# Patient Record
Sex: Male | Born: 1945 | Race: White | Hispanic: No | Smoking: Current every day smoker
Health system: Southern US, Community
[De-identification: ages and names within clinical notes are randomized; demographics above are authoritative.]

## PROBLEM LIST (undated history)

## (undated) DIAGNOSIS — I10 Essential (primary) hypertension: Secondary | ICD-10-CM

## (undated) DIAGNOSIS — D649 Anemia, unspecified: Secondary | ICD-10-CM

## (undated) DIAGNOSIS — Z720 Tobacco use: Secondary | ICD-10-CM

## (undated) DIAGNOSIS — I2510 Atherosclerotic heart disease of native coronary artery without angina pectoris: Secondary | ICD-10-CM

## (undated) DIAGNOSIS — K219 Gastro-esophageal reflux disease without esophagitis: Secondary | ICD-10-CM

## (undated) DIAGNOSIS — K5790 Diverticulosis of intestine, part unspecified, without perforation or abscess without bleeding: Secondary | ICD-10-CM

## (undated) DIAGNOSIS — J449 Chronic obstructive pulmonary disease, unspecified: Secondary | ICD-10-CM

## (undated) DIAGNOSIS — F32A Depression, unspecified: Secondary | ICD-10-CM

## (undated) DIAGNOSIS — Z860100 Personal history of colon polyps, unspecified: Secondary | ICD-10-CM

## (undated) DIAGNOSIS — E875 Hyperkalemia: Secondary | ICD-10-CM

## (undated) DIAGNOSIS — K922 Gastrointestinal hemorrhage, unspecified: Secondary | ICD-10-CM

## (undated) DIAGNOSIS — M549 Dorsalgia, unspecified: Secondary | ICD-10-CM

## (undated) DIAGNOSIS — M419 Scoliosis, unspecified: Secondary | ICD-10-CM

## (undated) DIAGNOSIS — M1990 Unspecified osteoarthritis, unspecified site: Secondary | ICD-10-CM

## (undated) DIAGNOSIS — Z8719 Personal history of other diseases of the digestive system: Secondary | ICD-10-CM

## (undated) DIAGNOSIS — E785 Hyperlipidemia, unspecified: Secondary | ICD-10-CM

## (undated) DIAGNOSIS — F329 Major depressive disorder, single episode, unspecified: Secondary | ICD-10-CM

## (undated) HISTORY — DX: Chronic obstructive pulmonary disease, unspecified: J44.9

## (undated) HISTORY — PX: BACK SURGERY: SHX140

## (undated) HISTORY — DX: Atherosclerotic heart disease of native coronary artery without angina pectoris: I25.10

## (undated) HISTORY — DX: Dorsalgia, unspecified: M54.9

## (undated) HISTORY — PX: CORONARY ANGIOPLASTY: SHX604

## (undated) HISTORY — DX: Anemia, unspecified: D64.9

## (undated) HISTORY — PX: CORONARY ARTERY BYPASS GRAFT: SHX141

## (undated) HISTORY — DX: Essential (primary) hypertension: I10

## (undated) HISTORY — DX: Tobacco use: Z72.0

---
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
## 1971-10-15 HISTORY — PX: FOOT SURGERY: SHX648

## 1997-10-14 HISTORY — PX: CARDIAC CATHETERIZATION: SHX172

## 1998-08-21 ENCOUNTER — Inpatient Hospital Stay (HOSPITAL_COMMUNITY): Admission: EM | Admit: 1998-08-21 | Discharge: 1998-08-23 | Payer: Self-pay | Admitting: Emergency Medicine

## 1998-08-21 ENCOUNTER — Encounter: Payer: Self-pay | Admitting: Internal Medicine

## 2000-12-12 ENCOUNTER — Ambulatory Visit (HOSPITAL_COMMUNITY): Admission: RE | Admit: 2000-12-12 | Discharge: 2000-12-12 | Payer: Self-pay | Admitting: Cardiology

## 2006-01-21 ENCOUNTER — Ambulatory Visit: Payer: Self-pay | Admitting: Pain Medicine

## 2006-02-04 ENCOUNTER — Ambulatory Visit: Payer: Self-pay | Admitting: Pain Medicine

## 2006-02-18 ENCOUNTER — Ambulatory Visit: Payer: Self-pay | Admitting: Pain Medicine

## 2006-03-05 ENCOUNTER — Ambulatory Visit: Payer: Self-pay | Admitting: Physician Assistant

## 2006-05-27 ENCOUNTER — Other Ambulatory Visit: Payer: Self-pay

## 2006-05-27 ENCOUNTER — Emergency Department: Payer: Self-pay | Admitting: Emergency Medicine

## 2006-12-09 ENCOUNTER — Inpatient Hospital Stay (HOSPITAL_COMMUNITY): Admission: EM | Admit: 2006-12-09 | Discharge: 2006-12-10 | Payer: Self-pay | Admitting: Emergency Medicine

## 2006-12-22 ENCOUNTER — Inpatient Hospital Stay (HOSPITAL_BASED_OUTPATIENT_CLINIC_OR_DEPARTMENT_OTHER): Admission: RE | Admit: 2006-12-22 | Discharge: 2006-12-22 | Payer: Self-pay | Admitting: Cardiology

## 2007-03-18 ENCOUNTER — Encounter: Admission: RE | Admit: 2007-03-18 | Discharge: 2007-03-18 | Payer: Self-pay | Admitting: Cardiology

## 2008-10-14 HISTORY — PX: CARDIAC CATHETERIZATION: SHX172

## 2009-01-20 ENCOUNTER — Inpatient Hospital Stay (HOSPITAL_BASED_OUTPATIENT_CLINIC_OR_DEPARTMENT_OTHER): Admission: RE | Admit: 2009-01-20 | Discharge: 2009-01-20 | Payer: Self-pay | Admitting: Cardiology

## 2010-02-15 ENCOUNTER — Inpatient Hospital Stay: Payer: Self-pay | Admitting: Internal Medicine

## 2010-04-01 ENCOUNTER — Inpatient Hospital Stay: Payer: Self-pay | Admitting: Internal Medicine

## 2010-04-19 ENCOUNTER — Ambulatory Visit: Payer: Self-pay | Admitting: Gastroenterology

## 2010-05-09 ENCOUNTER — Ambulatory Visit: Payer: Self-pay | Admitting: Internal Medicine

## 2010-06-25 ENCOUNTER — Ambulatory Visit: Payer: Self-pay | Admitting: Cardiology

## 2010-09-16 IMAGING — CT CT HEAD WITHOUT CONTRAST
2 of 4 series · 16 of 30 positions shown, 19 images · non-contrast
Comparison: none

REASON FOR EXAM: lethargy; HA
COMMENTS:

PROCEDURE:     CT  - CT HEAD WITHOUT CONTRAST  - April 01, 2010 [DATE]
RESULT:     Technique: Helical 5mm sections were obtained from the skull
base to the vertex without administration of intravenous contrast.

[Series 2: without · axial · non-contrast · 0.43mm/px · z∈[-91,+29]mm · 9 of 32 slices shown, 12 images]
[im 4/32  brain]
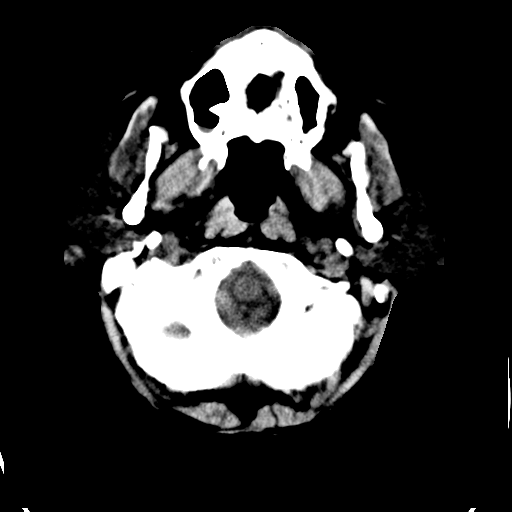
[im 4/32  bone]
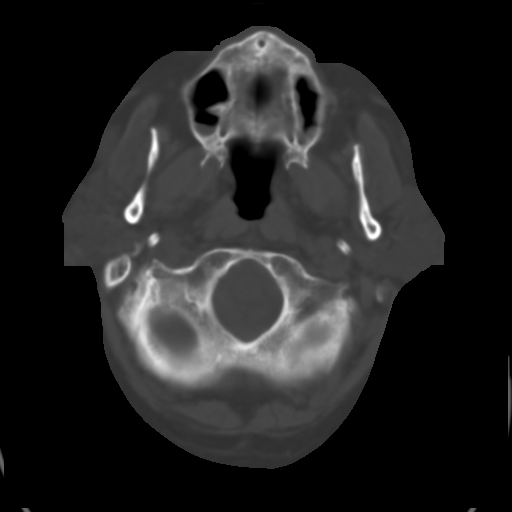
[im 7/32  brain]
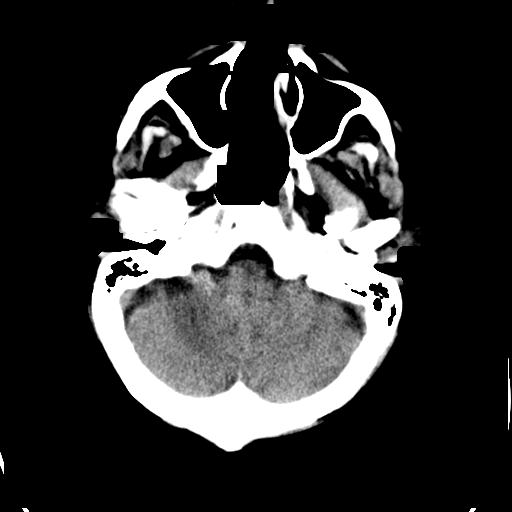
[im 10/32  brain]
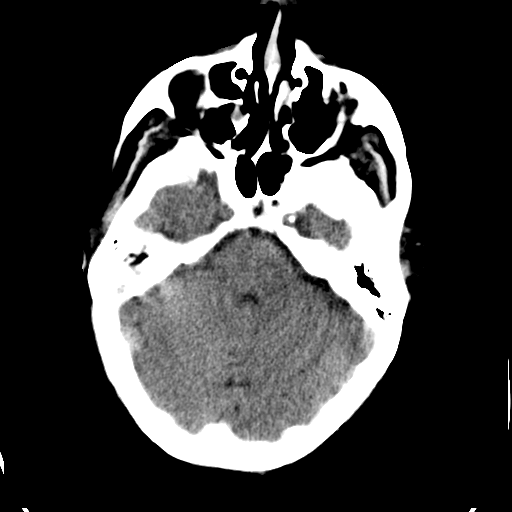
[im 13/32  brain]
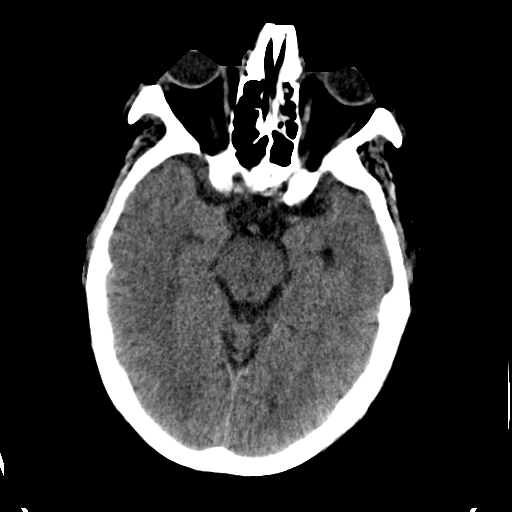
[im 16/32  brain]
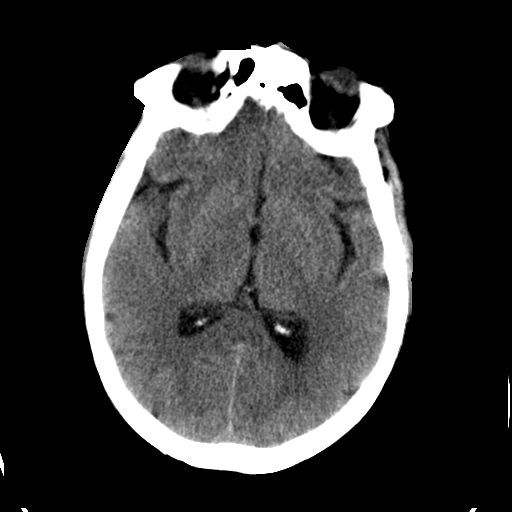
[im 16/32  bone]
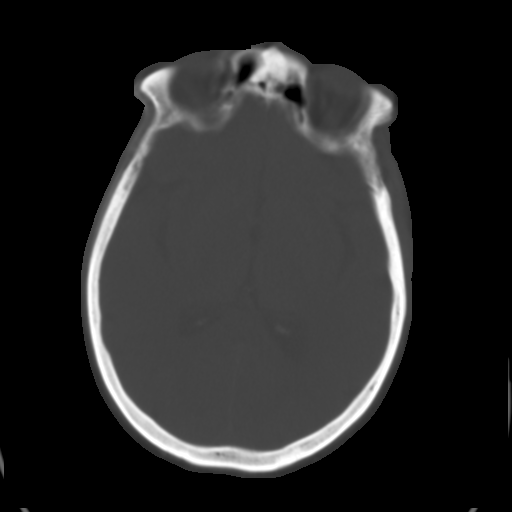
[im 19/32  brain]
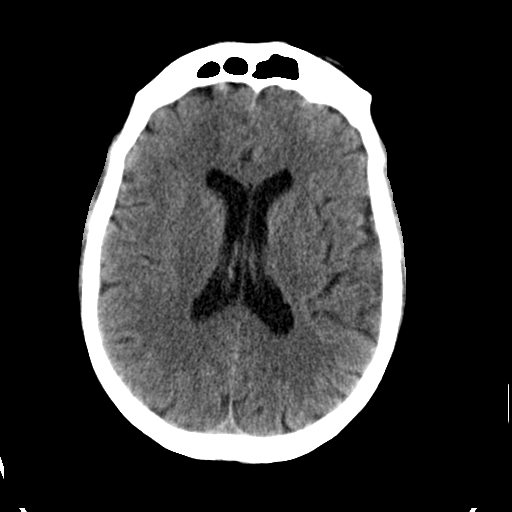
[im 22/32  brain]
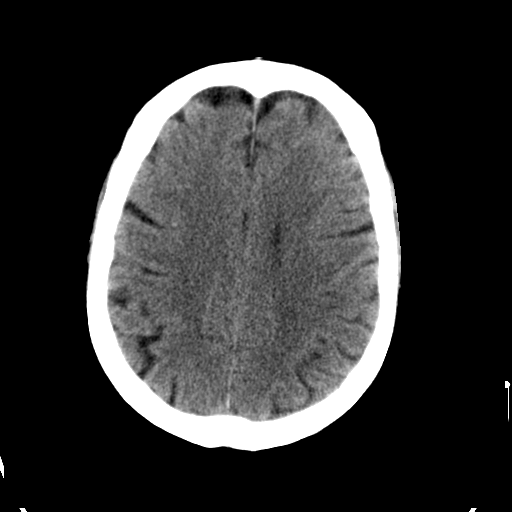
[im 25/32  brain]
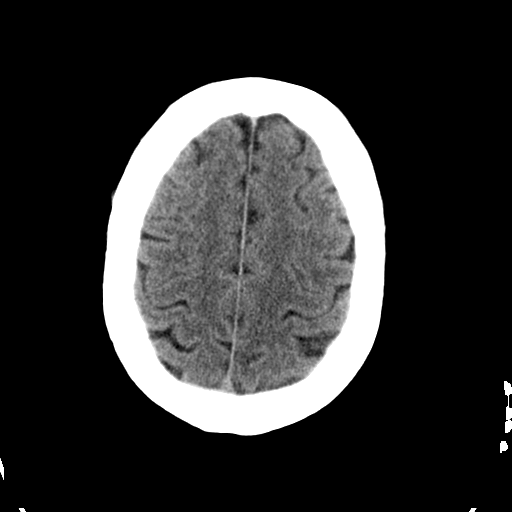
[im 28/32  brain]
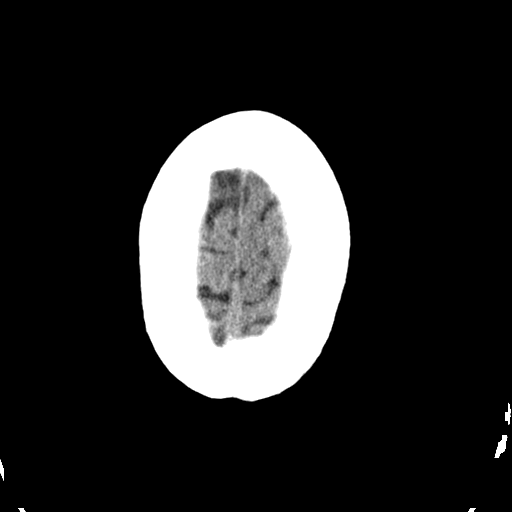
[im 28/32  bone]
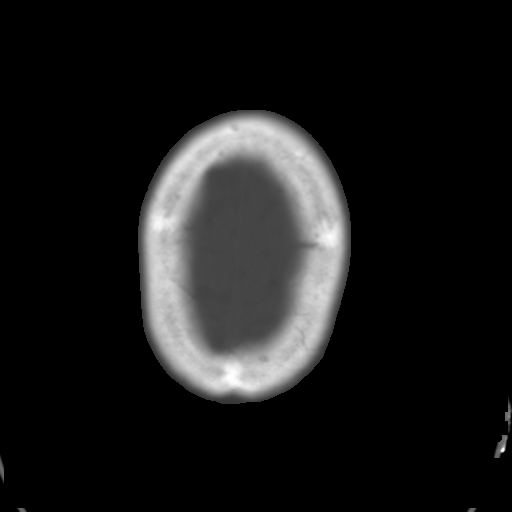

[Series 3: bone · axial · 0.43mm/px · z∈[-91,+14]mm · 7 of 32 slices shown]
[im 4/32  bone]
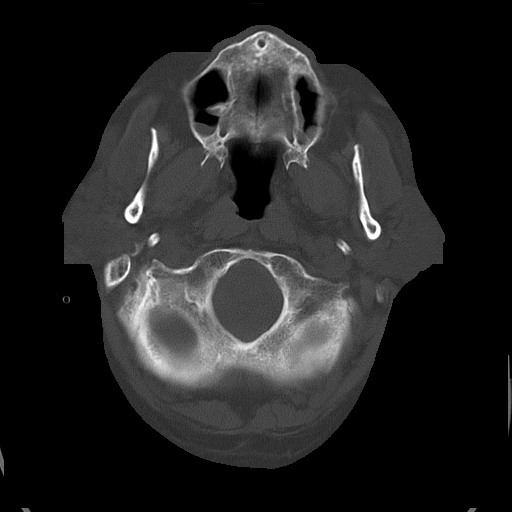
[im 7/32  bone]
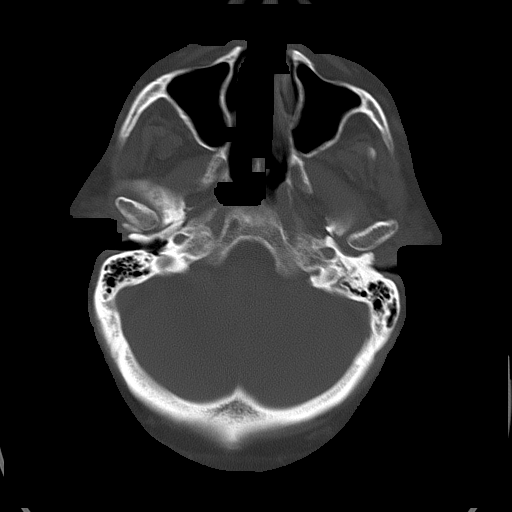
[im 10/32  bone]
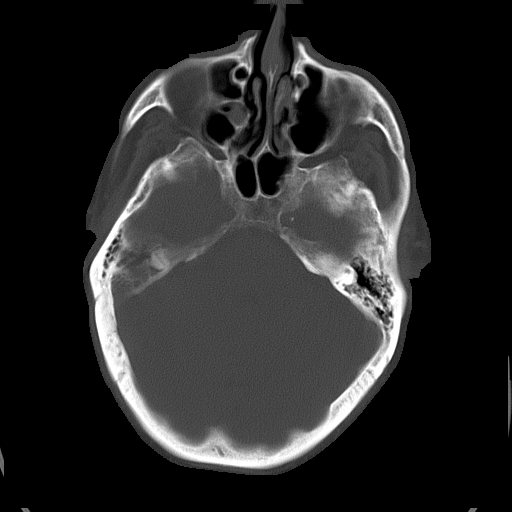
[im 13/32  bone]
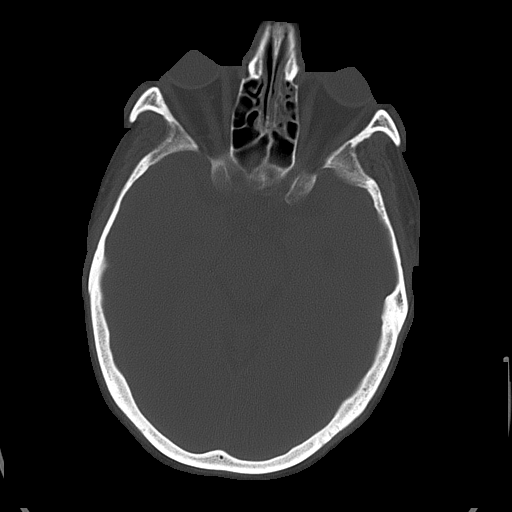
[im 19/32  bone]
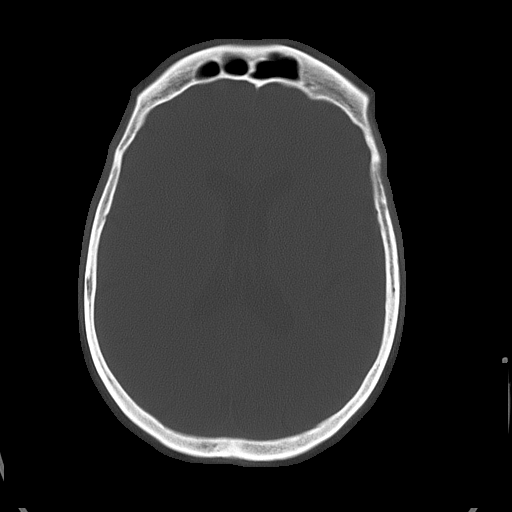
[im 22/32  bone]
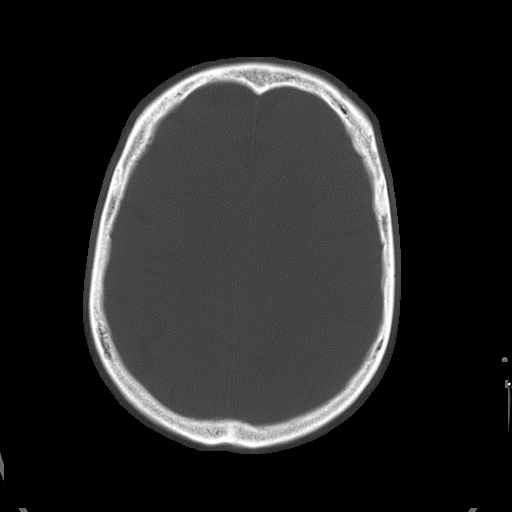
[im 25/32  bone]
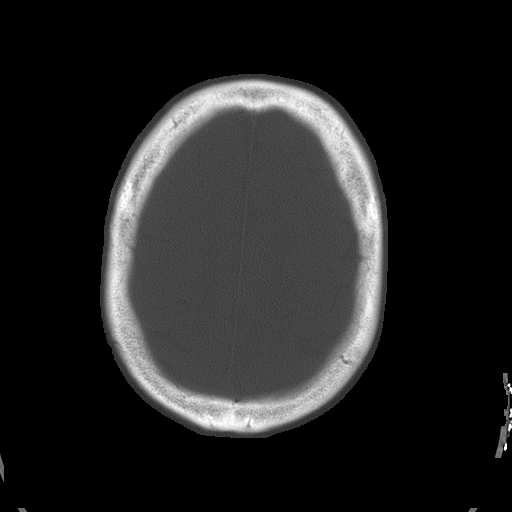

[16 of 30 positions shown; findings below may reference images not displayed]

FINDINGS: There is not evidence of intra-axial fluid collections. There is
no evidence of acute hemorrhage or secondary signs reflecting mass effect or
subacute or chronic focal territorial infarction. The osseous structures
demonstrate no evidence of a depressed skull fracture. If there is
persistent concern clinical follow-up with MRI is recommended.
IMPRESSION: 1. No evidence of acute intracranial abnormalitites.

## 2010-10-04 ENCOUNTER — Ambulatory Visit: Payer: Self-pay | Admitting: Cardiology

## 2010-10-24 IMAGING — CT CT CHEST W/ CM
1 of 2 series · 14 of 29 positions shown, 18 images · IV contrast (agent unspecified)
Comparison: 02/21/2010

REASON FOR EXAM: SOB   Pneumonia   follow up
COMMENTS:

PROCEDURE:     KCT - KCT CHEST WITH CONTRAST  - May 09, 2010 [DATE]
RESULT:     Indication: Shortness of breath, pneumonia
TECHNIQUE: Multiple axial images of the chest are obtained with 75 mL of
Ssovue-ZMV intravenous contrast.

[Series 2: chest w/ 5.0 i41f · axial · 0.81mm/px · z∈[-246,+58]mm · 14 of 73 slices shown, 18 images]
[im 6/73  mediastinal]
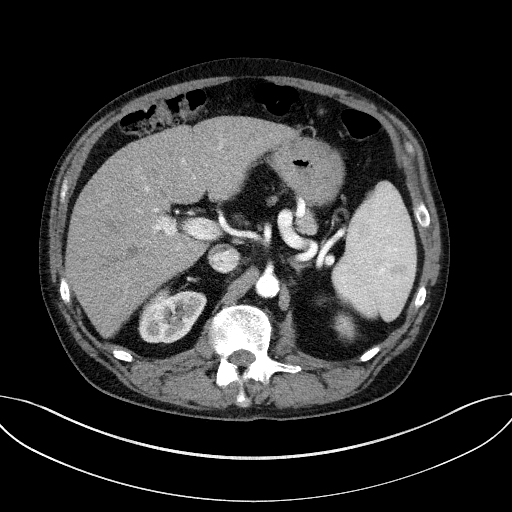
[im 6/73  lung]
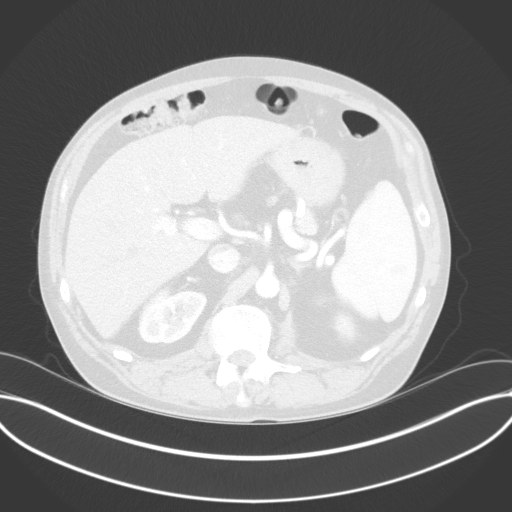
[im 11/73  lung]
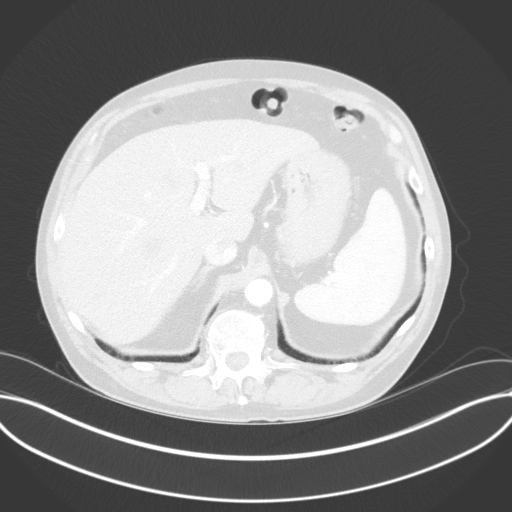
[im 16/73  lung]
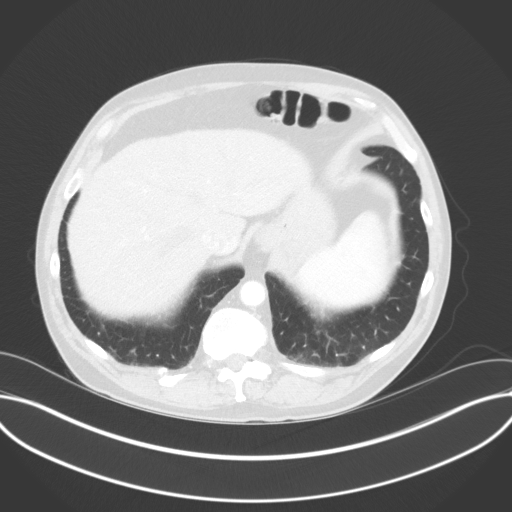
[im 21/73  lung]
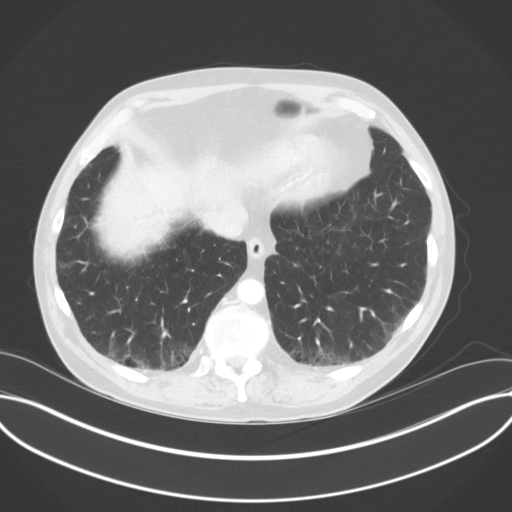
[im 26/73  mediastinal]
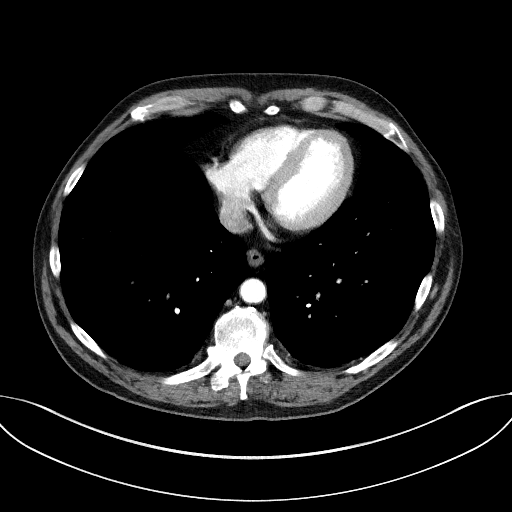
[im 26/73  lung]
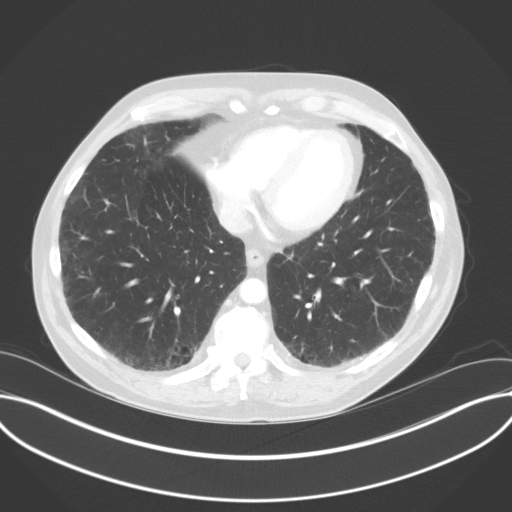
[im 31/73  lung]
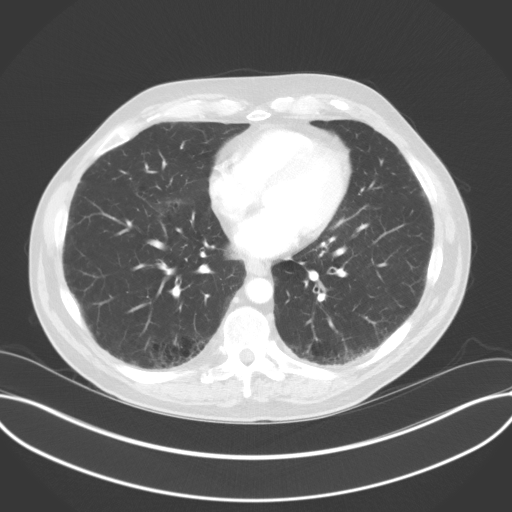
[im 35/73  lung]
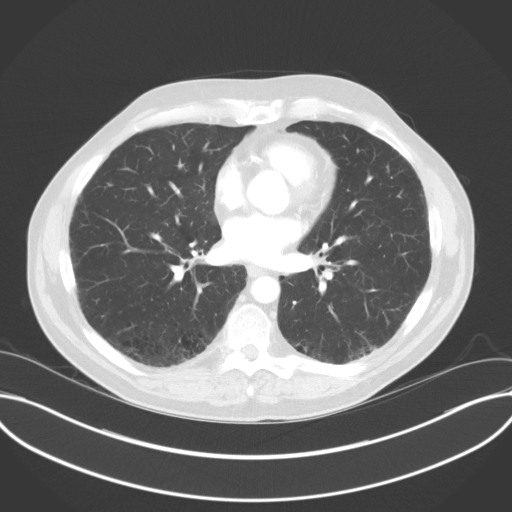
[im 37/73  lung]
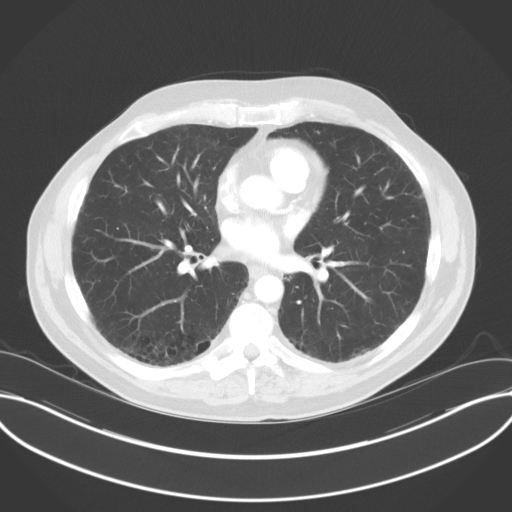
[im 42/73  mediastinal]
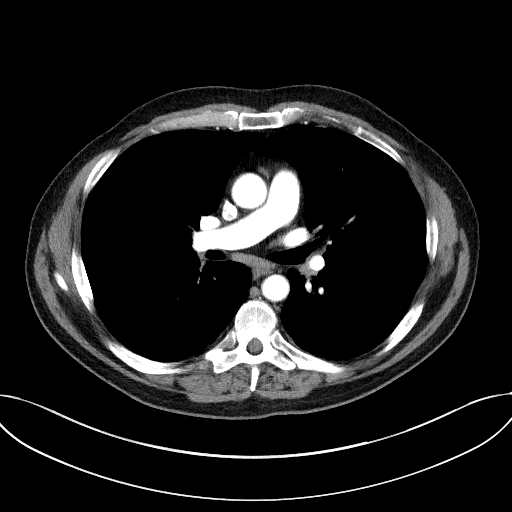
[im 42/73  lung]
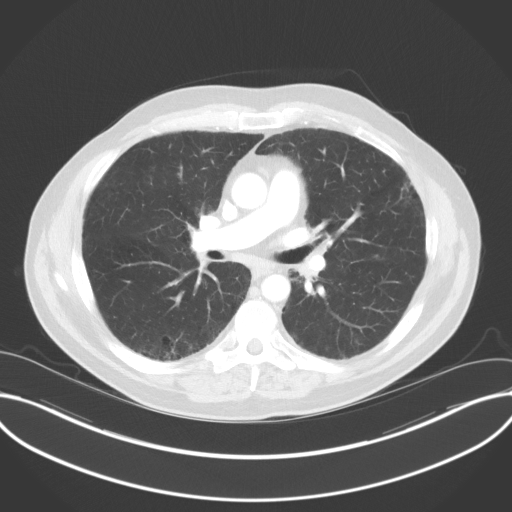
[im 47/73  lung]
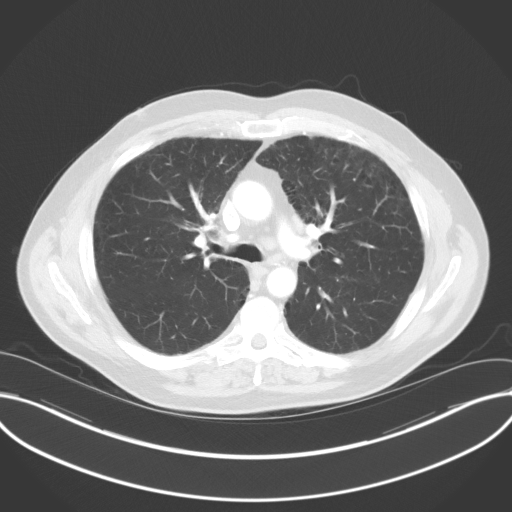
[im 52/73  lung]
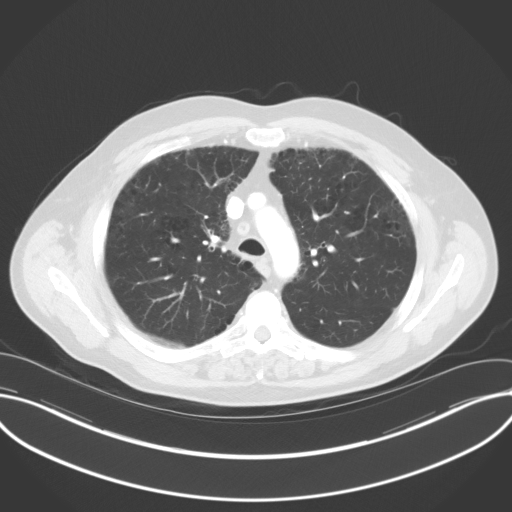
[im 57/73  lung]
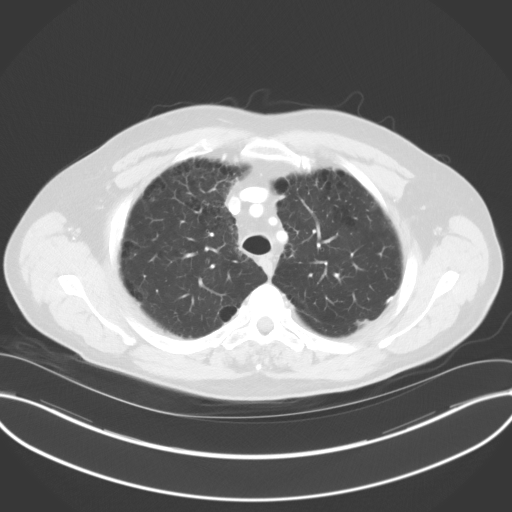
[im 62/73  mediastinal]
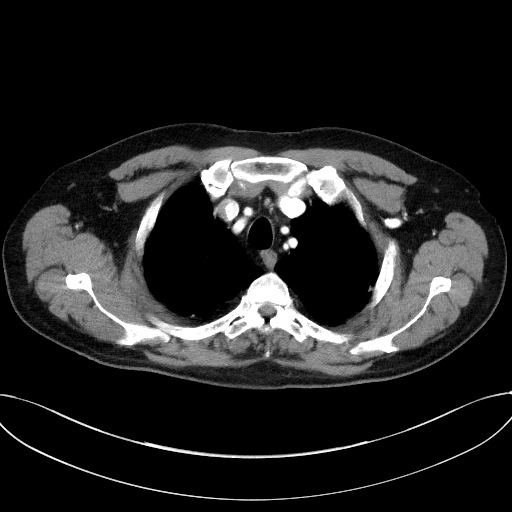
[im 62/73  lung]
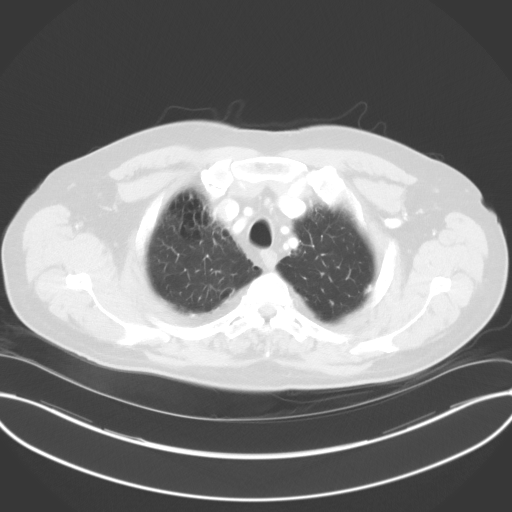
[im 67/73  lung]
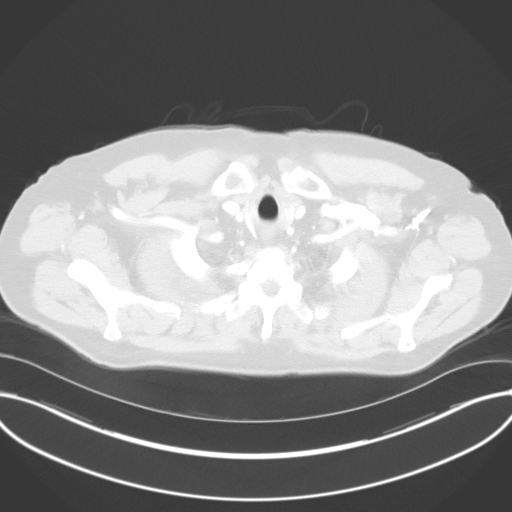

[14 of 29 positions shown; findings below may reference images not displayed]

FINDINGS: The central airways are patent. There are bibasilar posterior subpleural
cystic changes. There are biapical blebs noted. There is bilateral mild
interlobular and intralobular septal thickening. There is no pleural
effusion or pneumothorax.

There multiple nonpathologic enlarged mediastinal lymph nodes which are
improved compared with the prior exam.

The heart size is normal. There is no pericardial effusion. The thoracic
aorta is normal in caliber.  There is coronary artery atherosclerosis
involving the left main, LAD and right coronary artery.

Review of bone windows demonstrates no focal lytic or sclerotic lesions.
There is an S-shaped scoliosis of the thoracolumbar spine.

Limited noncontrast images of the upper abdomen were obtained. The adrenal
glands appear normal. The remainder of the visualized abdominal organs are
unremarkable.
IMPRESSION: 1. Mild chronic interstitial lung disease.
2. No focal consolidation to suggest pneumonia.

## 2010-11-22 ENCOUNTER — Other Ambulatory Visit (HOSPITAL_COMMUNITY): Payer: Self-pay | Admitting: Neurological Surgery

## 2010-11-22 DIAGNOSIS — M545 Low back pain: Secondary | ICD-10-CM

## 2010-11-30 ENCOUNTER — Ambulatory Visit (HOSPITAL_COMMUNITY)
Admission: RE | Admit: 2010-11-30 | Discharge: 2010-11-30 | Disposition: A | Discharge status: Home / Self Care | Payer: Medicare Other | Source: Ambulatory Visit | Attending: Neurological Surgery | Admitting: Neurological Surgery

## 2010-11-30 ENCOUNTER — Other Ambulatory Visit (HOSPITAL_COMMUNITY): Payer: Self-pay | Admitting: Neurological Surgery

## 2010-11-30 DIAGNOSIS — M545 Low back pain: Secondary | ICD-10-CM

## 2010-11-30 DIAGNOSIS — M4120 Other idiopathic scoliosis, site unspecified: Secondary | ICD-10-CM | POA: Insufficient documentation

## 2010-11-30 DIAGNOSIS — M48061 Spinal stenosis, lumbar region without neurogenic claudication: Secondary | ICD-10-CM | POA: Insufficient documentation

## 2010-11-30 MED ORDER — IOHEXOL 180 MG/ML  SOLN
20.0000 mL | Freq: Once | INTRAMUSCULAR | Status: AC | PRN
  Administered 2010-11-30: 20 mL via INTRAVENOUS

## 2011-02-05 ENCOUNTER — Other Ambulatory Visit: Payer: Self-pay | Admitting: Cardiology

## 2011-02-05 DIAGNOSIS — F329 Major depressive disorder, single episode, unspecified: Secondary | ICD-10-CM

## 2011-02-05 DIAGNOSIS — K219 Gastro-esophageal reflux disease without esophagitis: Secondary | ICD-10-CM

## 2011-02-05 NOTE — Telephone Encounter (Signed)
Out of Nexipro and Lexum and needs refills. [facility] [place]. Also wants know who the new doctor His back has been hurting (has a s-curvature) and has had intense headaches, has been maxing out 4000mg  of tylenol and is concerned about over working his liver. Please call back. I have pulled the chart.

## 2011-02-06 ENCOUNTER — Telehealth: Payer: Self-pay | Admitting: Cardiology

## 2011-02-06 MED ORDER — ESCITALOPRAM OXALATE 20 MG PO TABS: 20.0000 mg | ORAL_TABLET | Freq: Every day | ORAL | Status: DC

## 2011-02-06 MED ORDER — ESOMEPRAZOLE MAGNESIUM 40 MG PO CPDR: 40.0000 mg | DELAYED_RELEASE_CAPSULE | Freq: Every day | ORAL | Status: DC

## 2011-02-06 NOTE — Telephone Encounter (Signed)
Spoke with Dr. Deborah Chalk regarding pt trying something else other than Tylenol for back pain.  Dr. Deborah Chalk suggested pt try Tramadol.  RN left message for pt to try Tramadol and to get it from Dr. Danielle Dess.  Pt to call with any concerns.

## 2011-02-06 NOTE — Telephone Encounter (Signed)
RN clarified instructions with pharmacy.

## 2011-02-06 NOTE — Telephone Encounter (Signed)
NEEDS TO  QUAINTY 30 AND DAYS SUPPLY IS 90 NEEDS TO KNOW WHICH IT IS FAX [phone]

## 2011-02-06 NOTE — Telephone Encounter (Signed)
Spoke with pt, refills sent in.

## 2011-02-06 NOTE — Telephone Encounter (Signed)
Returning call regarding scripts

## 2011-02-06 NOTE — Telephone Encounter (Signed)
RN refilled Nexium and Lexapro for pt.  Pt aware.

## 2011-02-26 NOTE — H&P (Signed)
NAMESHARMARKE, CICIO                ACCOUNT NO.:  [id number]   MEDICAL RECORD NO.:  [id number]           PATIENT TYPE:   LOCATION:                                 FACILITY:   PHYSICIAN:  Colleen Can. Deborah Chalk, M.D.    DATE OF BIRTH:   DATE OF ADMISSION:  01/20/2009  DATE OF DISCHARGE:                              HISTORY & PHYSICAL   CHIEF COMPLAINT:  Chest pain.   HISTORY OF PRESENT ILLNESS:  [name] is a 65 year old white male who has a  known history of ischemic heart disease.  He presents for repeat cardiac  catheterization due to a severe episode of chest pain this past Sunday.  He does have known ischemic heart disease with mild left main stenosis.   He was seen as a work-in appointment on January 17, 2009 and at that time  reported on the previous Easter Sunday at about 12:30 in the afternoon,  he had significant chest pain.  It radiated up into the tongue as well  as into his teeth.  His left arm went numb.  He took aspirin.  He did  not have any nitroglycerin.  This was associated with feeling queasy.  He had no actual vomiting.  He was sweaty and clammy.  His daughter  notes that he was a little disoriented.  He basically went to bed and  slept for about 20 hours.  He has had no recurrence since that time.  He  now presents for repeat cardiac catheterization.   PAST MEDICAL HISTORY:  1. Known ischemic heart disease.  He has had previous stent placement      to the right coronary in November 1999 and has a known residual      left main disease.  His last catheterization was in March 2008      which showed normal LV function, patent stent in the right      coronary, and mild distal left main stenosis with mild disease of      small left anterior descending.  2. Ongoing tobacco abuse.  3. Hyperlipidemia.  4. Hypertension.  5. Scoliosis with chronic back pain requiring chronic narcotics.  6. History of an abnormal CT scan with subsequent resolution in 2008.  7. History of  appendectomy.  8. Left foot surgery.   ALLERGIES:  None known.   CURRENT MEDICATIONS:  1. Zocor 40 mg a day.  2. Aspirin daily.  3. Diovan 320/25 daily.  4. Nexium 40 mg a day.  5. Lexapro 20 mg a day.  6. Percocet up to 6-8 tablets a day.  7. Inhalers daily.   FAMILY HISTORY:  His father died at 74 due to Alzheimer's and there was  a questionable history of unstable angina.   SOCIAL HISTORY:  He is divorced.  He has 3 daughters.  He is smoking  about 2 packs cigarettes a day.  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   REVIEW OF SYSTEMS:  He is not really sure if he has had any previous  episodes of chest pain prior to this last episode.  He has significant  back pain and does require chronic narcotics.  He tries to stay active.  He has had no recent fever, flu, or cough.  He does continue to smoke.  He notes his bowel habits are unremarkable.  GU system is unremarkable.  All other review of systems are negative.   PHYSICAL EXAMINATION:  GENERAL:  He is pleasant.  He is in no acute  distress.  He is currently pain free.  VITAL SIGNS:  His blood pressure is 138/76 sitting and standing.  Heart  rate is 62 and regular.  His weight is down 10 pounds to 166.  SKIN:  Warm and dry.  Color is unremarkable.  HEENT:  He is normocephalic and atraumatic.  Pupils are equal, round,  and reactive to light.  Conjunctivae are normal.  NECK:  Supple.  No masses.  No JVD.  LUNGS:  Coarse.  He is not overtly short of breath at rest.  CARDIAC:  Regular rhythm.  He has no chest wall tenderness.  ABDOMEN:  Soft, positive bowel sounds, and nontender.  EXTREMITIES:  Without edema.  MUSCULOSKELETAL:  Gait, strength, and range of motion to be intact.  NEUROLOGIC:  No gross focal deficits.   LABORATORY DATA:  His EKG shows sinus rhythm with no acute changes.   Other labs are pending.   OVERALL IMPRESSION:  1. Chest pain.  2. Known  ischemic heart disease.  3. Ongoing tobacco abuse.  4. Hyperlipidemia.  5. Hypertension.   PLAN:  We will proceed on with diagnostic cardiac catheterization.  The  procedure has been reviewed in full detail and he is willing to proceed  on Friday January 20, 2009.      Sharlee Blew, N.P.      Colleen Can. Deborah Chalk, M.D.  Electronically Signed    LC/MEDQ  D:  01/18/2009  T:  01/18/2009  Job:  [id number]

## 2011-02-26 NOTE — Cardiovascular Report (Signed)
Matthew, Booth                ACCOUNT NO.:  [id number]   MEDICAL RECORD NO.:  [id number]          PATIENT TYPE:  OIB   LOCATION:  1961                         FACILITY:  [facility]   PHYSICIAN:  Colleen Can. Deborah Chalk, M.D.DATE OF BIRTH:  12/05/1945   DATE OF PROCEDURE:  DATE OF DISCHARGE:  01/20/2009                            CARDIAC CATHETERIZATION   Mr. Fails presents with a severe episode of substernal chest pain.  Cardiac enzymes were negative and it was relatively short lived, but it  was severe, radiating to his left arm.  He has had a previous stent in  his right coronary artery and had known mild left main coronary artery  stenosis, has residual.  He is referred for evaluation.   PROCEDURE:  Left heart catheterization with selective coronary  angiography and left ventricular angiography.   TYPE AND SITE OF ENTRY:  Percutaneous right femoral artery.   CATHETERS:  A 4-French 4 curved Judkins left coronary catheter, 4-French  3-D RC right coronary catheter, 4-French pigtail ventriculographic  catheter.   CONTRAST MATERIAL:  Omnipaque.   MEDICATIONS:  Given prior to procedure, Valium 10 mg p.o.   MEDICATIONS:  Given during the procedure, Versed 3 mg IV.   COMMENTS:  The patient tolerated the procedure well.   HEMODYNAMIC DATA:  The aortic pressure was 102/53.  The LV pressure was  99/9.  There is no aortic valve gradient noted on pullback.   ANGIOGRAPHIC DATA:  Left ventricular angiogram was performed in RAO  position.  Overall cardiac size and silhouette are normal.  The global  ejection fraction was 60%.  Regional wall motion is normal.   CORONARY ARTERIES:  1. Right coronary artery.  The right coronary artery is a large      dominant vessel.  There is a stent that is patent proximally.      There are irregularities of 30% or less scattered throughout the      right coronary artery, but there is no narrowing within the body of      the stent.  2. Left circumflex is  normal.  3. There is a small intermediate.  It is normal.  4. Left anterior descending.  The left anterior descending had mild      irregularities and somewhat diffuse 20-30% narrowings.  5. Left main coronary artery.  The left main coronary artery had a      definite 30-40% distal narrowing.  Numerous views of this were      obtained, and it is clear that it has not progressed beyond that      point and does not appear to have significant obstructive quality      to it.   OVERALL IMPRESSION:  1. Mild left main coronary artery distal stenosis.  2. There is mild irregularities and right coronary artery left      anterior descending.  3. Patent stent in the right coronary artery.  4. Normal left ventricular function.   DISCUSSION:  We will encourage [name] to modify his cardiovascular risk  factors.  I do not think he needs an operative procedure at  this point  in time.  I do want to arrange for him to have a followup stress test  and make sure that there is not any functional ischemia related to this  left main coronary artery stenosis that appears mild angiographically.      Colleen Can. Deborah Chalk, M.D.  Electronically Signed     SNT/MEDQ  D:  01/20/2009  T:  01/21/2009  Job:  [id number]

## 2011-03-01 NOTE — H&P (Signed)
[place]  Patient:    [name], [name]                      MRN: [id number] Adm. Date:  12/12/00 Dictator:   Jennet Maduro. Earl Gala, R.N., A.N.P. CCH[facility]ah Pais. Jeannetta Nap, M.D.                         History and Physical  CHIEF COMPLAINT:  Fatigue.  HISTORY OF PRESENT ILLNESS:  Mr. Senne is a very pleasant 65 year old white male who has known atherosclerotic cardiovascular disease.  He has had previous cardiac catheterization with revascularization dating back to November 1999.  At that time he demonstrated a totally occluded right coronary artery with left-to-right collaterals and subsequently had a stent placed.  He has a small left circumflex with a distal left main coronary stenosis of approximately 50% as well as a 60-70% mid narrowing in the LAD.  Following that hospitalization, he has basically done well.  He has done considerable cardiovascular modification and has stopped smoking.  He remains active.  He has no reoccurrence of throat discomfort or chest pain.  He was seen earlier this month for routine treadmill testing.  At that time, he exercised a total of 10 minutes to a maximum of 4.2 miles per hour at 16% grade.  His heart rate increased to 154.  Blood pressure rose to a maximum of 170/60, and the test was stopped due to fatigue, and there was no clinical angina.  His resting electrocardiogram was basically within normal limits.  He did have ST segment depression in the lateral leads with somewhat of upsloping with exercise and it became somewhat flattened at the very peak of exercise and then recovered quite promptly following exercise.  Within one minute post recovery the ST changes were completely back to baseline.  Initially, it was felt that we would follow him along medically.  He has had ongoing bouts of fatigue and, however, due to the fact that he has not had recurrence of chest pain but has a known left main stenosis,  will now elect to proceed on with elective cardiac catheterization.  PAST MEDICAL HISTORY: 1. ASCVD with previous stent placement to the right coronary artery in    November 1999. 2. Past tobacco abuse. 3. Hypercholesterolemia, currently on Zocor. 4. Status post appendectomy. 5. History of left foot surgery.  ALLERGIES:  None known.  CURRENT MEDICATIONS: 1. Zocor 40 mg a day. 2. Aspirin daily.  FAMILY HISTORY:  Father died at 15 due to Alzheimers and there was a questionable history of unstable angina.  SOCIAL HISTORY:  He is currently undergoing divorce proceedings.  He has three daughters.  He has no current smoking history but previously smoked one to one-and-a-half packs a day for approximately 25 years.  There is occasional alcohol use.  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  REVIEW OF SYSTEMS:  Is otherwise as stated above and is unremarkable in its entirety.  PHYSICAL EXAMINATION:  GENERAL:  He is a very pleasant white male in no acute distress.  VITAL SIGNS:  Blood pressure 150/80 sitting, 140/90 standing.  Heart rate is 80 and regular.  Respirations 18.  Weight 162 pounds.  SKIN:  Warm and dry.  Color is unremarkable.  HEENT:  Unremarkable.  LUNGS:  Clear.  HEART:  Shows  a regular rhythm.  ABDOMEN:  Soft, positive bowel sounds, nontender.  EXTREMITIES:  Show no evidence of edema.  PULSES:  Distal pulses are intact bilaterally.  LABORATORY DATA:  Currently pending.  OVERALL IMPRESSION: 1. Known left main stenosis. 2. Previous abnormal exercise tolerance test yet with good exercise tolerance    and clinically negative for symptoms. 3. Hypercholesterolemia. 4. Past tobacco use.  PLAN:  Will proceed on with elective cardiac catheterization.  The procedure is discussed in full detail with the patient and he is willing to proceed on. DD:  12/10/00 TD:  12/10/00 Job: [id number]

## 2011-03-01 NOTE — Cardiovascular Report (Signed)
NAMEDIANGELO, RADEL                ACCOUNT NO.:  [id number]   MEDICAL RECORD NO.:  [id number]          PATIENT TYPE:  OIB   LOCATION:  1963                         FACILITY:  [facility]   PHYSICIAN:  Colleen Can. Deborah Chalk, M.D.DATE OF BIRTH:  11-Jun-1946   DATE OF PROCEDURE:  12/22/2006  DATE OF DISCHARGE:                            CARDIAC CATHETERIZATION   PROCEDURE:  Left heart catheterization with selective coronary  angiography, left ventricular angiography.  Type and site of entry:  Percutaneous right femoral artery. Catheters: 4-French, 4 curved Judkins  right left coronary catheters, 4-French pigtail ventriculographic  catheter.   CONTRAST MATERIAL:  Omnipaque.   MEDICATIONS PRIOR TO PROCEDURE:  Valium 10 mg.   MEDICATIONS DURING PROCEDURE:  Versed 2 mg IV.   COMMENT:  The patient tolerated the procedure well.   HEMODYNAMIC DATA:  The aortic pressure is 121/60, LV was 129/0-7.   Angiographic data:  The left ventricular angiogram was performed in RAO  position.  Overall cardiac size and silhouette were normal.  The global  left ventricular function is normal.  There was only trace mitral  regurgitation, if that.  The ejection fraction is estimated be 60%.   Coronary arteries arise and distribute normally.  The left coronary  system in general is small.  1. His left main coronary artery has 20-30% distal narrowing.  2. The left anterior descending.  The left anterior descending ends on      anterior wall.  There is a second and third diagonal present.      There is 30% narrowing after the second diagonal vessel.  3. Left circumflex:  The left circumflex is relatively small.  It is      essentially normal.  4. Right coronary artery.  The right coronary artery is a large      vessel.  The stent in the proximal right coronary artery is widely      patent.  There are only minor irregularities in the right coronary      artery.   OVERALL IMPRESSION:  1. Normal left ventricular  function.  2. Persistent patency of the stent in the right coronary artery.  3. There is mild distal left main coronary stenosis with mild disease      in a small left anterior descending vessel.   DISCUSSION:  Overall, I think that Orhan is stable from a cardiac  standpoint.  I will continue to ask him to modify cardiovascular risk  factors, including smoking cessation.  I think that his left system is  small enough, and the left main coronary artery is mild enough, that no  revascularization procedure needs to be done at this point in time.      Colleen Can. Deborah Chalk, M.D.  Electronically Signed     SNT/MEDQ  D:  12/22/2006  T:  12/22/2006  Job:  [id number]

## 2011-03-01 NOTE — H&P (Signed)
[name], [name]                ACCOUNT NO.:  [id number]   MEDICAL RECORD NO.:  [id number]          PATIENT TYPE:  EMS   LOCATION:  [facility]                         FACILITY:  [facility]   PHYSICIAN:  Ulyses Amor, MD DATE OF BIRTH:  December 04, 1945   DATE OF ADMISSION:  12/08/2006  DATE OF DISCHARGE:                              HISTORY & PHYSICAL   [name] is a 65 year old white man who is admitted to [facility] for further evaluation of chest pain.   The patient has a history of coronary artery disease which dates back to  1999.  He underwent stent implantation at that time.  He has no history  of congestive heart failure or arrhythmia.   The patient presented to the emergency department this evening after  experiencing chest pain which began this morning at approximately 10  a.m.  He was working in the [employer] at the time.  The chest pain  was described as a pressure located in a focal area along the left  parasternal border in the fourth intercostal space.  It was associated  with a fullness in his throat.  He noted diaphoresis, but no dyspnea or  nausea.  There were no exacerbating or ameliorating factors.  It  appeared to be unrelated to position, activity, meals, or respirations.  This discomfort continued over the ensuing hours in an intermittent  fashion, lasting several minutes, resolving for several minutes, and  then recurring again for several minutes.  He did not take any  nitroglycerin.  However, he was given nitroglycerin by EMS which seemed  to improve his chest pain.  His chest pain was resolved by the time that  he had arrived in the emergency department.  As noted, the total  duration of chest pain was approximately 8 hours.  The patient believes  that this chest pain was similar in quality to that which preceded his  stent.   The patient has a number of risk factors for coronary artery disease  including hypertension, dyslipidemia, smoking (he  continues to smoke 1  to 1-1/2 packs of cigarettes per day) and family history (father).  There is no history of diabetes mellitus.   PAST MEDICAL HISTORY:  Otherwise unremarkable.   MEDICATIONS:  Aspirin, Diovan, Lexapro, and Nexium.   ALLERGIES:  No known drug allergies.   PAST SURGICAL HISTORY:  Appendectomy.   SIGNIFICANT INJURIES:  None.   SOCIAL HISTORY:  The patient is divorced.  He lives with his mother.  He  smokes as described above.  He drinks approximately one six-pack of beer  each week.  He does not use illicit drugs.   REVIEW OF SYSTEMS:  No new problems related to his head, eyes, ears,  nose, mouth, throat, lungs, gastrointestinal system, genitourinary  system, or extremities.  There was no history of neurologic or  psychiatric disorder.  There is no history of fever or weight loss.  Specifically, he reports no cough, sputum production, chest congestion,  dyspnea, dysuria, frequency, or urgency.  He reports no abdominal pain,  nausea, or vomiting.   PHYSICAL EXAMINATION:  VITAL SIGNS:  Blood pressure 136/84, pulse 97 and  regular, respirations 23.  His temperature was initially 102.5 upon  presentation to the emergency department, and with acetaminophen it fell  to 97.7.  GENERAL:  The patient is a middle-aged white man in no discomfort.  He  is alert, oriented, and appropriate.  HEENT:  Normal.  NECK:  Without thyromegaly or adenopathy.  Carotid pulses were palpable  bilaterally and without bruits.  HEART:  Normal S1 and S2.  There was no S3, S4, murmur, rub, or click.  Cardiac rhythm was regular.  No chest wall tenderness was noted.  LUNGS:  Clear.  ABDOMEN:  Slight mid epigastric tenderness.  There was no mass,  hepatosplenomegaly, distention, rebound, guarding, or rigidity.  Bowel  sounds were normal.  RECTAL:  GENITOURINARY:  Not performed as they were not pertinent to the  reason for acute care hospitalization.  EXTREMITIES:  Without edema, deviation,  or deformity.  Radial and  dorsalis pedal pulses were palpable bilaterally.  NEUROLOGY:  Brief screening neurologic survey was unremarkable.   The chest radiograph, according to the radiologist, demonstrated a  possible right mid lung density.  The initial set of cardiac markers  revealed a myoglobin of 104, CK-MB less than 1.0, and troponin less than  0.05.  The second set of cardiac markers revealed a myoglobin of 70.4,  CK-MB less than 1.0, and troponin less than 0.05.  BUN was 11,  creatinine 1.4, and potassium 4.0.  White count was 8.8 with a  hemoglobin of 13.8 and hematocrit of 38.5.  AST was 18, and ALT 22.  Total bilirubin was 0.6.  The electrocardiogram was normal.   The remaining studies were pending at the time of this dictation.   IMPRESSION:  1. Chest pain; rule out unstable angina.  2. Coronary artery disease.  Status post stent in 1999.  3. Hypertension.  4. Dyslipidemia.  5. Fever (102.5) upon presentation; resolved with acetaminophen.  6. Slight epigastric tenderness.  7. Right mid lung density.   PLAN:  1. Telemetry.  2. Serial cardiac enzymes.  3. Aspirin.  4. Intravenous heparin.  5. Intravenous nitroglycerin.  6. Blood cultures.  7. Urinalysis.  8. Urine culture and sensitivity.  9. Discontinuation of smoking discussed with the patient.  10.Chest CT (to follow up lung density) and abdominal CT (to exclude      cholelithiasis).  11.Further measures per Colleen Can. Deborah Chalk, M.D.      Ulyses Amor, MD  Electronically Signed     MSC/MEDQ  D:  12/08/2006  T:  12/09/2006  Job:  [id number]   cc:   Colleen Can. Deborah Chalk, M.D.

## 2011-03-01 NOTE — H&P (Signed)
[name], [name]                ACCOUNT NO.:  [id number]   MEDICAL RECORD NO.:  [id number]           PATIENT TYPE:   LOCATION:                                 FACILITY:   PHYSICIAN:  Colleen Can. Deborah Chalk, M.D.    DATE OF BIRTH:   DATE OF ADMISSION:  12/22/2006  DATE OF DISCHARGE:                              HISTORY & PHYSICAL   CHIEF COMPLAINT:  Fatigue.   HISTORY OF PRESENT ILLNESS:  [name] is a very pleasant 65 year old white  male who has a known history of ischemic heart disease.  He was admitted  toward the latter part of February with fever and chest pain.  He had an  abnormal chest x-ray as well as CT scan and was subsequently treated for  pneumonia.  He does have known ischemic heart disease with left main  stenosis and has had his last nuclear stress test in December 2006.  He  was discharged on a course of antibiotics and was seen back in the  office on December 17, 2006, for follow-up.  He has had no further chest  pain, but he has had significant fatigue and weakness.  He is now  referred on for cardiac catheterization.   PAST MEDICAL HISTORY:  1. Recent pneumonia in February 2008.  2. Known ischemic heart disease with last catheterization in March      2002, showing normal LV function.  He has had a previous stent in      the right coronary that was patent, the left main had a stenosis of      approximately 30% in nature with a 50-60% stenosis in the mid LAD      after the second diagonal and a 30% stenosis in the proximal right      coronary.  3. Tobacco abuse.  4. Hypertension.  5. Hyperlipidemia.   ALLERGIES:  NONE.   CURRENT MEDICATIONS:  1. Lexapro 20 mg a day.  2. Nexium 40 mg a day.  3. Diovan 160 a day.  4. Aspirin daily.  5. Zocor 80 mg a day.   FAMILY HISTORY:  His mother is alive at the age of 37.  His father died  at 50 and had a history of angina or Alzheimer's.   He lives at home and has two children.  He has ongoing tobacco abuse as  well as  occasional alcohol use.   REVIEW OF SYSTEMS:  As noted above and is otherwise unremarkable.   PHYSICAL EXAMINATION:  GENERAL APPEARANCE:  He is a very pleasant white  male in no acute distress.  VITAL SIGNS:  His weight is 175 pounds, blood pressure 150/100 sitting  and 154/90 standing, heart rate 64, respirations 18, he is afebrile.  SKIN:  Warm and dry.  Color is unremarkable.  LUNGS:  Coarse with expiratory rhonchi throughout.  CARDIAC:  A regular rhythm.  Somewhat distant heart tones.  ABDOMEN:  Soft.  EXTREMITIES:  Without edema.  NEUROLOGIC:  No gross focal deficits.   PERTINENT LABS:  Pending.   OVERALL IMPRESSION:  1. Fatigue.  2. Recent hospitalization for  fever and chest pain.  3. Recent pneumonia.  4. Tobacco abuse.  5. Hyperlipidemia.  6. Hypertension.   PLAN:  Will proceed on with outpatient catheterization.  Procedure has  been reviewed in full detail and he is willing to proceed on Monday,  December 22, 2006.      Sharlee Blew, N.P.      Colleen Can. Deborah Chalk, M.D.  Electronically Signed    LC/MEDQ  D:  12/18/2006  T:  12/18/2006  Job:  [id number]

## 2011-03-01 NOTE — Cardiovascular Report (Signed)
[place]  Patient:    [name], [name]                       MRN: [id number] Proc. Date: 12/12/00 Adm. Date:  [id number] Disc. Date: [id number] Attending:  Eleanora Neighbor                        Cardiac Catheterization  REFERRING PHYSICIAN:  Hadassah Pais. Jeannetta Nap, M.D.  INDICATIONS:  [name] has known coronary atherosclerosis and had a stent placed in his right coronary artery in November of 1999.  He had a slight change in his exercise tolerance test with positive ECG findings.  He had known residual left main coronary stenosis at the time of his angioplasty to his right coronary artery and is now referred for followup catheterization.  PROCEDURES:  Left heart catheterization with selective coronary angiography, left ventricular angiography.  TYPE AND SITE OF ENTRY:  Percutaneous right femoral artery.  CATHETERS:  A 6 French 4 curved Judkins right and left coronary catheters, 6 French pigtail ventriculographic catheter.  MEDICATIONS GIVEN PRIOR TO THE PROCEDURE:  Valium 10 mg p.o.  MEDICATIONS GIVEN DURING THE PROCEDURE:  Versed 2 mg IV, Ancef 1 g IV.  COMMENTS:  The patient tolerated the procedure well.  Perclose was used.  HEMODYNAMIC DATA:  The aortic pressure was 109/56,  LV was 115/15.  There was no aortic valve gradient noted on pullback.  ANGIOGRAPHIC DATA: 1. Left main coronary artery:  Left main coronary artery had a distal 20-30%    narrowing.  This clearly has not progressed since 1999. 2. Left anterior descending:  Left anterior descending is relatively a small    vessel that crosses the apex.  There is a 50-60% narrowing after the second    diagonal vessel.  It is a short segment of stenosis but it is somewhat hazy    and of concern but probably does not warrant angioplasty at this time. 3. Left circumflex:  Left circumflex is basically normal. 4. Intermediate:  Basically normal. 5. Right coronary artery:  The right  coronary artery is a large dominant    vessel.  It is patent at the site of the stent without re-stenosis.  There    is a 30% proximal stenosis prior to this stent.  Otherwise there are only    irregularities.  LEFT VENTRICULAR ANGIOGRAM:  The left ventricular angiogram was performed in the RAO position.  Overall cardiac size and silhouette are normal.  Left ventricular function is normal, global ejection fraction 68%.  There is no mitral regurgitation, intracardiac calcification or intracavitary filling defect.  OVERALL IMPRESSION: 1. Normal left ventricular function. 2. Persistent patency of the stent in the right coronary artery. 3. Mild left main coronary artery stenosis of approximately 30% in    nature with a 50-60% stenosis in the mid left anterior descending after    the second diagonal vessel and a 30% stenosis in the proximal right    coronary artery.  DISCUSSION:  It is felt that [name] is at relatively low risk and we will continue to follow him along medically. DD:  12/12/00 TD:  12/15/00 Job: [id number]

## 2011-03-01 NOTE — Discharge Summary (Signed)
[name], [name]                ACCOUNT NO.:  [id number]   MEDICAL RECORD NO.:  [id number]          PATIENT TYPE:  INP   LOCATION:  3715                         FACILITY:  [facility]   PHYSICIAN:  Colleen Can. Deborah Chalk, M.D.DATE OF BIRTH:  19-Jun-1946   DATE OF ADMISSION:  12/08/2006  DATE OF DISCHARGE:  12/10/2006                               DISCHARGE SUMMARY   PRIMARY DISCHARGE DIAGNOSES:  1. Fever with abnormal chest x-ray and CT scan.  2. Ongoing tobacco abuse.  3. Known coronary disease.  4. Hypertension.  5. Hyperlipidemia   HISTORY OF PRESENT ILLNESS:  The patient is a very pleasant 65 year old  white male who has a known history of ischemic heart disease.  He had  catheterization with stent placement to the right coronary in November  of 1999.  He has a known residual left main stenosis of approximately  50%.  He has had a 60-70% lesion in the mid LAD.  He had his last  nuclear stress test in December of 2006.  He presented to the emergency  room department, was experiencing chest pain that began earlier in the  morning at approximately 10:00 a.m. however, he had fever up to 102 as  his initial presenting symptom.  He has had somewhat of a feeling of  fatigue, as well as a cough.  His chest discomfort was described as  pressure-like pain on the left parasternal border in the fourth  intercostal space and was associated with a fullness in his throat.  He  did not use nitroglycerin.  EMS was called and he was brought to the  emergency room, where he was pain-free.  He was subsequently seen and  evaluated and admitted for further management.   Please see the history and physical per Dr. Lemmie Evens for further  patient presentation profile.   LABORATORY DATA:  His cardiac enzymes were negative.  His CBC showed a  white count of 7.1, hemoglobin 13, hematocrit 36, platelets were160.  His chemistries were normal except for a glucose slightly elevated at  109.  Urinalysis  was unremarkable.  Magnesium was normal at 2.  BNP was  less than 30. Chest x-ray on admission showed an abnormal density in the  right mid lung.  Subsequent CT scanning was performed, which showed  scattered nodular opacities, predominately in the right upper lobe, with  chest lymphadenopathy.  These findings were most compatible with an  infectious or an inflammatory process.  He was also noted to have  emphysema.  Abdominal CT showed no acute findings.   Follow-up chest x-ray on December 10, 2006 showed emphysema and  nodularity in the lungs, especially in the right upper lobe, that are  likely infectious in etiology.   His EKG showed no acute changes.   HOSPITAL COURSE:  The patient was admitted.  He was noted to be febrile.  He was subsequently started on Rocephin.  He ruled out negative for  myocardial infarction.  Smoking cessation was discussed.  He had no  further chest pain and by December 10, 2006, was afebrile.  Our plans  are  now for him to be discharged on one-week course of antibiotics.  He  will need to have a repeat chest x-ray with repeat CT scanning in the  near future, but will also proceed on with outpatient catheterization in  the next seven to ten days as well.   DISCHARGE CONDITION:  Stable.   DISCHARGE MEDICINES:  1. Ceclor 500 t.i.d. for the next 7 days.  2. Zocor 20 mg a day.  3. Aspirin daily.  4. Diovan 160 a day,  5. Nexium 40 mg a day.  6. Lexapro 20 mg a day.   He is to call our office for a follow-up appointment in one week,  certainly sooner if any problems arise in the interim.      Sharlee Blew, N.P.      Colleen Can. Deborah Chalk, M.D.  Electronically Signed    LC/MEDQ  D:  12/10/2006  T:  12/10/2006  Job:  [id number]

## 2011-03-05 ENCOUNTER — Emergency Department: Payer: Self-pay | Admitting: Emergency Medicine

## 2011-03-21 ENCOUNTER — Encounter: Payer: Self-pay | Admitting: Cardiology

## 2011-03-22 ENCOUNTER — Encounter: Payer: Self-pay | Admitting: Cardiology

## 2011-03-22 ENCOUNTER — Ambulatory Visit (INDEPENDENT_AMBULATORY_CARE_PROVIDER_SITE_OTHER): Payer: Medicare Other | Admitting: Cardiology

## 2011-03-22 DIAGNOSIS — Z720 Tobacco use: Secondary | ICD-10-CM

## 2011-03-22 DIAGNOSIS — I2510 Atherosclerotic heart disease of native coronary artery without angina pectoris: Secondary | ICD-10-CM

## 2011-03-22 DIAGNOSIS — F17200 Nicotine dependence, unspecified, uncomplicated: Secondary | ICD-10-CM

## 2011-03-22 DIAGNOSIS — E7800 Pure hypercholesterolemia, unspecified: Secondary | ICD-10-CM

## 2011-03-24 ENCOUNTER — Encounter: Payer: Self-pay | Admitting: Cardiology

## 2011-03-24 DIAGNOSIS — E7800 Pure hypercholesterolemia, unspecified: Secondary | ICD-10-CM | POA: Insufficient documentation

## 2011-03-24 DIAGNOSIS — Z720 Tobacco use: Secondary | ICD-10-CM | POA: Insufficient documentation

## 2011-03-24 DIAGNOSIS — I2510 Atherosclerotic heart disease of native coronary artery without angina pectoris: Secondary | ICD-10-CM | POA: Insufficient documentation

## 2011-03-24 HISTORY — DX: Atherosclerotic heart disease of native coronary artery without angina pectoris: I25.10

## 2011-03-24 NOTE — Progress Notes (Signed)
Subjective:   [name] for follow visit and preoperative evaluation. He's had progressive low back pain and is planning surgery for stabilization of his low back. He apparently is going to be a complex procedure involving rods in his back and will be lengthy anesthesia.  In general, [name] has been doing well with no recurrent angina, arrhythmia, or significant shortness of breath to suggest congestive heart failure. He does have ongoing cigarette abuse and I encouraged him to stop.  His last stress Cardiolite study was in April 2010. At that time, it was done after a cardiac catheterization as a way of evaluating the significance of a mild to moderate left main coronary artery narrowing. He does have a stent in his proximal right coronary artery from 1999.    Current Outpatient Prescriptions  Medication Sig Dispense Refill  . acetaminophen (TYLENOL) 500 MG tablet Take 1,000 mg by mouth every 12 (twelve) hours as needed.        Marland Kitchen aspirin 325 MG tablet Take 325 mg by mouth daily.        . Cyanocobalamin (VITAMIN B-12 IJ) Inject 1 mL as directed every 30 (thirty) days.        Marland Kitchen escitalopram (LEXAPRO) 20 MG tablet Take 1 tablet (20 mg total) by mouth daily.  30 tablet  6  . esomeprazole (NEXIUM) 40 MG capsule Take 1 capsule (40 mg total) by mouth daily before breakfast.  30 capsule  6  . montelukast (SINGULAIR) 10 MG tablet Take 10 mg by mouth at bedtime.        Marland Kitchen morphine (MS CONTIN) 30 MG 12 hr tablet Take 30 mg by mouth 2 (two) times daily.        Marland Kitchen oxyCODONE-acetaminophen (PERCOCET) 5-325 MG per tablet Take 1 tablet by mouth every 4 (four) hours as needed.        . simvastatin (ZOCOR) 40 MG tablet Take 40 mg by mouth at bedtime.        . valsartan (DIOVAN) 320 MG tablet Take 320 mg by mouth daily. 1/2 tab daily          No Known Allergies  There is no problem list on file for this patient.   History  Smoking status  . Current Everyday Smoker -- 1.0 packs/day for 35 years  .  Types: Cigarettes  Smokeless tobacco  . Never Used    History  Alcohol Use No    Family History  Problem Relation Age of Onset  . Alzheimer's disease Father   . Angina Father   . Diabetes Cousin     mothers side  . Heart attack Cousin     fathers side  . Hyperlipidemia Cousin     fathers side  . Hypertension Cousin     fathers side    Review of Systems:   The patient denies any heat or cold intolerance.  No weight gain or weight loss.  The patient denies headaches or blurry vision.  There is no cough or sputum production.  The patient denies dizziness.  There is no hematuria or hematochezia.  The patient denies any muscle aches or arthritis.  The patient denies any rash.  The patient denies frequent falling or instability.  There is no history of depression or anxiety.  All other systems were reviewed and are negative.   Physical Exam:   Pressure is 98/50. Heart rate 60. Weight is 150 pounds.  The head is normocephalic and atraumatic.  Pupils are equally round and  reactive to light.  Sclerae nonicteric.  Conjunctiva is clear.  Oropharynx is unremarkable.  There's adequate oral airway.  Neck is supple there are no masses.  Thyroid is not enlarged.  There is no lymphadenopathy.  Lungs are coarse.  Chest is symmetric.  Heart shows a regular rate and rhythm.  S1 and S2 are normal.  There is no murmur click or gallop.  Abdomen is soft normal bowel sounds.  There is no organomegaly.  Genital and rectal deferred.  Extremities are without edema.  Peripheral pulses are adequate.  Neurologically intact.  Full range of motion.  The patient is not depressed.  Skin is warm and dry.  Assessment / Plan:

## 2011-03-24 NOTE — Assessment & Plan Note (Signed)
He agreed to stop one week before his anesthesia and in agreed to stay off tobacco altogether

## 2011-03-24 NOTE — Assessment & Plan Note (Signed)
Over all things are relatively stable. We will arrange for a stress Cardiolite study with pharmacologic infusion as a preoperative measure. No change in his current medications. He may be somewhat dehydrated today to account for his relatively low blood pressure. [place] prior to the visit.

## 2011-03-25 ENCOUNTER — Other Ambulatory Visit: Payer: Self-pay | Admitting: Cardiology

## 2011-03-25 DIAGNOSIS — I2510 Atherosclerotic heart disease of native coronary artery without angina pectoris: Secondary | ICD-10-CM

## 2011-03-25 DIAGNOSIS — Z01810 Encounter for preprocedural cardiovascular examination: Secondary | ICD-10-CM

## 2011-03-27 ENCOUNTER — Encounter: Payer: Self-pay | Admitting: Cardiology

## 2011-03-27 ENCOUNTER — Ambulatory Visit (HOSPITAL_COMMUNITY): Payer: Medicare Other | Attending: Cardiology | Admitting: Radiology

## 2011-03-27 DIAGNOSIS — Z01810 Encounter for preprocedural cardiovascular examination: Secondary | ICD-10-CM | POA: Insufficient documentation

## 2011-03-27 DIAGNOSIS — I2510 Atherosclerotic heart disease of native coronary artery without angina pectoris: Secondary | ICD-10-CM | POA: Insufficient documentation

## 2011-03-27 MED ORDER — TECHNETIUM TC 99M TETROFOSMIN IV KIT
33.0000 | PACK | Freq: Once | INTRAVENOUS | Status: AC | PRN
  Administered 2011-03-27: 33 via INTRAVENOUS

## 2011-03-27 MED ORDER — TECHNETIUM TC 99M TETROFOSMIN IV KIT
11.0000 | PACK | Freq: Once | INTRAVENOUS | Status: AC | PRN
  Administered 2011-03-27: 11 via INTRAVENOUS

## 2011-03-27 MED ORDER — REGADENOSON 0.4 MG/5ML IV SOLN
0.4000 mg | Freq: Once | INTRAVENOUS | Status: AC
  Administered 2011-03-27: 0.4 mg via INTRAVENOUS

## 2011-03-27 NOTE — Progress Notes (Signed)
[facility] SITE 3 NUCLEAR MED [address] [place] [address] [phone]  Cardiology Nuclear Med Study  Kesean Elvera Lennox Wager is a 65 y.o. male [id number] 1945-11-03   Nuclear Med Background Indication for Stress Test:  Evaluation for Ischemia, Stent Patency and Surgical Clearance for back surgery on 04/11/11 by Dr. Danielle Dess History:  COPD and '99 Stent-RCA, 01/20/09 Cath:patent stent with mild CAD, EF=60%, 01/31/09 [id number], EF=66% Cardiac Risk Factors: Hypertension, Lipids and Smoker  Symptoms:  Rapid HR   Nuclear Pre-Procedure Caffeine/Decaff Intake:  None NPO After: 6:30pm   Lungs:  Slight rhonchi, no wheezing.  O2 sat 95% on RA. IV 0.9% NS with Angio Cath:  20g  IV Site: R Antecubital  IV Started by:  Irean Hong, RN  Chest Size (in):  40 Cup Size: n/a  Height: 5\' 7"  (1.702 m)  Weight:  148 lb (67.132 kg)  BMI:  Body mass index is 23.18 kg/(m^2). Tech Comments: MS Contin taken this a.m.    Nuclear Med Study 1 or 2 day study: 1 day  Stress Test Type:  Treadmill/Lexiscan  Reading MD: Marca Ancona, MD  Order Authorizing Provider:  Roger Shelter, MD  Resting Radionuclide: Technetium 54m Tetrofosmin  Resting Radionuclide Dose: 11.0 mCi   Stress Radionuclide:  Technetium 33m Tetrofosmin  Stress Radionuclide Dose: 33.0 mCi           Stress Protocol Rest HR: 52 Stress HR: 96  Rest BP: 86/48 Stress BP: 130/46  Exercise Time (min): 2:00 METS: n/a   Predicted Max HR: 153 bpm % Max HR: 62.75 bpm Rate Pressure Product: 46962    Dose of Adenosine (mg):  n/a Dose of Lexiscan: 0.4 mg  Dose of Atropine (mg): n/a Dose of Dobutamine: n/a mcg/kg/min (at max HR)  Stress Test Technologist: Smiley Houseman, CMA-N  Nuclear Technologist:  Domenic Polite, CNMT     Rest Procedure:  Myocardial perfusion imaging was performed at rest 45 minutes following the intravenous administration of Technetium 89m Tetrofosmin.  Rest ECG: Sinus bradycardia.    Stress Procedure:   The patient's baseline BP was 86/48, without symptoms.  He walked on the treadmill piror to infusion for one minute and his BP increased to 101/49.  He was then given IV Lexiscan 0.4 mg over 15-seconds with concurrent low level exercise and then Technetium 39m Tetrofosmin was injected at 30-seconds while the patient continued walking one more minute.  There were no significant changes with Lexiscan.  His blood pressure held up well. He did c/o right leg pain with walking.  Quantitative spect images were obtained after a 45-minute delay.  Stress ECG: No significant change from baseline ECG  QPS Raw Data Images:  Normal; no motion artifact; normal heart/lung ratio. Stress Images:  Normal homogeneous uptake in all areas of the myocardium. Rest Images:  Normal homogeneous uptake in all areas of the myocardium. Subtraction (SDS):  There is no evidence of scar or ischemia. Transient Ischemic Dilatation (Normal <1.22):  1.24 Lung/Heart Ratio (Normal <0.45):  0.36  Quantitative Gated Spect Images QGS EDV:  83 ml QGS ESV:  34 ml QGS cine images:  Normal Wall Motion QGS EF: 59%  Impression Exercise Capacity:  Lexiscan with low level exercise. BP Response:  Normal blood pressure response. Clinical Symptoms:  Short of breath, nausea.  ECG Impression:  No significant ST segment change suggestive of ischemia. Comparison with Prior Nuclear Study: No significant change from previous study  Overall Impression:  Normal stress nuclear study.  Sohum Delillo [employer]

## 2011-03-28 NOTE — Progress Notes (Signed)
Copy to Dr. Deborah Chalk.Scarlette Ar

## 2011-03-29 ENCOUNTER — Telehealth: Payer: Self-pay | Admitting: *Deleted

## 2011-03-29 NOTE — Progress Notes (Signed)
No ischemia. Normal EF. Ok for Back Surgery.

## 2011-03-29 NOTE — Telephone Encounter (Addendum)
Left message with nuclear results and RN will fax ok for back surgery to [facility].

## 2011-04-03 ENCOUNTER — Other Ambulatory Visit (HOSPITAL_COMMUNITY): Payer: Self-pay | Admitting: Neurological Surgery

## 2011-04-03 ENCOUNTER — Ambulatory Visit (HOSPITAL_COMMUNITY)
Admission: RE | Admit: 2011-04-03 | Discharge: 2011-04-03 | Disposition: A | Discharge status: Home / Self Care | Payer: Medicare Other | Source: Ambulatory Visit | Attending: Neurological Surgery | Admitting: Neurological Surgery

## 2011-04-03 ENCOUNTER — Encounter (HOSPITAL_COMMUNITY)
Admission: RE | Admit: 2011-04-03 | Discharge: 2011-04-03 | Disposition: A | Discharge status: Home / Self Care | Payer: Medicare Other | Source: Ambulatory Visit | Attending: Neurological Surgery | Admitting: Neurological Surgery

## 2011-04-03 DIAGNOSIS — L923 Foreign body granuloma of the skin and subcutaneous tissue: Secondary | ICD-10-CM | POA: Insufficient documentation

## 2011-04-03 DIAGNOSIS — Z01811 Encounter for preprocedural respiratory examination: Secondary | ICD-10-CM

## 2011-04-03 DIAGNOSIS — Z01818 Encounter for other preprocedural examination: Secondary | ICD-10-CM | POA: Insufficient documentation

## 2011-04-03 DIAGNOSIS — Z01812 Encounter for preprocedural laboratory examination: Secondary | ICD-10-CM | POA: Insufficient documentation

## 2011-04-03 LAB — CBC
HCT: 35.1 % — ABNORMAL LOW (ref 39.0–52.0)
MCH: 32.3 pg (ref 26.0–34.0)
MCHC: 34.5 g/dL (ref 30.0–36.0)
Platelets: 175 10*3/uL (ref 150–400)
RDW: 14 % (ref 11.5–15.5)
WBC: 5.2 10*3/uL (ref 4.0–10.5)

## 2011-04-03 LAB — SURGICAL PCR SCREEN
MRSA, PCR: NEGATIVE
Staphylococcus aureus: NEGATIVE

## 2011-04-03 LAB — BASIC METABOLIC PANEL
BUN: 11 mg/dL (ref 6–23)
Creatinine, Ser: 0.79 mg/dL (ref 0.50–1.35)
GFR calc non Af Amer: >60 mL/min (ref >60)
Potassium: 4.1 mEq/L (ref 3.5–5.1)
Sodium: 140 mEq/L (ref 135–145)

## 2011-04-09 ENCOUNTER — Encounter: Payer: Self-pay | Admitting: Cardiology

## 2011-04-11 ENCOUNTER — Inpatient Hospital Stay (HOSPITAL_COMMUNITY): Payer: Medicare Other

## 2011-04-11 ENCOUNTER — Inpatient Hospital Stay (HOSPITAL_COMMUNITY)
Admission: RE | Admit: 2011-04-11 | Discharge: 2011-04-15 | DRG: 457 | Disposition: A | Discharge status: Home / Self Care | Payer: Medicare Other | Source: Ambulatory Visit | Attending: Neurological Surgery | Admitting: Neurological Surgery

## 2011-04-11 DIAGNOSIS — M47817 Spondylosis without myelopathy or radiculopathy, lumbosacral region: Secondary | ICD-10-CM | POA: Diagnosis present

## 2011-04-11 DIAGNOSIS — J4489 Other specified chronic obstructive pulmonary disease: Secondary | ICD-10-CM | POA: Diagnosis present

## 2011-04-11 DIAGNOSIS — I10 Essential (primary) hypertension: Secondary | ICD-10-CM | POA: Diagnosis present

## 2011-04-11 DIAGNOSIS — J449 Chronic obstructive pulmonary disease, unspecified: Secondary | ICD-10-CM | POA: Diagnosis present

## 2011-04-11 DIAGNOSIS — D62 Acute posthemorrhagic anemia: Secondary | ICD-10-CM | POA: Diagnosis not present

## 2011-04-11 DIAGNOSIS — M4120 Other idiopathic scoliosis, site unspecified: Principal | ICD-10-CM | POA: Diagnosis present

## 2011-04-11 DIAGNOSIS — I2510 Atherosclerotic heart disease of native coronary artery without angina pectoris: Secondary | ICD-10-CM | POA: Diagnosis present

## 2011-04-11 DIAGNOSIS — K219 Gastro-esophageal reflux disease without esophagitis: Secondary | ICD-10-CM | POA: Diagnosis present

## 2011-04-11 LAB — ABO/RH: ABO/RH(D): O POS

## 2011-04-12 ENCOUNTER — Inpatient Hospital Stay (HOSPITAL_COMMUNITY): Payer: Medicare Other

## 2011-04-12 LAB — BASIC METABOLIC PANEL
Chloride: 104 mEq/L (ref 96–112)
Creatinine, Ser: 0.67 mg/dL (ref 0.50–1.35)
GFR calc Af Amer: >60 mL/min (ref >60)
Potassium: 3.4 mEq/L — ABNORMAL LOW (ref 3.5–5.1)

## 2011-04-12 LAB — CBC
Platelets: 131 10*3/uL — ABNORMAL LOW (ref 150–400)
RDW: 14 % (ref 11.5–15.5)
WBC: 7 10*3/uL (ref 4.0–10.5)

## 2011-04-14 LAB — BASIC METABOLIC PANEL
BUN: 9 mg/dL (ref 6–23)
Chloride: 106 mEq/L (ref 96–112)
GFR calc Af Amer: >60 mL/min (ref >60)
Potassium: 4 mEq/L (ref 3.5–5.1)
Sodium: 138 mEq/L (ref 135–145)

## 2011-04-14 LAB — CBC
HCT: 19.2 % — ABNORMAL LOW (ref 39.0–52.0)
RDW: 14 % (ref 11.5–15.5)
WBC: 5.1 10*3/uL (ref 4.0–10.5)

## 2011-04-14 LAB — PREPARE RBC (CROSSMATCH)

## 2011-04-15 LAB — POCT I-STAT 7, (LYTES, BLD GAS, ICA,H+H)
Bicarbonate: 28.1 mEq/L — ABNORMAL HIGH (ref 20.0–24.0)
HCT: 27 % — ABNORMAL LOW (ref 39.0–52.0)
Hemoglobin: 9.2 g/dL — ABNORMAL LOW (ref 13.0–17.0)
Patient temperature: 36.6
TCO2: 30 mmol/L (ref 0–100)
pCO2 arterial: 48.1 mmHg — ABNORMAL HIGH (ref 35.0–45.0)
pH, Arterial: 7.373 (ref 7.350–7.450)
pO2, Arterial: 265 mmHg — ABNORMAL HIGH (ref 80.0–100.0)

## 2011-04-15 LAB — CBC
Hemoglobin: 9 g/dL — ABNORMAL LOW (ref 13.0–17.0)
MCH: 30.6 pg (ref 26.0–34.0)
RBC: 2.94 MIL/uL — ABNORMAL LOW (ref 4.22–5.81)
WBC: 5.4 10*3/uL (ref 4.0–10.5)

## 2011-04-15 LAB — TYPE AND SCREEN: Unit division: 0

## 2011-04-18 NOTE — Op Note (Signed)
NAMEEATHON, VALADE                ACCOUNT NO.:  [id number]  MEDICAL RECORD NO.:  [id number]  LOCATION:  3106                         FACILITY:  [facility]  PHYSICIAN:  Stefani Dama, M.D.  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  PROCEDURES:  Anterolateral decompression using XLIF technique L1-2, L2- 3, L3-4, L4-5, interbody arthrodesis with XLIF L1-2, L2-3, L3-4, and L4- 5, percutaneous segmental fixation L1-L5 with pedicle screws and rods.  SURGEON:  Stefani Dama, M.D.  FIRST ASSISTANT:  Clydene Fake, M.D.  ANESTHESIA:  General endotracheal.  INDICATIONS:  Tallin Harvill is a 65 year old individual who has had significant problems with back pain, bilateral leg pain and weakness. He has advanced spondylitic stenosis and scoliosis, worse at the levels of L2-3 and L3-4, but also present at L4-L5 and to a lesser extent at L1- L2.  Having failed efforts at conservative management and noting that the patient does have a significant medical history with some COPD and requirements for oxygen, discussed surgical intervention to include minimally invasive technique, using anterolateral decompression with PEEK spacer and placing percutaneous pedicle screws.  He was now taken to the operating room for this procedure.  PROCEDURE IN DETAIL:  The patient was brought to the operating room in supine on the stretcher after smooth induction of general endotracheal anesthesia.  He was turned into the left lateral decubitus position and had EMG monitoring placed in the L1, L2, L3, and L4-5 nerve roots including the gastrocnemii.  Once the electrodes were secured and  the patient was secured in the operating table, careful inspection with fluoroscopy was performed.  The patient had an advanced scoliosis with the apex to the left side and the most severe levels of involvement were L2-3 and L3-4, L4-5 was moderately involved and right-sided approach was chosen as the patient's iliac crest allowed approach of the L4-5 space from the side.  By taping the patient in position and breaking the table under fluoroscopic guidance to align the L4-5 site first, we were able to approach L4-5, mark the skin on the outside and then cleansed it with alcohol and DuraPrep and draped in a sterile fashion.  Then by making the first lateral incision.  A second posterior incision was made to allow finger into the retroperitoneal space and guide tube through the lateral incision down to the iliopsoas.  The psoas muscles penetrated and neural recordings were obtained.  There was initially some evidence for nearness to the L5 nerve root.  The guide tube was repositioned and on second trial the nerve roots appeared to be fairly well protected. The series of dilators were then placed over the initial guide tube after placing a K-wire into the disk space at L4-L5 then retractor was placed with 120-mm plate and this was then dilated to allow direct visualization of the lateral aspect of the annulus at the L4-5 disk space.  Once this was identified, the soft tissues could be cleared from it.  15 blade was used to create an annulotomy and then created diskectomy at L4-L5.  This was performed under gross visualization and once the initial disk material was cleared, a paddle could be inserted measuring 16 mm in AP diameter, 8 mm in height.  A second spacer measuring 8 mm x 18 mm was placed.  Ultimately, we were able to size this interspace out to allow placement of a 12-mm rectangular graft measuring 22 mm in AP diameter and 55 mm width.  This was packed with Osteocel after the  endplates were completely decorticated as best as possible could be achieved through this aperture.  Once the space was secured, attention was turned to L3-4 where through a separate lateral incision we were able to achieve the L3-4 disk space and again an initial probe was placed over the lateral aspect through the psoas muscle into the disk space with a K-wire being used to secure it.  A series of dilators were then passed over the K-wire each time checking for any neural interference.  When none was found, we were able to place the lateral retractor this time with 110 mm plates and dilated it to allow direct visualization of the annulus.  The annulus was then opened with #15 blade and a series of curettes and rongeurs were used to evacuate the disk space at the L3-4 level and ultimately we placed an interbody spacer measuring 10 mm in height, 22 mm in anterior-posterior dimension, and 55 mm in width.  At L2-3 similar procedure was carried out here, however, there was large lateral osteophytes which were taken down first to enter the disk space and after some manipulation of the disk space, we were able to obtain some distraction to 10 mm size and 10- mm x 22-mm wide cage with 16 mm in width was placed into the L2-3 disk space.  Then, again attention was turned to L1-L2 where after clearing out the disk space, a 10 x 22 x 55 mm size cage was placed into the interspace at the L1-L2 level.  Once the spacers were all placed and radiographic appearance was confirmed, then the retractors were removed from the wounds.  The wounds were irrigated with antibiotic irrigating solution and then 2-0 Vicryl was used to close the fascia, 3-0 Vicryl was used to close the subcuticular tissues.  The patient was at this point repositioned from the left lateral decubitus position to the prone position, care being taken to appropriately pad the bony prominences. The electromyographic monitoring was  discontinued and then the back was prepped with alcohol and DuraPrep and draped in sterile fashion.  Then with fluoroscopic guidance we placed initially a Jamshidi needle into lateral aspect of the pedicles bilaterally at L1, L2, L3, L4 and L5. This was first done by placing a Jamshidi needle into the pedicle and placing a K-wire through the Jamshidi needle and then using this percutaneous technique to place pedicle screws.  This included 10 screws measuring 6.5 x 50 mm in size from L1 through L5.  Once the screws were placed and percutaneous placement of a rod to connect the screws was performed first by aligning all the screws, had extensions and checking their height to make sure that rod could be contoured to the appropriate size.  Once this was done, first a rod was placed on the left side, then rod was placed on the right side and was ultimately torqued  into final locking position.  This procedure took some time as the scoliosis was difficult to reduce and ultimately placing and tightening the rods in their positions took some effort.  Once this was completed, blood loss was estimated about 250 mL.  The wounds were irrigated copiously.  The fascia was closed with 2-0 Vicryl interrupted fashion, 3-0 Vicryl used subcuticularly.  Dermabond was used on the skin.  The patient tolerated procedure well and was returned to recovery room in stable condition.     Stefani Dama, M.D.     Merla Riches  D:  04/11/2011  T:  04/12/2011  Job:  [id number]  Electronically Signed by Barnett Abu M.D. on 04/18/2011 05:24:23 PM

## 2011-04-20 ENCOUNTER — Emergency Department: Payer: Medicare Other | Admitting: Emergency Medicine

## 2011-05-20 NOTE — Discharge Summary (Signed)
[name], [name]                ACCOUNT NO.:  [id number]  MEDICAL RECORD NO.:  [id number]  LOCATION:  3037                         FACILITY:  [facility]  PHYSICIAN:  Stefani Dama, M.D.  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  SECONDARY DIAGNOSES: 1. Chronic obstructive pulmonary disease. 2. Hypertension. 3. Hypercholesterolemia. 4. Gastrointestinal reflux disease.  OPERATIONS AND PROCEDURES:  Anterolateral decompression using XLIF technique at L1-2, L2-3, L3-4, and L4-5 with interbody arthrodesis and percutaneous segmental fixation L1-5 with pedicle screws and rods.  BRIEF HISTORY AND HOSPITAL COURSE:  The patient is a 65 year old gentleman who has had significant problems with low back pain, bilateral lower extremity pain, and weakness.  He has advanced spondylitic stenosis multifactorial and degenerative scoliosis, the worst levels are at L1-2, L2-3, L3-4 and L4-5.  He has failed conservative management. He elects to proceed with decompression and fusion at L1-5.  He tolerated his procedure well on April 12, 2011.  First day postoperatively, he had some substantial pain, he was in the neurosurgical ICU.  He was given Dilaudid IV.  He was placed on Toradol, we will restart his p.o. home meds.  He does normally take morphine sulfate 30 mg b.i.d.  Foley catheter was discontinued.  He was placed on bowel regimen.  We weaned him off of the Dilaudid to p.o. pain medications.  Second day postoperatively, continued to mobilize, hemoglobin was 6.4 with  hematocrit of 19.  He was transfused 2 units of packed RBCs secondary to low hemoglobin and hematocrit to maximize oxygenation, decrease stresses on hard, maximize his ability to mobilize.  Posttransfusion, his hemoglobin was 9.0.  He is feeling much better.  His strength had improved.  Ambulatory status had improved.  He is ready for discharge home.  Eating well, voiding well, afebrile, vital signs were stable.  Wound benign, no erythema, drainage, signs of infection, ambulating safely with an Aspen and QuikDraw brace when he is up and out of bed.  DISCHARGE CONDITION:  Stable and improved.  DISCHARGE INSTRUCTIONS:  Discharge home.  Continue on his home medications of Asmanex inhaler two puffs b.i.d., morphine sulfate 30 mg p.o. b.i.d., Diovan 3-20 mg one half tablet p.o. daily, Lexapro one tablet 20 mg p.o. daily, Nexium one tablet 40 mg p.o. daily, aspirin 325 mg p.o. daily, Zocor 40 mg p.o. daily, Singulair 10 mg p.o. daily, extra strength Tylenol 5 mg p.r.n. pain and Percocet 5/325 one to two p.o. q.4 h. p.r.n. pain, gabapentin 300 mg p.o. nightly, no more than 3000 mg of Tylenol total daily.  Regular diet.  He may shower.  Continued increase his mobilization.  Contact our office for followup appointment in 3-4 weeks for clinical check and radiographs.  Contact our office prior to follow up if there is any question or concerns.  Home equipment is  ordered as needed.     Aura Fey [facility].   ______________________________ Stefani Dama, M.D.    SCI/MEDQ  D:  04/15/2011  T:  04/15/2011  Job:  [id number]  Electronically Signed by Orlin Hilding P.A. on 04/19/2011 04:39:04 PM Electronically Signed by Barnett Abu M.D. on 05/20/2011 07:33:50 AM

## 2011-07-22 ENCOUNTER — Other Ambulatory Visit: Payer: Self-pay | Admitting: Cardiovascular Disease

## 2011-07-24 MED ORDER — SIMVASTATIN 40 MG PO TABS: 40.0000 mg | ORAL_TABLET | Freq: Every day | ORAL | Status: DC

## 2011-09-22 ENCOUNTER — Other Ambulatory Visit: Payer: Self-pay | Admitting: Cardiology

## 2011-09-24 ENCOUNTER — Encounter: Payer: Self-pay | Admitting: *Deleted

## 2011-09-24 ENCOUNTER — Encounter: Payer: Self-pay | Admitting: Cardiovascular Disease

## 2011-09-24 ENCOUNTER — Ambulatory Visit (INDEPENDENT_AMBULATORY_CARE_PROVIDER_SITE_OTHER): Payer: Medicare Other | Admitting: Cardiovascular Disease

## 2011-09-24 VITALS — BP 136/75 | HR 58 | Ht 66.0 in | Wt 149.0 lb

## 2011-09-24 DIAGNOSIS — F17200 Nicotine dependence, unspecified, uncomplicated: Secondary | ICD-10-CM

## 2011-09-24 DIAGNOSIS — I2510 Atherosclerotic heart disease of native coronary artery without angina pectoris: Secondary | ICD-10-CM

## 2011-09-24 DIAGNOSIS — Z720 Tobacco use: Secondary | ICD-10-CM

## 2011-09-24 DIAGNOSIS — E7800 Pure hypercholesterolemia, unspecified: Secondary | ICD-10-CM

## 2011-09-24 DIAGNOSIS — E785 Hyperlipidemia, unspecified: Secondary | ICD-10-CM

## 2011-09-24 NOTE — Assessment & Plan Note (Signed)
Continue statin. Check lipids and LFTS.

## 2011-09-24 NOTE — Progress Notes (Signed)
History of Present Illness: 65 yo WM with history of CAD, tobacco abuse, HTN, COPD here today to establish cardiology care. He is a former pt of Dr. Deborah Chalk. His heart history dates back to 1999 when he had a stent placed in the RCA. His last cath was in April 2010. The left main artery had 30% stenosis. There was mild disease in the LAD, no disease in the Circumflex. His RCA had a patent proximal stent. Stress myoview in April 2010 was without ischemia. He had back surgery April 11, 2011 and has done well.   He has had no chest pain, SOB, palpitations. He continues to smoke 1ppd. 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    Past Medical History  Diagnosis Date  . CAD (coronary artery disease)     Stent RCA 1999. Last cath April 2010 with patent RCA stent, 30% LM, mild plaque LAD and RCA  . Back pain     scoliosis  . Hypertension   . COPD (chronic obstructive pulmonary disease)   . Anemia     Past Surgical History  Procedure Date  . Appendectomy 1957  . Foot surgery 1973    left  . Cardiac catheterization 1999    stent placement RCA  . Cardiac catheterization 2010    mild left CA distal stenosis, mild irregularities in RCA and LAD. patent setn in RCA  . Back surgery     Current Outpatient Prescriptions  Medication Sig Dispense Refill  . acetaminophen (TYLENOL) 500 MG tablet Take 1,000 mg by mouth every 12 (twelve) hours as needed.        Marland Kitchen aspirin 325 MG tablet Take 325 mg by mouth daily.        . Cyanocobalamin (VITAMIN B-12 IJ) Inject 1 mL as directed every 30 (thirty) days.        Marland Kitchen escitalopram (LEXAPRO) 20 MG tablet TAKE 1 TABLET BY MOUTH EVERY DAY  30 tablet  6  . morphine (MS CONTIN) 30 MG 12 hr tablet Take 30 mg by mouth 2 (two) times daily.        Marland Kitchen NEXIUM 40 MG capsule TAKE ONE CAPSULE BY MOUTH EVERY DAY BEFORE BREAKFAST  30 capsule  6  . oxyCODONE-acetaminophen (PERCOCET) 5-325 MG per tablet Take 1 tablet by mouth every 4 (four) hours as needed.          . simvastatin (ZOCOR) 40 MG tablet Take 1 tablet (40 mg total) by mouth at bedtime.  30 tablet  6    Allergies  Allergen Reactions  . Levaquin Itching    History   Social History  . Marital Status: Divorced    Spouse Name: N/A    Number of Children: 3  . Years of Education: N/A   Occupational History  .     Social History Main Topics  . Smoking status: Current Everyday Smoker -- 1.0 packs/day for 35 years    Types: Cigarettes  . Smokeless tobacco: Never Used  . Alcohol Use: 3.5 oz/week    7 drink(s) per week  . Drug Use: No  . Sexually Active: Not on file   Other Topics Concern  . Not on file   Social History Narrative  . No narrative on file    Family History  Problem Relation Age of Onset  . Alzheimer's disease Father   . Angina Father   . Diabetes Cousin     mothers side  . Heart attack Cousin  fathers side  . Hyperlipidemia Cousin     fathers side  . Hypertension Cousin     fathers side    Review of Systems:  As stated in the HPI and otherwise negative.   BP 136/75  Pulse 58  Ht 5\' 6"  (1.676 m)  Wt 149 lb (67.586 kg)  BMI 24.05 kg/m2  Physical Examination: General: Well developed, well nourished, NAD HEENT: OP clear, mucus membranes moist SKIN: warm, dry. No rashes. Neuro: No focal deficits Musculoskeletal: Muscle strength 5/5 all ext Psychiatric: Mood and affect normal Neck: No JVD, no carotid bruits, no thyromegaly, no lymphadenopathy. Lungs:Clear bilaterally, no wheezes, rhonci, crackles Cardiovascular: Regular rate and rhythm. No murmurs, gallops or rubs. Abdomen:Soft. Bowel sounds present. Non-tender.  Extremities: No lower extremity edema. Pulses are 2 + in the bilateral DP/PT.  Cardiac Cath April 2010:    1. Right coronary artery.  The right coronary artery is a large       dominant vessel.  There is a stent that is patent proximally.       There are irregularities of 30% or less scattered throughout the       right coronary  artery, but there is no narrowing within the body of       the stent.   2. Left circumflex is normal.   3. There is a small intermediate.  It is normal.   4. Left anterior descending.  The left anterior descending had mild       irregularities and somewhat diffuse 20-30% narrowings.   5. Left main coronary artery.  The left main coronary artery had a       definite 30-40% distal narrowing.  Numerous views of this were       obtained, and it is clear that it has not progressed beyond that       point and does not appear to have significant obstructive quality       to it.

## 2011-09-24 NOTE — Patient Instructions (Signed)
Your physician wants you to follow-up in: 6 months.  You will receive a reminder letter in the mail two months in advance. If you don't receive a letter, please call our office to schedule the follow-up appointment.  Your physician recommends that you return for fasting lab work next week--Lipid and Liver profile.

## 2011-09-24 NOTE — Assessment & Plan Note (Addendum)
Stable. No angina. He has been on ASA and a statin. Will check fasting lipids and LFTs.

## 2011-09-24 NOTE — Assessment & Plan Note (Signed)
Smoking cessation encouraged!

## 2011-10-01 ENCOUNTER — Other Ambulatory Visit: Payer: Medicare Other | Admitting: *Deleted

## 2011-11-19 ENCOUNTER — Other Ambulatory Visit: Payer: Medicare Other | Admitting: *Deleted

## 2011-11-28 ENCOUNTER — Ambulatory Visit: Payer: Self-pay | Admitting: Internal Medicine

## 2011-11-28 LAB — BASIC METABOLIC PANEL
Anion Gap: 2 — ABNORMAL LOW (ref 7–16)
Calcium, Total: 9 mg/dL (ref 8.5–10.1)
Chloride: 104 mmol/L (ref 98–107)
Co2: 33 mmol/L — ABNORMAL HIGH (ref 21–32)
Creatinine: 0.87 mg/dL (ref 0.60–1.30)
EGFR (African American): >60
Osmolality: 277 (ref 275–301)

## 2011-12-12 ENCOUNTER — Ambulatory Visit: Payer: Self-pay | Admitting: Internal Medicine

## 2012-03-12 ENCOUNTER — Other Ambulatory Visit: Payer: Self-pay | Admitting: Cardiovascular Disease

## 2012-03-23 ENCOUNTER — Encounter: Payer: Self-pay | Admitting: Cardiovascular Disease

## 2012-03-23 ENCOUNTER — Ambulatory Visit (INDEPENDENT_AMBULATORY_CARE_PROVIDER_SITE_OTHER): Payer: Medicare Other | Admitting: Cardiovascular Disease

## 2012-03-23 VITALS — BP 116/68 | HR 80 | Ht 66.0 in | Wt 148.0 lb

## 2012-03-23 DIAGNOSIS — I2510 Atherosclerotic heart disease of native coronary artery without angina pectoris: Secondary | ICD-10-CM

## 2012-03-23 LAB — HEPATIC FUNCTION PANEL
AST: 18 U/L (ref 0–37)
Albumin: 4.1 g/dL (ref 3.5–5.2)
Alkaline Phosphatase: 114 U/L (ref 39–117)
Bilirubin, Direct: 0.1 mg/dL (ref 0.0–0.3)
Total Protein: 7.2 g/dL (ref 6.0–8.3)

## 2012-03-23 LAB — LIPID PANEL
Cholesterol: 128 mg/dL (ref 0–200)
Triglycerides: 89 mg/dL (ref 0.0–149.0)

## 2012-03-23 NOTE — Assessment & Plan Note (Addendum)
Stable. NO chest pains or SOB. Will continue current therapy. Check lipids today with LFTs. BP is well controlled. Smoking cessation encouraged. He will call back if insurance covers Chantix and we will start then.

## 2012-03-23 NOTE — Progress Notes (Signed)
History of Present Illness: 66 yo WM with history of CAD, tobacco abuse, HTN, COPD here today for cardiac follow up. I saw him for the first time in December 2012. He is a former pt of Dr. Deborah Chalk. His heart history dates back to 1999 when he had a stent placed in the RCA. His last cath was in April 2010. The left main artery had 30% stenosis. There was mild disease in the LAD, no disease in the Circumflex. His RCA had a patent proximal stent. Stress myoview in April 2010 after the cath to assess left main stenosis was without ischemia. He had back surgery April 11, 2011 and has done well.   He has had no chest pain, SOB, palpitations. He continues to smoke 1ppd. 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 He is overall doing well.    Primary Care Physician: Barbette Reichmann   Past Medical History  Diagnosis Date  . CAD (coronary artery disease)     Stent RCA 1999. Last cath April 2010 with patent RCA stent, 30% LM, mild plaque LAD and RCA  . Back pain     scoliosis  . Hypertension   . COPD (chronic obstructive pulmonary disease)   . Anemia     Past Surgical History  Procedure Date  . Appendectomy 1957  . Foot surgery 1973    left  . Cardiac catheterization 1999    stent placement RCA  . Cardiac catheterization 2010    mild left CA distal stenosis, mild irregularities in RCA and LAD. patent setn in RCA  . Back surgery     Current Outpatient Prescriptions  Medication Sig Dispense Refill  . acetaminophen (TYLENOL) 500 MG tablet Take 1,000 mg by mouth every 12 (twelve) hours as needed.        Marland Kitchen aspirin 325 MG tablet Take 325 mg by mouth daily.        . Cyanocobalamin (VITAMIN B-12 IJ) Inject 1 mL as directed every 30 (thirty) days.        Marland Kitchen escitalopram (LEXAPRO) 20 MG tablet TAKE 1 TABLET BY MOUTH EVERY DAY  30 tablet  6  . morphine (MS CONTIN) 30 MG 12 hr tablet Take 15 mg by mouth daily.       Marland Kitchen  NEXIUM 40 MG capsule TAKE ONE CAPSULE BY MOUTH EVERY DAY BEFORE BREAKFAST  30 capsule  6  . oxyCODONE-acetaminophen (PERCOCET) 5-325 MG per tablet Take 1 tablet by mouth every 4 (four) hours as needed.        . simvastatin (ZOCOR) 40 MG tablet TAKE 1 TABLET BY MOUTH EVERY DAY AT BEDTIME  30 tablet  6    Allergies  Allergen Reactions  . Levofloxacin Itching    History   Social History  . Marital Status: Divorced    Spouse Name: N/A    Number of Children: 3  . Years of Education: N/A   Occupational History  .     Social History Main Topics  . Smoking status: Current Everyday Smoker -- 1.0 packs/day for 35 years    Types: Cigarettes  . Smokeless tobacco: Never Used  . Alcohol Use: 3.5 oz/week    7 drink(s) per week  . Drug Use: No  . Sexually Active: Not on file   Other Topics Concern  . Not on file   Social History Narrative  . No narrative on file  Family History  Problem Relation Age of Onset  . Alzheimer's disease Father   . Angina Father   . Diabetes Cousin     mothers side  . Heart attack Cousin     fathers side  . Hyperlipidemia Cousin     fathers side  . Hypertension Cousin     fathers side    Review of Systems:  As stated in the HPI and otherwise negative.   BP 116/68  Pulse 80  Ht 5\' 6"  (1.676 m)  Wt 148 lb (67.132 kg)  BMI 23.89 kg/m2  Physical Examination: General: Well developed, well nourished, NAD HEENT: OP clear, mucus membranes moist SKIN: warm, dry. No rashes. Neuro: No focal deficits Musculoskeletal: Muscle strength 5/5 all ext Psychiatric: Mood and affect normal Neck: No JVD, no carotid bruits, no thyromegaly, no lymphadenopathy. Lungs:Clear bilaterally, no wheezes, rhonci, crackles Cardiovascular: Regular rate and rhythm. No murmurs, gallops or rubs. Abdomen:Soft. Bowel sounds present. Non-tender.  Extremities: No lower extremity edema. Pulses are 2 + in the bilateral DP/PT.

## 2012-03-23 NOTE — Patient Instructions (Signed)
Your physician wants you to follow-up in:  6 months. You will receive a reminder letter in the mail two months in advance. If you don't receive a letter, please call our office to schedule the follow-up appointment.   

## 2012-04-06 ENCOUNTER — Encounter: Payer: Self-pay | Admitting: Cardiology

## 2012-05-29 ENCOUNTER — Other Ambulatory Visit: Payer: Self-pay | Admitting: Nurse Practitioner

## 2012-10-19 ENCOUNTER — Other Ambulatory Visit (HOSPITAL_COMMUNITY): Payer: Self-pay | Admitting: Neurological Surgery

## 2012-10-19 ENCOUNTER — Other Ambulatory Visit: Payer: Self-pay | Admitting: Neurological Surgery

## 2012-10-19 DIAGNOSIS — M419 Scoliosis, unspecified: Secondary | ICD-10-CM

## 2012-11-03 ENCOUNTER — Encounter (HOSPITAL_COMMUNITY): Payer: Self-pay | Admitting: Pharmacy Technician

## 2012-11-04 ENCOUNTER — Ambulatory Visit (HOSPITAL_COMMUNITY)
Admission: RE | Admit: 2012-11-04 | Discharge: 2012-11-04 | Disposition: A | Discharge status: Home / Self Care | Payer: Medicare Other | Source: Ambulatory Visit | Attending: Neurological Surgery | Admitting: Neurological Surgery

## 2012-11-04 ENCOUNTER — Other Ambulatory Visit: Payer: Self-pay | Admitting: Cardiovascular Disease

## 2012-11-04 DIAGNOSIS — M4804 Spinal stenosis, thoracic region: Secondary | ICD-10-CM | POA: Insufficient documentation

## 2012-11-04 DIAGNOSIS — M4120 Other idiopathic scoliosis, site unspecified: Secondary | ICD-10-CM | POA: Insufficient documentation

## 2012-11-04 DIAGNOSIS — M419 Scoliosis, unspecified: Secondary | ICD-10-CM

## 2012-11-04 DIAGNOSIS — M79609 Pain in unspecified limb: Secondary | ICD-10-CM | POA: Insufficient documentation

## 2012-11-04 DIAGNOSIS — Z981 Arthrodesis status: Secondary | ICD-10-CM | POA: Insufficient documentation

## 2012-11-04 DIAGNOSIS — R5381 Other malaise: Secondary | ICD-10-CM | POA: Insufficient documentation

## 2012-11-04 MED ORDER — ONDANSETRON HCL 4 MG/2ML IJ SOLN: 4.0000 mg | Freq: Four times a day (QID) | INTRAMUSCULAR | Status: DC | PRN

## 2012-11-04 MED ORDER — DIAZEPAM 5 MG PO TABS
ORAL_TABLET | ORAL | Status: AC
  Administered 2012-11-04: 10 mg
  Filled 2012-11-04: qty 2

## 2012-11-04 MED ORDER — IOHEXOL 180 MG/ML  SOLN
20.0000 mL | Freq: Once | INTRAMUSCULAR | Status: AC | PRN
  Administered 2012-11-04: 14 mL via INTRATHECAL

## 2012-11-04 MED ORDER — DIAZEPAM 5 MG PO TABS: 10.0000 mg | ORAL_TABLET | Freq: Once | ORAL | Status: DC

## 2012-11-04 NOTE — Procedures (Signed)
Augustus returns to the office today.  He has had a decompression and fusion from L1 to S1 that I performed back in July of 2012.  This has healed nicely and Ronnie tells me that his back is feeling quite well.  The biggest problem is that he has right anterior thigh pain that persists.  He notes it is uncomfortable to greater or lesser degrees but there on a daily basis.    The last radiographs in April of last year demonstrated that he was forming a solid arthrodesis immediately postoperatively.  I did a CT scan which demonstrated that the hardware that we placed was in good position and did not seem to affect the exiting nerve roots.    I advised Crews that at this point with his persistent radicular problem and some weakness in the right lower extremity in the region of the quad and iliopsoas that he may have some ongoing nerve root irritability.  I suggested the best way to work this up would be with a myelogram and post myelogram CT scan to see if there is any evidence of compression of the exiting L2, L3 or L4 nerve roots in the spine.  We will plan this study in the near future.  In the meantime, he continues to tolerate his activities as best possible.    Pre op Dx: Lumbar scoliosis, lumbar radiculopathy Post op Dx: Lumbar scoliosis, lumbar radiculopathy status post arthrodesis L1-S1 Procedure: Lumbar myelogram Surgeon: Katherine Tout Puncture level: L3-4 Fluid color: Iohexol 180 14 cc Injection: Clear colorless Findings: Rod fusion from L1-S1 with interbody grafts present, residual spondylosis

## 2012-11-05 ENCOUNTER — Encounter: Payer: Self-pay | Admitting: Cardiovascular Disease

## 2012-11-05 ENCOUNTER — Other Ambulatory Visit (INDEPENDENT_AMBULATORY_CARE_PROVIDER_SITE_OTHER): Payer: Medicare Other

## 2012-11-05 ENCOUNTER — Ambulatory Visit (INDEPENDENT_AMBULATORY_CARE_PROVIDER_SITE_OTHER): Payer: Medicare Other | Admitting: Cardiovascular Disease

## 2012-11-05 ENCOUNTER — Other Ambulatory Visit: Payer: Self-pay | Admitting: *Deleted

## 2012-11-05 VITALS — BP 143/72 | HR 65 | Ht 67.0 in | Wt 154.1 lb

## 2012-11-05 DIAGNOSIS — Z720 Tobacco use: Secondary | ICD-10-CM

## 2012-11-05 DIAGNOSIS — I2510 Atherosclerotic heart disease of native coronary artery without angina pectoris: Secondary | ICD-10-CM

## 2012-11-05 DIAGNOSIS — F17200 Nicotine dependence, unspecified, uncomplicated: Secondary | ICD-10-CM

## 2012-11-05 DIAGNOSIS — E7800 Pure hypercholesterolemia, unspecified: Secondary | ICD-10-CM

## 2012-11-05 LAB — HEPATIC FUNCTION PANEL
ALT: 12 U/L (ref 0–53)
Alkaline Phosphatase: 111 U/L (ref 39–117)
Bilirubin, Direct: 0 mg/dL (ref 0.0–0.3)
Total Bilirubin: 0.5 mg/dL (ref 0.3–1.2)

## 2012-11-05 LAB — LIPID PANEL
HDL: 36 mg/dL — ABNORMAL LOW (ref >39.00)
LDL Cholesterol: 69 mg/dL (ref 0–99)
Total CHOL/HDL Ratio: 4
Triglycerides: 142 mg/dL (ref 0.0–149.0)

## 2012-11-05 LAB — BASIC METABOLIC PANEL
Calcium: 8.9 mg/dL (ref 8.4–10.5)
Chloride: 104 mEq/L (ref 96–112)
Creatinine, Ser: 0.8 mg/dL (ref 0.4–1.5)
Sodium: 139 mEq/L (ref 135–145)

## 2012-11-05 NOTE — Patient Instructions (Addendum)
Your physician wants you to follow-up in:  6 months.  You will receive a reminder letter in the mail two months in advance. If you don't receive a letter, please call our office to schedule the follow-up appointment.  Your physician has requested that you have an exercise stress myoview. For further information please visit [email or web address]. Please follow instruction sheet, as given.  

## 2012-11-05 NOTE — Progress Notes (Signed)
History of Present Illness: 67 yo WM with history of CAD, tobacco abuse, HTN, COPD here today for cardiac follow up. I saw him for the first time in December 2012. He is a former pt of Dr. Deborah Chalk. His heart history dates back to 1999 when he had a stent placed in the RCA. His last cath was in April 2010. The left main artery had 30% stenosis. There was mild disease in the LAD, no disease in the Circumflex. His RCA had a patent proximal stent. Stress myoview in April 2010 after the cath to assess left main stenosis was without ischemia.  He has had no chest pain, SOB, palpitations. He continues to smoke 1ppd. 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 He is overall doing well. He has had back pain and is having this evaluated. He may need surgery soon.   Primary Care Physician: Barbette Reichmann  Last Lipid Profile:Lipid Panel     Component Value Date/Time   CHOL 128 03/23/2012 1233   TRIG 89.0 03/23/2012 1233   HDL 45.60 03/23/2012 1233   CHOLHDL 3 03/23/2012 1233   VLDL 17.8 03/23/2012 1233   LDLCALC 65 03/23/2012 1233     Past Medical History  Diagnosis Date  . CAD (coronary artery disease)     Stent RCA 1999. Last cath April 2010 with patent RCA stent, 30% LM, mild plaque LAD and RCA  . Back pain     scoliosis  . Hypertension   . COPD (chronic obstructive pulmonary disease)   . Anemia     Past Surgical History  Procedure Date  . Appendectomy 1957  . Foot surgery 1973    left  . Cardiac catheterization 1999    stent placement RCA  . Cardiac catheterization 2010    mild left CA distal stenosis, mild irregularities in RCA and LAD. patent setn in RCA  . Back surgery     Current Outpatient Prescriptions  Medication Sig Dispense Refill  . acetaminophen (TYLENOL) 500 MG tablet Take 1,000 mg by mouth every 12 (twelve) hours as needed.        Marland Kitchen aspirin 325 MG tablet Take 325 mg by mouth daily.         Marland Kitchen escitalopram (LEXAPRO) 20 MG tablet TAKE 1 TABLET BY MOUTH EVERY DAY  30 tablet  6  . ibuprofen (ADVIL,MOTRIN) 800 MG tablet Take 800 mg by mouth every 8 (eight) hours as needed. For pain      . montelukast (SINGULAIR) 10 MG tablet Take 10 mg by mouth at bedtime.      Marland Kitchen morphine (MS CONTIN) 15 MG 12 hr tablet Take 15 mg by mouth 2 (two) times daily.       Marland Kitchen NEXIUM 40 MG capsule TAKE ONE CAPSULE BY MOUTH EVERY DAY BEFORE BREAKFAST  30 capsule  6  . oxyCODONE-acetaminophen (PERCOCET) 5-325 MG per tablet Take 1 tablet by mouth every 4 (four) hours as needed. For pain      . simvastatin (ZOCOR) 40 MG tablet TAKE 1 TABLET BY MOUTH EVERY DAY AT BEDTIME  30 tablet  6  . tiotropium (SPIRIVA) 18 MCG inhalation capsule Place 18 mcg into inhaler and inhale daily.        Allergies  Allergen Reactions  . Levofloxacin Itching    History   Social History  . Marital Status: Divorced    Spouse Name: N/A  Number of Children: 3  . Years of Education: N/A   Occupational History  .     Social History Main Topics  . Smoking status: Current Every Day Smoker -- 1.0 packs/day for 35 years    Types: Cigarettes  . Smokeless tobacco: Never Used  . Alcohol Use: 3.5 oz/week    7 drink(s) per week  . Drug Use: No  . Sexually Active: Not on file   Other Topics Concern  . Not on file   Social History Narrative  . No narrative on file    Family History  Problem Relation Age of Onset  . Alzheimer's disease Father   . Angina Father   . Diabetes Cousin     mothers side  . Heart attack Cousin     fathers side  . Hyperlipidemia Cousin     fathers side  . Hypertension Cousin     fathers side    Review of Systems:  As stated in the HPI and otherwise negative.   BP 143/72  Pulse 65  Ht 5\' 7"  (1.702 m)  Wt 154 lb 1.9 oz (69.908 kg)  BMI 24.14 kg/m2  Physical Examination: General: Well developed, well nourished, NAD HEENT: OP clear, mucus membranes moist SKIN: warm, dry. No  rashes. Neuro: No focal deficits Musculoskeletal: Muscle strength 5/5 all ext Psychiatric: Mood and affect normal Neck: No JVD, no carotid bruits, no thyromegaly, no lymphadenopathy. Lungs:Clear bilaterally, no wheezes, rhonci, crackles Cardiovascular: Regular rate and rhythm. No murmurs, gallops or rubs. Abdomen:Soft. Bowel sounds present. Non-tender.  Extremities: No lower extremity edema. Pulses are 2 + in the bilateral DP/PT.  EKG: Sinus, rate 56 bpm. PAC.   Assessment and Plan:   1. CAD: Stable. No angina. He is known to have a mild to moderate left main stenosis. Will arrange exercise stress myoview to exclude ischemia given his left main stenosis. Lipids controlled June 2013. Continue statin. Continue ASA.   2. Tobacco abuse: Smoking cessation recommended. He will try an e-cigarette

## 2012-11-10 ENCOUNTER — Encounter: Payer: Self-pay | Admitting: Cardiology

## 2012-11-12 ENCOUNTER — Encounter (HOSPITAL_COMMUNITY): Payer: Medicare Other

## 2012-11-26 ENCOUNTER — Encounter (HOSPITAL_COMMUNITY): Payer: Medicare Other

## 2012-12-02 ENCOUNTER — Ambulatory Visit (HOSPITAL_COMMUNITY): Payer: Medicare Other | Attending: Cardiology | Admitting: Radiology

## 2012-12-02 VITALS — BP 107/59 | Ht 68.0 in | Wt 152.0 lb

## 2012-12-02 DIAGNOSIS — J4489 Other specified chronic obstructive pulmonary disease: Secondary | ICD-10-CM | POA: Insufficient documentation

## 2012-12-02 DIAGNOSIS — R0602 Shortness of breath: Secondary | ICD-10-CM | POA: Insufficient documentation

## 2012-12-02 DIAGNOSIS — I2510 Atherosclerotic heart disease of native coronary artery without angina pectoris: Secondary | ICD-10-CM | POA: Insufficient documentation

## 2012-12-02 DIAGNOSIS — F17200 Nicotine dependence, unspecified, uncomplicated: Secondary | ICD-10-CM | POA: Insufficient documentation

## 2012-12-02 DIAGNOSIS — I498 Other specified cardiac arrhythmias: Secondary | ICD-10-CM | POA: Insufficient documentation

## 2012-12-02 DIAGNOSIS — R002 Palpitations: Secondary | ICD-10-CM | POA: Insufficient documentation

## 2012-12-02 DIAGNOSIS — E785 Hyperlipidemia, unspecified: Secondary | ICD-10-CM | POA: Insufficient documentation

## 2012-12-02 DIAGNOSIS — R0989 Other specified symptoms and signs involving the circulatory and respiratory systems: Secondary | ICD-10-CM | POA: Insufficient documentation

## 2012-12-02 DIAGNOSIS — I10 Essential (primary) hypertension: Secondary | ICD-10-CM | POA: Insufficient documentation

## 2012-12-02 DIAGNOSIS — J449 Chronic obstructive pulmonary disease, unspecified: Secondary | ICD-10-CM | POA: Insufficient documentation

## 2012-12-02 DIAGNOSIS — R0609 Other forms of dyspnea: Secondary | ICD-10-CM | POA: Insufficient documentation

## 2012-12-02 MED ORDER — TECHNETIUM TC 99M SESTAMIBI GENERIC - CARDIOLITE
11.0000 | Freq: Once | INTRAVENOUS | Status: AC | PRN
  Administered 2012-12-02: 11 via INTRAVENOUS

## 2012-12-02 MED ORDER — REGADENOSON 0.4 MG/5ML IV SOLN
0.4000 mg | Freq: Once | INTRAVENOUS | Status: AC
  Administered 2012-12-02: 0.4 mg via INTRAVENOUS

## 2012-12-02 MED ORDER — TECHNETIUM TC 99M SESTAMIBI GENERIC - CARDIOLITE
33.0000 | Freq: Once | INTRAVENOUS | Status: AC | PRN
  Administered 2012-12-02: 33 via INTRAVENOUS

## 2012-12-02 NOTE — Progress Notes (Signed)
[facility] SITE 3 NUCLEAR MED [address] [place], [place] [address] [phone]    Cardiology Nuclear Med Study  [name] is a 67 y.o. male     MRN : [id number]     DOB: [birth year]  Procedure Date: 12/02/2012  Nuclear Med Background Indication for Stress Test:  Evaluation for Ischemia and Stent Patency History:  COPD and CAD Cardiac Risk Factors: Family History - CAD, Hypertension, Lipids and Smoker  Symptoms:  DOE, Palpitations and SOB   Nuclear Pre-Procedure Caffeine/Decaff Intake:  None NPO After: 8:00pm   Lungs:  clear O2 Sat: 95% on room air. IV 0.9% NS with Angio Cath:  22g  IV Site: R Antecubital  IV Started by:  Bonnita Levan, RN  Chest Size (in):  44 Cup Size: n/a  Height: 5\' 8"  (1.727 m)  Weight:  152 lb (68.947 kg)  BMI:  Body mass index is 23.12 kg/(m^2). Tech Comments:  This patient walked for quite a while before his leg gave him burning and numbness and he had to be switched to a walking Lexiscan.     Nuclear Med Study 1 or 2 day study: 1 day  Stress Test Type:  Treadmill/Lexiscan  Reading MD: Olga Millers, MD  Order Authorizing Provider:  Verne Carrow, MD  Resting Radionuclide: Technetium 15m Sestamibi  Resting Radionuclide Dose: 10.9 mCi   Stress Radionuclide:  Technetium 2m Sestamibi  Stress Radionuclide Dose: 33.0 mCi           Stress Protocol Rest HR: 58 Stress HR: 116  Rest BP: 107/59 Stress BP: 164/66  Exercise Time (min): 9:46 METS: n/a   Predicted Max HR: 152 bpm % Max HR: 76.32 bpm Rate Pressure Product: 78469   Dose of Adenosine (mg):  n/a Dose of Lexiscan: 0.4 mg  Dose of Atropine (mg): n/a Dose of Dobutamine: n/a mcg/kg/min (at max HR)  Stress Test Technologist: Milana Na, EMT-P  Nuclear Technologist:  Domenic Polite, CNMT     Rest Procedure:  Myocardial perfusion imaging was performed at rest 45 minutes following the intravenous administration of Technetium 34m Sestamibi. Rest ECG: Sinus  bradycardia.  Stress Procedure:  The patient received IV Lexiscan 0.4 mg over 15-seconds with concurrent low level exercise and then Technetium 61m Sestamibi was injected at 30-seconds while the patient continued walking one more minute. This patient got sob with Lexiscan. Quantitative spect images were obtained after a 45-minute delay. Stress ECG: Insignificant upsloping ST segment depression.  QPS Raw Data Images:  Acquisition technically good; normal left ventricular size. Stress Images:  Normal homogeneous uptake in all areas of the myocardium. Rest Images:  Normal homogeneous uptake in all areas of the myocardium. Subtraction (SDS):  No evidence of ischemia. Transient Ischemic Dilatation (Normal <1.22):  1.00 Lung/Heart Ratio (Normal <0.45):  0.38  Quantitative Gated Spect Images QGS EDV:  80 ml QGS ESV:  32 ml  Impression Exercise Capacity:  Lexiscan with low level exercise. BP Response:  Normal blood pressure response. Clinical Symptoms:  There is dyspnea. ECG Impression:  Insignificant upsloping ST segment depression. Comparison with Prior Nuclear Study: No images to compare  Overall Impression:  Normal stress nuclear study.  LV Ejection Fraction: 60%.  LV Wall Motion:  NL LV Function; NL Wall Motion  Olga Millers

## 2012-12-07 ENCOUNTER — Telehealth: Payer: Self-pay | Admitting: Cardiovascular Disease

## 2012-12-07 NOTE — Telephone Encounter (Signed)
Left a message to pt in phone # [phone] as he has requested: pt to call back.

## 2012-12-07 NOTE — Telephone Encounter (Addendum)
Pt rtn call re stress test results , pt called back cell not working pls call or leave a message on [phone]

## 2012-12-08 NOTE — Telephone Encounter (Signed)
Follow-up:    Patient called in returning Nivida's call.  Please call back.

## 2012-12-08 NOTE — Telephone Encounter (Signed)
Pt aware of stress test results.

## 2013-01-06 ENCOUNTER — Other Ambulatory Visit: Payer: Self-pay | Admitting: Nurse Practitioner

## 2013-02-17 ENCOUNTER — Other Ambulatory Visit: Payer: Self-pay | Admitting: *Deleted

## 2013-02-17 ENCOUNTER — Telehealth: Payer: Self-pay | Admitting: *Deleted

## 2013-02-17 NOTE — Telephone Encounter (Signed)
[facility] is calling to see if Dr Clifton James will refill patient's Escitalopram 20mg  once daily by mouth for 90 days. I let the associate know I will have to get further approval from Dr Clifton James and will get with his nurse. She said there might be Prior Authorization needed and gave me a call and reference number  [phone] ref# [id number]  Will forward to nurse for Dr's approval or refusal  Micki Riley, CMA

## 2013-02-17 NOTE — Telephone Encounter (Signed)
That should be filled by primary care. Thanks, chris

## 2013-03-15 ENCOUNTER — Other Ambulatory Visit: Payer: Self-pay | Admitting: *Deleted

## 2013-03-15 MED ORDER — SIMVASTATIN 40 MG PO TABS: 40.0000 mg | ORAL_TABLET | Freq: Every day | ORAL | Status: DC

## 2013-03-15 MED ORDER — ESOMEPRAZOLE MAGNESIUM 40 MG PO CPDR: 40.0000 mg | DELAYED_RELEASE_CAPSULE | Freq: Every day | ORAL | Status: DC

## 2013-03-15 MED ORDER — ESCITALOPRAM OXALATE 20 MG PO TABS: 20.0000 mg | ORAL_TABLET | Freq: Every day | ORAL | Status: DC

## 2013-03-15 NOTE — Telephone Encounter (Signed)
Fax Received. Refill Completed. Berdie Malter Chowoe (R.M.A)   

## 2013-09-30 ENCOUNTER — Ambulatory Visit: Payer: Self-pay | Admitting: Internal Medicine

## 2013-10-26 ENCOUNTER — Ambulatory Visit: Payer: Medicare Other | Admitting: Cardiovascular Disease

## 2013-11-19 ENCOUNTER — Ambulatory Visit (INDEPENDENT_AMBULATORY_CARE_PROVIDER_SITE_OTHER): Payer: Medicare Other | Admitting: Cardiovascular Disease

## 2013-11-19 ENCOUNTER — Encounter: Payer: Self-pay | Admitting: Cardiovascular Disease

## 2013-11-19 VITALS — BP 152/86 | HR 62 | Ht 68.0 in | Wt 171.0 lb

## 2013-11-19 DIAGNOSIS — I2510 Atherosclerotic heart disease of native coronary artery without angina pectoris: Secondary | ICD-10-CM

## 2013-11-19 NOTE — Patient Instructions (Signed)
Your physician wants you to follow-up in:  12 months.  You will receive a reminder letter in the mail two months in advance. If you don't receive a letter, please call our office to schedule the follow-up appointment.  Call us later today with the dose of Diovan you are currently taking

## 2013-11-19 NOTE — Progress Notes (Signed)
History of Present Illness: 68 yo WM with history of CAD, tobacco abuse, HTN, COPD here today for cardiac follow up. I saw him for the first time in December 2012. He is a former pt of Dr. Deborah Chalk. His heart history dates back to 1999 when he had a stent placed in the RCA. His last cath was in April 2010. The left main artery had 30% stenosis. There was mild disease in the LAD, no disease in the Circumflex. His RCA had a patent proximal stent. Stress myoview in 12/02/12 with no ischemia.   He has had no chest pain, SOB, palpitations. He continues to smoke 1ppd. 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 He is overall doing well.   Primary Care Physician: Barbette Reichmann  Last Lipid Profile:Lipid Panel     Component Value Date/Time   CHOL 133 11/05/2012 1231   TRIG 142.0 11/05/2012 1231   HDL 36.00* 11/05/2012 1231   CHOLHDL 4 11/05/2012 1231   VLDL 28.4 11/05/2012 1231   LDLCALC 69 11/05/2012 1231     Past Medical History  Diagnosis Date  . CAD (coronary artery disease)     Stent RCA 1999. Last cath April 2010 with patent RCA stent, 30% LM, mild plaque LAD and RCA  . Back pain     scoliosis  . Hypertension   . COPD (chronic obstructive pulmonary disease)   . Anemia     Past Surgical History  Procedure Laterality Date  . Appendectomy  1957  . Foot surgery  1973    left  . Cardiac catheterization  1999    stent placement RCA  . Cardiac catheterization  2010    mild left CA distal stenosis, mild irregularities in RCA and LAD. patent setn in RCA  . Back surgery      Current Outpatient Prescriptions  Medication Sig Dispense Refill  . acetaminophen (TYLENOL) 500 MG tablet Take 1,000 mg by mouth every 12 (twelve) hours as needed.        Marland Kitchen aspirin 325 MG tablet Take 325 mg by mouth daily.        Marland Kitchen escitalopram (LEXAPRO) 20 MG tablet Take 1 tablet (20 mg total) by mouth daily.  90 tablet  3  .  esomeprazole (NEXIUM) 40 MG capsule Take 1 capsule (40 mg total) by mouth daily before breakfast.  90 capsule  3  . ibuprofen (ADVIL,MOTRIN) 800 MG tablet Take 800 mg by mouth every 8 (eight) hours as needed. For pain      . montelukast (SINGULAIR) 10 MG tablet Take 10 mg by mouth at bedtime.      Marland Kitchen morphine (MS CONTIN) 15 MG 12 hr tablet Take 15 mg by mouth 2 (two) times daily.       Marland Kitchen oxyCODONE-acetaminophen (PERCOCET) 5-325 MG per tablet Take 1 tablet by mouth every 4 (four) hours as needed. For pain      . simvastatin (ZOCOR) 40 MG tablet Take 1 tablet (40 mg total) by mouth at bedtime.  90 tablet  3  . tiotropium (SPIRIVA) 18 MCG inhalation capsule Place 18 mcg into inhaler and inhale daily.      . valsartan (DIOVAN) 40 MG tablet Take 40 mg by mouth daily.       No current facility-administered medications for this visit.    Allergies  Allergen Reactions  . Levofloxacin Itching    History  Social History  . Marital Status: Divorced    Spouse Name: N/A    Number of Children: 3  . Years of Education: N/A   Occupational History  .     Social History Main Topics  . Smoking status: Current Every Day Smoker -- 1.00 packs/day for 35 years    Types: Cigarettes  . Smokeless tobacco: Never Used  . Alcohol Use: 3.5 oz/week    7 drink(s) per week  . Drug Use: No  . Sexual Activity: Not on file   Other Topics Concern  . Not on file   Social History Narrative  . No narrative on file    Family History  Problem Relation Age of Onset  . Alzheimer's disease Father   . Angina Father   . Diabetes Cousin     mothers side  . Heart attack Cousin     fathers side  . Hyperlipidemia Cousin     fathers side  . Hypertension Cousin     fathers side    Review of Systems:  As stated in the HPI and otherwise negative.   BP 152/86  Pulse 62  Ht 5\' 8"  (1.727 m)  Wt 171 lb (77.565 kg)  BMI 26.01 kg/m2  Physical Examination: General: Well developed, well nourished, NAD HEENT: OP  clear, mucus membranes moist SKIN: warm, dry. No rashes. Neuro: No focal deficits Musculoskeletal: Muscle strength 5/5 all ext Psychiatric: Mood and affect normal Neck: No JVD, no carotid bruits, no thyromegaly, no lymphadenopathy. Lungs:Clear bilaterally, no wheezes, rhonci, crackles Cardiovascular: Regular rate and rhythm. No murmurs, gallops or rubs. Abdomen:Soft. Bowel sounds present. Non-tender.  Extremities: No lower extremity edema. Pulses are 2 + in the bilateral DP/PT.  EKG: Sinus, rate 62 bpm.   Lexiscan stress myoview 12/02/12: Stress Procedure: The patient received IV Lexiscan 0.4 mg over 15-seconds with concurrent low level exercise and then Technetium 576m Sestamibi was injected at 30-seconds while the patient continued walking one more minute. This patient got sob with Lexiscan. Quantitative spect images were obtained after a 45-minute delay.  Stress ECG: Insignificant upsloping ST segment depression.  QPS  Raw Data Images: Acquisition technically good; normal left ventricular size.  Stress Images: Normal homogeneous uptake in all areas of the myocardium.  Rest Images: Normal homogeneous uptake in all areas of the myocardium.  Subtraction (SDS): No evidence of ischemia.  Transient Ischemic Dilatation (Normal <1.22): 1.00  Lung/Heart Ratio (Normal <0.45): 0.38  Quantitative Gated Spect Images  QGS EDV: 80 ml  QGS ESV: 32 ml  Impression  Exercise Capacity: Lexiscan with low level exercise.  BP Response: Normal blood pressure response.  Clinical Symptoms: There is dyspnea.  ECG Impression: Insignificant upsloping ST segment depression.  Comparison with Prior Nuclear Study: No images to compare  Overall Impression: Normal stress nuclear study.  LV Ejection Fraction: 60%. LV Wall Motion: NL LV Function; NL Wall Motion   Assessment and Plan:   1. CAD: Stable. No angina. Stress myoview 12/02/12 with no ischemia. Continue ASA. No beta blocker due to bradycardia.   2.  Tobacco abuse: Smoking cessation recommended. He wishes to stop.   3. HTN: BP elevated. Will increase Diovan. He will call back later with his dosage that he has been taking.   4. Hyperlipidemia: Lipids controlled January 2014. Continue statin. Will repeat lipids and LFTs today.

## 2013-11-22 LAB — HEPATIC FUNCTION PANEL
ALBUMIN: 4 g/dL (ref 3.5–5.2)
ALT: 13 U/L (ref 0–53)
AST: 18 U/L (ref 0–37)
Alkaline Phosphatase: 110 U/L (ref 39–117)
Bilirubin, Direct: 0.1 mg/dL (ref 0.0–0.3)
Total Bilirubin: 0.5 mg/dL (ref 0.3–1.2)
Total Protein: 7.3 g/dL (ref 6.0–8.3)

## 2013-11-22 LAB — BASIC METABOLIC PANEL
BUN: 10 mg/dL (ref 6–23)
CALCIUM: 8.8 mg/dL (ref 8.4–10.5)
CHLORIDE: 103 meq/L (ref 96–112)
CO2: 29 meq/L (ref 19–32)
Creatinine, Ser: 0.9 mg/dL (ref 0.4–1.5)
GFR: 88.7 mL/min (ref >60.00)
Glucose, Bld: 80 mg/dL (ref 70–99)
Potassium: 3.6 mEq/L (ref 3.5–5.1)
SODIUM: 138 meq/L (ref 135–145)

## 2013-11-22 LAB — LIPID PANEL
CHOL/HDL RATIO: 4
CHOLESTEROL: 148 mg/dL (ref 0–200)
HDL: 36.7 mg/dL — ABNORMAL LOW (ref >39.00)
TRIGLYCERIDES: 308 mg/dL — AB (ref 0.0–149.0)
VLDL: 61.6 mg/dL — AB (ref 0.0–40.0)

## 2013-11-22 LAB — LDL CHOLESTEROL, DIRECT: Direct LDL: 74.5 mg/dL

## 2013-11-23 ENCOUNTER — Telehealth: Payer: Self-pay | Admitting: *Deleted

## 2013-11-23 MED ORDER — VALSARTAN 160 MG PO TABS: 160.0000 mg | ORAL_TABLET | Freq: Every day | ORAL | Status: DC

## 2013-11-23 NOTE — Telephone Encounter (Signed)
Spoke with pt who reports he has been taking Diovan 80 mg daily. Dr. Clifton JamesMcAlhany would like pt to increase to 160 mg daily. Pt reports he has been on this dose in the past. Will send prescription to [facility].

## 2014-02-01 ENCOUNTER — Other Ambulatory Visit: Payer: Self-pay | Admitting: Cardiovascular Disease

## 2014-02-01 NOTE — Telephone Encounter (Signed)
Is this ok to refill? Thanks, MI

## 2014-02-02 ENCOUNTER — Other Ambulatory Visit: Payer: Self-pay

## 2014-02-02 MED ORDER — ESOMEPRAZOLE MAGNESIUM 40 MG PO CPDR: 40.0000 mg | DELAYED_RELEASE_CAPSULE | Freq: Every day | ORAL | Status: DC

## 2014-02-02 NOTE — Telephone Encounter (Signed)
This should be refilled by pt's primary care

## 2014-04-12 ENCOUNTER — Telehealth: Payer: Self-pay | Admitting: *Deleted

## 2014-04-12 MED ORDER — ESCITALOPRAM OXALATE 20 MG PO TABS: 20.0000 mg | ORAL_TABLET | Freq: Every day | ORAL | Status: DC

## 2014-04-12 NOTE — Telephone Encounter (Signed)
Message copied by Carmela HurtADAMS, KIMBERLY G on Tue Apr 12, 2014  4:29 PM ------      Message from: Verne CarrowMCALHANY, CHRISTOPHER D      Created: Tue Apr 12, 2014  1:56 PM      Regarding: RE: refill lexapro       That will be ok for now. He should ask primary care to refill in the future. Thayer Ohmhris            ----- Message -----         From: Carmela HurtKimberly G Adams, RN         Sent: 04/12/2014   8:34 AM           To: Dossie ArbourPatricia L Adelman, RN, #      Subject: refill lexapro                                           Dr Clifton JamesMcAlhany, is it OK to refill his lexapro? Thanks, Kim A.                  [facility]       ------

## 2014-06-22 ENCOUNTER — Other Ambulatory Visit: Payer: Self-pay | Admitting: Cardiovascular Disease

## 2014-10-11 ENCOUNTER — Other Ambulatory Visit: Payer: Self-pay | Admitting: Cardiovascular Disease

## 2014-10-26 ENCOUNTER — Other Ambulatory Visit: Payer: Self-pay | Admitting: Cardiovascular Disease

## 2014-12-20 ENCOUNTER — Other Ambulatory Visit: Payer: Self-pay | Admitting: Cardiovascular Disease

## 2015-01-05 ENCOUNTER — Other Ambulatory Visit: Payer: Self-pay | Admitting: Cardiovascular Disease

## 2015-01-09 ENCOUNTER — Encounter: Payer: Self-pay | Admitting: *Deleted

## 2015-01-09 ENCOUNTER — Ambulatory Visit (INDEPENDENT_AMBULATORY_CARE_PROVIDER_SITE_OTHER): Payer: Medicare Other | Admitting: Cardiovascular Disease

## 2015-01-09 ENCOUNTER — Encounter: Payer: Self-pay | Admitting: Cardiovascular Disease

## 2015-01-09 VITALS — BP 136/78 | HR 63 | Ht 68.0 in | Wt 170.8 lb

## 2015-01-09 DIAGNOSIS — E785 Hyperlipidemia, unspecified: Secondary | ICD-10-CM

## 2015-01-09 DIAGNOSIS — I25700 Atherosclerosis of coronary artery bypass graft(s), unspecified, with unstable angina pectoris: Secondary | ICD-10-CM

## 2015-01-09 DIAGNOSIS — I10 Essential (primary) hypertension: Secondary | ICD-10-CM | POA: Diagnosis not present

## 2015-01-09 DIAGNOSIS — Z720 Tobacco use: Secondary | ICD-10-CM

## 2015-01-09 LAB — CBC WITH DIFFERENTIAL/PLATELET
Basophils Absolute: 0 10*3/uL (ref 0.0–0.1)
Basophils Relative: 0.3 % (ref 0.0–3.0)
EOS PCT: 1.1 % (ref 0.0–5.0)
Eosinophils Absolute: 0.1 10*3/uL (ref 0.0–0.7)
HEMATOCRIT: 31.1 % — AB (ref 39.0–52.0)
Hemoglobin: 10.8 g/dL — ABNORMAL LOW (ref 13.0–17.0)
Lymphocytes Relative: 18.9 % (ref 12.0–46.0)
Lymphs Abs: 1.3 10*3/uL (ref 0.7–4.0)
MCHC: 34.6 g/dL (ref 30.0–36.0)
MCV: 96.6 fl (ref 78.0–100.0)
MONOS PCT: 6.1 % (ref 3.0–12.0)
Monocytes Absolute: 0.4 10*3/uL (ref 0.1–1.0)
Neutro Abs: 5.1 10*3/uL (ref 1.4–7.7)
Neutrophils Relative %: 73.6 % (ref 43.0–77.0)
Platelets: 231 10*3/uL (ref 150.0–400.0)
RBC: 3.23 Mil/uL — ABNORMAL LOW (ref 4.22–5.81)
RDW: 13.2 % (ref 11.5–15.5)
WBC: 6.9 10*3/uL (ref 4.0–10.5)

## 2015-01-09 LAB — BASIC METABOLIC PANEL
BUN: 19 mg/dL (ref 6–23)
CALCIUM: 9.2 mg/dL (ref 8.4–10.5)
CO2: 28 meq/L (ref 19–32)
Chloride: 105 mEq/L (ref 96–112)
Creatinine, Ser: 0.81 mg/dL (ref 0.40–1.50)
GFR: 99.85 mL/min (ref >60.00)
GLUCOSE: 99 mg/dL (ref 70–99)
Potassium: 4.1 mEq/L (ref 3.5–5.1)
SODIUM: 137 meq/L (ref 135–145)

## 2015-01-09 LAB — HEPATIC FUNCTION PANEL
ALT: 12 U/L (ref 0–53)
AST: 12 U/L (ref 0–37)
Albumin: 3.5 g/dL (ref 3.5–5.2)
Alkaline Phosphatase: 113 U/L (ref 39–117)
BILIRUBIN DIRECT: 0.1 mg/dL (ref 0.0–0.3)
BILIRUBIN TOTAL: 0.3 mg/dL (ref 0.2–1.2)
Total Protein: 6.8 g/dL (ref 6.0–8.3)

## 2015-01-09 LAB — LIPID PANEL
Cholesterol: 114 mg/dL (ref 0–200)
HDL: 32.3 mg/dL — ABNORMAL LOW (ref >39.00)
LDL Cholesterol: 55 mg/dL (ref 0–99)
NonHDL: 81.7
Total CHOL/HDL Ratio: 4
Triglycerides: 132 mg/dL (ref 0.0–149.0)
VLDL: 26.4 mg/dL (ref 0.0–40.0)

## 2015-01-09 LAB — PROTIME-INR
INR: 1.1 ratio — AB (ref 0.8–1.0)
PROTHROMBIN TIME: 11.7 s (ref 9.6–13.1)

## 2015-01-09 NOTE — Progress Notes (Signed)
History of Present Illness: 69 yo WM with history of CAD, tobacco abuse, HTN, COPD here today for cardiac follow up. I saw him for the first time in December 2012. He is a former pt of Dr. Deborah Chalk. His heart history dates back to 1999 when he had a stent placed in the RCA. His last cath was in April 2010. The left main artery had 30% stenosis. There was mild disease in the LAD, no disease in the Circumflex. His RCA had a patent proximal stent. Stress myoview in 12/02/12 with no ischemia. Diovan increased to 160 mg daily at last visit in 2015.   He is here today for follow up. He describes SOB and chest pain for last two weeks with radiation to his jaw. He also describes night sweats. Overall feeling poorly. Feels like he did before his last stent. He continues to smoke 1ppd. 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   Primary Care Physician: Barbette Reichmann  Last Lipid Profile:Lipid Panel     Component Value Date/Time   CHOL 148 11/19/2013 1107   TRIG 308.0* 11/19/2013 1107   HDL 36.70* 11/19/2013 1107   CHOLHDL 4 11/19/2013 1107   VLDL 61.6* 11/19/2013 1107   LDLCALC 69 11/05/2012 1231    Past Medical History  Diagnosis Date  . CAD (coronary artery disease)     Stent RCA 1999. Last cath April 2010 with patent RCA stent, 30% LM, mild plaque LAD and RCA  . Back pain     scoliosis  . Hypertension   . COPD (chronic obstructive pulmonary disease)   . Anemia     Past Surgical History  Procedure Laterality Date  . Appendectomy  1957  . Foot surgery  1973    left  . Cardiac catheterization  1999    stent placement RCA  . Cardiac catheterization  2010    mild left CA distal stenosis, mild irregularities in RCA and LAD. patent setn in RCA  . Back surgery      Current Outpatient Prescriptions  Medication Sig Dispense Refill  . acetaminophen (TYLENOL) 500 MG tablet Take 1,000 mg by mouth every 12 (twelve) hours as needed.      Marland Kitchen aspirin 325 MG tablet Take 325 mg by  mouth daily.      . cyanocobalamin 500 MCG tablet Take 500 mcg by mouth 2 (two) times daily.    Marland Kitchen DIOVAN 160 MG tablet TAKE 1 TABLET DAILY 90 tablet 2  . escitalopram (LEXAPRO) 20 MG tablet Take 1 tablet (20 mg total) by mouth daily. 90 tablet 0  . ibuprofen (ADVIL,MOTRIN) 800 MG tablet Take 800 mg by mouth every 8 (eight) hours as needed. For pain    . montelukast (SINGULAIR) 10 MG tablet Take 10 mg by mouth at bedtime.    Marland Kitchen morphine (MS CONTIN) 15 MG 12 hr tablet Take 15 mg by mouth 2 (two) times daily.     Marland Kitchen NEXIUM 40 MG capsule TAKE 1 CAPSULE DAILY BEFORE BREAKFAST 90 capsule 4  . oxyCODONE-acetaminophen (PERCOCET) 5-325 MG per tablet Take 1 tablet by mouth every 4 (four) hours as needed. For pain    . simvastatin (ZOCOR) 40 MG tablet TAKE 1 TABLET AT BEDTIME 90 tablet 0  . tiotropium (SPIRIVA) 18 MCG inhalation capsule Place 18 mcg into inhaler and inhale daily.    . Vitamin D, Ergocalciferol, (DRISDOL) 50000 UNITS CAPS capsule Take 50,000 Units by mouth once a week.  No current facility-administered medications for this visit.    Allergies  Allergen Reactions  . Levofloxacin Itching    History   Social History  . Marital Status: Divorced    Spouse Name: N/A  . Number of Children: 3  . Years of Education: N/A   Occupational History  .     Social History Main Topics  . Smoking status: Current Every Day Smoker -- 1.00 packs/day for 35 years    Types: Cigarettes  . Smokeless tobacco: Never Used  . Alcohol Use: 3.5 oz/week    7 drink(s) per week  . Drug Use: No  . Sexual Activity: Not on file   Other Topics Concern  . Not on file   Social History Narrative    Family History  Problem Relation Age of Onset  . Alzheimer's disease Father   . Angina Father   . Diabetes Cousin     mothers side  . Heart attack Cousin     fathers side  . Hyperlipidemia Cousin     fathers side  . Hypertension Cousin     fathers side    Review of Systems:  As stated in the HPI  and otherwise negative.   BP 136/78 mmHg  Pulse 63  Ht  (1.727 m)  Wt 170 lb 12.8 oz (77.474 kg)  BMI 25.98 kg/m2  Physical Examination: General: Well developed, well nourished, NAD HEENT: OP clear, mucus membranes moist SKIN: warm, dry. No rashes. Neuro: No focal deficits Musculoskeletal: Muscle strength 5/5 all ext Psychiatric: Mood and affect normal Neck: No JVD, no carotid bruits, no thyromegaly, no lymphadenopathy. Lungs:Clear bilaterally, no wheezes, rhonci, crackles Cardiovascular: Regular rate and rhythm. No murmurs, gallops or rubs. Abdomen:Soft. Bowel sounds present. Non-tender.  Extremities: No lower extremity edema. Pulses are 2 + in the bilateral DP/PT.  EKG: Sinus, rate 63 bpm.   Lexiscan stress myoview 12/02/12: Stress Procedure: The patient received IV Lexiscan 0.4 mg over 15-seconds with concurrent low level exercise and then Technetium 70m Sestamibi was injected at 30-seconds while the patient continued walking one more minute. This patient got sob with Lexiscan. Quantitative spect images were obtained after a 45-minute delay.  Stress ECG: Insignificant upsloping ST segment depression.  QPS  Raw Data Images: Acquisition technically good; normal left ventricular size.  Stress Images: Normal homogeneous uptake in all areas of the myocardium.  Rest Images: Normal homogeneous uptake in all areas of the myocardium.  Subtraction (SDS): No evidence of ischemia.  Transient Ischemic Dilatation (Normal <1.22): 1.00  Lung/Heart Ratio (Normal <0.45): 0.38  Quantitative Gated Spect Images  QGS EDV: 80 ml  QGS ESV: 32 ml  Impression  Exercise Capacity: Lexiscan with low level exercise.  BP Response: Normal blood pressure response.  Clinical Symptoms: There is dyspnea.  ECG Impression: Insignificant upsloping ST segment depression.  Comparison with Prior Nuclear Study: No images to compare  Overall Impression: Normal stress nuclear study.  LV Ejection Fraction: 60%.  LV Wall Motion: NL LV Function; NL Wall Motion  Assessment and Plan:   1. CAD/Unstable angina: Recent chest pains concerning for unstable angina. No cath since 2010 and he is known to have stent RCA and left main plaque. Will plan cardiac cath on 01/11/15 with possible PCI. Continue ASA and statin. No beta blocker due to bradycardia.   2. Tobacco abuse: Smoking cessation recommended. He wishes to stop.   3. HTN: BP controlled. No changes today.   4. Hyperlipidemia: Lipids controlled. Continue statin. Will repeat lipids and  LFTs today.

## 2015-01-09 NOTE — Patient Instructions (Addendum)
Your physician recommends that you schedule a follow-up appointment in: 4 weeks with NP or PA.    Lab work to be done today--BMP, CBC, PT, Lipid and Liver profiles   Your physician has requested that you have a cardiac catheterization. Cardiac catheterization is used to diagnose and/or treat various heart conditions. Doctors may recommend this procedure for a number of different reasons. The most common reason is to evaluate chest pain. Chest pain can be a symptom of coronary artery disease (CAD), and cardiac catheterization can show whether plaque is narrowing or blocking your heart's arteries. This procedure is also used to evaluate the valves, as well as measure the blood flow and oxygen levels in different parts of your heart. For further information please visit [email or web address]. Please follow instruction sheet, as given. Scheduled for January 11, 2015

## 2015-01-11 ENCOUNTER — Encounter (HOSPITAL_COMMUNITY)
Admission: RE | Disposition: A | Discharge status: Home / Self Care | Payer: Self-pay | Source: Ambulatory Visit | Attending: Cardiovascular Disease

## 2015-01-11 ENCOUNTER — Encounter (HOSPITAL_COMMUNITY): Payer: Self-pay | Admitting: Cardiovascular Disease

## 2015-01-11 ENCOUNTER — Ambulatory Visit (HOSPITAL_COMMUNITY)
Admission: RE | Admit: 2015-01-11 | Discharge: 2015-01-11 | Disposition: A | Discharge status: Home / Self Care | Payer: Medicare Other | Source: Ambulatory Visit | Attending: Cardiovascular Disease | Admitting: Cardiovascular Disease

## 2015-01-11 DIAGNOSIS — I25110 Atherosclerotic heart disease of native coronary artery with unstable angina pectoris: Secondary | ICD-10-CM

## 2015-01-11 DIAGNOSIS — F17210 Nicotine dependence, cigarettes, uncomplicated: Secondary | ICD-10-CM | POA: Diagnosis not present

## 2015-01-11 DIAGNOSIS — I2510 Atherosclerotic heart disease of native coronary artery without angina pectoris: Secondary | ICD-10-CM | POA: Diagnosis present

## 2015-01-11 DIAGNOSIS — Z9049 Acquired absence of other specified parts of digestive tract: Secondary | ICD-10-CM | POA: Diagnosis not present

## 2015-01-11 DIAGNOSIS — I10 Essential (primary) hypertension: Secondary | ICD-10-CM | POA: Diagnosis not present

## 2015-01-11 DIAGNOSIS — Z955 Presence of coronary angioplasty implant and graft: Secondary | ICD-10-CM | POA: Diagnosis not present

## 2015-01-11 DIAGNOSIS — I25700 Atherosclerosis of coronary artery bypass graft(s), unspecified, with unstable angina pectoris: Secondary | ICD-10-CM

## 2015-01-11 DIAGNOSIS — Z7982 Long term (current) use of aspirin: Secondary | ICD-10-CM | POA: Insufficient documentation

## 2015-01-11 DIAGNOSIS — J449 Chronic obstructive pulmonary disease, unspecified: Secondary | ICD-10-CM | POA: Insufficient documentation

## 2015-01-11 DIAGNOSIS — D649 Anemia, unspecified: Secondary | ICD-10-CM | POA: Insufficient documentation

## 2015-01-11 HISTORY — PX: LEFT HEART CATHETERIZATION WITH CORONARY ANGIOGRAM: SHX5451

## 2015-01-11 LAB — CK TOTAL AND CKMB (NOT AT ARMC)
CK, MB: 1.4 ng/mL (ref 0.3–4.0)
Relative Index: INVALID (ref 0.0–2.5)
Total CK: 60 U/L (ref 7–232)

## 2015-01-11 SURGERY — LEFT HEART CATHETERIZATION WITH CORONARY ANGIOGRAM

## 2015-01-11 MED ORDER — NITROGLYCERIN 1 MG/10 ML FOR IR/CATH LAB
INTRA_ARTERIAL | Status: AC
  Filled 2015-01-11: qty 10

## 2015-01-11 MED ORDER — HEPARIN (PORCINE) IN NACL 2-0.9 UNIT/ML-% IJ SOLN
INTRAMUSCULAR | Status: AC
  Filled 2015-01-11: qty 1500

## 2015-01-11 MED ORDER — MIDAZOLAM HCL 2 MG/2ML IJ SOLN
INTRAMUSCULAR | Status: AC
  Filled 2015-01-11: qty 2

## 2015-01-11 MED ORDER — FENTANYL CITRATE 0.05 MG/ML IJ SOLN
INTRAMUSCULAR | Status: AC
Start: 2015-01-11 — End: 2015-01-11
  Filled 2015-01-11: qty 2

## 2015-01-11 MED ORDER — SODIUM CHLORIDE 0.9 % IV SOLN: 250.0000 mL | INTRAVENOUS | Status: DC | PRN

## 2015-01-11 MED ORDER — HEPARIN SODIUM (PORCINE) 1000 UNIT/ML IJ SOLN
INTRAMUSCULAR | Status: AC
  Filled 2015-01-11: qty 1

## 2015-01-11 MED ORDER — LIDOCAINE HCL (PF) 1 % IJ SOLN
INTRAMUSCULAR | Status: AC
  Filled 2015-01-11: qty 30

## 2015-01-11 MED ORDER — VERAPAMIL HCL 2.5 MG/ML IV SOLN
INTRAVENOUS | Status: AC
  Filled 2015-01-11: qty 2

## 2015-01-11 MED ORDER — SODIUM CHLORIDE 0.9 % IJ SOLN: 3.0000 mL | INTRAMUSCULAR | Status: DC | PRN

## 2015-01-11 MED ORDER — SODIUM CHLORIDE 0.9 % IJ SOLN: 3.0000 mL | Freq: Two times a day (BID) | INTRAMUSCULAR | Status: DC

## 2015-01-11 MED ORDER — ASPIRIN 81 MG PO CHEW: 81.0000 mg | CHEWABLE_TABLET | ORAL | Status: DC

## 2015-01-11 MED ORDER — SODIUM CHLORIDE 0.9 % IV SOLN: INTRAVENOUS | Status: DC

## 2015-01-11 NOTE — Interval H&P Note (Signed)
History and Physical Interval Note:  01/11/2015 10:29 AM  [name]  has presented today for cardiac cath with the diagnosis of unstable angina. The various methods of treatment have been discussed with the patient and family. After consideration of risks, benefits and other options for treatment, the patient has consented to  Procedure(s): LEFT HEART CATHETERIZATION WITH CORONARY ANGIOGRAM (N/A) as a surgical intervention .  The patient's history has been reviewed, patient examined, no change in status, stable for surgery.  I have reviewed the patient's chart and labs.  Questions were answered to the patient's satisfaction.    Cath Lab Visit (complete for each Cath Lab visit)  Clinical Evaluation Leading to the Procedure:   ACS: No.  Non-ACS:    Anginal Classification: CCS III  Anti-ischemic medical therapy: Minimal Therapy (1 class of medications)  Non-Invasive Test Results: No non-invasive testing performed  Prior CABG: No previous CABG        [name]

## 2015-01-11 NOTE — Discharge Instructions (Signed)
Radial Site Care °Refer to this sheet in the next few weeks. These instructions provide you with information on caring for yourself after your procedure. Your caregiver may also give you more specific instructions. Your treatment has been planned according to current medical practices, but problems sometimes occur. Call your caregiver if you have any problems or questions after your procedure. °HOME CARE INSTRUCTIONS °· You may shower the day after the procedure. Remove the bandage (dressing) and gently wash the site with plain soap and water. Gently pat the site dry. °· Do not apply powder or lotion to the site. °· Do not submerge the affected site in water for 3 to 5 days. °· Inspect the site at least twice daily. °· Do not flex or bend the affected arm for 24 hours. °· No lifting over 5 pounds (2.3 kg) for 5 days after your procedure. °· Do not drive home if you are discharged the same day of the procedure. Have someone else drive you. °· You may drive 24 hours after the procedure unless otherwise instructed by your caregiver. °· Do not operate machinery or power tools for 24 hours. °· A responsible adult should be with you for the first 24 hours after you arrive home. °What to expect: °· Any bruising will usually fade within 1 to 2 weeks. °· Blood that collects in the tissue (hematoma) may be painful to the touch. It should usually decrease in size and tenderness within 1 to 2 weeks. °SEEK IMMEDIATE MEDICAL CARE IF: °· You have unusual pain at the radial site. °· You have redness, warmth, swelling, or pain at the radial site. °· You have drainage (other than a small amount of blood on the dressing). °· You have chills. °· You have a fever or persistent symptoms for more than 72 hours. °· You have a fever and your symptoms suddenly get worse. °· Your arm becomes pale, cool, tingly, or numb. °· You have heavy bleeding from the site. Hold pressure on the site. °Document Released: 11/02/2010 Document Revised:  12/23/2011 Document Reviewed: 11/02/2010 °ExitCare® Patient Information ©2015 [employer]. This information is not intended to replace advice given to you by your health care provider. Make sure you discuss any questions you have with your health care provider. ° °

## 2015-01-11 NOTE — CV Procedure (Signed)
      Cardiac Catheterization Operative Report  [name] [id number] 3/30/201610:34 AM [name],[name], MD  Procedure Performed:  1. Left Heart Catheterization 2. Selective Coronary Angiography 3. Left ventricular angiogram  Operator: [name] [name], MD  Arterial access site:  Right radial artery.   Indication: 69 yo male with history of CAD with prior RCA, HTN, tobacco abuse with recent chest pain concerning for unstable angina.                                      Procedure Details: The risks, benefits, complications, treatment options, and expected outcomes were discussed with the patient. The patient and/or family concurred with the proposed plan, giving informed consent. The patient was brought to the cath lab after IV hydration was begun and oral premedication was given. The patient was further sedated with Versed and Fentanyl. The right wrist was assessed with a modified Allens test which was positive. The right wrist was prepped and draped in a sterile fashion. 1% lidocaine was used for local anesthesia. Using the modified Seldinger access technique, a 5 French sheath was placed in the right radial artery. 3 mg Verapamil was given through the sheath. 4000 units IV heparin was given. Standard diagnostic catheters were used to perform selective coronary angiography. A pigtail catheter was used to perform a left ventricular angiogram. The sheath was removed from the right radial artery and a Terumo hemostasis band was applied at the arteriotomy site on the right wrist.   There were no immediate complications. The patient was taken to the recovery area in stable condition.   Hemodynamic Findings: Central aortic pressure: 140/65 Left ventricular pressure: 135/4/27  Angiographic Findings:  Left main: 40% calcified distal left main stenosis. Reviewed closely from multiple angles and unchanged from last cath.   Left Anterior Descending Artery: Large caliber vessel that  does not reach to the apex. Mild proximal plaque. Moderate caliber diagonal branch with no obstructive disease. Moderate caliber intermediate branch with no obstructive disease.   Circumflex Artery: Moderate caliber vessel with moderate caliber obtuse marginal. No obstructive disease.   Right Coronary Artery: Very large caliber dominant vessel with patent mid stent without restenosis. The moderate caliber posterolateral branch has proximal 40% stenosis. The large caliber PDA has no obstructive disease.   Left Ventricular Angiogram: LVEF=60-65%.   Impression: 1. Stable single vessel CAD with patent stent mid RCA 2. Mild distal left main stenosis, unchanged from last cath in 2010 3. Normal LV systolic function  Recommendations: Continue medical management of CAD. Smoking cessation is advised. If he continues to have exertional chest pain, will start Imdur.        Complications:  None. The patient tolerated the procedure well.

## 2015-01-11 NOTE — H&P (View-Only) (Signed)
  History of Present Illness: 68 yo WM with history of CAD, tobacco abuse, HTN, COPD here today for cardiac follow up. I saw him for the first time in December 2012. He is a former pt of Dr. Tennant. His heart history dates back to 1999 when he had a stent placed in the RCA. His last cath was in April 2010. The left main artery had 30% stenosis. There was mild disease in the LAD, no disease in the Circumflex. His RCA had a patent proximal stent. Stress myoview in 12/02/12 with no ischemia. Diovan increased to 160 mg daily at last visit in 2015.   He is here today for follow up. He describes SOB and chest pain for last two weeks with radiation to his jaw. He also describes night sweats. Overall feeling poorly. Feels like he did before his last stent. He continues to smoke 1ppd. 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   Primary Care Physician: [name] Hande  Last Lipid Profile:Lipid Panel     Component Value Date/Time   CHOL 148 11/19/2013 1107   TRIG 308.0* 11/19/2013 1107   HDL 36.70* 11/19/2013 1107   CHOLHDL 4 11/19/2013 1107   VLDL 61.6* 11/19/2013 1107   LDLCALC 69 11/05/2012 1231    Past Medical History  Diagnosis Date  . CAD (coronary artery disease)     Stent RCA 1999. Last cath April 2010 with patent RCA stent, 30% LM, mild plaque LAD and RCA  . Back pain     scoliosis  . Hypertension   . COPD (chronic obstructive pulmonary disease)   . Anemia     Past Surgical History  Procedure Laterality Date  . Appendectomy  1957  . Foot surgery  1973    left  . Cardiac catheterization  1999    stent placement RCA  . Cardiac catheterization  2010    mild left CA distal stenosis, mild irregularities in RCA and LAD. patent setn in RCA  . Back surgery      Current Outpatient Prescriptions  Medication Sig Dispense Refill  . acetaminophen (TYLENOL) 500 MG tablet Take 1,000 mg by mouth every 12 (twelve) hours as needed.      . aspirin 325 MG tablet Take 325 mg by  mouth daily.      . cyanocobalamin 500 MCG tablet Take 500 mcg by mouth 2 (two) times daily.    . DIOVAN 160 MG tablet TAKE 1 TABLET DAILY 90 tablet 2  . escitalopram (LEXAPRO) 20 MG tablet Take 1 tablet (20 mg total) by mouth daily. 90 tablet 0  . ibuprofen (ADVIL,MOTRIN) 800 MG tablet Take 800 mg by mouth every 8 (eight) hours as needed. For pain    . montelukast (SINGULAIR) 10 MG tablet Take 10 mg by mouth at bedtime.    . morphine (MS CONTIN) 15 MG 12 hr tablet Take 15 mg by mouth 2 (two) times daily.     . NEXIUM 40 MG capsule TAKE 1 CAPSULE DAILY BEFORE BREAKFAST 90 capsule 4  . oxyCODONE-acetaminophen (PERCOCET) 5-325 MG per tablet Take 1 tablet by mouth every 4 (four) hours as needed. For pain    . simvastatin (ZOCOR) 40 MG tablet TAKE 1 TABLET AT BEDTIME 90 tablet 0  . tiotropium (SPIRIVA) 18 MCG inhalation capsule Place 18 mcg into inhaler and inhale daily.    . Vitamin D, Ergocalciferol, (DRISDOL) 50000 UNITS CAPS capsule Take 50,000 Units by mouth once a week.       No current facility-administered medications for this visit.    Allergies  Allergen Reactions  . Levofloxacin Itching    History   Social History  . Marital Status: Divorced    Spouse Name: N/A  . Number of Children: 3  . Years of Education: N/A   Occupational History  .     Social History Main Topics  . Smoking status: Current Every Day Smoker -- 1.00 packs/day for 35 years    Types: Cigarettes  . Smokeless tobacco: Never Used  . Alcohol Use: 3.5 oz/week    7 drink(s) per week  . Drug Use: No  . Sexual Activity: Not on file   Other Topics Concern  . Not on file   Social History Narrative    Family History  Problem Relation Age of Onset  . Alzheimer's disease Father   . Angina Father   . Diabetes Cousin     mothers side  . Heart attack Cousin     fathers side  . Hyperlipidemia Cousin     fathers side  . Hypertension Cousin     fathers side    Review of Systems:  As stated in the HPI  and otherwise negative.   BP 136/78 mmHg  Pulse 63  Ht 5' 8" (1.727 m)  Wt 170 lb 12.8 oz (77.474 kg)  BMI 25.98 kg/m2  Physical Examination: General: Well developed, well nourished, NAD HEENT: OP clear, mucus membranes moist SKIN: warm, dry. No rashes. Neuro: No focal deficits Musculoskeletal: Muscle strength 5/5 all ext Psychiatric: Mood and affect normal Neck: No JVD, no carotid bruits, no thyromegaly, no lymphadenopathy. Lungs:Clear bilaterally, no wheezes, rhonci, crackles Cardiovascular: Regular rate and rhythm. No murmurs, gallops or rubs. Abdomen:Soft. Bowel sounds present. Non-tender.  Extremities: No lower extremity edema. Pulses are 2 + in the bilateral DP/PT.  EKG: Sinus, rate 63 bpm.   Lexiscan stress myoview 12/02/12: Stress Procedure: The patient received IV Lexiscan 0.4 mg over 15-seconds with concurrent low level exercise and then Technetium 99m Sestamibi was injected at 30-seconds while the patient continued walking one more minute. This patient got sob with Lexiscan. Quantitative spect images were obtained after a 45-minute delay.  Stress ECG: Insignificant upsloping ST segment depression.  QPS  Raw Data Images: Acquisition technically good; normal left ventricular size.  Stress Images: Normal homogeneous uptake in all areas of the myocardium.  Rest Images: Normal homogeneous uptake in all areas of the myocardium.  Subtraction (SDS): No evidence of ischemia.  Transient Ischemic Dilatation (Normal <1.22): 1.00  Lung/Heart Ratio (Normal <0.45): 0.38  Quantitative Gated Spect Images  QGS EDV: 80 ml  QGS ESV: 32 ml  Impression  Exercise Capacity: Lexiscan with low level exercise.  BP Response: Normal blood pressure response.  Clinical Symptoms: There is dyspnea.  ECG Impression: Insignificant upsloping ST segment depression.  Comparison with Prior Nuclear Study: No images to compare  Overall Impression: Normal stress nuclear study.  LV Ejection Fraction: 60%.  LV Wall Motion: NL LV Function; NL Wall Motion  Assessment and Plan:   1. CAD/Unstable angina: Recent chest pains concerning for unstable angina. No cath since 2010 and he is known to have stent RCA and left main plaque. Will plan cardiac cath on 01/11/15 with possible PCI. Continue ASA and statin. No beta blocker due to bradycardia.   2. Tobacco abuse: Smoking cessation recommended. He wishes to stop.   3. HTN: BP controlled. No changes today.   4. Hyperlipidemia: Lipids controlled. Continue statin. Will repeat lipids and   LFTs today.  

## 2015-01-11 NOTE — Research (Signed)
Bioflow Informed Consent   Subject Name: [name]  Subject met inclusion and exclusion criteria.  The informed consent form, study requirements and expectations were reviewed with the subject and questions and concerns were addressed prior to the signing of the consent form.  The subject verbalized understanding of the trail requirements.  The subject agreed to participate in the Bioflow trial and signed the informed consent.  The informed consent was obtained prior to performance of any protocol-specific procedures for the subject.  A copy of the signed informed consent was given to the subject and a copy was placed in the subject's medical record.    01/11/2015, 8;55  

## 2015-02-16 ENCOUNTER — Ambulatory Visit: Payer: Medicare Other | Admitting: Physician Assistant

## 2015-02-21 ENCOUNTER — Encounter: Payer: Self-pay | Admitting: Cardiovascular Disease

## 2015-02-21 ENCOUNTER — Ambulatory Visit (INDEPENDENT_AMBULATORY_CARE_PROVIDER_SITE_OTHER): Payer: Medicare Other | Admitting: Cardiovascular Disease

## 2015-02-21 VITALS — BP 150/80 | HR 83 | Ht 68.0 in | Wt 164.8 lb

## 2015-02-21 DIAGNOSIS — I2510 Atherosclerotic heart disease of native coronary artery without angina pectoris: Secondary | ICD-10-CM | POA: Diagnosis not present

## 2015-02-21 DIAGNOSIS — I10 Essential (primary) hypertension: Secondary | ICD-10-CM | POA: Diagnosis not present

## 2015-02-21 DIAGNOSIS — E785 Hyperlipidemia, unspecified: Secondary | ICD-10-CM

## 2015-02-21 DIAGNOSIS — Z720 Tobacco use: Secondary | ICD-10-CM | POA: Diagnosis not present

## 2015-02-21 DIAGNOSIS — I25700 Atherosclerosis of coronary artery bypass graft(s), unspecified, with unstable angina pectoris: Secondary | ICD-10-CM

## 2015-02-21 NOTE — Progress Notes (Signed)
Chief Complaint  Patient presents with  . Coronary Artery Disease    History of Present Illness: 69 yo WM with history of CAD, tobacco abuse, HTN, COPD here today for cardiac follow up. I saw him for the first time in December 2012. He is a former pt of Dr. Deborah Chalkennant. His heart history dates back to 1999 when he had a stent placed in the RCA. His last cath was in April 2010. The left main artery had 30% stenosis. There was mild disease in the LAD, no disease in the Circumflex. His RCA had a patent proximal stent. Stress myoview in 12/02/12 with no ischemia. Diovan increased to 160 mg daily at visit in 2015. Seen here March 2016 with c/o chest pain. Cardiac cath 01/11/15 with stable CAD.   He is here today for follow up. No chest pain or SOB. He continues to smoke 1ppd. 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   Primary Care Physician: Barbette ReichmannVishwanath Hande  Last Lipid Profile:Lipid Panel     Component Value Date/Time   CHOL 114 01/09/2015 1022   TRIG 132.0 01/09/2015 1022   HDL 32.30* 01/09/2015 1022   CHOLHDL 4 01/09/2015 1022   VLDL 26.4 01/09/2015 1022   LDLCALC 55 01/09/2015 1022    Past Medical History  Diagnosis Date  . CAD (coronary artery disease)     Stent RCA 1999. Last cath April 2010 with patent RCA stent, 30% LM, mild plaque LAD and RCA  . Back pain     scoliosis  . Hypertension   . COPD (chronic obstructive pulmonary disease)   . Anemia     Past Surgical History  Procedure Laterality Date  . Appendectomy  1957  . Foot surgery  1973    left  . Cardiac catheterization  1999    stent placement RCA  . Cardiac catheterization  2010    mild left CA distal stenosis, mild irregularities in RCA and LAD. patent setn in RCA  . Back surgery    . Left heart catheterization with coronary angiogram N/A 01/11/2015    Procedure: LEFT HEART CATHETERIZATION WITH CORONARY ANGIOGRAM;  Surgeon: Kathleene Hazelhristopher D Abelardo Seidner, MD;  [facility];  Service: Cardiovascular;   Laterality: N/A;    Current Outpatient Prescriptions  Medication Sig Dispense Refill  . acetaminophen (TYLENOL) 500 MG tablet Take 1,000 mg by mouth every 12 (twelve) hours as needed for mild pain, fever or headache.     Marland Kitchen. aspirin 325 MG tablet Take 325 mg by mouth daily.      . cyanocobalamin 500 MCG tablet Take 500 mcg by mouth 2 (two) times daily.    Marland Kitchen. escitalopram (LEXAPRO) 20 MG tablet Take 1 tablet (20 mg total) by mouth daily. 90 tablet 0  . montelukast (SINGULAIR) 10 MG tablet Take 10 mg by mouth daily.     Marland Kitchen. morphine (MS CONTIN) 15 MG 12 hr tablet Take 15 mg by mouth 2 (two) times daily.     Marland Kitchen. NEXIUM 40 MG capsule TAKE 1 CAPSULE DAILY BEFORE BREAKFAST 90 capsule 4  . oxyCODONE-acetaminophen (PERCOCET) 5-325 MG per tablet Take 1 tablet by mouth every 4 (four) hours as needed. For pain    . simvastatin (ZOCOR) 40 MG tablet Take 40 mg by mouth daily.    Marland Kitchen. tiotropium (SPIRIVA) 18 MCG inhalation capsule Place 18 mcg into inhaler and inhale daily.    . valsartan (DIOVAN) 160 MG tablet Take 160 mg by mouth daily.    .Marland Kitchen  Vitamin D, Ergocalciferol, (DRISDOL) 50000 UNITS CAPS capsule Take 50,000 Units by mouth once a week.     No current facility-administered medications for this visit.    Allergies  Allergen Reactions  . Levofloxacin Itching    History   Social History  . Marital Status: Divorced    Spouse Name: N/A  . Number of Children: 3  . Years of Education: N/A   Occupational History  .     Social History Main Topics  . Smoking status: Current Every Day Smoker -- 1.00 packs/day for 35 years    Types: Cigarettes  . Smokeless tobacco: Never Used  . Alcohol Use: 3.5 oz/week    7 drink(s) per week  . Drug Use: No  . Sexual Activity: Not on file   Other Topics Concern  . Not on file   Social History Narrative    Family History  Problem Relation Age of Onset  . Alzheimer's disease Father   . Angina Father   . Diabetes Cousin     mothers side  . Heart attack  Cousin     fathers side  . Hyperlipidemia Cousin     fathers side  . Hypertension Cousin     fathers side    Review of Systems:  As stated in the HPI and otherwise negative.   BP 150/80 mmHg  Pulse 83  Ht  (1.727 m)  Wt 164 lb 12.8 oz (74.753 kg)  BMI 25.06 kg/m2  Physical Examination: General: Well developed, well nourished, NAD HEENT: OP clear, mucus membranes moist SKIN: warm, dry. No rashes. Neuro: No focal deficits Musculoskeletal: Muscle strength 5/5 all ext Psychiatric: Mood and affect normal Neck: No JVD, no carotid bruits, no thyromegaly, no lymphadenopathy. Lungs:Clear bilaterally, no wheezes, rhonci, crackles Cardiovascular: Regular rate and rhythm. No murmurs, gallops or rubs. Abdomen:Soft. Bowel sounds present. Non-tender.  Extremities: No lower extremity edema. Pulses are 2 + in the bilateral DP/PT.  Lexiscan stress myoview 12/02/12: Stress Procedure: The patient received IV Lexiscan 0.4 mg over 15-seconds with concurrent low level exercise and then Technetium 26m Sestamibi was injected at 30-seconds while the patient continued walking one more minute. This patient got sob with Lexiscan. Quantitative spect images were obtained after a 45-minute delay.  Stress ECG: Insignificant upsloping ST segment depression.  QPS  Raw Data Images: Acquisition technically good; normal left ventricular size.  Stress Images: Normal homogeneous uptake in all areas of the myocardium.  Rest Images: Normal homogeneous uptake in all areas of the myocardium.  Subtraction (SDS): No evidence of ischemia.  Transient Ischemic Dilatation (Normal <1.22): 1.00  Lung/Heart Ratio (Normal <0.45): 0.38  Quantitative Gated Spect Images  QGS EDV: 80 ml  QGS ESV: 32 ml  Impression  Exercise Capacity: Lexiscan with low level exercise.  BP Response: Normal blood pressure response.  Clinical Symptoms: There is dyspnea.  ECG Impression: Insignificant upsloping ST segment depression.    Comparison with Prior Nuclear Study: No images to compare  Overall Impression: Normal stress nuclear study.  LV Ejection Fraction: 60%. LV Wall Motion: NL LV Function; NL Wall Motion  Cardiac cath 01/11/15: Left main: 40% calcified distal left main stenosis. Reviewed closely from multiple angles and unchanged from last cath.  Left Anterior Descending Artery: Large caliber vessel that does not reach to the apex. Mild proximal plaque. Moderate caliber diagonal branch with no obstructive disease. Moderate caliber intermediate branch with no obstructive disease.  Circumflex Artery: Moderate caliber vessel with moderate caliber obtuse marginal. No obstructive disease.  Right Coronary Artery: Very large caliber dominant vessel with patent mid stent without restenosis. The moderate caliber posterolateral branch has proximal 40% stenosis. The large caliber PDA has no obstructive disease.  Left Ventricular Angiogram: LVEF=60-65%.  Impression: 1. Stable single vessel CAD with patent stent mid RCA 2. Mild distal left main stenosis, unchanged from last cath in 2010 3. Normal LV systolic function  EKG:  EKG is not ordered today. The ekg ordered today demonstrates   Recent Labs: 01/09/2015: ALT 12; BUN 19; Creatinine 0.81; Hemoglobin 10.8*; Platelets 231.0; Potassium 4.1; Sodium 137   Lipid Panel    Component Value Date/Time   CHOL 114 01/09/2015 1022   TRIG 132.0 01/09/2015 1022   HDL 32.30* 01/09/2015 1022   CHOLHDL 4 01/09/2015 1022   VLDL 26.4 01/09/2015 1022   LDLCALC 55 01/09/2015 1022   LDLDIRECT 74.5 11/19/2013 1107     Wt Readings from Last 3 Encounters:  02/21/15 164 lb 12.8 oz (74.753 kg)  01/09/15 170 lb 12.8 oz (77.474 kg)  11/19/13 171 lb (77.565 kg)     Other studies Reviewed: Additional studies/ records that were reviewed today include: . Review of the above records demonstrates:    Assessment and Plan:   1. CAD/Unstable angina: Recent chest pains concerning for  unstable angina. No cath since 2010 and he is known to have stent RCA and left main plaque. Will plan cardiac cath on 01/11/15 with possible PCI. Continue ASA and statin. No beta blocker due to bradycardia.   2. Tobacco abuse: Smoking cessation recommended. He wishes to stop.   3. HTN: BP controlled. No changes today.   4. Hyperlipidemia: Lipids controlled. Continue statin. Will repeat lipids and LFTs today.   Current medicines are reviewed at length with the patient today.  The patient does not have concerns regarding medicines.  The following changes have been made:  no change  Labs/ tests ordered today include:  No orders of the defined types were placed in this encounter.    Disposition:   FU with me in 6 months  Signed, [name] Bless Lisenby, MD 02/21/2015 2:27 PM    [facility] [address], [place], [place]  [address] Phone: [phone]; Fax: [phone]

## 2015-02-21 NOTE — Patient Instructions (Signed)
Medication Instructions:  Your physician recommends that you continue on your current medications as directed. Please refer to the Current Medication list given to you today.   Labwork: none  Testing/Procedures: none  Follow-Up: Your physician wants you to follow-up in: 6 months.  You will receive a reminder letter in the mail two months in advance. If you don't receive a letter, please call our office to schedule the follow-up appointment.       

## 2015-03-03 ENCOUNTER — Other Ambulatory Visit: Payer: Self-pay | Admitting: Internal Medicine

## 2015-03-03 DIAGNOSIS — R413 Other amnesia: Secondary | ICD-10-CM

## 2015-03-07 ENCOUNTER — Ambulatory Visit: Admission: RE | Admit: 2015-03-07 | Payer: Medicare Other | Source: Ambulatory Visit | Admitting: Internal Medicine

## 2015-03-22 ENCOUNTER — Ambulatory Visit
Admission: RE | Admit: 2015-03-22 | Discharge: 2015-03-22 | Disposition: A | Discharge status: Home / Self Care | Payer: Medicare Other | Source: Ambulatory Visit | Attending: Internal Medicine | Admitting: Internal Medicine

## 2015-03-22 DIAGNOSIS — R413 Other amnesia: Secondary | ICD-10-CM | POA: Insufficient documentation

## 2015-04-04 ENCOUNTER — Other Ambulatory Visit: Payer: Self-pay | Admitting: Cardiovascular Disease

## 2015-05-19 ENCOUNTER — Other Ambulatory Visit: Payer: Self-pay | Admitting: Gastroenterology

## 2015-05-19 ENCOUNTER — Ambulatory Visit
Admission: RE | Admit: 2015-05-19 | Discharge: 2015-05-19 | Disposition: A | Discharge status: Home / Self Care | Payer: Medicare Other | Source: Ambulatory Visit | Attending: Gastroenterology | Admitting: Gastroenterology

## 2015-05-19 DIAGNOSIS — J84112 Idiopathic pulmonary fibrosis: Secondary | ICD-10-CM | POA: Insufficient documentation

## 2015-05-19 DIAGNOSIS — R1030 Lower abdominal pain, unspecified: Secondary | ICD-10-CM

## 2015-05-19 MED ORDER — IOHEXOL 350 MG/ML SOLN
100.0000 mL | Freq: Once | INTRAVENOUS | Status: AC | PRN
  Administered 2015-05-19: 85 mL via INTRAVENOUS

## 2015-06-20 ENCOUNTER — Other Ambulatory Visit: Payer: Self-pay | Admitting: Cardiovascular Disease

## 2015-07-06 ENCOUNTER — Encounter: Admission: RE | Payer: Self-pay | Source: Ambulatory Visit

## 2015-07-06 ENCOUNTER — Ambulatory Visit: Admission: RE | Admit: 2015-07-06 | Payer: Medicare Other | Source: Ambulatory Visit | Admitting: Gastroenterology

## 2015-07-06 SURGERY — COLONOSCOPY
Anesthesia: General

## 2015-09-17 ENCOUNTER — Other Ambulatory Visit: Payer: Self-pay | Admitting: Cardiovascular Disease

## 2015-12-13 ENCOUNTER — Ambulatory Visit: Payer: Medicare Other | Admitting: Cardiovascular Disease

## 2016-03-15 ENCOUNTER — Other Ambulatory Visit: Payer: Self-pay | Admitting: Cardiovascular Disease

## 2016-04-01 ENCOUNTER — Other Ambulatory Visit: Payer: Self-pay | Admitting: Cardiovascular Disease

## 2016-06-06 ENCOUNTER — Encounter: Payer: Self-pay | Admitting: Cardiovascular Disease

## 2016-06-06 ENCOUNTER — Ambulatory Visit (INDEPENDENT_AMBULATORY_CARE_PROVIDER_SITE_OTHER): Payer: Medicare Other | Admitting: Cardiovascular Disease

## 2016-06-06 VITALS — BP 140/80 | HR 68 | Ht 68.0 in | Wt 163.8 lb

## 2016-06-06 DIAGNOSIS — I2510 Atherosclerotic heart disease of native coronary artery without angina pectoris: Secondary | ICD-10-CM

## 2016-06-06 DIAGNOSIS — Z720 Tobacco use: Secondary | ICD-10-CM

## 2016-06-06 DIAGNOSIS — I10 Essential (primary) hypertension: Secondary | ICD-10-CM | POA: Diagnosis not present

## 2016-06-06 DIAGNOSIS — E785 Hyperlipidemia, unspecified: Secondary | ICD-10-CM | POA: Diagnosis not present

## 2016-06-06 MED ORDER — ASPIRIN EC 81 MG PO TBEC: 81.0000 mg | DELAYED_RELEASE_TABLET | Freq: Every day | ORAL | 3 refills | Status: DC

## 2016-06-06 NOTE — Patient Instructions (Signed)
Medication Instructions:  Your physician has recommended you make the following change in your medication:  Decrease aspirin to 81 mg by mouth daily   Labwork: none  Testing/Procedures: none  Follow-Up: Your physician wants you to follow-up in: 12 months.  You will receive a reminder letter in the mail two months in advance. If you don't receive a letter, please call our office to schedule the follow-up appointment.   Any Other Special Instructions Will Be Listed Below (If Applicable).     If you need a refill on your cardiac medications before your next appointment, please call your pharmacy.   

## 2016-06-06 NOTE — Progress Notes (Signed)
Chief Complaint  Patient presents with  . Coronary Artery Disease    History of Present Illness: 70 yo WM with history of CAD, tobacco abuse, HTN, COPD here today for cardiac follow up. I saw him for the first time in December 2012. He is a former pt of Dr. Deborah Chalk. His heart history dates back to 1999 when he had a stent placed in the RCA. Stress myoview in 12/02/12 with no ischemia. Seen here March 2016 with c/o chest pain. Cardiac cath 01/11/15 with stable CAD.   He is here today for follow up. No chest pain or SOB. He continues to smoke 1ppd. 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   Primary Care Physician: Barbette Reichmann, MD  Past Medical History:  Diagnosis Date  . Anemia   . Back pain    scoliosis  . CAD (coronary artery disease)    Stent RCA 1999. Last cath April 2010 with patent RCA stent, 30% LM, mild plaque LAD and RCA  . COPD (chronic obstructive pulmonary disease) (HCC)   . Hypertension     Past Surgical History:  Procedure Laterality Date  . APPENDECTOMY  1957  . BACK SURGERY    . CARDIAC CATHETERIZATION  1999   stent placement RCA  . CARDIAC CATHETERIZATION  2010   mild left CA distal stenosis, mild irregularities in RCA and LAD. patent setn in RCA  . FOOT SURGERY  1973   left  . LEFT HEART CATHETERIZATION WITH CORONARY ANGIOGRAM N/A 01/11/2015   Procedure: LEFT HEART CATHETERIZATION WITH CORONARY ANGIOGRAM;  Surgeon: Kathleene Hazel, MD;  Location: [facility] CATH LAB;  Service: Cardiovascular;  Laterality: N/A;    Current Outpatient Prescriptions  Medication Sig Dispense Refill  . acetaminophen (TYLENOL) 500 MG tablet Take 1,000 mg by mouth every 12 (twelve) hours as needed for mild pain, fever or headache.     . cyanocobalamin 500 MCG tablet Take 500 mcg by mouth 2 (two) times daily.    Marland Kitchen escitalopram (LEXAPRO) 20 MG tablet Take 1 tablet (20 mg total) by mouth daily. 90 tablet 0  . esomeprazole (NEXIUM) 40 MG capsule Take 40 mg by mouth  daily before breakfast.    . montelukast (SINGULAIR) 10 MG tablet Take 10 mg by mouth daily.     Marland Kitchen morphine (MS CONTIN) 15 MG 12 hr tablet Take 15 mg by mouth 2 (two) times daily.     Marland Kitchen oxyCODONE-acetaminophen (PERCOCET) 5-325 MG per tablet Take 1 tablet by mouth every 4 (four) hours as needed. For pain    . simvastatin (ZOCOR) 40 MG tablet Take 40 mg by mouth daily.    . simvastatin (ZOCOR) 40 MG tablet Take 1 tablet (40 mg total) by mouth at bedtime. 90 tablet 0  . tiotropium (SPIRIVA) 18 MCG inhalation capsule Place 18 mcg into inhaler and inhale daily.    . valsartan (DIOVAN) 160 MG tablet Take 160 mg by mouth daily.    . Vitamin D, Ergocalciferol, (DRISDOL) 50000 UNITS CAPS capsule Take 50,000 Units by mouth once a week.    Marland Kitchen aspirin EC 81 MG tablet Take 1 tablet (81 mg total) by mouth daily. 90 tablet 3   No current facility-administered medications for this visit.     Allergies  Allergen Reactions  . Levofloxacin Itching  . Levofloxacin Rash    Social History   Social History  . Marital status: Divorced    Spouse name: N/A  . Number of children: 3  .  Years of education: N/A   Occupational History  .  Retired   Social History Main Topics  . Smoking status: Current Every Day Smoker    Packs/day: 1.00    Years: 35.00    Types: Cigarettes  . Smokeless tobacco: Never Used  . Alcohol use 3.5 oz/week    7 drink(s) per week  . Drug use: No  . Sexual activity: Not on file   Other Topics Concern  . Not on file   Social History Narrative  . No narrative on file    Family History  Problem Relation Age of Onset  . Alzheimer's disease Father   . Angina Father   . Diabetes Cousin     mothers side  . Heart attack Cousin     fathers side  . Hyperlipidemia Cousin     fathers side  . Hypertension Cousin     fathers side    Review of Systems:  As stated in the HPI and otherwise negative.   BP 140/80   Pulse 68   Ht 5\' 8"  (1.727 m)   Wt 163 lb 12.8 oz (74.3 kg)    BMI 24.91 kg/m   Physical Examination: General: Well developed, well nourished, NAD  HEENT: OP clear, mucus membranes moist  SKIN: warm, dry. No rashes. Neuro: No focal deficits  Musculoskeletal: Muscle strength 5/5 all ext  Psychiatric: Mood and affect normal  Neck: No JVD, no carotid bruits, no thyromegaly, no lymphadenopathy.  Lungs:Clear bilaterally, no wheezes, rhonci, crackles Cardiovascular: Regular rate and rhythm. No murmurs, gallops or rubs. Abdomen:Soft. Bowel sounds present. Non-tender.  Extremities: No lower extremity edema. Pulses are 2 + in the bilateral DP/PT.  Lexiscan stress myoview 12/02/12: Stress Procedure: The patient received IV Lexiscan 0.4 mg over 15-seconds with concurrent low level exercise and then Technetium 38m Sestamibi was injected at 30-seconds while the patient continued walking one more minute. This patient got sob with Lexiscan. Quantitative spect images were obtained after a 45-minute delay.  Stress ECG: Insignificant upsloping ST segment depression.  QPS  Raw Data Images: Acquisition technically good; normal left ventricular size.  Stress Images: Normal homogeneous uptake in all areas of the myocardium.  Rest Images: Normal homogeneous uptake in all areas of the myocardium.  Subtraction (SDS): No evidence of ischemia.  Transient Ischemic Dilatation (Normal <1.22): 1.00  Lung/Heart Ratio (Normal <0.45): 0.38  Quantitative Gated Spect Images  QGS EDV: 80 ml  QGS ESV: 32 ml  Impression  Exercise Capacity: Lexiscan with low level exercise.  BP Response: Normal blood pressure response.  Clinical Symptoms: There is dyspnea.  ECG Impression: Insignificant upsloping ST segment depression.  Comparison with Prior Nuclear Study: No images to compare  Overall Impression: Normal stress nuclear study.  LV Ejection Fraction: 60%. LV Wall Motion: NL LV Function; NL Wall Motion  Cardiac cath 01/11/15: Left main: 40% calcified distal left main stenosis.  Reviewed closely from multiple angles and unchanged from last cath.  Left Anterior Descending Artery: Large caliber vessel that does not reach to the apex. Mild proximal plaque. Moderate caliber diagonal branch with no obstructive disease. Moderate caliber intermediate branch with no obstructive disease.  Circumflex Artery: Moderate caliber vessel with moderate caliber obtuse marginal. No obstructive disease.  Right Coronary Artery: Very large caliber dominant vessel with patent mid stent without restenosis. The moderate caliber posterolateral branch has proximal 40% stenosis. The large caliber PDA has no obstructive disease.  Left Ventricular Angiogram: LVEF=60-65%.  Impression: 1. Stable single vessel CAD with patent  stent mid RCA 2. Mild distal left main stenosis, unchanged from last cath in 2010 3. Normal LV systolic function  EKG:  EKG is ordered today. The ekg ordered today demonstrates NSR, rate 69 bpm. QTC 480 msec  Recent Labs: No results found for requested labs within last 8760 hours.   [facility]   Wt Readings from Last 3 Encounters:  06/06/16 163 lb 12.8 oz (74.3 kg)  03/22/15 165 lb (74.8 kg)  02/21/15 164 lb 12.8 oz (74.8 kg)     Other studies Reviewed: Additional studies/ records that were reviewed today include: . Review of the above records demonstrates:    Assessment and Plan:   1. CAD/Unstable angina:  He has no chest pain suggestive of angina. Cardiac cath 2016 with stable CAD. Continue ASA and statin. No beta blocker due to bradycardia.   2. Tobacco abuse: Smoking cessation recommended. He wishes to stop. He is starting a program at the [facility].   3. HTN: BP controlled. No changes today.   4. Hyperlipidemia: Lipids controlled per pt and followed in the [facility]. Continue statin.    Current medicines are reviewed at length with the patient today.  The patient does not have concerns regarding medicines.  The following changes have been made:   no change  Labs/ tests ordered today include:   Orders Placed This Encounter  Procedures  . EKG 12-Lead    Disposition:   FU with me in 12 months  Signed, [name] Tal Kempker, MD 06/06/2016 3:40 PM    [facility] [address], [place], [place]  [address] Phone: [phone]; Fax: [phone]

## 2016-06-13 ENCOUNTER — Other Ambulatory Visit: Payer: Self-pay | Admitting: Cardiovascular Disease

## 2016-06-24 ENCOUNTER — Telehealth: Payer: Self-pay

## 2016-06-24 NOTE — Telephone Encounter (Signed)
Received a request for prior auth for Nexium 40mg . Attempted to call patient about this medicine today and last week. I will wait to hear from him, or discard this PA

## 2016-06-27 ENCOUNTER — Telehealth: Payer: Self-pay | Admitting: Cardiovascular Disease

## 2016-07-01 ENCOUNTER — Other Ambulatory Visit: Payer: Self-pay | Admitting: Cardiovascular Disease

## 2016-07-02 ENCOUNTER — Ambulatory Visit
Admission: RE | Admit: 2016-07-02 | Discharge: 2016-07-02 | Disposition: A | Discharge status: Home / Self Care | Payer: Medicare Other | Source: Ambulatory Visit | Attending: Internal Medicine | Admitting: Internal Medicine

## 2016-07-02 ENCOUNTER — Other Ambulatory Visit: Payer: Self-pay | Admitting: Internal Medicine

## 2016-07-02 DIAGNOSIS — R05 Cough: Secondary | ICD-10-CM

## 2016-07-02 DIAGNOSIS — R059 Cough, unspecified: Secondary | ICD-10-CM

## 2016-07-02 DIAGNOSIS — R918 Other nonspecific abnormal finding of lung field: Secondary | ICD-10-CM | POA: Insufficient documentation

## 2016-07-02 DIAGNOSIS — M4184 Other forms of scoliosis, thoracic region: Secondary | ICD-10-CM | POA: Diagnosis not present

## 2016-07-08 ENCOUNTER — Telehealth: Payer: Self-pay

## 2016-07-08 NOTE — Telephone Encounter (Signed)
Left message to call back  

## 2016-07-08 NOTE — Telephone Encounter (Signed)
I would have him check with primary care. He will need to check with insurance and see which one is covered. We can help fill it if he needs us to do this . Thanks, chris

## 2016-07-08 NOTE — Telephone Encounter (Signed)
Follow up ° ° ° °Pt verbalized that he is returning call for rn °

## 2016-07-08 NOTE — Telephone Encounter (Signed)
Nexium denied by [facility]. He has not tried any other PPI's.

## 2016-07-08 NOTE — Telephone Encounter (Signed)
Pt has been on Nexium for many years. I placed call to pt to discuss and left message to call back.

## 2016-07-18 NOTE — Telephone Encounter (Signed)
Spoke with pt. He reports insurance will cover omeprazole and pantoprazole. Pt is willing to try one of these.

## 2016-07-19 MED ORDER — PANTOPRAZOLE SODIUM 40 MG PO TBEC: 40.0000 mg | DELAYED_RELEASE_TABLET | Freq: Every day | ORAL | 3 refills | Status: DC

## 2016-07-19 NOTE — Telephone Encounter (Signed)
I spoke to patient and advised of rx change. He voiced understanding and thanks.

## 2016-07-19 NOTE — Telephone Encounter (Signed)
Can we start Protonix 40 mg daily? Thanks, chris

## 2016-07-19 NOTE — Telephone Encounter (Signed)
LMTCB. I dc'd Nexium and sent new rx for Protonix to pt's pharmacy.

## 2016-11-14 ENCOUNTER — Telehealth: Payer: Self-pay | Admitting: Cardiovascular Disease

## 2016-11-14 NOTE — Telephone Encounter (Signed)
New Message    Per pt he is having with a [facility], they sent him a generic for Nexium , protonix is what they sent him and it is causing the burning sensation worse , so he needs you to call them and tell them that it has to be the Nexium non generic form and nothing else.  [facility] needs a prior authorization and new script

## 2016-11-15 ENCOUNTER — Other Ambulatory Visit: Payer: Self-pay | Admitting: *Deleted

## 2016-11-15 MED ORDER — ESOMEPRAZOLE MAGNESIUM 40 MG PO CPDR: 40.0000 mg | DELAYED_RELEASE_CAPSULE | Freq: Every day | ORAL | 3 refills | Status: DC

## 2016-11-15 NOTE — Telephone Encounter (Signed)
I spoke with pt. He had been on Nexium for many years and this controlled his reflux.  Due to insurance coverage he was changed to Pantoprazole recently.  Pt reports problems with severe reflux recently. He is having trouble eating due to burning and things tasting sour. He reports even a sip of coffee will cause burning and this morning a small sip caused him to vomit. Pt is clear this is different from his heart pain.  Pt is asking us to [facility] for prior auth so he can be changed back to esomeprazole.  I told pt I thought he needed to follow up with primary care due to severity of these symptoms but he does not think he can get in to see them quickly and requests we [facility] and send them prescription. OTC Nexium is too expensive for him. I told pt I would change him back to esomeprazole 40 mg daily and contact [facility].  He reports he will contact primary care to schedule appt for evaluation. I [facility] ([facility] [phone]) and gave prior auth information to rep. I was told esomeprazole would not be covered because pt must first try pantoprazole, omeprazole and rabeprazole. I spoke with pt and gave him information from [facility].  I encouraged him to go to Urgent Care if unable to see primary provider.

## 2017-02-13 DIAGNOSIS — J432 Centrilobular emphysema: Secondary | ICD-10-CM | POA: Insufficient documentation

## 2017-02-13 DIAGNOSIS — F3342 Major depressive disorder, recurrent, in full remission: Secondary | ICD-10-CM | POA: Insufficient documentation

## 2017-02-13 DIAGNOSIS — I10 Essential (primary) hypertension: Secondary | ICD-10-CM | POA: Insufficient documentation

## 2017-02-13 DIAGNOSIS — G8929 Other chronic pain: Secondary | ICD-10-CM | POA: Insufficient documentation

## 2017-02-13 DIAGNOSIS — M5441 Lumbago with sciatica, right side: Secondary | ICD-10-CM

## 2017-02-13 DIAGNOSIS — K219 Gastro-esophageal reflux disease without esophagitis: Secondary | ICD-10-CM | POA: Insufficient documentation

## 2017-09-12 ENCOUNTER — Telehealth: Payer: Self-pay | Admitting: Cardiovascular Disease

## 2017-09-12 ENCOUNTER — Other Ambulatory Visit: Payer: Self-pay | Admitting: Cardiovascular Disease

## 2017-09-12 MED ORDER — VALSARTAN 160 MG PO TABS: 160.0000 mg | ORAL_TABLET | Freq: Every day | ORAL | 0 refills | Status: DC

## 2017-09-12 NOTE — Telephone Encounter (Signed)
Pt's medication was resent to pt's pharmacy as requested. Confirmation received.  °

## 2017-09-12 NOTE — Telephone Encounter (Signed)
New Message   *STAT* If patient is at the pharmacy, call can be transferred to refill team.   1. Which medications need to be refilled? (please list name of each medication and dose if known) Valsartan 160mg    2. Which pharmacy/location (including street and city if local pharmacy) is [facility]  3. Do they need a 30 day or 90 day supply? 90

## 2017-09-12 NOTE — Addendum Note (Signed)
Addended by: Demetrios LollBARNARD, CATHY C on: 09/12/2017 12:34 PM   Modules accepted: Orders

## 2017-10-13 NOTE — Telephone Encounter (Signed)
error 

## 2017-12-15 ENCOUNTER — Ambulatory Visit: Payer: Medicare Other | Admitting: Cardiovascular Disease

## 2017-12-15 ENCOUNTER — Telehealth: Payer: Self-pay | Admitting: Cardiovascular Disease

## 2017-12-15 MED ORDER — VALSARTAN 160 MG PO TABS: 160.0000 mg | ORAL_TABLET | Freq: Every day | ORAL | 0 refills | Status: DC

## 2017-12-15 NOTE — Telephone Encounter (Signed)
New Message    *STAT* If patient is at the pharmacy, call can be transferred to refill team.   1. Which medications need to be refilled? (please list name of each medication and dose if known) valsartan (DIOVAN) 160 MG tablet 2. Which pharmacy/location (including street and city if local pharmacy) is medication to be sent to? [facility]  3. Do they need a 30 day or 90 day supply? 90

## 2017-12-15 NOTE — Telephone Encounter (Signed)
Pt's medication was sent to pt's pharmacy as requested. Confirmation received.  °

## 2018-01-05 NOTE — H&P (View-Only) (Signed)
Cardiology Office Note:    Date:  01/06/2018   ID:  [name], DOB [birth year], MRN [id number]  PCP:  Barbette Reichmann, MD  Cardiologist:  Verne Carrow, MD   Referring MD: Barbette Reichmann, MD   Chief Complaint  Patient presents with  . Follow-up    CAD    History of Present Illness:    [name] is a 72 y.o. male with a hx of CAD s/p RCA stent placed in 1999 and heart cath 01/11/15 with stable disease, tobacco use, HTN, HLD, and COPD. He last saw Dr. Clifton James in clinic on 06/06/16. At that time, he was in his usual state of health and was still active as a Patent attorney. He is not on a beta blocker for bradycardia. No medication changes made at that visit.   He presents today for clinic follow up. He has been taking 325 mg ASA, zocor, and valsartan. He has several problems today. He still smokes and it not interested in quitting. His pressure is well-controlled today. He states he has been having neck pain with exertion, relieved with rest, that reminds him of the neck pain he had in 1999 prior to his stents. He says this has happened 2-3 times over the past two months. He is active as a Patent attorney. He also describes trouble swallowing, night sweats, and an unintentional weight loss of 9 lbs since December. He describes shoulder pain in both shoulders in his shoulder joints that hurt when he changes position in bed.    Past Medical History:  Diagnosis Date  . Anemia   . Back pain    scoliosis  . CAD (coronary artery disease)    Stent RCA 1999. Last cath April 2010 with patent RCA stent, 30% LM, mild plaque LAD and RCA  . COPD (chronic obstructive pulmonary disease) (HCC)   . Hypertension     Past Surgical History:  Procedure Laterality Date  . APPENDECTOMY  1957  . BACK SURGERY    . CARDIAC CATHETERIZATION  1999   stent placement RCA  . CARDIAC CATHETERIZATION  2010   mild left CA distal stenosis, mild irregularities in RCA and LAD. patent setn in RCA  .  FOOT SURGERY  1973   left  . LEFT HEART CATHETERIZATION WITH CORONARY ANGIOGRAM N/A 01/11/2015   Procedure: LEFT HEART CATHETERIZATION WITH CORONARY ANGIOGRAM;  Surgeon: Kathleene Hazel, MD;  Location: [facility] CATH LAB;  Service: Cardiovascular;  Laterality: N/A;    Current Medications: Current Meds  Medication Sig  . acetaminophen (TYLENOL) 500 MG tablet Take 1,000 mg by mouth every 12 (twelve) hours as needed for mild pain, fever or headache (sleep).   . cyanocobalamin 500 MCG tablet Take 500 mcg by mouth 2 (two) times daily.  Marland Kitchen escitalopram (LEXAPRO) 20 MG tablet Take 1 tablet (20 mg total) by mouth daily.  Marland Kitchen esomeprazole (NEXIUM) 40 MG capsule Take 1 capsule (40 mg total) by mouth daily.  . montelukast (SINGULAIR) 10 MG tablet Take 10 mg by mouth daily.   Marland Kitchen morphine (MS CONTIN) 15 MG 12 hr tablet Take 15 mg by mouth 2 (two) times daily.   Marland Kitchen oxyCODONE-acetaminophen (PERCOCET) 5-325 MG per tablet Take 1 tablet by mouth every 4 (four) hours as needed. For pain  . tiotropium (SPIRIVA) 18 MCG inhalation capsule Place 18 mcg into inhaler and inhale daily.  . valsartan (DIOVAN) 160 MG tablet Take 1 tablet (160 mg total) by mouth daily. Please keep upcoming appt before anymore refills.  Thank you  . Vitamin D, Ergocalciferol, (DRISDOL) 50000 UNITS CAPS capsule Take 50,000 Units by mouth once a week.  . [DISCONTINUED] aspirin EC 81 MG tablet Take 1 tablet (81 mg total) by mouth daily.  . [DISCONTINUED] simvastatin (ZOCOR) 40 MG tablet TAKE 1 TABLET AT BEDTIME     Allergies:   Levofloxacin and Levofloxacin   Social History   Socioeconomic History  . Marital status: Divorced    Spouse name: Not on file  . Number of children: 3  . Years of education: Not on file  . Highest education level: Not on file  Occupational History    Employer: RETIRED  Social Needs  . Financial resource strain: Not on file  . Food insecurity:    Worry: Not on file    Inability: Not on file  . Transportation  needs:    Medical: Not on file    Non-medical: Not on file  Tobacco Use  . Smoking status: Current Every Day Smoker    Packs/day: 1.00    Years: 35.00    Pack years: 35.00    Types: Cigarettes  . Smokeless tobacco: Never Used  Substance and Sexual Activity  . Alcohol use: Yes    Alcohol/week: 3.5 oz    Types: 7 drink(s) per week  . Drug use: No  . Sexual activity: Not on file  Lifestyle  . Physical activity:    Days per week: Not on file    Minutes per session: Not on file  . Stress: Not on file  Relationships  . Social connections:    Talks on phone: Not on file    Gets together: Not on file    Attends religious service: Not on file    Active member of club or organization: Not on file    Attends meetings of clubs or organizations: Not on file    Relationship status: Not on file  Other Topics Concern  . Not on file  Social History Narrative  . Not on file     Family History: The patient's family history includes Alzheimer's disease in his father; Angina in his father; Diabetes in his cousin; Heart attack in his cousin; Hyperlipidemia in his cousin; Hypertension in his cousin.  ROS:   Please see the history of present illness.    All other systems reviewed and are negative.  EKGs/Labs/Other Studies Reviewed:    The following studies were reviewed today:  Cardiac cath 01/11/15: Left main: 40% calcified distal left main stenosis. Reviewed closely from multiple angles and unchanged from last cath.  Left Anterior Descending Artery: Large caliber vessel that does not reach to the apex. Mild proximal plaque. Moderate caliber diagonal branch with no obstructive disease. Moderate caliber intermediate branch with no obstructive disease.  Circumflex Artery: Moderate caliber vessel with moderate caliber obtuse marginal. No obstructive disease.  Right Coronary Artery: Very large caliber dominant vessel with patent mid stent without restenosis. The moderate caliber  posterolateral branch has proximal 40% stenosis. The large caliber PDA has no obstructive disease.  Left Ventricular Angiogram: LVEF=60-65%.  Impression: 1. Stable single vessel CAD with patent stent mid RCA 2. Mild distal left main stenosis, unchanged from last cath in 2010 3. Normal LV systolic function   EKG:  EKG is ordered today.  The ekg ordered today demonstrates sinus rhythm with PVC  Recent Labs: No results found for requested labs within last 8760 hours.  Recent Lipid Panel    Component Value Date/Time   CHOL 114 01/09/2015 1022  TRIG 132.0 01/09/2015 1022   HDL 32.30 (L) 01/09/2015 1022   CHOLHDL 4 01/09/2015 1022   VLDL 26.4 01/09/2015 1022   LDLCALC 55 01/09/2015 1022   LDLDIRECT 74.5 11/19/2013 1107    Physical Exam:    VS:  BP 126/78   Pulse 69   Ht 5\' 8"  (1.727 m)   Wt 156 lb (70.8 kg)   BMI 23.72 kg/m     Wt Readings from Last 3 Encounters:  01/06/18 156 lb (70.8 kg)  06/06/16 163 lb 12.8 oz (74.3 kg)  03/22/15 165 lb (74.8 kg)     GEN: Well nourished, well developed in no acute distress HEENT: Normal NECK: No JVD; No carotid bruits LYMPHATICS: No lymphadenopathy CARDIAC: RRR, no murmurs, rubs, gallops RESPIRATORY:  Respirations unlabored, wheezes throughout ABDOMEN: Soft, non-tender, non-distended MUSCULOSKELETAL:  No edema; No deformity  SKIN: Warm and dry NEUROLOGIC:  Alert and oriented x 3 PSYCHIATRIC:  Normal affect   ASSESSMENT:    1. CAD in native artery   2. Essential hypertension   3. Hypercholesterolemia   4. Tobacco user   5. Night sweats   6. Weight loss   7. Lump in throat     PLAN:    In order of problems listed above:  CAD in native artery He describes neck and throat pain that is similar to his previous angina prior to his stents. The neck pain is exertional and relieved with rest.  In consultation with Dr. Eldridge DaceVaranasi, we recommend repeat left heart catheterization. Will schedule with Dr. Clifton JamesMcAlhany. Will D/C ASA 325 mg  and start 81 mg ASA. Patient is reluctant to to change ASA dose since he just bought a bottle of 500. Dr. Eldridge DaceVaranasi suggested he at least cut them in half. He is agreeable.   The patient understands that risks included but are not limited to stroke (1 in 1000), death (1 in 1000), kidney failure [usually temporary] (1 in 500), bleeding (1 in 200), allergic reaction [possibly serious] (1 in 200).  The patient understands and agrees to proceed.    Essential hypertension  Pressure is well-controlled on valsartan. No changes.  Hypercholesterolemia Pt had lipid profile in 07/2017 in Care Everywhere. LDL was 76. Will change zocor to 40 mg crestor to achieve LDL goal of less than 70. I asked him to repeat fasting lipids and LFTs 2 months after starting crestor 40 mg.   Tobacco user He is uninterested in smoking cessation.   Night sweats, unintentional weight loss, trouble swallowing Given his night sweats, unintentional weight loss, recent throat pain/sensation of lump/trouble swallowing, and smoking history, I am concerned for malignancy. I have asked him to follow up with his pulmonary doctor and will refer him to GI. TSH was normal 07/2017. Will send for chest xray.    Medication Adjustments/Labs and Tests Ordered: Current medicines are reviewed at length with the patient today.  Concerns regarding medicines are outlined above.  Orders Placed This Encounter  Procedures  . DG Chest 2 View  . Hepatic function panel  . Lipid panel  . CBC with Differential/Platelet  . Basic metabolic panel  . Protime-INR  . Ambulatory referral to Gastroenterology  . EKG 12-Lead   Meds ordered this encounter  Medications  . aspirin EC 81 MG tablet    Sig: Take 1 tablet (81 mg total) by mouth daily.    Dispense:  90 tablet    Refill:  3  . rosuvastatin (CRESTOR) 40 MG tablet    Sig: Take 1  tablet (40 mg total) by mouth daily.    Dispense:  90 tablet    Refill:  3    Signed, Marcelino Duster, [place]    01/06/2018 4:13 PM    [facility]

## 2018-01-05 NOTE — Progress Notes (Addendum)
Cardiology Office Note:    Date:  01/06/2018   ID:  [name], DOB [birth year], MRN [id number]  PCP:  Barbette Reichmann, MD  Cardiologist:  Verne Carrow, MD   Referring MD: Barbette Reichmann, MD   Chief Complaint  Patient presents with  . Follow-up    CAD    History of Present Illness:    [name] is a 72 y.o. male with a hx of CAD s/p RCA stent placed in 1999 and heart cath 01/11/15 with stable disease, tobacco use, HTN, HLD, and COPD. He last saw Dr. Clifton James in clinic on 06/06/16. At that time, he was in his usual state of health and was still active as a Patent attorney. He is not on a beta blocker for bradycardia. No medication changes made at that visit.   He presents today for clinic follow up. He has been taking 325 mg ASA, zocor, and valsartan. He has several problems today. He still smokes and it not interested in quitting. His pressure is well-controlled today. He states he has been having neck pain with exertion, relieved with rest, that reminds him of the neck pain he had in 1999 prior to his stents. He says this has happened 2-3 times over the past two months. He is active as a Patent attorney. He also describes trouble swallowing, night sweats, and an unintentional weight loss of 9 lbs since December. He describes shoulder pain in both shoulders in his shoulder joints that hurt when he changes position in bed.    Past Medical History:  Diagnosis Date  . Anemia   . Back pain    scoliosis  . CAD (coronary artery disease)    Stent RCA 1999. Last cath April 2010 with patent RCA stent, 30% LM, mild plaque LAD and RCA  . COPD (chronic obstructive pulmonary disease) (HCC)   . Hypertension     Past Surgical History:  Procedure Laterality Date  . APPENDECTOMY  1957  . BACK SURGERY    . CARDIAC CATHETERIZATION  1999   stent placement RCA  . CARDIAC CATHETERIZATION  2010   mild left CA distal stenosis, mild irregularities in RCA and LAD. patent setn in RCA  .  FOOT SURGERY  1973   left  . LEFT HEART CATHETERIZATION WITH CORONARY ANGIOGRAM N/A 01/11/2015   Procedure: LEFT HEART CATHETERIZATION WITH CORONARY ANGIOGRAM;  Surgeon: Kathleene Hazel, MD;  Location: [facility] CATH LAB;  Service: Cardiovascular;  Laterality: N/A;    Current Medications: Current Meds  Medication Sig  . acetaminophen (TYLENOL) 500 MG tablet Take 1,000 mg by mouth every 12 (twelve) hours as needed for mild pain, fever or headache (sleep).   . cyanocobalamin 500 MCG tablet Take 500 mcg by mouth 2 (two) times daily.  Marland Kitchen escitalopram (LEXAPRO) 20 MG tablet Take 1 tablet (20 mg total) by mouth daily.  Marland Kitchen esomeprazole (NEXIUM) 40 MG capsule Take 1 capsule (40 mg total) by mouth daily.  . montelukast (SINGULAIR) 10 MG tablet Take 10 mg by mouth daily.   Marland Kitchen morphine (MS CONTIN) 15 MG 12 hr tablet Take 15 mg by mouth 2 (two) times daily.   Marland Kitchen oxyCODONE-acetaminophen (PERCOCET) 5-325 MG per tablet Take 1 tablet by mouth every 4 (four) hours as needed. For pain  . tiotropium (SPIRIVA) 18 MCG inhalation capsule Place 18 mcg into inhaler and inhale daily.  . valsartan (DIOVAN) 160 MG tablet Take 1 tablet (160 mg total) by mouth daily. Please keep upcoming appt before anymore refills.  Thank you  . Vitamin D, Ergocalciferol, (DRISDOL) 50000 UNITS CAPS capsule Take 50,000 Units by mouth once a week.  . [DISCONTINUED] aspirin EC 81 MG tablet Take 1 tablet (81 mg total) by mouth daily.  . [DISCONTINUED] simvastatin (ZOCOR) 40 MG tablet TAKE 1 TABLET AT BEDTIME     Allergies:   Levofloxacin and Levofloxacin   Social History   Socioeconomic History  . Marital status: Divorced    Spouse name: Not on file  . Number of children: 3  . Years of education: Not on file  . Highest education level: Not on file  Occupational History    Employer: RETIRED  Social Needs  . Financial resource strain: Not on file  . Food insecurity:    Worry: Not on file    Inability: Not on file  . Transportation  needs:    Medical: Not on file    Non-medical: Not on file  Tobacco Use  . Smoking status: Current Every Day Smoker    Packs/day: 1.00    Years: 35.00    Pack years: 35.00    Types: Cigarettes  . Smokeless tobacco: Never Used  Substance and Sexual Activity  . Alcohol use: Yes    Alcohol/week: 3.5 oz    Types: 7 drink(s) per week  . Drug use: No  . Sexual activity: Not on file  Lifestyle  . Physical activity:    Days per week: Not on file    Minutes per session: Not on file  . Stress: Not on file  Relationships  . Social connections:    Talks on phone: Not on file    Gets together: Not on file    Attends religious service: Not on file    Active member of club or organization: Not on file    Attends meetings of clubs or organizations: Not on file    Relationship status: Not on file  Other Topics Concern  . Not on file  Social History Narrative  . Not on file     Family History: The patient's family history includes Alzheimer's disease in his father; Angina in his father; Diabetes in his cousin; Heart attack in his cousin; Hyperlipidemia in his cousin; Hypertension in his cousin.  ROS:   Please see the history of present illness.    All other systems reviewed and are negative.  EKGs/Labs/Other Studies Reviewed:    The following studies were reviewed today:  Cardiac cath 01/11/15: Left main: 40% calcified distal left main stenosis. Reviewed closely from multiple angles and unchanged from last cath.  Left Anterior Descending Artery: Large caliber vessel that does not reach to the apex. Mild proximal plaque. Moderate caliber diagonal branch with no obstructive disease. Moderate caliber intermediate branch with no obstructive disease.  Circumflex Artery: Moderate caliber vessel with moderate caliber obtuse marginal. No obstructive disease.  Right Coronary Artery: Very large caliber dominant vessel with patent mid stent without restenosis. The moderate caliber  posterolateral branch has proximal 40% stenosis. The large caliber PDA has no obstructive disease.  Left Ventricular Angiogram: LVEF=60-65%.  Impression: 1. Stable single vessel CAD with patent stent mid RCA 2. Mild distal left main stenosis, unchanged from last cath in 2010 3. Normal LV systolic function   EKG:  EKG is ordered today.  The ekg ordered today demonstrates sinus rhythm with PVC  Recent Labs: No results found for requested labs within last 8760 hours.  Recent Lipid Panel    Component Value Date/Time   CHOL 114 01/09/2015 1022  TRIG 132.0 01/09/2015 1022   HDL 32.30 (L) 01/09/2015 1022   CHOLHDL 4 01/09/2015 1022   VLDL 26.4 01/09/2015 1022   LDLCALC 55 01/09/2015 1022   LDLDIRECT 74.5 11/19/2013 1107    Physical Exam:    VS:  BP 126/78   Pulse 69   Ht 5\' 8"  (1.727 m)   Wt 156 lb (70.8 kg)   BMI 23.72 kg/m     Wt Readings from Last 3 Encounters:  01/06/18 156 lb (70.8 kg)  06/06/16 163 lb 12.8 oz (74.3 kg)  03/22/15 165 lb (74.8 kg)     GEN: Well nourished, well developed in no acute distress HEENT: Normal NECK: No JVD; No carotid bruits LYMPHATICS: No lymphadenopathy CARDIAC: RRR, no murmurs, rubs, gallops RESPIRATORY:  Respirations unlabored, wheezes throughout ABDOMEN: Soft, non-tender, non-distended MUSCULOSKELETAL:  No edema; No deformity  SKIN: Warm and dry NEUROLOGIC:  Alert and oriented x 3 PSYCHIATRIC:  Normal affect   ASSESSMENT:    1. CAD in native artery   2. Essential hypertension   3. Hypercholesterolemia   4. Tobacco user   5. Night sweats   6. Weight loss   7. Lump in throat     PLAN:    In order of problems listed above:  CAD in native artery He describes neck and throat pain that is similar to his previous angina prior to his stents. The neck pain is exertional and relieved with rest.  In consultation with Dr. Eldridge DaceVaranasi, we recommend repeat left heart catheterization. Will schedule with Dr. Clifton JamesMcAlhany. Will D/C ASA 325 mg  and start 81 mg ASA. Patient is reluctant to to change ASA dose since he just bought a bottle of 500. Dr. Eldridge DaceVaranasi suggested he at least cut them in half. He is agreeable.   The patient understands that risks included but are not limited to stroke (1 in 1000), death (1 in 1000), kidney failure [usually temporary] (1 in 500), bleeding (1 in 200), allergic reaction [possibly serious] (1 in 200).  The patient understands and agrees to proceed.    Essential hypertension  Pressure is well-controlled on valsartan. No changes.  Hypercholesterolemia Pt had lipid profile in 07/2017 in Care Everywhere. LDL was 76. Will change zocor to 40 mg crestor to achieve LDL goal of less than 70. I asked him to repeat fasting lipids and LFTs 2 months after starting crestor 40 mg.   Tobacco user He is uninterested in smoking cessation.   Night sweats, unintentional weight loss, trouble swallowing Given his night sweats, unintentional weight loss, recent throat pain/sensation of lump/trouble swallowing, and smoking history, I am concerned for malignancy. I have asked him to follow up with his pulmonary doctor and will refer him to GI. TSH was normal 07/2017. Will send for chest xray.    Medication Adjustments/Labs and Tests Ordered: Current medicines are reviewed at length with the patient today.  Concerns regarding medicines are outlined above.  Orders Placed This Encounter  Procedures  . DG Chest 2 View  . Hepatic function panel  . Lipid panel  . CBC with Differential/Platelet  . Basic metabolic panel  . Protime-INR  . Ambulatory referral to Gastroenterology  . EKG 12-Lead   Meds ordered this encounter  Medications  . aspirin EC 81 MG tablet    Sig: Take 1 tablet (81 mg total) by mouth daily.    Dispense:  90 tablet    Refill:  3  . rosuvastatin (CRESTOR) 40 MG tablet    Sig: Take 1  tablet (40 mg total) by mouth daily.    Dispense:  90 tablet    Refill:  3    Signed, Marcelino Duster, [place]    01/06/2018 4:13 PM    [facility]

## 2018-01-06 ENCOUNTER — Encounter: Payer: Self-pay | Admitting: Cardiology

## 2018-01-06 ENCOUNTER — Ambulatory Visit (INDEPENDENT_AMBULATORY_CARE_PROVIDER_SITE_OTHER): Payer: Medicare Other | Admitting: Physician Assistant

## 2018-01-06 VITALS — BP 126/78 | HR 69 | Ht 68.0 in | Wt 156.0 lb

## 2018-01-06 DIAGNOSIS — I2510 Atherosclerotic heart disease of native coronary artery without angina pectoris: Secondary | ICD-10-CM | POA: Diagnosis not present

## 2018-01-06 DIAGNOSIS — R634 Abnormal weight loss: Secondary | ICD-10-CM

## 2018-01-06 DIAGNOSIS — E7800 Pure hypercholesterolemia, unspecified: Secondary | ICD-10-CM

## 2018-01-06 DIAGNOSIS — R61 Generalized hyperhidrosis: Secondary | ICD-10-CM

## 2018-01-06 DIAGNOSIS — I10 Essential (primary) hypertension: Secondary | ICD-10-CM | POA: Diagnosis not present

## 2018-01-06 DIAGNOSIS — R221 Localized swelling, mass and lump, neck: Secondary | ICD-10-CM

## 2018-01-06 DIAGNOSIS — Z720 Tobacco use: Secondary | ICD-10-CM

## 2018-01-06 MED ORDER — ASPIRIN EC 81 MG PO TBEC: 81.0000 mg | DELAYED_RELEASE_TABLET | Freq: Every day | ORAL | 3 refills | Status: DC

## 2018-01-06 MED ORDER — ROSUVASTATIN CALCIUM 40 MG PO TABS: 40.0000 mg | ORAL_TABLET | Freq: Every day | ORAL | 3 refills | Status: DC

## 2018-01-06 NOTE — Patient Instructions (Addendum)
Medication Instructions:  Your physician has recommended you make the following change in your medication:  1-STOP zocor 2-STOP Aspirin 325 mg  3-START Aspirin 81 mg by mouth daily 4-START Crestor 40 mg by mouth daily  Labwork: Your physician recommends that you return for lab work in: 2 weeks  BMET, CBC, and PT/INR  Your physician recommends that you return for lab work in: 2 months for fasting lipid and liver panel  Testing/Procedures: Your physician has requested that you have a cardiac catheterization. Cardiac catheterization is used to diagnose and/or treat various heart conditions. Doctors may recommend this procedure for a number of different reasons. The most common reason is to evaluate chest pain. Chest pain can be a symptom of coronary artery disease (CAD), and cardiac catheterization can show whether plaque is narrowing or blocking your heart's arteries. This procedure is also used to evaluate the valves, as well as measure the blood flow and oxygen levels in different parts of your heart. For further information please visit [email or web address] Please follow instruction sheet, as given.  A chest x-ray takes a picture of the organs and structures inside the chest, including the heart, lungs, and blood vessels. This test can show several things, including, whether the heart is enlarges; whether fluid is building up in the lungs; and whether pacemaker / defibrillator leads are still in place. [facility] on [address]   Follow-Up: Your physician recommends that you schedule a follow-up appointment in: 1 month with Dr. Clifton JamesMcAlhany or Robbie LisBrittainy Simmons PA.   You have been referred to Gastroenterologist, they will call you to schedule.    If you need a refill on your cardiac medications before your next appointment, please call your pharmacy.    [facility] [address], [address] [place] [address] Dept: [phone] Loc: [phone]  Burnett HarryGene R Alto  01/06/2018  You are scheduled for a Cardiac Catheterization on Thursday, April 11 with Dr. [name] McAlhany.  1. [facility] at [facility]: [address] [place], [place] [address] at 8:00 AM (two hours before your procedure to ensure your preparation). Free valet parking service is available.   Special note: Every effort is made to have your procedure done on time. Please understand that emergencies sometimes delay scheduled procedures.  2. Diet: Do not eat or drink anything after midnight prior to your procedure except sips of water to take medications.  3. Labs: You will need to have blood drawn on Tuesday, April 9 at [facility] at [facility], [place]  Open: 7:30am - 5pm    Phone: [phone]. You do not need to be fasting.  4. Medication instructions in preparation for your procedure:  On the morning of your procedure, take your Aspirin and any morning medicines NOT listed above.  You may use sips of water.  5. Plan for one night stay--bring personal belongings. 6. Bring a current list of your medications and current insurance cards. 7. You MUST have a responsible person to drive you home. 8. Someone MUST be with you the first 24 hours after you arrive home or your discharge will be delayed. 9. Please wear clothes that are easy to get on and off and wear slip-on shoes.  Thank you for allowing us to care for you!   -- [facility]

## 2018-01-08 ENCOUNTER — Telehealth: Payer: Self-pay

## 2018-01-08 NOTE — Telephone Encounter (Signed)
Patient needs to come in for lab work on 01/20/18 for pre cath lab work. Left message for patient to call back.

## 2018-01-14 NOTE — Telephone Encounter (Signed)
Left second message for patient to call back.

## 2018-01-15 NOTE — Telephone Encounter (Signed)
Patient is aware that he needs to come in for lab work on 01/20/18. Patient stated he is got some kind of cold/virsus at this time and might need to reschedule his heart cath. Informed patient to follow up with his PCP about his current cold/virsus, and to call our office to reschedule if he is not feeling better by 01/20/18. Patient verbalized understanding.

## 2018-01-15 NOTE — Telephone Encounter (Signed)
error 

## 2018-01-20 ENCOUNTER — Other Ambulatory Visit: Payer: Medicare Other

## 2018-01-20 ENCOUNTER — Telehealth: Payer: Self-pay | Admitting: *Deleted

## 2018-01-20 DIAGNOSIS — I2510 Atherosclerotic heart disease of native coronary artery without angina pectoris: Secondary | ICD-10-CM

## 2018-01-20 DIAGNOSIS — I10 Essential (primary) hypertension: Secondary | ICD-10-CM

## 2018-01-20 DIAGNOSIS — E7800 Pure hypercholesterolemia, unspecified: Secondary | ICD-10-CM

## 2018-01-20 LAB — BASIC METABOLIC PANEL
BUN / CREAT RATIO: 17 (ref 10–24)
BUN: 15 mg/dL (ref 8–27)
CO2: 23 mmol/L (ref 20–29)
CREATININE: 0.9 mg/dL (ref 0.76–1.27)
Calcium: 9.5 mg/dL (ref 8.6–10.2)
Chloride: 102 mmol/L (ref 96–106)
GFR calc non Af Amer: 84 mL/min/{1.73_m2} (ref >59)
GFR, EST AFRICAN AMERICAN: 97 mL/min/{1.73_m2} (ref >59)
Glucose: 110 mg/dL — ABNORMAL HIGH (ref 65–99)
Potassium: 5.3 mmol/L — ABNORMAL HIGH (ref 3.5–5.2)
Sodium: 141 mmol/L (ref 134–144)

## 2018-01-20 LAB — CBC WITH DIFFERENTIAL/PLATELET
Basophils Absolute: 0 10*3/uL (ref 0.0–0.2)
Basos: 0 %
EOS (ABSOLUTE): 0.1 10*3/uL (ref 0.0–0.4)
Eos: 2 %
Hematocrit: 39 % (ref 37.5–51.0)
Hemoglobin: 13.5 g/dL (ref 13.0–17.7)
Immature Grans (Abs): 0 10*3/uL (ref 0.0–0.1)
Immature Granulocytes: 0 %
Lymphocytes Absolute: 2.1 10*3/uL (ref 0.7–3.1)
Lymphs: 33 %
MCH: 34.7 pg — ABNORMAL HIGH (ref 26.6–33.0)
MCHC: 34.6 g/dL (ref 31.5–35.7)
MCV: 100 fL — ABNORMAL HIGH (ref 79–97)
Monocytes Absolute: 0.5 10*3/uL (ref 0.1–0.9)
Monocytes: 7 %
Neutrophils Absolute: 3.7 10*3/uL (ref 1.4–7.0)
Neutrophils: 58 %
Platelets: 201 10*3/uL (ref 150–379)
RBC: 3.89 x10E6/uL — ABNORMAL LOW (ref 4.14–5.80)
RDW: 13.5 % (ref 12.3–15.4)
WBC: 6.4 10*3/uL (ref 3.4–10.8)

## 2018-01-20 LAB — PROTIME-INR
INR: 1.1 (ref 0.8–1.2)
Prothrombin Time: 11 s (ref 9.1–12.0)

## 2018-01-20 NOTE — Telephone Encounter (Signed)
Pt contacted [facility] for: Thursday April 11,2019 10:30 AM Verified arrival [facility] and place: [facility]North Tower at: 8 AM Nothing to eat or drink after midnight prior to cath. Verified allergies in Epic. Verified no diabetes medications.   AM meds can be  taken pre-cath with sip of water including: ASA   Confirmed patient has responsible person to drive home post procedure and observe patient for 24 hours: yes

## 2018-01-22 ENCOUNTER — Ambulatory Visit (HOSPITAL_COMMUNITY)
Admission: RE | Disposition: A | Discharge status: Home / Self Care | Payer: Self-pay | Source: Ambulatory Visit | Attending: Cardiovascular Disease

## 2018-01-22 ENCOUNTER — Encounter (HOSPITAL_COMMUNITY): Payer: Self-pay | Admitting: Cardiovascular Disease

## 2018-01-22 ENCOUNTER — Other Ambulatory Visit: Payer: Medicare Other

## 2018-01-22 ENCOUNTER — Ambulatory Visit (HOSPITAL_COMMUNITY)
Admission: RE | Admit: 2018-01-22 | Discharge: 2018-01-22 | Disposition: A | Discharge status: Home / Self Care | Payer: Medicare Other | Source: Ambulatory Visit | Attending: Cardiovascular Disease | Admitting: Cardiovascular Disease

## 2018-01-22 DIAGNOSIS — I25758 Atherosclerosis of native coronary artery of transplanted heart with other forms of angina pectoris: Secondary | ICD-10-CM

## 2018-01-22 DIAGNOSIS — F17210 Nicotine dependence, cigarettes, uncomplicated: Secondary | ICD-10-CM | POA: Insufficient documentation

## 2018-01-22 DIAGNOSIS — I10 Essential (primary) hypertension: Secondary | ICD-10-CM | POA: Diagnosis not present

## 2018-01-22 DIAGNOSIS — Z79899 Other long term (current) drug therapy: Secondary | ICD-10-CM | POA: Insufficient documentation

## 2018-01-22 DIAGNOSIS — T82855A Stenosis of coronary artery stent, initial encounter: Secondary | ICD-10-CM | POA: Diagnosis not present

## 2018-01-22 DIAGNOSIS — J449 Chronic obstructive pulmonary disease, unspecified: Secondary | ICD-10-CM | POA: Diagnosis not present

## 2018-01-22 DIAGNOSIS — Z79891 Long term (current) use of opiate analgesic: Secondary | ICD-10-CM | POA: Diagnosis not present

## 2018-01-22 DIAGNOSIS — Y838 Other surgical procedures as the cause of abnormal reaction of the patient, or of later complication, without mention of misadventure at the time of the procedure: Secondary | ICD-10-CM | POA: Insufficient documentation

## 2018-01-22 DIAGNOSIS — I25118 Atherosclerotic heart disease of native coronary artery with other forms of angina pectoris: Secondary | ICD-10-CM | POA: Diagnosis not present

## 2018-01-22 DIAGNOSIS — E7800 Pure hypercholesterolemia, unspecified: Secondary | ICD-10-CM | POA: Insufficient documentation

## 2018-01-22 HISTORY — PX: LEFT HEART CATH AND CORONARY ANGIOGRAPHY: CATH118249

## 2018-01-22 LAB — POTASSIUM: Potassium: 4.2 mmol/L (ref 3.5–5.1)

## 2018-01-22 SURGERY — LEFT HEART CATH AND CORONARY ANGIOGRAPHY
Anesthesia: LOCAL

## 2018-01-22 MED ORDER — HEPARIN (PORCINE) IN NACL 2-0.9 UNIT/ML-% IJ SOLN
INTRAMUSCULAR | Status: AC | PRN
  Administered 2018-01-22 (×2): 500 mL

## 2018-01-22 MED ORDER — LIDOCAINE HCL (PF) 1 % IJ SOLN
INTRAMUSCULAR | Status: AC
  Filled 2018-01-22: qty 30

## 2018-01-22 MED ORDER — HEPARIN SODIUM (PORCINE) 1000 UNIT/ML IJ SOLN
INTRAMUSCULAR | Status: DC | PRN
  Administered 2018-01-22: 4000 [IU] via INTRAVENOUS

## 2018-01-22 MED ORDER — MIDAZOLAM HCL 2 MG/2ML IJ SOLN
INTRAMUSCULAR | Status: DC | PRN
  Administered 2018-01-22 (×2): 1 mg via INTRAVENOUS

## 2018-01-22 MED ORDER — ACETAMINOPHEN 325 MG PO TABS: 650.0000 mg | ORAL_TABLET | ORAL | Status: DC | PRN

## 2018-01-22 MED ORDER — IOHEXOL 350 MG/ML SOLN
INTRAVENOUS | Status: DC | PRN
  Administered 2018-01-22: 90 mL

## 2018-01-22 MED ORDER — SODIUM CHLORIDE 0.9 % IV SOLN: 250.0000 mL | INTRAVENOUS | Status: DC | PRN

## 2018-01-22 MED ORDER — HEPARIN (PORCINE) IN NACL 2-0.9 UNIT/ML-% IJ SOLN
INTRAMUSCULAR | Status: AC
  Filled 2018-01-22: qty 500

## 2018-01-22 MED ORDER — FENTANYL CITRATE (PF) 100 MCG/2ML IJ SOLN
INTRAMUSCULAR | Status: AC
  Filled 2018-01-22: qty 2

## 2018-01-22 MED ORDER — VERAPAMIL HCL 2.5 MG/ML IV SOLN
INTRAVENOUS | Status: AC
  Filled 2018-01-22: qty 2

## 2018-01-22 MED ORDER — ONDANSETRON HCL 4 MG/2ML IJ SOLN: 4.0000 mg | Freq: Four times a day (QID) | INTRAMUSCULAR | Status: DC | PRN

## 2018-01-22 MED ORDER — SODIUM CHLORIDE 0.9% FLUSH: 3.0000 mL | INTRAVENOUS | Status: DC | PRN

## 2018-01-22 MED ORDER — HEPARIN SODIUM (PORCINE) 1000 UNIT/ML IJ SOLN
INTRAMUSCULAR | Status: AC
  Filled 2018-01-22: qty 1

## 2018-01-22 MED ORDER — LIDOCAINE HCL (PF) 1 % IJ SOLN
INTRAMUSCULAR | Status: DC | PRN
  Administered 2018-01-22: 2 mL

## 2018-01-22 MED ORDER — SODIUM CHLORIDE 0.9 % WEIGHT BASED INFUSION: 1.0000 mL/kg/h | INTRAVENOUS | Status: DC

## 2018-01-22 MED ORDER — SODIUM CHLORIDE 0.9 % WEIGHT BASED INFUSION
3.0000 mL/kg/h | INTRAVENOUS | Status: AC
  Administered 2018-01-22: 3 mL/kg/h via INTRAVENOUS

## 2018-01-22 MED ORDER — SODIUM CHLORIDE 0.9% FLUSH: 3.0000 mL | Freq: Two times a day (BID) | INTRAVENOUS | Status: DC

## 2018-01-22 MED ORDER — SODIUM CHLORIDE 0.9 % IV SOLN: INTRAVENOUS | Status: AC

## 2018-01-22 MED ORDER — ASPIRIN 81 MG PO CHEW: 81.0000 mg | CHEWABLE_TABLET | ORAL | Status: DC

## 2018-01-22 MED ORDER — MIDAZOLAM HCL 2 MG/2ML IJ SOLN
INTRAMUSCULAR | Status: AC
  Filled 2018-01-22: qty 2

## 2018-01-22 MED ORDER — FENTANYL CITRATE (PF) 100 MCG/2ML IJ SOLN
INTRAMUSCULAR | Status: DC | PRN
  Administered 2018-01-22 (×2): 25 ug via INTRAVENOUS

## 2018-01-22 MED ORDER — VERAPAMIL HCL 2.5 MG/ML IV SOLN
INTRAVENOUS | Status: DC | PRN
  Administered 2018-01-22: 10 mL via INTRA_ARTERIAL

## 2018-01-22 SURGICAL SUPPLY — 15 items
BAND CMPR LRG ZPHR (HEMOSTASIS) ×1
BAND ZEPHYR COMPRESS 30 LONG (HEMOSTASIS) ×1 IMPLANT
CATH IMPULSE 5F ANG/FL3.5 (CATHETERS) ×1 IMPLANT
COVER PRB 48X5XTLSCP FOLD TPE (BAG) ×1 IMPLANT
COVER PROBE 5X48 (BAG) ×2
GUIDEWIRE INQWIRE 1.5J.035X260 (WIRE) ×1 IMPLANT
INQWIRE 1.5J .035X260CM (WIRE) ×2
KIT HEART LEFT (KITS) ×2 IMPLANT
NDL PERC 21GX4CM (NEEDLE) IMPLANT
NEEDLE PERC 21GX4CM (NEEDLE) ×2 IMPLANT
PACK CARDIAC CATHETERIZATION (CUSTOM PROCEDURE TRAY) ×2 IMPLANT
SHEATH RAIN RADIAL 21G 6FR (SHEATH) ×1 IMPLANT
SYR MEDRAD MARK V 150ML (SYRINGE) ×2 IMPLANT
TRANSDUCER W/STOPCOCK (MISCELLANEOUS) ×2 IMPLANT
TUBING CIL FLEX 10 FLL-RA (TUBING) ×2 IMPLANT

## 2018-01-22 NOTE — Interval H&P Note (Signed)
History and Physical Interval Note:  01/22/2018 8:51 AM  [name]  has presented today for cardiac cath with the diagnosis of CAD/unstable angina.  The various methods of treatment have been discussed with the patient and family. After consideration of risks, benefits and other options for treatment, the patient has consented to  Procedure(s): LEFT HEART CATH AND CORONARY ANGIOGRAPHY (N/A) as a surgical intervention .  The patient's history has been reviewed, patient examined, no change in status, stable for surgery.  I have reviewed the patient's chart and labs.  Questions were answered to the patient's satisfaction.    Cath Lab Visit (complete for each Cath Lab visit)  Clinical Evaluation Leading to the Procedure:   ACS: No.  Non-ACS:    Anginal Classification: CCS III  Anti-ischemic medical therapy: No Therapy  Non-Invasive Test Results: No non-invasive testing performed  Prior CABG: No previous CABG        [name] McAlhany

## 2018-01-22 NOTE — Discharge Instructions (Signed)

## 2018-01-23 MED FILL — Heparin Sodium (Porcine) 2 Unit/ML in Sodium Chloride 0.9%: INTRAMUSCULAR | Qty: 1000 | Status: AC

## 2018-01-23 NOTE — Research (Signed)
CADFEM Informed Consent   Subject Name: [name]  Subject met inclusion and exclusion criteria.  The informed consent form, study requirements and expectations were reviewed with the subject and questions and concerns were addressed prior to the signing of the consent form.  The subject verbalized understanding of the trail requirements.  The subject agreed to participate in the CADFEM trial and signed the informed consent.  The informed consent was obtained prior to performance of any protocol-specific procedures for the subject.  A copy of the signed informed consent was given to the subject and a copy was placed in the subject's medical record.  [name] 01/22/18 9:14 AM

## 2018-02-12 ENCOUNTER — Ambulatory Visit (INDEPENDENT_AMBULATORY_CARE_PROVIDER_SITE_OTHER): Payer: Medicare Other | Admitting: Cardiology

## 2018-02-12 ENCOUNTER — Telehealth: Payer: Self-pay | Admitting: *Deleted

## 2018-02-12 ENCOUNTER — Encounter: Payer: Self-pay | Admitting: Cardiology

## 2018-02-12 VITALS — BP 140/92 | HR 72 | Ht 68.0 in | Wt 158.0 lb

## 2018-02-12 DIAGNOSIS — R0600 Dyspnea, unspecified: Secondary | ICD-10-CM

## 2018-02-12 DIAGNOSIS — I2510 Atherosclerotic heart disease of native coronary artery without angina pectoris: Secondary | ICD-10-CM

## 2018-02-12 NOTE — Progress Notes (Signed)
02/12/2018 Lynette R Tango   1946-04-25  [id number]  Primary Physician Barbette Reichmann, MD Primary Cardiologist: Dr. Clifton James   Reason for Visit/CC:  post cath follow up  HPI:  [name] is a 72 y.o. male who is being seen today for cardiac cath follow-up.  He has known CAD status post previous stenting to the RCA in 1999.  He had repeat cardiac catheterization in 2010 and again in 2016 showing patent RCA stent.  He also has a long history of tobacco use, COPD and hypertension.  He was recently seen in clinic and complained of exertional dyspnea and neck pain that was concerning for unstable angina.  He also endorsed a 9 pound intentional weight loss and night sweats.  He was ordered to undergo repeat cardiac catheterization to rule out obstructive CAD.  Given his smoking history along with unintentional weight loss and night sweats, there was also concerns for possible malignancy.  He was ordered to get a chest x-ray. It was also recommended that he be referred to GI for possible endoscopy.  Cardiac catheterization was performed by Dr. Clifton James on January 22, 2018 and showed stable coronary disease without obstruction.  The previously placed RCA stent was patent.  Continued medical therapy was recommended.  Zocor was changed to Crestor for better LDL reduction.  Dr. Clifton James also recommended that he continue follow-up to rule out underlying malignancy.  Since his heart catheterization, he was evaluated at [facility] for cough and dyspnea.  He had a chest x-ray done there which showed no active cardiopulmonary disease.  He reports that he was treated for acute bronchitis.  He was placed on doxycycline plus prednisone which helped to clear up his cough initially.  However he is starting to develop some recurrent productive cough with clear-colored sputum. His has f/u with his pulmonologist this month.  He denies chest pain.  He reports full medication compliance with his cardiac meds.   He is tolerating Crestor.  No side effects.  Cardiac Studies   Procedures   LEFT HEART CATH AND CORONARY ANGIOGRAPHY 01/22/18  Conclusion     Prox RCA to Mid RCA lesion is 40% stenosed.  Dist RCA lesion is 20% stenosed.  Post Atrio lesion is 40% stenosed.  Ost RPDA lesion is 40% stenosed.  Ost Cx to Prox Cx lesion is 30% stenosed.  Mid LM to Dist LM lesion is 40% stenosed.  Ost 1st Diag lesion is 60% stenosed.  The left ventricular systolic function is normal.  LV end diastolic pressure is normal.  The left ventricular ejection fraction is 55-65% by visual estimate.  There is no mitral valve regurgitation.   1. Patent stent mid RCA with minimal restenosis, unchanged 2. Mild distal left main stenosis, unchanged 3. Moderate stenosis in the small to moderate caliber Diagonal branch, unchanged 4. Mild plaque in the Circumflex and LAD 5. Normal LV systolic function  Recommendations: His CAD is stable. I do not think his dyspnea is related to his CAD. He will continue workup for possible malignancy. He will need to see his pulmonary specialist.      Current Meds  Medication Sig  . acetaminophen (TYLENOL) 500 MG tablet Take 1,000 mg by mouth every 12 (twelve) hours as needed for mild pain, fever or headache (sleep).   Marland Kitchen aspirin 325 MG tablet Take 162.5 mg by mouth daily.  Marland Kitchen buPROPion (WELLBUTRIN XL) 150 MG 24 hr tablet Take 150 mg by mouth at bedtime.  . cyanocobalamin 500  MCG tablet Take 500 mcg by mouth 2 (two) times daily. VITAMIN B-12  . diphenhydramine-acetaminophen (TYLENOL PM) 25-500 MG TABS tablet Take 1 tablet by mouth at bedtime as needed (for sleep).  Marland Kitchen escitalopram (LEXAPRO) 20 MG tablet Take 1 tablet (20 mg total) by mouth daily.  Marland Kitchen esomeprazole (NEXIUM) 40 MG capsule Take 1 capsule (40 mg total) by mouth daily.  Marland Kitchen ibuprofen (ADVIL,MOTRIN) 800 MG tablet Take 800 mg by mouth every 8 (eight) hours as needed (FOR PAIN.).  Marland Kitchen montelukast (SINGULAIR) 10 MG tablet  Take 10 mg by mouth at bedtime.   Marland Kitchen morphine (MS CONTIN) 15 MG 12 hr tablet Take 15 mg by mouth 2 (two) times daily.   Marland Kitchen oxyCODONE-acetaminophen (PERCOCET) 5-325 MG per tablet Take 1 tablet by mouth every 4 (four) hours as needed. For pain  . rosuvastatin (CRESTOR) 40 MG tablet Take 1 tablet (40 mg total) by mouth daily.  Marland Kitchen tiotropium (SPIRIVA) 18 MCG inhalation capsule Place 18 mcg into inhaler and inhale at bedtime.   . valsartan (DIOVAN) 160 MG tablet Take 1 tablet (160 mg total) by mouth daily. Please keep upcoming appt before anymore refills. Thank you  . Vitamin D, Ergocalciferol, (DRISDOL) 50000 UNITS CAPS capsule Take 50,000 Units by mouth every Wednesday.    Allergies  Allergen Reactions  . Levofloxacin Itching and Rash   Past Medical History:  Diagnosis Date  . Anemia   . Back pain    scoliosis  . CAD (coronary artery disease)    Stent RCA 1999. Last cath April 2010 with patent RCA stent, 30% LM, mild plaque LAD and RCA  . COPD (chronic obstructive pulmonary disease) (HCC)   . Hypertension    Family History  Problem Relation Age of Onset  . Alzheimer's disease Father   . Angina Father   . Diabetes Cousin        mothers side  . Heart attack Cousin        fathers side  . Hyperlipidemia Cousin        fathers side  . Hypertension Cousin        fathers side   Past Surgical History:  Procedure Laterality Date  . APPENDECTOMY  1957  . BACK SURGERY    . CARDIAC CATHETERIZATION  1999   stent placement RCA  . CARDIAC CATHETERIZATION  2010   mild left CA distal stenosis, mild irregularities in RCA and LAD. patent setn in RCA  . FOOT SURGERY  1973   left  . LEFT HEART CATH AND CORONARY ANGIOGRAPHY N/A 01/22/2018   Procedure: LEFT HEART CATH AND CORONARY ANGIOGRAPHY;  Surgeon: Kathleene Hazel, MD;  Location: [facility]AS[facility];  Service: Cardiovascular;  Laterality: N/A;  . LEFT HEART CATHETERIZATION WITH CORONARY ANGIOGRAM N/A 01/11/2015   Procedure: LEFT HEART  CATHETERIZATION WITH CORONARY ANGIOGRAM;  Surgeon: Kathleene Hazel, MD;  Location: [facility] CATH LAB;  Service: Cardiovascular;  Laterality: N/A;   Social History   Socioeconomic History  . Marital status: Divorced    Spouse name: Not on file  . Number of children: 3  . Years of education: Not on file  . Highest education level: Not on file  Occupational History    Employer: RETIRED  Social Needs  . Financial resource strain: Not on file  . Food insecurity:    Worry: Not on file    Inability: Not on file  . Transportation needs:    Medical: Not on file    Non-medical: Not on file  Tobacco Use  . Smoking status: Current Every Day Smoker    Packs/day: 1.00    Years: 35.00    Pack years: 35.00    Types: Cigarettes  . Smokeless tobacco: Never Used  Substance and Sexual Activity  . Alcohol use: Yes    Alcohol/week: 3.5 oz    Types: 7 drink(s) per week  . Drug use: No  . Sexual activity: Not on file  Lifestyle  . Physical activity:    Days per week: Not on file    Minutes per session: Not on file  . Stress: Not on file  Relationships  . Social connections:    Talks on phone: Not on file    Gets together: Not on file    Attends religious service: Not on file    Active member of club or organization: Not on file    Attends meetings of clubs or organizations: Not on file    Relationship status: Not on file  . Intimate partner violence:    Fear of current or ex partner: Not on file    Emotionally abused: Not on file    Physically abused: Not on file    Forced sexual activity: Not on file  Other Topics Concern  . Not on file  Social History Narrative  . Not on file     Review of Systems: General: negative for chills, fever, night sweats or weight changes.  Cardiovascular: negative for chest pain, dyspnea on exertion, edema, orthopnea, palpitations, paroxysmal nocturnal dyspnea or shortness of breath Dermatological: negative for rash Respiratory: negative for cough  or wheezing Urologic: negative for hematuria Abdominal: negative for nausea, vomiting, diarrhea, bright red blood per rectum, melena, or hematemesis Neurologic: negative for visual changes, syncope, or dizziness All other systems reviewed and are otherwise negative except as noted above.   Physical Exam:  Blood pressure (!) 140/92, pulse 72, height  (1.727 m), weight 158 lb (71.7 kg), SpO2 96 %.  General appearance: alert, cooperative and no distress Neck: no carotid bruit and no JVD Lungs: clear to auscultation bilaterally Heart: regular rate and rhythm, S1, S2 normal, no murmur, click, rub or gallop Extremities: extremities normal, atraumatic, no cyanosis or edema Pulses: 2+ and symmetric Skin: Skin color, texture, turgor normal. No rashes or lesions Neurologic: Grossly normal  EKG not performed -- personally reviewed   ASSESSMENT AND PLAN:   1. CAD: s/p remote RCA stent with recent LHC 01/22/18 showing patent stent and stable nonobstructive CAD. Medical therapy recommended. Continue ASA and statin. No BB due to h/o bradycardia. He denies any CP.  2. Throat Pain/Weight Loss/ Night Sweats: long time smoker. Recent CXR at [facility] normal. He has f/u with his pulmonologist later this month. ? Bronchoscopy. He also has a pending referral to see GI.    3. Tobacco Abuse: Smoking cessation advised however the patient is not interested in quitting at this time, despite the risk.    4. HLD: recent LDL not at goal at 76. Zocor was changed to Crestor 40 mg. He will need repeat FLP in 6 weeks.   5. HTN: controlled on current regimen.   Follow-Up w/ Dr. Clifton James in 6 months   Stefhanie Kachmar Delmer Islam, MHS [facility] 02/12/2018 2:00 PM

## 2018-02-12 NOTE — Patient Instructions (Signed)
Medication Instructions:  Your physician recommends that you continue on your current medications as directed. Please refer to the Current Medication list given to you today.   Labwork: KEEP YOUR APPT FOR 02/25/18  Testing/Procedures: None ordered  Follow-Up: Your physician wants you to follow-up in: 6 MONTHS WITH DR. Clifton James   You will receive a reminder letter in the mail two months in advance. If you don't receive a letter, please call our office to schedule the follow-up appointment.   Any Other Special Instructions Will Be Listed Below (If Applicable).     If you need a refill on your cardiac medications before your next appointment, please call your pharmacy.

## 2018-02-12 NOTE — Telephone Encounter (Signed)
Tried to call pt re: Aspirin and it needed to be reduced to 81 mg qd. Left a message for pt to call me back.

## 2018-02-13 NOTE — Telephone Encounter (Signed)
Tried to reach pt again, re: Aspirin and he needed to reduce it to 81 mg qd. Left another message for him to call back.

## 2018-02-16 ENCOUNTER — Ambulatory Visit: Payer: Self-pay | Admitting: Internal Medicine

## 2018-02-17 ENCOUNTER — Encounter: Payer: Self-pay | Admitting: Internal Medicine

## 2018-02-17 ENCOUNTER — Ambulatory Visit (INDEPENDENT_AMBULATORY_CARE_PROVIDER_SITE_OTHER): Payer: Medicare Other | Admitting: Internal Medicine

## 2018-02-17 VITALS — BP 154/80 | HR 82 | Resp 16 | Ht 68.0 in | Wt 159.2 lb

## 2018-02-17 DIAGNOSIS — J209 Acute bronchitis, unspecified: Secondary | ICD-10-CM | POA: Diagnosis not present

## 2018-02-17 DIAGNOSIS — J440 Chronic obstructive pulmonary disease with acute lower respiratory infection: Secondary | ICD-10-CM | POA: Diagnosis not present

## 2018-02-17 DIAGNOSIS — I25758 Atherosclerosis of native coronary artery of transplanted heart with other forms of angina pectoris: Secondary | ICD-10-CM | POA: Diagnosis not present

## 2018-02-17 DIAGNOSIS — F17210 Nicotine dependence, cigarettes, uncomplicated: Secondary | ICD-10-CM | POA: Diagnosis not present

## 2018-02-17 DIAGNOSIS — I2510 Atherosclerotic heart disease of native coronary artery without angina pectoris: Secondary | ICD-10-CM

## 2018-02-17 DIAGNOSIS — J449 Chronic obstructive pulmonary disease, unspecified: Secondary | ICD-10-CM | POA: Diagnosis not present

## 2018-02-17 MED ORDER — AZITHROMYCIN 250 MG PO TABS: ORAL_TABLET | ORAL | 0 refills | Status: DC

## 2018-02-17 MED ORDER — PREDNISONE 10 MG (21) PO TBPK: ORAL_TABLET | ORAL | 0 refills | Status: DC

## 2018-02-17 NOTE — Patient Instructions (Signed)

## 2018-02-17 NOTE — Progress Notes (Signed)
[facility] [address] [place], [place] [address]  Pulmonary Sleep Medicine   Office Visit Note  Patient Name: [name] DOB: [birth year] MRN [id number]  Date of Service: 02/17/2018  Complaints/HPI:  States he is doing okay was seen by his cardiologist had a cardiac catheterization done which apparently showed stable coronary artery disease.  He continues to unfortunately smoke.  He has had no active chest pain.  Still having some shortness of breath.  His coughing has some congestion noted.  He states he recently was treated for acute bronchitis but he feels as though the symptoms have not completely resolved.  ROS  General: (-) fever, (-) chills, (-) night sweats, (-) weakness Skin: (-) rashes, (-) itching,. Eyes: (-) visual changes, (-) redness, (-) itching. Nose and Sinuses: (-) nasal stuffiness or itchiness, (-) postnasal drip, (-) nosebleeds, (-) sinus trouble. Mouth and Throat: (-) sore throat, (-) hoarseness. Neck: (-) swollen glands, (-) enlarged thyroid, (-) neck pain. Respiratory: + cough, (-) bloody sputum, + shortness of breath, + wheezing. Cardiovascular: - ankle swelling, (-) chest pain. Lymphatic: (-) lymph node enlargement. Neurologic: (-) numbness, (-) tingling. Psychiatric: (-) anxiety, (-) depression   Current Medication: Outpatient Encounter Medications as of 02/17/2018  Medication Sig Note  . acetaminophen (TYLENOL) 500 MG tablet Take 1,000 mg by mouth every 12 (twelve) hours as needed for mild pain, fever or headache (sleep).  01/19/2018: MAX OF 4 TABLETS DAILY  . aspirin 325 MG tablet Take 162.5 mg by mouth daily.   Marland Kitchen buPROPion (WELLBUTRIN XL) 150 MG 24 hr tablet Take 150 mg by mouth at bedtime.   . cyanocobalamin 500 MCG tablet Take 500 mcg by mouth 2 (two) times daily. VITAMIN B-12   . diphenhydramine-acetaminophen (TYLENOL PM) 25-500 MG TABS tablet Take 1 tablet by mouth at bedtime as needed (for sleep).   Marland Kitchen escitalopram (LEXAPRO) 20 MG  tablet Take 1 tablet (20 mg total) by mouth daily.   Marland Kitchen esomeprazole (NEXIUM) 40 MG capsule Take 1 capsule (40 mg total) by mouth daily.   Marland Kitchen ibuprofen (ADVIL,MOTRIN) 800 MG tablet Take 800 mg by mouth every 8 (eight) hours as needed (FOR PAIN.).   Marland Kitchen montelukast (SINGULAIR) 10 MG tablet Take 10 mg by mouth at bedtime.    Marland Kitchen morphine (MS CONTIN) 15 MG 12 hr tablet Take 15 mg by mouth 2 (two) times daily.    Marland Kitchen oxyCODONE-acetaminophen (PERCOCET) 5-325 MG per tablet Take 1 tablet by mouth every 4 (four) hours as needed. For pain   . rosuvastatin (CRESTOR) 40 MG tablet Take 1 tablet (40 mg total) by mouth daily.   Marland Kitchen tiotropium (SPIRIVA) 18 MCG inhalation capsule Place 18 mcg into inhaler and inhale at bedtime.    . valsartan (DIOVAN) 160 MG tablet Take 1 tablet (160 mg total) by mouth daily. Please keep upcoming appt before anymore refills. Thank you   . Vitamin D, Ergocalciferol, (DRISDOL) 50000 UNITS CAPS capsule Take 50,000 Units by mouth every Wednesday.     No facility-administered encounter medications on file as of 02/17/2018.     Surgical History: Past Surgical History:  Procedure Laterality Date  . APPENDECTOMY  1957  . BACK SURGERY    . CARDIAC CATHETERIZATION  1999   stent placement RCA  . CARDIAC CATHETERIZATION  2010   mild left CA distal stenosis, mild irregularities in RCA and LAD. patent setn in RCA  . FOOT SURGERY  1973   left  . LEFT HEART CATH AND CORONARY ANGIOGRAPHY  N/A 01/22/2018   Procedure: LEFT HEART CATH AND CORONARY ANGIOGRAPHY;  Surgeon: Kathleene Hazel, MD;  Location: [facility] INVASIVE CV LAB;  Service: Cardiovascular;  Laterality: N/A;  . LEFT HEART CATHETERIZATION WITH CORONARY ANGIOGRAM N/A 01/11/2015   Procedure: LEFT HEART CATHETERIZATION WITH CORONARY ANGIOGRAM;  Surgeon: Kathleene Hazel, MD;  Location: [facility] CATH LAB;  Service: Cardiovascular;  Laterality: N/A;    Medical History: Past Medical History:  Diagnosis Date  . Anemia   . Back pain     scoliosis  . CAD (coronary artery disease)    Stent RCA 1999. Last cath April 2010 with patent RCA stent, 30% LM, mild plaque LAD and RCA  . COPD (chronic obstructive pulmonary disease) (HCC)   . Hypertension     Family History: Family History  Problem Relation Age of Onset  . Alzheimer's disease Father   . Angina Father   . Diabetes Cousin        mothers side  . Heart attack Cousin        fathers side  . Hyperlipidemia Cousin        fathers side  . Hypertension Cousin        fathers side    Social History: Social History   Socioeconomic History  . Marital status: Divorced    Spouse name: Not on file  . Number of children: 3  . Years of education: Not on file  . Highest education level: Not on file  Occupational History    Employer: RETIRED  Social Needs  . Financial resource strain: Not on file  . Food insecurity:    Worry: Not on file    Inability: Not on file  . Transportation needs:    Medical: Not on file    Non-medical: Not on file  Tobacco Use  . Smoking status: Current Every Day Smoker    Packs/day: 1.00    Years: 35.00    Pack years: 35.00    Types: Cigarettes  . Smokeless tobacco: Never Used  Substance and Sexual Activity  . Alcohol use: Yes    Alcohol/week: 3.5 oz    Types: 7 drink(s) per week  . Drug use: No  . Sexual activity: Not on file  Lifestyle  . Physical activity:    Days per week: Not on file    Minutes per session: Not on file  . Stress: Not on file  Relationships  . Social connections:    Talks on phone: Not on file    Gets together: Not on file    Attends religious service: Not on file    Active member of club or organization: Not on file    Attends meetings of clubs or organizations: Not on file    Relationship status: Not on file  . Intimate partner violence:    Fear of current or ex partner: Not on file    Emotionally abused: Not on file    Physically abused: Not on file    Forced sexual activity: Not on file  Other  Topics Concern  . Not on file  Social History Narrative  . Not on file    Vital Signs: Blood pressure (!) 154/80, pulse 82, resp. rate 16, height  (1.727 m), weight 159 lb 3.2 oz (72.2 kg), SpO2 94 %.  Examination: General Appearance: The patient is well-developed, well-nourished, and in no distress. Skin: Gross inspection of skin unremarkable. Head: normocephalic, no gross deformities. Eyes: no gross deformities noted. ENT: ears appear grossly normal no  exudates. Neck: Supple. No thyromegaly. No LAD. Respiratory: scattered rhonchi noted. Cardiovascular: Normal S1 and S2 without murmur or rub. Extremities: No cyanosis. pulses are equal. Neurologic: Alert and oriented. No involuntary movements.  LABS: Recent Results (from the past 2160 hour(s))  CBC with Differential/Platelet     Status: Abnormal   Collection Time: 01/20/18 12:17 PM  Result Value Ref Range   WBC 6.4 3.4 - 10.8 x10E3/uL   RBC 3.89 (L) 4.14 - 5.80 x10E6/uL   Hemoglobin 13.5 13.0 - 17.7 g/dL   Hematocrit 29.5 62.1 - 51.0 %   MCV 100 (H) 79 - 97 fL   MCH 34.7 (H) 26.6 - 33.0 pg   MCHC 34.6 31.5 - 35.7 g/dL   RDW 30.8 65.7 - 84.6 %   Platelets 201 150 - 379 x10E3/uL   Neutrophils 58 Not Estab. %   Lymphs 33 Not Estab. %   Monocytes 7 Not Estab. %   Eos 2 Not Estab. %   Basos 0 Not Estab. %   Neutrophils Absolute 3.7 1.4 - 7.0 x10E3/uL   Lymphocytes Absolute 2.1 0.7 - 3.1 x10E3/uL   Monocytes Absolute 0.5 0.1 - 0.9 x10E3/uL   EOS (ABSOLUTE) 0.1 0.0 - 0.4 x10E3/uL   Basophils Absolute 0.0 0.0 - 0.2 x10E3/uL   Immature Granulocytes 0 Not Estab. %   Immature Grans (Abs) 0.0 0.0 - 0.1 x10E3/uL  Basic metabolic panel     Status: Abnormal   Collection Time: 01/20/18 12:17 PM  Result Value Ref Range   Glucose 110 (H) 65 - 99 mg/dL   BUN 15 8 - 27 mg/dL   Creatinine, Ser 9.62 0.76 - 1.27 mg/dL   GFR calc non Af Amer 84 >59 mL/min/1.73   GFR calc Af Amer 97 >59 mL/min/1.73   BUN/Creatinine Ratio 17 10 - 24    Sodium 141 134 - 144 mmol/L   Potassium 5.3 (H) 3.5 - 5.2 mmol/L   Chloride 102 96 - 106 mmol/L   CO2 23 20 - 29 mmol/L   Calcium 9.5 8.6 - 10.2 mg/dL  Protime-INR     Status: None   Collection Time: 01/20/18 12:17 PM  Result Value Ref Range   INR 1.1 0.8 - 1.2    Comment: Reference interval is for non-anticoagulated patients. Suggested INR therapeutic range for Vitamin K antagonist therapy:    Standard Dose (moderate intensity                   therapeutic range):       2.0 - 3.0    Higher intensity therapeutic range       2.5 - 3.5    Prothrombin Time 11.0 9.1 - 12.0 sec  Potassium     Status: None   Collection Time: 01/22/18  8:10 AM  Result Value Ref Range   Potassium 4.2 3.5 - 5.1 mmol/L    Comment: Performed at [facility], [address] [place], [place] [address]    Radiology: No results found.  No results found.  No results found.    Assessment and Plan: Patient Active Problem List   Diagnosis Date Noted  . 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 Benign essential hypertension 02/13/2017  . Centrilobular emphysema (HCC) 02/13/2017  . Chronic bilateral low back pain with right-sided sciatica 02/13/2017  . Gastroesophageal reflux disease 02/13/2017  . Major depressive disorder, recurrent, in full remission (HCC) 02/13/2017  . CAD in native  artery 03/24/2011  . Tobacco user 03/24/2011  . Hypercholesterolemia 03/24/2011    1. COPD on inhalers will continue with his current inhalers 2. Acute bronchitis will give him a course of abx and also another round of steroids. Again needs to stop smoking 3. Nicotine abuse once again spoke to him about stopping smoking. He states he knows it is causing damage to him but has not wanted to stop smoking 4. CAD seen by cardiology and has been stable on recent cardiac cath  General Counseling: I have discussed the findings of the evaluation and examination  with [name].  I have also discussed any further diagnostic evaluation thatmay be needed or ordered today. Nox verbalizes understanding of the findings of todays visit. We also reviewed his medications today and discussed drug interactions and side effects including but not limited excessive drowsiness and altered mental states. We also discussed that there is always a risk not just to him but also people around him. he has been encouraged to call the office with any questions or concerns that should arise related to todays visit.    Time spent:  I have personally obtained a history, examined the patient, evaluated laboratory and imaging results, formulated the assessment and plan and placed orders.    Yevonne Pax, MD [facility]monary and Critical [facility] Sleep medicine

## 2018-02-20 ENCOUNTER — Other Ambulatory Visit: Payer: Self-pay

## 2018-02-20 DIAGNOSIS — J209 Acute bronchitis, unspecified: Secondary | ICD-10-CM

## 2018-02-20 DIAGNOSIS — J440 Chronic obstructive pulmonary disease with acute lower respiratory infection: Principal | ICD-10-CM

## 2018-02-20 MED ORDER — AZITHROMYCIN 250 MG PO TABS: ORAL_TABLET | ORAL | 0 refills | Status: DC

## 2018-02-20 MED ORDER — PREDNISONE 10 MG (21) PO TBPK: ORAL_TABLET | ORAL | 0 refills | Status: DC

## 2018-02-24 MED ORDER — ASPIRIN EC 81 MG PO TBEC: 81.0000 mg | DELAYED_RELEASE_TABLET | Freq: Every day | ORAL | 3 refills | Status: DC

## 2018-02-24 NOTE — Telephone Encounter (Signed)
Pt returned my call and he has been advised to decrease his Aspirin to 81 mg daily. Pt verbalized understanding.

## 2018-02-24 NOTE — Telephone Encounter (Signed)
Reached out to pt re:  Aspirin. Left another message for pt to call back.

## 2018-02-25 ENCOUNTER — Other Ambulatory Visit: Payer: Medicare Other | Admitting: *Deleted

## 2018-02-25 DIAGNOSIS — I10 Essential (primary) hypertension: Secondary | ICD-10-CM

## 2018-02-25 DIAGNOSIS — I2510 Atherosclerotic heart disease of native coronary artery without angina pectoris: Secondary | ICD-10-CM

## 2018-02-25 DIAGNOSIS — E7800 Pure hypercholesterolemia, unspecified: Secondary | ICD-10-CM

## 2018-02-25 LAB — LIPID PANEL
CHOLESTEROL TOTAL: 123 mg/dL (ref 100–199)
Chol/HDL Ratio: 1.8 ratio (ref 0.0–5.0)
HDL: 70 mg/dL (ref >39)
LDL Calculated: 40 mg/dL (ref 0–99)
TRIGLYCERIDES: 64 mg/dL (ref 0–149)
VLDL Cholesterol Cal: 13 mg/dL (ref 5–40)

## 2018-02-25 LAB — HEPATIC FUNCTION PANEL
ALK PHOS: 107 IU/L (ref 39–117)
ALT: 47 IU/L — AB (ref 0–44)
AST: 33 IU/L (ref 0–40)
Albumin: 4.2 g/dL (ref 3.5–4.8)
BILIRUBIN TOTAL: 0.4 mg/dL (ref 0.0–1.2)
BILIRUBIN, DIRECT: 0.22 mg/dL (ref 0.00–0.40)
Total Protein: 6.8 g/dL (ref 6.0–8.5)

## 2018-02-27 ENCOUNTER — Telehealth: Payer: Self-pay

## 2018-02-27 DIAGNOSIS — I2510 Atherosclerotic heart disease of native coronary artery without angina pectoris: Secondary | ICD-10-CM

## 2018-02-27 DIAGNOSIS — I10 Essential (primary) hypertension: Secondary | ICD-10-CM

## 2018-02-27 NOTE — Telephone Encounter (Signed)
-----   Message from Allayne Butcher, [place] sent at 02/26/2018  5:01 PM EDT ----- Please see result note and notify pt. Order repeat ALT in 2 weeks.

## 2018-02-27 NOTE — Telephone Encounter (Signed)
lmtcb for lab results and recommendations 

## 2018-03-02 ENCOUNTER — Ambulatory Visit: Payer: Self-pay | Admitting: Internal Medicine

## 2018-03-02 NOTE — Telephone Encounter (Signed)
Pt is aware and agreeable to lab results. He is agreeable to come on 5/30 for repeat ALT. Order and appt are in

## 2018-03-02 NOTE — Addendum Note (Signed)
Addended by: Dareen Piano on: 03/02/2018 08:33 AM   Modules accepted: Orders

## 2018-03-12 ENCOUNTER — Other Ambulatory Visit: Payer: Medicare Other

## 2018-03-20 ENCOUNTER — Other Ambulatory Visit: Payer: Medicare Other | Admitting: *Deleted

## 2018-03-20 DIAGNOSIS — I10 Essential (primary) hypertension: Secondary | ICD-10-CM

## 2018-03-20 DIAGNOSIS — I2510 Atherosclerotic heart disease of native coronary artery without angina pectoris: Secondary | ICD-10-CM

## 2018-03-21 LAB — ALT: ALT: 16 IU/L (ref 0–44)

## 2018-03-24 ENCOUNTER — Other Ambulatory Visit: Payer: Self-pay | Admitting: Cardiovascular Disease

## 2018-03-24 ENCOUNTER — Other Ambulatory Visit: Payer: Self-pay

## 2018-03-24 MED ORDER — VALSARTAN 160 MG PO TABS: 160.0000 mg | ORAL_TABLET | Freq: Every day | ORAL | 0 refills | Status: DC

## 2018-03-24 MED ORDER — TIOTROPIUM BROMIDE MONOHYDRATE 18 MCG IN CAPS: 18.0000 ug | ORAL_CAPSULE | Freq: Every day | RESPIRATORY_TRACT | 1 refills | Status: DC

## 2018-03-24 MED ORDER — MONTELUKAST SODIUM 10 MG PO TABS: 10.0000 mg | ORAL_TABLET | Freq: Every day | ORAL | 1 refills | Status: DC

## 2018-03-24 NOTE — Telephone Encounter (Signed)
Pt's medication was sent to pt's pharmacy as requested. Confirmation received.  °

## 2018-03-25 ENCOUNTER — Other Ambulatory Visit: Payer: Self-pay | Admitting: Cardiovascular Disease

## 2018-03-25 ENCOUNTER — Other Ambulatory Visit: Payer: Self-pay | Admitting: Internal Medicine

## 2018-03-26 ENCOUNTER — Telehealth: Payer: Self-pay | Admitting: Cardiovascular Disease

## 2018-03-26 NOTE — Telephone Encounter (Signed)
New message ° ° ° ° °Pt is returning call about lab work. °

## 2018-03-26 NOTE — Telephone Encounter (Signed)
Informed pt of results. Pt verbalized understanding. 

## 2018-04-10 ENCOUNTER — Encounter: Payer: Self-pay | Admitting: Physician Assistant

## 2018-06-08 ENCOUNTER — Telehealth: Payer: Self-pay | Admitting: *Deleted

## 2018-06-08 NOTE — Telephone Encounter (Signed)
   [facility] Pre-operative Risk Assessment    Request for surgical clearance:  1. What type of surgery is being performed? COLONOSCOPY AND UPPER ENDOSCOPY    2. When is this surgery scheduled? 06/16/18   3. What type of clearance is required (medical clearance vs. Pharmacy clearance to hold med vs. Both)? BOTH  4. Are there any medications that need to be held prior to surgery and how long?ASA    5. Practice name and name of physician performing surgery? [facility]    6. What is your office phone [phone]    7.   What is your office fax  number?[phone]  8.   Anesthesia type (None, local, MAC, general) ?MONITORED ANESTHESIA

## 2018-06-10 NOTE — Telephone Encounter (Signed)
  Left a message for the patient.  Asked them to call and ask for the person doing preop.  Theodore Demarkhonda Barrett, PA-C 06/10/2018 5:35 PM Beeper [phone]

## 2018-06-12 ENCOUNTER — Encounter: Payer: Self-pay | Admitting: *Deleted

## 2018-06-12 NOTE — Telephone Encounter (Signed)
Left message for patient to call back to assess for any clinical change since last OV in 02/2018.  72 y.o. male with hx of CAD s/p prior stent to RCA in 1999, tobacco abuse, COPD, hypertension, hyperlipidemia.  Recent Cardiac Catheterization in 01/2018 demonstrated patent RCA stent and non-obstructive disease elsewhere.    Request is for EGD/colo and to hold ASA.  With stent placed 20 years ago and recent cath showing patent stent, it should be ok to hold ASA if necessary.    RCRI:  0.9% risk of MACE.  Tereso NewcomerScott Pau Banh, PA-C    06/12/2018 9:00 AM

## 2018-06-16 ENCOUNTER — Ambulatory Visit: Payer: Medicare Other | Admitting: Anesthesiology

## 2018-06-16 ENCOUNTER — Encounter: Payer: Self-pay | Admitting: Anesthesiology

## 2018-06-16 ENCOUNTER — Ambulatory Visit
Admission: RE | Admit: 2018-06-16 | Discharge: 2018-06-16 | Disposition: A | Discharge status: Home / Self Care | Payer: Medicare Other | Source: Ambulatory Visit | Attending: Internal Medicine | Admitting: Internal Medicine

## 2018-06-16 ENCOUNTER — Encounter
Admission: RE | Disposition: A | Discharge status: Home / Self Care | Payer: Self-pay | Source: Ambulatory Visit | Attending: Internal Medicine

## 2018-06-16 DIAGNOSIS — I10 Essential (primary) hypertension: Secondary | ICD-10-CM | POA: Diagnosis not present

## 2018-06-16 DIAGNOSIS — Z955 Presence of coronary angioplasty implant and graft: Secondary | ICD-10-CM | POA: Insufficient documentation

## 2018-06-16 DIAGNOSIS — Z86010 Personal history of colonic polyps: Secondary | ICD-10-CM | POA: Diagnosis not present

## 2018-06-16 DIAGNOSIS — I2510 Atherosclerotic heart disease of native coronary artery without angina pectoris: Secondary | ICD-10-CM | POA: Diagnosis not present

## 2018-06-16 DIAGNOSIS — J449 Chronic obstructive pulmonary disease, unspecified: Secondary | ICD-10-CM | POA: Diagnosis not present

## 2018-06-16 DIAGNOSIS — F17200 Nicotine dependence, unspecified, uncomplicated: Secondary | ICD-10-CM | POA: Insufficient documentation

## 2018-06-16 DIAGNOSIS — R1310 Dysphagia, unspecified: Secondary | ICD-10-CM | POA: Diagnosis not present

## 2018-06-16 DIAGNOSIS — E785 Hyperlipidemia, unspecified: Secondary | ICD-10-CM | POA: Diagnosis not present

## 2018-06-16 DIAGNOSIS — Z79899 Other long term (current) drug therapy: Secondary | ICD-10-CM | POA: Insufficient documentation

## 2018-06-16 DIAGNOSIS — K219 Gastro-esophageal reflux disease without esophagitis: Secondary | ICD-10-CM | POA: Diagnosis not present

## 2018-06-16 DIAGNOSIS — Z7982 Long term (current) use of aspirin: Secondary | ICD-10-CM | POA: Diagnosis not present

## 2018-06-16 DIAGNOSIS — F329 Major depressive disorder, single episode, unspecified: Secondary | ICD-10-CM | POA: Insufficient documentation

## 2018-06-16 DIAGNOSIS — K5730 Diverticulosis of large intestine without perforation or abscess without bleeding: Secondary | ICD-10-CM | POA: Diagnosis not present

## 2018-06-16 DIAGNOSIS — K640 First degree hemorrhoids: Secondary | ICD-10-CM | POA: Diagnosis not present

## 2018-06-16 DIAGNOSIS — K3189 Other diseases of stomach and duodenum: Secondary | ICD-10-CM | POA: Insufficient documentation

## 2018-06-16 DIAGNOSIS — Z1211 Encounter for screening for malignant neoplasm of colon: Secondary | ICD-10-CM | POA: Diagnosis not present

## 2018-06-16 HISTORY — DX: Diverticulosis of intestine, part unspecified, without perforation or abscess without bleeding: K57.90

## 2018-06-16 HISTORY — DX: Major depressive disorder, single episode, unspecified: F32.9

## 2018-06-16 HISTORY — DX: Personal history of colonic polyps: Z86.010

## 2018-06-16 HISTORY — DX: Gastrointestinal hemorrhage, unspecified: K92.2

## 2018-06-16 HISTORY — DX: Scoliosis, unspecified: M41.9

## 2018-06-16 HISTORY — PX: ESOPHAGOGASTRODUODENOSCOPY (EGD) WITH PROPOFOL: SHX5813

## 2018-06-16 HISTORY — DX: Hyperkalemia: E87.5

## 2018-06-16 HISTORY — DX: Personal history of colon polyps, unspecified: Z86.0100

## 2018-06-16 HISTORY — PX: COLONOSCOPY WITH PROPOFOL: SHX5780

## 2018-06-16 HISTORY — DX: Depression, unspecified: F32.A

## 2018-06-16 HISTORY — DX: Hyperlipidemia, unspecified: E78.5

## 2018-06-16 HISTORY — DX: Personal history of other diseases of the digestive system: Z87.19

## 2018-06-16 SURGERY — COLONOSCOPY WITH PROPOFOL
Anesthesia: General

## 2018-06-16 MED ORDER — PHENYLEPHRINE HCL 10 MG/ML IJ SOLN
INTRAMUSCULAR | Status: DC | PRN
  Administered 2018-06-16: 100 ug via INTRAVENOUS

## 2018-06-16 MED ORDER — LIDOCAINE HCL (CARDIAC) PF 100 MG/5ML IV SOSY
PREFILLED_SYRINGE | INTRAVENOUS | Status: DC | PRN
  Administered 2018-06-16: 60 mg via INTRATRACHEAL

## 2018-06-16 MED ORDER — PROPOFOL 500 MG/50ML IV EMUL
INTRAVENOUS | Status: DC | PRN
  Administered 2018-06-16: 150 ug/kg/min via INTRAVENOUS

## 2018-06-16 MED ORDER — PROPOFOL 10 MG/ML IV BOLUS
INTRAVENOUS | Status: AC
  Filled 2018-06-16: qty 20

## 2018-06-16 MED ORDER — PROPOFOL 10 MG/ML IV BOLUS
INTRAVENOUS | Status: DC | PRN
  Administered 2018-06-16: 40 mg via INTRAVENOUS

## 2018-06-16 MED ORDER — LIDOCAINE HCL (PF) 1 % IJ SOLN
INTRAMUSCULAR | Status: AC
  Administered 2018-06-16: 0.3 mL via INTRADERMAL
  Filled 2018-06-16: qty 2

## 2018-06-16 MED ORDER — LIDOCAINE HCL (PF) 2 % IJ SOLN
INTRAMUSCULAR | Status: AC
  Filled 2018-06-16: qty 10

## 2018-06-16 MED ORDER — LIDOCAINE HCL (PF) 1 % IJ SOLN
2.0000 mL | Freq: Once | INTRAMUSCULAR | Status: AC
  Administered 2018-06-16: 0.3 mL via INTRADERMAL

## 2018-06-16 MED ORDER — SODIUM CHLORIDE 0.9 % IV SOLN
INTRAVENOUS | Status: DC
  Administered 2018-06-16: 1000 mL via INTRAVENOUS

## 2018-06-16 NOTE — Anesthesia Postprocedure Evaluation (Signed)
Anesthesia Post Note  Patient: Olsen R Spratling  Procedure(s) Performed: COLONOSCOPY WITH PROPOFOL (N/A ) ESOPHAGOGASTRODUODENOSCOPY (EGD) WITH PROPOFOL (N/A )  Patient location during evaluation: Endoscopy Anesthesia Type: General Level of consciousness: awake and alert Pain management: pain level controlled Vital Signs Assessment: post-procedure vital signs reviewed and stable Respiratory status: spontaneous breathing, nonlabored ventilation, respiratory function stable and patient connected to nasal cannula oxygen Cardiovascular status: blood pressure returned to baseline and stable Postop Assessment: no apparent nausea or vomiting Anesthetic complications: no     Last Vitals:  Vitals:   06/16/18 1329 06/16/18 1339  BP: 114/90 137/77  Pulse: 67   Resp: 17   Temp:    SpO2: 96% 97%    Last Pain:  Vitals:   06/16/18 1339  TempSrc:   PainSc: 0-No pain                 Kristian Mogg S

## 2018-06-16 NOTE — Op Note (Signed)
[facility] Gastroenterology Patient Name: [name] Procedure Date: 06/16/2018 12:31 PM MRN: [id number] Account #: [id number] Date of Birth: [birth year] Admit Type: Outpatient Age: 72 Room: [facility] ENDO ROOM 2 Gender: Male Note Status: Finalized Procedure:            Upper GI endoscopy Indications:          Dysphagia, Esophageal reflux Providers:            Boykin Nearing. Norma Fredrickson MD, MD Referring MD:         Barbette Reichmann, MD (Referring MD) Medicines:            Propofol per Anesthesia Complications:        No immediate complications. Procedure:            Pre-Anesthesia Assessment:                       - The risks and benefits of the procedure and the                        sedation options and risks were discussed with the                        patient. All questions were answered and informed                        consent was obtained.                       - Patient identification and proposed procedure were                        verified prior to the procedure by the nurse. The                        procedure was verified in the procedure room.                       - ASA Grade Assessment: III - A patient with severe                        systemic disease.                       - After reviewing the risks and benefits, the patient                        was deemed in satisfactory condition to undergo the                        procedure.                       After obtaining informed consent, the endoscope was                        passed under direct vision. Throughout the procedure,                        the patient's blood pressure, pulse, and oxygen  saturations were monitored continuously. The Endoscope                        was introduced through the mouth, and advanced to the                        third part of duodenum. The upper GI endoscopy was                        accomplished without difficulty. The patient tolerated                         the procedure well. Findings:      The Z-line was irregular and was found in the distal esophagus. Mucosa       was biopsied with a cold forceps for histology. One specimen bottle was       sent to pathology.      Patchy moderately erythematous mucosa without bleeding was found in the       gastric antrum.      The cardia and gastric fundus were normal on retroflexion.      The examined duodenum was normal.      Non-occlusive esophageal "B" ring Impression:           - Z-line irregular, in the distal esophagus. Biopsied.                       - Erythematous mucosa in the antrum.                       - Normal examined duodenum. Recommendation:       - Await pathology results.                       - Proceed with colonoscopy Procedure Code(s):    --- Professional ---                       [id number], Esophagogastroduodenoscopy, flexible, transoral;                        with biopsy, single or multiple Diagnosis Code(s):    --- Professional ---                       K21.9, Gastro-esophageal reflux disease without                        esophagitis                       R13.10, Dysphagia, unspecified                       K31.89, Other diseases of stomach and duodenum                       K22.8, Other specified diseases of esophagus CPT copyright 2017 American Medical Association. All rights reserved. The codes documented in this report are preliminary and upon coder review may  be revised to meet current compliance requirements. Stanton Kidney MD, MD 06/16/2018 12:57:31 PM This report has been signed electronically. Number of Addenda: 0 Note Initiated On: 06/16/2018 12:31 PM      [place]  [facility]

## 2018-06-16 NOTE — Anesthesia Preprocedure Evaluation (Signed)
Anesthesia Evaluation  Patient identified by MRN, date of birth, ID band Patient awake    Reviewed: Allergy & Precautions, NPO status , Patient's Chart, lab work & pertinent test results, reviewed documented beta blocker date and time   Airway Mallampati: II  TM Distance: >3 FB     Dental  (+) Chipped   Pulmonary COPD, Current Smoker,           Cardiovascular hypertension, Pt. on medications + angina + CAD, + Cardiac Stents and + CABG       Neuro/Psych PSYCHIATRIC DISORDERS Depression  Neuromuscular disease    GI/Hepatic GERD  ,  Endo/Other    Renal/GU      Musculoskeletal   Abdominal   Peds  Hematology  (+) anemia ,   Anesthesia Other Findings   Reproductive/Obstetrics                             Anesthesia Physical Anesthesia Plan  ASA: III  Anesthesia Plan: General   Post-op Pain Management:    Induction: Intravenous  PONV Risk Score and Plan:   Airway Management Planned:   Additional Equipment:   Intra-op Plan:   Post-operative Plan:   Informed Consent: I have reviewed the patients History and Physical, chart, labs and discussed the procedure including the risks, benefits and alternatives for the proposed anesthesia with the patient or authorized representative who has indicated his/her understanding and acceptance.     Plan Discussed with: CRNA  Anesthesia Plan Comments:         Anesthesia Quick Evaluation

## 2018-06-16 NOTE — Anesthesia Post-op Follow-up Note (Signed)
Anesthesia QCDR form completed.        

## 2018-06-16 NOTE — Op Note (Signed)
[facility] Gastroenterology Patient Name: [name] Procedure Date: 06/16/2018 12:30 PM MRN: [id number] Account #: [id number] Date of Birth: 1945/11/19 Admit Type: Outpatient Age: 72 Room: [facility] ENDO ROOM 2 Gender: Male Note Status: Finalized Procedure:            Colonoscopy Indications:          High risk colon cancer surveillance: Personal history                        of colonic polyps Providers:            Boykin Nearing. Norma Fredrickson MD, MD Referring MD:         Barbette Reichmann, MD (Referring MD) Medicines:            Propofol per Anesthesia Complications:        No immediate complications. Procedure:            Pre-Anesthesia Assessment:                       - The risks and benefits of the procedure and the                        sedation options and risks were discussed with the                        patient. All questions were answered and informed                        consent was obtained.                       - Patient identification and proposed procedure were                        verified prior to the procedure by the nurse. The                        procedure was verified in the procedure room.                       - ASA Grade Assessment: III - A patient with severe                        systemic disease.                       - After reviewing the risks and benefits, the patient                        was deemed in satisfactory condition to undergo the                        procedure.                       After obtaining informed consent, the colonoscope was                        passed under direct vision. Throughout the procedure,  the patient's blood pressure, pulse, and oxygen                        saturations were monitored continuously. The                        Colonoscope was introduced through the anus and                        advanced to the the cecum, identified by appendiceal   orifice and ileocecal valve. The colonoscopy was                        performed without difficulty. The patient tolerated the                        procedure well. The quality of the bowel preparation                        was adequate. Findings:      The perianal and digital rectal examinations were normal. Pertinent       negatives include normal sphincter tone and no palpable rectal lesions.      Many small-mouthed diverticula were found in the sigmoid colon.      Non-bleeding internal hemorrhoids were found during retroflexion. The       hemorrhoids were Grade I (internal hemorrhoids that do not prolapse).      The exam was otherwise without abnormality. Impression:           - Diverticulosis in the sigmoid colon.                       - Non-bleeding internal hemorrhoids.                       - The examination was otherwise normal.                       - No specimens collected. Recommendation:       - Patient has a contact number available for                        emergencies. The signs and symptoms of potential                        delayed complications were discussed with the patient.                        Return to normal activities tomorrow. Written discharge                        instructions were provided to the patient.                       - Resume previous diet.                       - Continue present medications.                       - No repeat colonoscopy due to age.                       -  Return to physician assistant PRN.                       - The findings and recommendations were discussed with                        the patient and their spouse. Procedure Code(s):    --- Professional ---                       O9629, Colorectal cancer screening; colonoscopy on                        individual at high risk Diagnosis Code(s):    --- Professional ---                       K57.30, Diverticulosis of large intestine without                         perforation or abscess without bleeding                       K64.0, First degree hemorrhoids                       Z86.010, Personal history of colonic polyps CPT copyright 2017 American Medical Association. All rights reserved. The codes documented in this report are preliminary and upon coder review may  be revised to meet current compliance requirements. Stanton Kidney MD, MD 06/16/2018 1:16:34 PM This report has been signed electronically. Number of Addenda: 0 Note Initiated On: 06/16/2018 12:30 PM Scope Withdrawal Time: 0 hours 4 minutes 44 seconds  Total Procedure Duration: 0 hours 11 minutes 0 seconds       [facility]

## 2018-06-16 NOTE — Interval H&P Note (Signed)
History and Physical Interval Note:  06/16/2018 12:22 PM  [name]  has presented today for surgery, with the diagnosis of PRS HX COLON POLYPS GERD ESOPHAGITIS  The various methods of treatment have been discussed with the patient and family. After consideration of risks, benefits and other options for treatment, the patient has consented to  Procedure(s): COLONOSCOPY WITH PROPOFOL (N/A) ESOPHAGOGASTRODUODENOSCOPY (EGD) WITH PROPOFOL (N/A) as a surgical intervention .  The patient's history has been reviewed, patient examined, no change in status, stable for surgery.  I have reviewed the patient's chart and labs.  Questions were answered to the patient's satisfaction.     [place], [place]

## 2018-06-16 NOTE — H&P (Signed)
Outpatient short stay form Pre-procedure 06/16/2018 12:18 PM Matthew K. Norma Fredrickson, M.D.  Primary Physician: Barbette Reichmann, M.D.  Reason for visit: Dysphagia, GERD, personal hx of colon polyps  History of present illness:  Patient with hx of intermittent GERD, dysphagia (latter resolving) has hx of colon polyps. Denies change in bowel habits or rectal bleeding.    Current Facility-Administered Medications:  .  0.9 %  sodium chloride infusion, , Intravenous, Continuous, Toledo, Villas K, MD .  lidocaine (PF) (XYLOCAINE) 1 % injection 2 mL, 2 mL, Intradermal, Once, Carlin, Plymouth K, MD .  lidocaine (PF) (XYLOCAINE) 1 % injection, , , ,   Medications Prior to Admission  Medication Sig Dispense Refill Last Dose  . albuterol (PROVENTIL HFA;VENTOLIN HFA) 108 (90 Base) MCG/ACT inhaler Inhale 2 puffs into the lungs every 6 (six) hours as needed for wheezing or shortness of breath.   06/16/2018 at 0600  . simvastatin (ZOCOR) 80 MG tablet Take 80 mg by mouth daily.     Marland Kitchen acetaminophen (TYLENOL) 500 MG tablet Take 1,000 mg by mouth every 12 (twelve) hours as needed for mild pain, fever or headache (sleep).    Taking  . aspirin EC 81 MG tablet Take 1 tablet (81 mg total) by mouth daily. 90 tablet 3 06/15/2018  . azithromycin (ZITHROMAX) 250 MG tablet As directed z pack 6 tablet 0   . buPROPion (WELLBUTRIN XL) 150 MG 24 hr tablet Take 150 mg by mouth at bedtime.   Taking  . cyanocobalamin 500 MCG tablet Take 500 mcg by mouth 2 (two) times daily. VITAMIN B-12   Taking  . diphenhydramine-acetaminophen (TYLENOL PM) 25-500 MG TABS tablet Take 1 tablet by mouth at bedtime as needed (for sleep).   Taking  . escitalopram (LEXAPRO) 20 MG tablet Take 1 tablet (20 mg total) by mouth daily. 90 tablet 0 Taking  . esomeprazole (NEXIUM) 40 MG capsule Take 1 capsule (40 mg total) by mouth daily. 90 capsule 3 Taking  . ibuprofen (ADVIL,MOTRIN) 800 MG tablet Take 800 mg by mouth every 8 (eight) hours as needed (FOR  PAIN.).   Taking  . montelukast (SINGULAIR) 10 MG tablet TAKE 1 TABLET DAILY 90 tablet 2   . morphine (MS CONTIN) 15 MG 12 hr tablet Take 15 mg by mouth 2 (two) times daily.    Taking  . oxyCODONE-acetaminophen (PERCOCET) 5-325 MG per tablet Take 1 tablet by mouth every 4 (four) hours as needed. For pain   06/16/2018 at 0500  . predniSONE (STERAPRED UNI-PAK 21 TAB) 10 MG (21) TBPK tablet As directed 21 tablet 0   . rosuvastatin (CRESTOR) 40 MG tablet Take 1 tablet (40 mg total) by mouth daily. 90 tablet 3 Taking  . tiotropium (SPIRIVA) 18 MCG inhalation capsule Place 1 capsule (18 mcg total) into inhaler and inhale at bedtime. 90 capsule 1   . valsartan (DIOVAN) 160 MG tablet Take 1 tablet (160 mg total) by mouth daily. 90 tablet 0   . Vitamin D, Ergocalciferol, (DRISDOL) 50000 UNITS CAPS capsule Take 50,000 Units by mouth every Wednesday.    Taking     Allergies  Allergen Reactions  . Levofloxacin Itching and Rash     Past Medical History:  Diagnosis Date  . Anemia   . Back pain    scoliosis  . CAD (coronary artery disease)    Stent RCA 1999. Last cath April 2010 with patent RCA stent, 30% LM, mild plaque LAD and RCA  . COPD (chronic obstructive pulmonary disease) (HCC)   .  Depression   . Diverticulosis   . GI bleed   . History of colon polyps   . History of hiatal hernia   . Hyperkalemia   . Hyperlipidemia   . Hypertension   . Scoliosis     Review of systems:  Otherwise negative.    Physical Exam  Gen: Alert, oriented. Appears stated age.  HEENT: [place]/AT. PERRLA. Lungs: CTA, no wheezes. CV: RR nl S1, S2. Abd: soft, benign, no masses. BS+ Ext: No edema. Pulses 2+    Planned procedures: Proceed with EGD and colonoscopy. The patient understands the nature of the planned procedure, indications, risks, alternatives and potential complications including but not limited to bleeding, infection, perforation, damage to internal organs and possible oversedation/side effects from  anesthesia. The patient agrees and gives consent to proceed.  Please refer to procedure notes for findings, recommendations and patient disposition/instructions.     Matthew K. Norma Fredrickson, M.D. Gastroenterology 06/16/2018  12:18 PM

## 2018-06-16 NOTE — Telephone Encounter (Signed)
Left voice mail to call between pre-op hours.  

## 2018-06-16 NOTE — Transfer of Care (Signed)
Immediate Anesthesia Transfer of Care Note  Patient: [name]  Procedure(s) Performed: COLONOSCOPY WITH PROPOFOL (N/A ) ESOPHAGOGASTRODUODENOSCOPY (EGD) WITH PROPOFOL (N/A )  Patient Location: PACU  Anesthesia Type:General  Level of Consciousness: awake and oriented  Airway & Oxygen Therapy: Patient Spontanous Breathing and Patient connected to nasal cannula oxygen  Post-op Assessment: Report given to RN and Post -op Vital signs reviewed and stable  Post vital signs: Reviewed and stable  Last Vitals:  Vitals Value Taken Time  BP 114/90 06/16/2018  1:29 PM  Temp 36.1 C 06/16/2018  1:19 PM  Pulse 61 06/16/2018  1:32 PM  Resp 14 06/16/2018  1:32 PM  SpO2 97 % 06/16/2018  1:32 PM  Vitals shown include unvalidated device data.  Last Pain:  Vitals:   06/16/18 1329  TempSrc:   PainSc: 0-No pain         Complications: No apparent anesthesia complications

## 2018-06-17 LAB — SURGICAL PATHOLOGY

## 2018-06-18 ENCOUNTER — Encounter: Payer: Self-pay | Admitting: Internal Medicine

## 2018-06-19 NOTE — Telephone Encounter (Signed)
Able to reach patient today. He says he never check's his voice mail. He already had his procedure done 06/16/18. Will remove from Pre-op.

## 2018-06-22 ENCOUNTER — Ambulatory Visit: Payer: Self-pay | Admitting: Internal Medicine

## 2018-06-23 ENCOUNTER — Other Ambulatory Visit: Payer: Self-pay | Admitting: Cardiovascular Disease

## 2018-09-20 ENCOUNTER — Other Ambulatory Visit: Payer: Self-pay | Admitting: Internal Medicine

## 2018-12-02 ENCOUNTER — Other Ambulatory Visit: Payer: Self-pay | Admitting: Internal Medicine

## 2018-12-09 ENCOUNTER — Other Ambulatory Visit: Payer: Self-pay

## 2018-12-09 ENCOUNTER — Emergency Department: Payer: Medicare Other

## 2018-12-09 ENCOUNTER — Emergency Department
Admission: EM | Admit: 2018-12-09 | Discharge: 2018-12-09 | Disposition: A | Discharge status: Home / Self Care | Payer: Medicare Other | Attending: Emergency Medicine | Admitting: Emergency Medicine

## 2018-12-09 ENCOUNTER — Encounter: Payer: Self-pay | Admitting: *Deleted

## 2018-12-09 DIAGNOSIS — R531 Weakness: Secondary | ICD-10-CM | POA: Insufficient documentation

## 2018-12-09 DIAGNOSIS — H9209 Otalgia, unspecified ear: Secondary | ICD-10-CM | POA: Insufficient documentation

## 2018-12-09 DIAGNOSIS — I10 Essential (primary) hypertension: Secondary | ICD-10-CM | POA: Diagnosis not present

## 2018-12-09 DIAGNOSIS — F17210 Nicotine dependence, cigarettes, uncomplicated: Secondary | ICD-10-CM | POA: Insufficient documentation

## 2018-12-09 DIAGNOSIS — R05 Cough: Secondary | ICD-10-CM | POA: Insufficient documentation

## 2018-12-09 DIAGNOSIS — J441 Chronic obstructive pulmonary disease with (acute) exacerbation: Secondary | ICD-10-CM | POA: Diagnosis not present

## 2018-12-09 DIAGNOSIS — R6889 Other general symptoms and signs: Secondary | ICD-10-CM

## 2018-12-09 DIAGNOSIS — R0981 Nasal congestion: Secondary | ICD-10-CM | POA: Insufficient documentation

## 2018-12-09 DIAGNOSIS — J111 Influenza due to unidentified influenza virus with other respiratory manifestations: Secondary | ICD-10-CM | POA: Diagnosis not present

## 2018-12-09 DIAGNOSIS — R509 Fever, unspecified: Secondary | ICD-10-CM | POA: Diagnosis not present

## 2018-12-09 DIAGNOSIS — R070 Pain in throat: Secondary | ICD-10-CM | POA: Diagnosis present

## 2018-12-09 LAB — BASIC METABOLIC PANEL
Anion gap: 12 (ref 5–15)
BUN: 18 mg/dL (ref 8–23)
CALCIUM: 8.8 mg/dL — AB (ref 8.9–10.3)
CHLORIDE: 101 mmol/L (ref 98–111)
CO2: 22 mmol/L (ref 22–32)
CREATININE: 0.97 mg/dL (ref 0.61–1.24)
GFR calc non Af Amer: >60 mL/min (ref >60)
GLUCOSE: 90 mg/dL (ref 70–99)
Potassium: 3.6 mmol/L (ref 3.5–5.1)
Sodium: 135 mmol/L (ref 135–145)

## 2018-12-09 LAB — CBC
HCT: 35.2 % — ABNORMAL LOW (ref 39.0–52.0)
Hemoglobin: 11.8 g/dL — ABNORMAL LOW (ref 13.0–17.0)
MCH: 32.6 pg (ref 26.0–34.0)
MCHC: 33.5 g/dL (ref 30.0–36.0)
MCV: 97.2 fL (ref 80.0–100.0)
NRBC: 0 % (ref 0.0–0.2)
Platelets: 157 10*3/uL (ref 150–400)
RBC: 3.62 MIL/uL — ABNORMAL LOW (ref 4.22–5.81)
RDW: 12.8 % (ref 11.5–15.5)
WBC: 8.3 10*3/uL (ref 4.0–10.5)

## 2018-12-09 LAB — INFLUENZA PANEL BY PCR (TYPE A & B)
Influenza A By PCR: NEGATIVE
Influenza B By PCR: NEGATIVE

## 2018-12-09 LAB — TROPONIN I

## 2018-12-09 MED ORDER — ALBUTEROL SULFATE HFA 108 (90 BASE) MCG/ACT IN AERS: 2.0000 | INHALATION_SPRAY | Freq: Four times a day (QID) | RESPIRATORY_TRACT | 2 refills | Status: DC | PRN

## 2018-12-09 MED ORDER — IPRATROPIUM-ALBUTEROL 0.5-2.5 (3) MG/3ML IN SOLN
3.0000 mL | Freq: Once | RESPIRATORY_TRACT | Status: AC
  Administered 2018-12-09: 3 mL via RESPIRATORY_TRACT
  Filled 2018-12-09: qty 3

## 2018-12-09 MED ORDER — HYDROCOD POLST-CPM POLST ER 10-8 MG/5ML PO SUER
5.0000 mL | Freq: Once | ORAL | Status: AC
  Administered 2018-12-09: 5 mL via ORAL
  Filled 2018-12-09: qty 5

## 2018-12-09 MED ORDER — KETOROLAC TROMETHAMINE 30 MG/ML IJ SOLN
15.0000 mg | Freq: Once | INTRAMUSCULAR | Status: AC
  Administered 2018-12-09: 15 mg via INTRAVENOUS
  Filled 2018-12-09: qty 1

## 2018-12-09 MED ORDER — AZITHROMYCIN 250 MG PO TABS: ORAL_TABLET | ORAL | 0 refills | Status: DC

## 2018-12-09 MED ORDER — PREDNISONE 20 MG PO TABS: 60.0000 mg | ORAL_TABLET | Freq: Once | ORAL | Status: DC

## 2018-12-09 MED ORDER — METHYLPREDNISOLONE SODIUM SUCC 125 MG IJ SOLR
125.0000 mg | Freq: Once | INTRAMUSCULAR | Status: AC
  Administered 2018-12-09: 125 mg via INTRAVENOUS
  Filled 2018-12-09: qty 2

## 2018-12-09 MED ORDER — PREDNISONE 50 MG PO TABS: ORAL_TABLET | ORAL | 0 refills | Status: DC

## 2018-12-09 NOTE — ED Notes (Signed)
First nurse note: pt presents to ED via [facility] c/o cough/SOB. EMS report initial O2 sat in low 90s but drops easily with exertion. Arrives on 2L via n/c. Pt given x1 duoneb for wheezing PTA. Pt reports cough productive of yellow sputum. Smokes 1/2 PPD. States has not eaten well x3 days.

## 2018-12-09 NOTE — ED Provider Notes (Addendum)
[facility] Emergency Department Provider Note       Time seen: ----------------------------------------- 5:54 PM on 12/09/2018 -----------------------------------------   I have reviewed the triage vital signs and the nursing notes.  HISTORY   Chief Complaint Influenza and Shortness of Breath    HPI [name] is a 73 y.o. male with a history of anemia, coronary disease, COPD, hyperkalemia, hyperlipidemia who presents to the ED for leg symptoms for the past week.  Patient reports sore throat with ear pain congestion and cough.  He is also having fevers, chills and body aches with weakness today.  Past Medical History:  Diagnosis Date  . Anemia   . Back pain    scoliosis  . CAD (coronary artery disease)    Stent RCA 1999. Last cath April 2010 with patent RCA stent, 30% LM, mild plaque LAD and RCA  . COPD (chronic obstructive pulmonary disease) (HCC)   . Depression   . Diverticulosis   . GI bleed   . History of colon polyps   . History of hiatal hernia   . Hyperkalemia   . Hyperlipidemia   . Hypertension   . Scoliosis     Patient Active Problem List   Diagnosis Date Noted  . 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 Benign essential hypertension 02/13/2017  . Centrilobular emphysema (HCC) 02/13/2017  . Chronic bilateral low back pain with right-sided sciatica 02/13/2017  . Gastroesophageal reflux disease 02/13/2017  . Major depressive disorder, recurrent, in full remission (HCC) 02/13/2017  . CAD in native artery 03/24/2011  . Tobacco user 03/24/2011  . Hypercholesterolemia 03/24/2011    Past Surgical History:  Procedure Laterality Date  . APPENDECTOMY  1957  . BACK SURGERY    . CARDIAC CATHETERIZATION  1999   stent placement RCA  . CARDIAC CATHETERIZATION  2010   mild left CA distal stenosis, mild irregularities in RCA and LAD. patent setn in RCA  . COLONOSCOPY WITH  PROPOFOL N/A 06/16/2018   Procedure: COLONOSCOPY WITH PROPOFOL;  Surgeon: Toledo, Boykin Nearing, MD;  Location: [facility] ENDOSCOPY;  Service: Gastroenterology;  Laterality: N/A;  . CORONARY ARTERY BYPASS GRAFT    . ESOPHAGOGASTRODUODENOSCOPY (EGD) WITH PROPOFOL N/A 06/16/2018   Procedure: ESOPHAGOGASTRODUODENOSCOPY (EGD) WITH PROPOFOL;  Surgeon: Toledo, Boykin Nearing, MD;  Location: [facility] ENDOSCOPY;  Service: Gastroenterology;  Laterality: N/A;  . FOOT SURGERY  1973   left  . LEFT HEART CATH AND CORONARY ANGIOGRAPHY N/A 01/22/2018   Procedure: LEFT HEART CATH AND CORONARY ANGIOGRAPHY;  Surgeon: Kathleene Hazel, MD;  Location: [facility] INVASIVE CV LAB;  Service: Cardiovascular;  Laterality: N/A;  . LEFT HEART CATHETERIZATION WITH CORONARY ANGIOGRAM N/A 01/11/2015   Procedure: LEFT HEART CATHETERIZATION WITH CORONARY ANGIOGRAM;  Surgeon: Kathleene Hazel, MD;  Location: [facility] CATH [facility];  Service: Cardiovascular;  Laterality: N/A;    Allergies Levofloxacin  Social History Social History   Tobacco Use  . Smoking status: Current Every Day Smoker    Packs/day: 1.00    Years: 35.00    Pack years: 35.00    Types: Cigarettes  . Smokeless tobacco: Never Used  Substance Use Topics  . Alcohol use: Yes    Alcohol/week: 7.0 standard drinks    Types: 7 Standard drinks or equivalent per week  . Drug use: No   Review of Systems Constitutional: Positive for fevers, chills, aches Cardiovascular: Negative for chest pain. Respiratory: Negative for shortness of breath.  Positive for cough  Gastrointestinal: Negative for abdominal pain, vomiting and diarrhea. Musculoskeletal: Negative for back pain. Skin: Negative for rash. Neurological: Negative for headaches, positive for weakness  All systems negative/normal/unremarkable except as stated in the HPI  ____________________________________________   PHYSICAL EXAM:  VITAL SIGNS: ED Triage Vitals  Enc Vitals Group     BP 12/09/18 1558 106/88     Pulse Rate  12/09/18 1558 95     Resp 12/09/18 1558 20     Temp 12/09/18 1558 98.4 F (36.9 C)     Temp Source 12/09/18 1558 Oral     SpO2 12/09/18 1558 95 %     Weight 12/09/18 1559 158 lb 15.2 oz (72.1 kg)     Height 12/09/18 1559  (1.727 m)     Head Circumference --      Peak Flow --      Pain Score 12/09/18 1559 5     Pain Loc --      Pain Edu? --      Excl. in GC? --    Constitutional: Alert and oriented.  Mild distress Eyes: Conjunctivae are normal. Normal extraocular movements. ENT      Head: Normocephalic and atraumatic.      Nose: No congestion/rhinnorhea.      Mouth/Throat: Mucous membranes are moist.      Neck: No stridor. Cardiovascular: Normal rate, regular rhythm. No murmurs, rubs, or gallops. Respiratory: Bilateral wheezing and rhonchi are noted Gastrointestinal: Soft and nontender. Normal bowel sounds Musculoskeletal: Nontender with normal range of motion in extremities. No lower extremity tenderness nor edema. Neurologic:  Normal speech and language. No gross focal neurologic deficits are appreciated.  Skin:  Skin is warm, dry and intact. No rash noted. Psychiatric: Mood and affect are normal. Speech and behavior are normal.  ____________________________________________  EKG: Interpreted by me.  Sinus rhythm rate of 97 bpm, long QT, normal QRS, normal axis  ____________________________________________  ED COURSE:  As part of my medical decision making, I reviewed the following data within the electronic MEDICAL RECORD NUMBER History obtained from family if available, nursing notes, old chart and ekg, as well as notes from prior ED visits. Patient presented for flulike symptoms, we will assess with labs and imaging as indicated at this time.   Procedures ____________________________________________   LABS (pertinent positives/negatives)  Labs Reviewed  BASIC METABOLIC PANEL - Abnormal; Notable for the following components:      Result Value   Calcium 8.8 (*)     All other components within normal limits  CBC - Abnormal; Notable for the following components:   RBC 3.62 (*)    Hemoglobin 11.8 (*)    HCT 35.2 (*)    All other components within normal limits  INFLUENZA PANEL BY PCR (TYPE A & B)  TROPONIN I    RADIOLOGY Chest x-ray did not reveal any acute process  ____________________________________________   DIFFERENTIAL DIAGNOSIS   Influenza, pneumonia, PE, pneumothorax, COPD, influenza-like illness  FINAL ASSESSMENT AND PLAN  Influenza-like illness, COPD   Plan: The patient had presented for flulike symptoms with wheezing and shortness of breath. Patient's labs are reassuring. Patient's imaging did not reveal any acute process.  He was given duo nebs and steroids and is cleared for outpatient follow-up with his doctor.   Ulice Dash, MD    Note: This note was generated in part or whole with voice recognition software. Voice recognition is usually quite accurate but there are transcription errors that can and very often do occur.  I apologize for any typographical errors that were not detected and corrected.     Emily Filbert, MD 12/09/18 1759    Emily Filbert, MD 12/09/18 Windell Moment

## 2018-12-09 NOTE — ED Triage Notes (Signed)
Pt reporting flu like symptoms x 1-2 weeks. Sore throat has progressed to ear pain bilaterally with congestion and cough. Pt reports increased weakness and SOB upon exertion. Pt does not wear home oxygen but arrived to ED via EMS with 2L [place] on. RA saturation in triage is 95%.

## 2018-12-14 ENCOUNTER — Other Ambulatory Visit (HOSPITAL_COMMUNITY): Payer: Self-pay

## 2018-12-21 ENCOUNTER — Other Ambulatory Visit (HOSPITAL_COMMUNITY): Payer: Self-pay

## 2018-12-25 ENCOUNTER — Other Ambulatory Visit (HOSPITAL_COMMUNITY): Payer: Self-pay

## 2018-12-30 ENCOUNTER — Other Ambulatory Visit (HOSPITAL_COMMUNITY): Payer: Self-pay

## 2019-01-12 ENCOUNTER — Other Ambulatory Visit: Payer: Self-pay | Admitting: Cardiovascular Disease

## 2019-01-12 DIAGNOSIS — I10 Essential (primary) hypertension: Secondary | ICD-10-CM

## 2019-01-12 DIAGNOSIS — E7800 Pure hypercholesterolemia, unspecified: Secondary | ICD-10-CM

## 2019-01-12 DIAGNOSIS — I2510 Atherosclerotic heart disease of native coronary artery without angina pectoris: Secondary | ICD-10-CM

## 2019-01-12 MED ORDER — ROSUVASTATIN CALCIUM 40 MG PO TABS: 40.0000 mg | ORAL_TABLET | Freq: Every day | ORAL | 1 refills | Status: DC

## 2019-01-12 NOTE — Telephone Encounter (Signed)
Pt's medication was sent to pt's pharmacy as requested. Confirmation received.  °

## 2019-03-02 ENCOUNTER — Other Ambulatory Visit: Payer: Self-pay | Admitting: Internal Medicine

## 2019-03-02 NOTE — Telephone Encounter (Signed)
Pt needs follow up

## 2019-03-20 ENCOUNTER — Other Ambulatory Visit: Payer: Self-pay | Admitting: Cardiovascular Disease

## 2019-03-24 ENCOUNTER — Other Ambulatory Visit: Payer: Self-pay | Admitting: Cardiovascular Disease

## 2019-03-24 MED ORDER — VALSARTAN 160 MG PO TABS: 160.0000 mg | ORAL_TABLET | Freq: Every day | ORAL | 0 refills | Status: DC

## 2019-03-24 NOTE — Telephone Encounter (Signed)
Pt's medication was sent to pt's pharmacy as requested. Confirmation received.  °

## 2019-06-22 ENCOUNTER — Other Ambulatory Visit: Payer: Self-pay | Admitting: Cardiovascular Disease

## 2019-06-24 ENCOUNTER — Telehealth: Payer: Self-pay

## 2019-06-24 NOTE — Telephone Encounter (Signed)
Called pt and lmom saying that we received a refill request and he will need an appt with DSK for further refills

## 2019-07-08 ENCOUNTER — Encounter: Payer: Self-pay | Admitting: Internal Medicine

## 2019-07-08 ENCOUNTER — Ambulatory Visit: Payer: Medicare Other | Admitting: Internal Medicine

## 2019-07-08 ENCOUNTER — Other Ambulatory Visit: Payer: Self-pay

## 2019-07-08 VITALS — Wt 160.0 lb

## 2019-07-08 DIAGNOSIS — J449 Chronic obstructive pulmonary disease, unspecified: Secondary | ICD-10-CM | POA: Diagnosis not present

## 2019-07-08 DIAGNOSIS — I25758 Atherosclerosis of native coronary artery of transplanted heart with other forms of angina pectoris: Secondary | ICD-10-CM | POA: Diagnosis not present

## 2019-07-08 DIAGNOSIS — F17210 Nicotine dependence, cigarettes, uncomplicated: Secondary | ICD-10-CM

## 2019-07-08 NOTE — Progress Notes (Signed)
[facility] [address] [place], [place] [address]  Internal MEDICINE  Telephone Visit  Patient Name: [name]  [id number]  [id number]  Date of Service: 07/08/2019  I connected with the patient at 1228 by telephone and verified the patients identity using two identifiers.   I discussed the limitations, risks, security and privacy concerns of performing an evaluation and management service by telephone and the availability of in person appointments. I also discussed with the patient that there may be a patient responsible charge related to the service.  The patient expressed understanding and agrees to proceed.    Chief Complaint  Patient presents with  . Telephone Assessment  . Telephone Screen  . COPD    HPI  Pt seen via telephone for follow up  Pulmonary. Pt reports he is doing very well at this time.  He is using his inhalers and denies any issues.  He unfortunately continues to smoke however he is trying to reduce the amount. HE denies any chest pain, sob, or recent hospitalizations.      Current Medication: Outpatient Encounter Medications as of 07/08/2019  Medication Sig Note  . acetaminophen (TYLENOL) 500 MG tablet Take 1,000 mg by mouth every 12 (twelve) hours as needed for mild pain, fever or headache (sleep).  01/19/2018: MAX OF 4 TABLETS DAILY  . albuterol (PROVENTIL HFA;VENTOLIN HFA) 108 (90 Base) MCG/ACT inhaler Inhale 2 puffs into the lungs every 6 (six) hours as needed for wheezing or shortness of breath.   Marland Kitchen. albuterol (PROVENTIL HFA;VENTOLIN HFA) 108 (90 Base) MCG/ACT inhaler Inhale 2 puffs into the lungs every 6 (six) hours as needed for wheezing or shortness of breath.   Marland Kitchen. aspirin EC 81 MG tablet Take 1 tablet (81 mg total) by mouth daily.   Marland Kitchen. azithromycin (ZITHROMAX Z-PAK) 250 MG tablet Take 2 tablets (500 mg) on  Day 1,  followed by 1 tablet (250 mg) once daily on Days 2 through 5.   . azithromycin (ZITHROMAX) 250 MG tablet As directed z pack   .  buPROPion (WELLBUTRIN XL) 150 MG 24 hr tablet Take 150 mg by mouth at bedtime.   . cyanocobalamin 500 MCG tablet Take 500 mcg by mouth 2 (two) times daily. VITAMIN B-12   . diphenhydramine-acetaminophen (TYLENOL PM) 25-500 MG TABS tablet Take 1 tablet by mouth at bedtime as needed (for sleep).   Marland Kitchen. escitalopram (LEXAPRO) 20 MG tablet Take 1 tablet (20 mg total) by mouth daily.   Marland Kitchen. esomeprazole (NEXIUM) 40 MG capsule Take 1 capsule (40 mg total) by mouth daily.   Marland Kitchen. ibuprofen (ADVIL,MOTRIN) 800 MG tablet Take 800 mg by mouth every 8 (eight) hours as needed (FOR PAIN.).   Marland Kitchen. montelukast (SINGULAIR) 10 MG tablet TAKE 1 TABLET DAILY (NEEDS TO SCHEDULE APPOINTMENT FOR REFILLS)   . morphine (MS CONTIN) 15 MG 12 hr tablet Take 15 mg by mouth 2 (two) times daily.    Marland Kitchen. oxyCODONE-acetaminophen (PERCOCET) 5-325 MG per tablet Take 1 tablet by mouth every 4 (four) hours as needed. For pain   . predniSONE (DELTASONE) 50 MG tablet Take 1 tablet by mouth daily   . predniSONE (STERAPRED UNI-PAK 21 TAB) 10 MG (21) TBPK tablet As directed   . rosuvastatin (CRESTOR) 40 MG tablet Take 1 tablet (40 mg total) by mouth daily.   . simvastatin (ZOCOR) 80 MG tablet Take 80 mg by mouth daily.   Marland Kitchen. SPIRIVA HANDIHALER 18 MCG inhalation capsule INHALE THE CONTENTS OF 1 CAPSULE AT BEDTIME  AS DIRECTED PER PACKAGE INSTRUCTIONS   . valsartan (DIOVAN) 160 MG tablet Take 1 tablet (160 mg total) by mouth daily. Please make overdue appt with Dr. Clifton James before anymore refills. 2nd attempt   . Vitamin D, Ergocalciferol, (DRISDOL) 50000 UNITS CAPS capsule Take 50,000 Units by mouth every Wednesday.     No facility-administered encounter medications on file as of 07/08/2019.     Surgical History: Past Surgical History:  Procedure Laterality Date  . APPENDECTOMY  1957  . BACK SURGERY    . CARDIAC CATHETERIZATION  1999   stent placement RCA  . CARDIAC CATHETERIZATION  2010   mild left CA distal stenosis, mild irregularities in RCA and  LAD. patent setn in RCA  . COLONOSCOPY WITH PROPOFOL N/A 06/16/2018   Procedure: COLONOSCOPY WITH PROPOFOL;  Surgeon: Toledo, Boykin Nearing, MD;  Location: [facility] ENDOSCOPY;  Service: Gastroenterology;  Laterality: N/A;  . CORONARY ARTERY BYPASS GRAFT    . ESOPHAGOGASTRODUODENOSCOPY (EGD) WITH PROPOFOL N/A 06/16/2018   Procedure: ESOPHAGOGASTRODUODENOSCOPY (EGD) WITH PROPOFOL;  Surgeon: Toledo, Boykin Nearing, MD;  Location: [facility] ENDOSCOPY;  Service: Gastroenterology;  Laterality: N/A;  . FOOT SURGERY  1973   left  . LEFT HEART CATH AND CORONARY ANGIOGRAPHY N/A 01/22/2018   Procedure: LEFT HEART CATH AND CORONARY ANGIOGRAPHY;  Surgeon: Kathleene Hazel, MD;  [facility];  Service: Cardiovascular;  Laterality: N/A;  . LEFT HEART CATHETERIZATION WITH CORONARY ANGIOGRAM N/A 01/11/2015   Procedure: LEFT HEART CATHETERIZATION WITH CORONARY ANGIOGRAM;  Surgeon: Kathleene Hazel, MD;  Location: [facility] CATH LAB;  Service: Cardiovascular;  Laterality: N/A;    Medical History: Past Medical History:  Diagnosis Date  . Anemia   . Back pain    scoliosis  . CAD (coronary artery disease)    Stent RCA 1999. Last cath April 2010 with patent RCA stent, 30% LM, mild plaque LAD and RCA  . COPD (chronic obstructive pulmonary disease) (HCC)   . Depression   . Diverticulosis   . GI bleed   . History of colon polyps   . History of hiatal hernia   . Hyperkalemia   . Hyperlipidemia   . Hypertension   . Scoliosis     Family History: Family History  Problem Relation Age of Onset  . Alzheimer's disease Father   . Angina Father   . Diabetes Cousin        mothers side  . Heart attack Cousin        fathers side  . Hyperlipidemia Cousin        fathers side  . Hypertension Cousin        fathers side    Social History   Socioeconomic History  . Marital status: Divorced    Spouse name: Not on file  . Number of children: 3  . Years of education: Not on file  . Highest education level: Not  on file  Occupational History    Employer: RETIRED  Social Needs  . Financial resource strain: Not on file  . Food insecurity    Worry: Not on file    Inability: Not on file  . Transportation needs    Medical: Not on file    Non-medical: Not on file  Tobacco Use  . Smoking status: Current Every Day Smoker    Packs/day: 1.00    Years: 35.00    Pack years: 35.00    Types: Cigarettes  . Smokeless tobacco: Never Used  Substance and Sexual Activity  . Alcohol use:  Yes    Alcohol/week: 7.0 standard drinks    Types: 7 Standard drinks or equivalent per week  . Drug use: No  . Sexual activity: Not on file  Lifestyle  . Physical activity    Days per week: Not on file    Minutes per session: Not on file  . Stress: Not on file  Relationships  . Social Herbalist on phone: Not on file    Gets together: Not on file    Attends religious service: Not on file    Active member of club or organization: Not on file    Attends meetings of clubs or organizations: Not on file    Relationship status: Not on file  . Intimate partner violence    Fear of current or ex partner: Not on file    Emotionally abused: Not on file    Physically abused: Not on file    Forced sexual activity: Not on file  Other Topics Concern  . Not on file  Social History Narrative  . Not on file      Review of Systems  Constitutional: Negative.  Negative for chills, fatigue and unexpected weight change.  HENT: Negative.  Negative for congestion, rhinorrhea, sneezing and sore throat.   Eyes: Negative for redness.  Respiratory: Negative.  Negative for cough, chest tightness and shortness of breath.   Cardiovascular: Negative.  Negative for chest pain and palpitations.  Gastrointestinal: Negative.  Negative for abdominal pain, constipation, diarrhea, nausea and vomiting.  Endocrine: Negative.   Genitourinary: Negative.  Negative for dysuria and frequency.  Musculoskeletal: Negative.  Negative for  arthralgias, back pain, joint swelling and neck pain.  Skin: Negative.  Negative for rash.  Allergic/Immunologic: Negative.   Neurological: Negative.  Negative for tremors and numbness.  Hematological: Negative for adenopathy. Does not bruise/bleed easily.  Psychiatric/Behavioral: Negative.  Negative for behavioral problems, sleep disturbance and suicidal ideas. The patient is not nervous/anxious.     Vital Signs: Wt 160 lb (72.6 kg)   BMI 24.33 kg/m    Observation/Objective: Well sounding, NAD noted at this time.     Assessment/Plan: 1. Obstructive chronic bronchitis without exacerbation (HCC) Currently stable.  Continue use inhalers and medications as prescribed.  2. Nicotine dependence, cigarettes, uncomplicated Smoking cessation counseling: 1. Pt acknowledges the risks of long term smoking, she will try to quite smoking. 2. Options for different medications including nicotine products, chewing gum, patch etc, Wellbutrin and Chantix is discussed 3. Goal and date of compete cessation is discussed 4. Total time spent in smoking cessation is 15 min.  3. 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  Continue to follow-up with cardiology as planned.  General Counseling: Densil verbalizes understanding of the findings of today's phone visit and agrees with plan of treatment. I have discussed any further diagnostic evaluation that may be needed or ordered today. We also reviewed his medications today. he has been encouraged to call the office with any questions or concerns that should arise related to todays visit.    No orders of the defined types were placed in this encounter.   No orders of the defined types were placed in this encounter.   Time spent:14 Minutes    Orson Gear AGNP-C Internal medicine

## 2019-07-09 ENCOUNTER — Other Ambulatory Visit: Payer: Self-pay | Admitting: Adult Health

## 2019-07-09 MED ORDER — MONTELUKAST SODIUM 10 MG PO TABS: 10.0000 mg | ORAL_TABLET | Freq: Every day | ORAL | 2 refills | Status: DC

## 2019-07-10 ENCOUNTER — Other Ambulatory Visit: Payer: Self-pay | Admitting: Cardiovascular Disease

## 2019-07-10 DIAGNOSIS — E7800 Pure hypercholesterolemia, unspecified: Secondary | ICD-10-CM

## 2019-07-10 DIAGNOSIS — I2510 Atherosclerotic heart disease of native coronary artery without angina pectoris: Secondary | ICD-10-CM

## 2019-07-10 DIAGNOSIS — I10 Essential (primary) hypertension: Secondary | ICD-10-CM

## 2019-07-12 NOTE — Telephone Encounter (Signed)
°*  STAT* If patient is at the pharmacy, call can be transferred to refill team.   1. Which medications need to be refilled? (please list name of each medication and dose if known)  Rosuvastatin and Valsartan- enough until his appt on 07-30-19  2. Which pharmacy/location (including street and city if local pharmacy) is medication to be sent to? [place], [place]  3. Do they need a 30 day or 90 day supply?  Enough until 07-30-19

## 2019-07-29 ENCOUNTER — Encounter: Payer: Self-pay | Admitting: Physician Assistant

## 2019-07-29 ENCOUNTER — Telehealth: Payer: Self-pay | Admitting: *Deleted

## 2019-07-29 NOTE — Progress Notes (Signed)
Virtual Visit via Telephone Note   This visit type was conducted due to national recommendations for restrictions regarding the COVID-19 Pandemic (e.g. social distancing) in an effort to limit this patient's exposure and mitigate transmission in our community.  Due to his co-morbid illnesses, this patient is at least at moderate risk for complications without adequate follow up.  This format is felt to be most appropriate for this patient at this time.  The patient did not have access to video technology/had technical difficulties with video requiring transitioning to audio format only (telephone).  All issues noted in this document were discussed and addressed.  No physical exam could be performed with this format.  Please refer to the patient's chart for his  consent to telehealth for [facility].   Date:  07/30/2019   ID:  Matthew, Booth [birth year], MRN [id number]  Patient Location: Home Provider Location: Home  PCP:  Barbette Reichmann, MD  Cardiologist:  Verne Carrow, MD  Electrophysiologist:  None   Evaluation Performed:  Follow-Up Visit  Chief Complaint:  F/u CAD (last OV 02/2018)  History of Present Illness:    [name] is a 73 y.o. male with CAD s/p RCA stent placed in 1999 and heart cath 01/11/15 with stable disease, tobacco use, HTN, HLD, COPD who presents for cardiac follow-up virtually in setting of Covid-19 pandemic.  He is a Patent attorney. He is a former pt of Dr. Deborah Chalk, but more recently Dr. Clifton James. His heart history dates back to 1999 when he had a stent placed in the RCA. Stress myoview in 12/02/12 showed no ischemia. Last cardiac cath 01/2018 showed patent stent in the mid RCA, mild distal LM (40%), moderate stenosis in small-moderate caliber diagonal branch, mild plaque in Cx and LAD, normal LV systolic function, EF 55-65%. He was also having some throat pain, weight loss and nightsweats so was seeing additional specialists but fortunately nothing  ever panned out with updated cancer screening and symptoms resolved. Last labs 11/2018 showed Hgb 11.8, Plt 157, K 3.6, Cr 0.97, 03/2018 ALT wnl, 02/2018 LDL 40.  He is seen virtually for follow-up today and indicates he's doing great from a cardiac standpoint. 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 He denies any angina, dyspnea, diaphoresis, dizziness, palpitations, edema, orthopnea. He says he feels tired sometimes but attributes this to his age and smoking - he does not think it is unusual. He has cut down to 1/2 ppd but is not yet in the mindset where he's ready to quit despite our urging.  The patient does not have symptoms concerning for COVID-19 infection (fever, chills, cough, or new shortness of breath).   Past Medical History:  Diagnosis Date   Anemia    Back pain    scoliosis   CAD (coronary artery disease)    a. stent to RCA 1999.b. Last cath 01/2018 stable -> medical therapy.   COPD (chronic obstructive pulmonary disease) (HCC)    Depression    Diverticulosis    GI bleed    History of colon polyps    History of hiatal hernia    Hyperkalemia    Hyperlipidemia    Hypertension    Scoliosis    Tobacco abuse    Past Surgical History:  Procedure Laterality Date   APPENDECTOMY  1957   BACK SURGERY     CARDIAC CATHETERIZATION  1999   stent  placement RCA   CARDIAC CATHETERIZATION  2010   mild left CA distal stenosis, mild irregularities in RCA and LAD. patent setn in RCA   COLONOSCOPY WITH PROPOFOL N/A 06/16/2018   Procedure: COLONOSCOPY WITH PROPOFOL;  Surgeon: Toledo, Benay Pike, MD;  Location: [facility] ENDOSCOPY;  Service: Gastroenterology;  Laterality: N/A;   CORONARY ARTERY BYPASS GRAFT     ESOPHAGOGASTRODUODENOSCOPY (EGD) WITH PROPOFOL N/A 06/16/2018   Procedure: ESOPHAGOGASTRODUODENOSCOPY (EGD) WITH PROPOFOL;  Surgeon: Toledo, Benay Pike, MD;  Location: [facility] ENDOSCOPY;   Service: Gastroenterology;  Laterality: N/A;   FOOT SURGERY  1973   left   LEFT HEART CATH AND CORONARY ANGIOGRAPHY N/A 01/22/2018   Procedure: LEFT HEART CATH AND CORONARY ANGIOGRAPHY;  Surgeon: Burnell Blanks, MD;  Location: [facility] CV LAB;  Service: Cardiovascular;  Laterality: N/A;   LEFT HEART CATHETERIZATION WITH CORONARY ANGIOGRAM N/A 01/11/2015   Procedure: LEFT HEART CATHETERIZATION WITH CORONARY ANGIOGRAM;  Surgeon: Burnell Blanks, MD;  Location: [facility] CATH LAB;  Service: Cardiovascular;  Laterality: N/A;     Current Meds  Medication Sig   albuterol (PROVENTIL HFA;VENTOLIN HFA) 108 (90 Base) MCG/ACT inhaler Inhale 2 puffs into the lungs every 6 (six) hours as needed for wheezing or shortness of breath.   albuterol (PROVENTIL HFA;VENTOLIN HFA) 108 (90 Base) MCG/ACT inhaler Inhale 2 puffs into the lungs every 6 (six) hours as needed for wheezing or shortness of breath.   aspirin EC 81 MG tablet Take 1 tablet (81 mg total) by mouth daily.   buPROPion (WELLBUTRIN XL) 150 MG 24 hr tablet Take 150 mg by mouth at bedtime.   cyanocobalamin 500 MCG tablet Take 500 mcg by mouth 2 (two) times daily. VITAMIN B-12   escitalopram (LEXAPRO) 20 MG tablet Take 1 tablet (20 mg total) by mouth daily.   esomeprazole (NEXIUM) 40 MG capsule Take 1 capsule (40 mg total) by mouth daily.   gabapentin (NEURONTIN) 400 MG capsule Take 400 mg by mouth 3 (three) times daily.   ibuprofen (ADVIL) 200 MG tablet Take 800 mg by mouth every 6 (six) hours as needed.   montelukast (SINGULAIR) 10 MG tablet Take 1 tablet (10 mg total) by mouth at bedtime.   morphine (MS CONTIN) 15 MG 12 hr tablet Take 15 mg by mouth 2 (two) times daily.    oxyCODONE-acetaminophen (PERCOCET) 5-325 MG per tablet Take 1 tablet by mouth every 4 (four) hours as needed. For pain   rosuvastatin (CRESTOR) 40 MG tablet Take 1 tablet (40 mg total) by mouth daily.   SPIRIVA HANDIHALER 18 MCG inhalation capsule  INHALE THE CONTENTS OF 1 CAPSULE AT BEDTIME AS DIRECTED PER PACKAGE INSTRUCTIONS   valsartan (DIOVAN) 160 MG tablet Take 1 tablet (160 mg total) by mouth daily.   Vitamin D, Ergocalciferol, (DRISDOL) 50000 UNITS CAPS capsule Take 50,000 Units by mouth every Wednesday.    [DISCONTINUED] acetaminophen (TYLENOL) 500 MG tablet Take 1,000 mg by mouth every 12 (twelve) hours as needed for mild pain, fever or headache (sleep).    [DISCONTINUED] ibuprofen (ADVIL,MOTRIN) 800 MG tablet Take 800 mg by mouth every 8 (eight) hours as needed (FOR PAIN.).   [DISCONTINUED] rosuvastatin (CRESTOR) 40 MG tablet TAKE 1 TABLET DAILY   [DISCONTINUED] valsartan (DIOVAN) 160 MG tablet Take 1 tablet (160 mg total) by mouth daily. Please make overdue appt with Dr. Angelena Form before anymore refills. 2nd attempt     Allergies:   Levofloxacin   Social History   Tobacco Use   Smoking status: Current Every  Day Smoker    Packs/day: 1.00    Years: 35.00    Pack years: 35.00    Types: Cigarettes   Smokeless tobacco: Never Used  Substance Use Topics   Alcohol use: Yes    Alcohol/week: 7.0 standard drinks    Types: 7 Standard drinks or equivalent per week   Drug use: No     Family Hx: The patient's family history includes Alzheimer's disease in his father; Angina in his father; Diabetes in his cousin; Heart attack in his cousin; Hyperlipidemia in his cousin; Hypertension in his cousin.  ROS:   Please see the history of present illness.    All other systems reviewed and are negative.   Prior CV studies:    Most recent pertinent cardiac studies are outlined above. Cardiac Cath 01/2018 Conclusion   Prox RCA to Mid RCA lesion is 40% stenosed.  Dist RCA lesion is 20% stenosed.  Post Atrio lesion is 40% stenosed.  Ost RPDA lesion is 40% stenosed.  Ost Cx to Prox Cx lesion is 30% stenosed.  Mid LM to Dist LM lesion is 40% stenosed.  Ost 1st Diag lesion is 60% stenosed.  The left ventricular  systolic function is normal.  LV end diastolic pressure is normal.  The left ventricular ejection fraction is 55-65% by visual estimate.  There is no mitral valve regurgitation.   1. Patent stent mid RCA with minimal restenosis, unchanged 2. Mild distal left main stenosis, unchanged 3. Moderate stenosis in the small to moderate caliber Diagonal branch, unchanged 4. Mild plaque in the Circumflex and LAD 5. Normal LV systolic function  Recommendations: His CAD is stable. I do not think his dyspnea is related to his CAD. He will continue workup for possible malignancy. He will need to see his pulmonary specialist.      Labs/Other Tests and Data Reviewed:    EKG:  An ECG dated 12/09/18 was personally reviewed today and demonstrated:  NSR 97bpm, nonspecific STT changes  Recent Labs: 12/09/2018: BUN 18; Creatinine, Ser 0.97; Hemoglobin 11.8; Platelets 157; Potassium 3.6; Sodium 135   Recent Lipid Panel Lab Results  Component Value Date/Time   CHOL 123 02/25/2018 11:51 AM   TRIG 64 02/25/2018 11:51 AM   HDL 70 02/25/2018 11:51 AM   CHOLHDL 1.8 02/25/2018 11:51 AM   CHOLHDL 4 01/09/2015 10:22 AM   LDLCALC 40 02/25/2018 11:51 AM   LDLDIRECT 74.5 11/19/2013 11:07 AM    Wt Readings from Last 3 Encounters:  07/30/19 160 lb (72.6 kg)  07/08/19 160 lb (72.6 kg)  12/09/18 158 lb 15.2 oz (72.1 kg)     Objective:    Vital Signs:  BP 128/76    Pulse 74    Ht 5\' 8"  (1.727 m)    Wt 160 lb (72.6 kg)    BMI 24.33 kg/m    VS reviewed. General - pleasant M in no acute distress Pulm - No labored breathing, no coughing during visit, no audible wheezing, speaking in full sentences Neuro - A+Ox3, no slurred speech, answers questions appropriately Psych - Pleasant affect     ASSESSMENT & PLAN:    1. CAD - doing well without recurrent angina. Continue ASA, statin. He will notify for any new symptoms. 2. Essential HTN - well controlled. Labs 11/2018 were stable. Refill valsartan  today. 3. Hyperlipidemia - he is due for lipids/LFTs. However, he is near certain the 12/2018 did these in 09/2018. I reminded him that December of this year is coming up soon  so he would be due for repeat anyway. [facility] and if they are planning on repeating at his CPE there in December of this year. He will let us know if there is any delay in getting his labs updated at which time we will arrange for him to have labs in our office. Refill statin today to avoid lapse in care. 4. Tobacco use - counseled on cessation. He's not ready to quit but still contemplating.  COVID-19 Education: The signs and symptoms of COVID-19 were discussed with the patient and how to seek care for testing (follow up with PCP or arrange E-visit).  The importance of social distancing was discussed today.  Time:   Today, I have spent 18 minutes with the patient with telehealth technology discussing the above problems. 8 minutes spent reviewing records.   Medication Adjustments/Labs and Tests Ordered: Current medicines are reviewed at length with the patient today.  Concerns regarding medicines are outlined above.   Disposition:  Follow up in 6 months in the office with Dr. Clifton JamesMcAlhany.  Signed, Laurann Montanaayna N Thong Feeny, PA-C  07/30/2019 12:00 PM    [facility]

## 2019-07-29 NOTE — Telephone Encounter (Signed)
Call placed to pt re: appt, 07/30/2019, virtually. Need to make sure pt is aware it is virtual, need to see if it is phone or video and need to get consent on file. Left a message for him to call back.

## 2019-07-30 ENCOUNTER — Telehealth (INDEPENDENT_AMBULATORY_CARE_PROVIDER_SITE_OTHER): Payer: Medicare Other | Admitting: Physician Assistant

## 2019-07-30 ENCOUNTER — Other Ambulatory Visit: Payer: Self-pay

## 2019-07-30 ENCOUNTER — Encounter: Payer: Self-pay | Admitting: Physician Assistant

## 2019-07-30 VITALS — BP 128/76 | HR 74 | Ht 68.0 in | Wt 160.0 lb

## 2019-07-30 DIAGNOSIS — Z720 Tobacco use: Secondary | ICD-10-CM

## 2019-07-30 DIAGNOSIS — I2510 Atherosclerotic heart disease of native coronary artery without angina pectoris: Secondary | ICD-10-CM | POA: Diagnosis not present

## 2019-07-30 DIAGNOSIS — E7800 Pure hypercholesterolemia, unspecified: Secondary | ICD-10-CM

## 2019-07-30 DIAGNOSIS — I10 Essential (primary) hypertension: Secondary | ICD-10-CM

## 2019-07-30 DIAGNOSIS — E785 Hyperlipidemia, unspecified: Secondary | ICD-10-CM

## 2019-07-30 MED ORDER — ROSUVASTATIN CALCIUM 40 MG PO TABS: 40.0000 mg | ORAL_TABLET | Freq: Every day | ORAL | 3 refills | Status: DC

## 2019-07-30 MED ORDER — VALSARTAN 160 MG PO TABS: 160.0000 mg | ORAL_TABLET | Freq: Every day | ORAL | 3 refills | Status: DC

## 2019-07-30 NOTE — Patient Instructions (Signed)
Medication Instructions:  Your physician recommends that you continue on your current medications as directed. Please refer to the Current Medication list given to you today.  *If you need a refill on your cardiac medications before your next appointment, please call your pharmacy*  Lab Work: None ordered You are due for repeat labwork for your cholesterol and liver function. You planned to call the [facility] to find out if they had done this in December - if they did, please have them send [place] a copy of their bloodwork to [phone]. If they did not and you won't be seeing them soon, you'll need to let us know so that we can arrange follow-up labs in our office.   If you have labs (blood work) drawn today and your tests are completely normal, you will receive your results only by: Marland Kitchen MyChart Message (if you have MyChart) OR . A paper copy in the mail If you have any lab test that is abnormal or we need to change your treatment, we will call you to review the results.  Testing/Procedures: None ordered  Follow-Up: At [facility], you and your health needs are our priority.  As part of our continuing mission to provide you with exceptional heart care, we have created designated Provider Care Teams.  These Care Teams include your primary Cardiologist (physician) and Advanced Practice Providers (APPs -  Physician Assistants and Nurse Practitioners) who all work together to provide you with the care you need, when you need it.  Your next appointment:   6 months  The format for your next appointment:   In Person  Provider:   You may see Lauree Chandler, MD or one of the following Advanced Practice Providers on your designated Care Team:    Melina Copa, PA-C  Ermalinda Barrios, PA-C   Other Instructions

## 2019-07-30 NOTE — Telephone Encounter (Signed)

## 2019-08-10 ENCOUNTER — Other Ambulatory Visit: Payer: Self-pay | Admitting: Cardiovascular Disease

## 2019-08-10 DIAGNOSIS — I2510 Atherosclerotic heart disease of native coronary artery without angina pectoris: Secondary | ICD-10-CM

## 2019-08-10 DIAGNOSIS — I10 Essential (primary) hypertension: Secondary | ICD-10-CM

## 2019-08-10 DIAGNOSIS — E7800 Pure hypercholesterolemia, unspecified: Secondary | ICD-10-CM

## 2019-08-10 MED ORDER — ROSUVASTATIN CALCIUM 40 MG PO TABS: 40.0000 mg | ORAL_TABLET | Freq: Every day | ORAL | 3 refills | Status: DC

## 2019-08-10 MED ORDER — VALSARTAN 160 MG PO TABS: 160.0000 mg | ORAL_TABLET | Freq: Every day | ORAL | 3 refills | Status: DC

## 2019-08-17 ENCOUNTER — Other Ambulatory Visit: Payer: Self-pay | Admitting: Sports Medicine

## 2019-08-17 ENCOUNTER — Telehealth: Payer: Self-pay | Admitting: *Deleted

## 2019-08-17 DIAGNOSIS — M25521 Pain in right elbow: Secondary | ICD-10-CM

## 2019-08-17 DIAGNOSIS — M7021 Olecranon bursitis, right elbow: Secondary | ICD-10-CM

## 2019-08-17 DIAGNOSIS — S46311A Strain of muscle, fascia and tendon of triceps, right arm, initial encounter: Secondary | ICD-10-CM

## 2019-08-17 DIAGNOSIS — W19XXXA Unspecified fall, initial encounter: Secondary | ICD-10-CM

## 2019-08-31 ENCOUNTER — Ambulatory Visit
Admission: RE | Admit: 2019-08-31 | Discharge: 2019-08-31 | Disposition: A | Discharge status: Home / Self Care | Payer: Medicare Other | Source: Ambulatory Visit | Attending: Sports Medicine | Admitting: Sports Medicine

## 2019-08-31 ENCOUNTER — Other Ambulatory Visit: Payer: Self-pay

## 2019-08-31 DIAGNOSIS — W19XXXA Unspecified fall, initial encounter: Secondary | ICD-10-CM

## 2019-08-31 DIAGNOSIS — S46311A Strain of muscle, fascia and tendon of triceps, right arm, initial encounter: Secondary | ICD-10-CM | POA: Diagnosis present

## 2019-08-31 DIAGNOSIS — M7021 Olecranon bursitis, right elbow: Secondary | ICD-10-CM | POA: Diagnosis present

## 2019-08-31 DIAGNOSIS — S46319A Strain of muscle, fascia and tendon of triceps, unspecified arm, initial encounter: Secondary | ICD-10-CM | POA: Diagnosis not present

## 2019-08-31 DIAGNOSIS — M25521 Pain in right elbow: Secondary | ICD-10-CM | POA: Diagnosis not present

## 2019-09-28 ENCOUNTER — Telehealth: Payer: Self-pay | Admitting: Cardiovascular Disease

## 2019-09-28 NOTE — Telephone Encounter (Signed)
Rosey from [employer] is calling to confirm that we have received the cardiac clearance fax. She is needing this filled out ASAP.

## 2019-09-28 NOTE — Telephone Encounter (Signed)
I returned call to [place] in regards to clearance form if received or not. I had stated to [facility] that we have not yet received the clearance and if they would re-fax the clearance form to our office. Jenny Reichmann said she will let Rosey, surgery scheduler know.

## 2019-09-28 NOTE — Telephone Encounter (Signed)
       [facility] Pre-operative Risk Assessment    Request for surgical clearance:  1. What type of surgery is being performed? RIGHT DISTAL BICEP REPAIR  2. When is this surgery scheduled? 10/04/19  3. What type of clearance is required (medical clearance vs. Pharmacy clearance to hold med vs. Both)? BOTH  4. Are there any medications that need to be held prior to surgery and how long?  5. Practice name and name of physician performing surgery? [facility], DR PATEL  6. What is your office phone number [phone]   7.   What is your office fax number [phone]  8.   Anesthesia type (None, local, MAC, general) ? GENERAL   Laurier Nancy 09/28/2019, 3:08 PM  _________________________________________________________________   (provider comments below)

## 2019-09-28 NOTE — Telephone Encounter (Signed)
   Primary Cardiologist: Lauree Chandler, MD  Chart reviewed as part of pre-operative protocol coverage. He has a history of CAD with most recent cardiac cath on 01/2018 with patent stent in the mid RCA, mild distal LM (40%), moderate stenosis in small-moderate caliber diagonal branch, mild plaque in Cx and LAD, normal LV systolic function, EF 28-78%. He is only on ASA 81 mg and not on any antiplatelet or anticoagulation therapy.  His last cardiology encounter was with Melina Copa, PA on 07/30/2019. He was stable from CV standpoint. I have spoken to the patient by phone today and he continues to be without cardiac complaints, is medically compliant and remains active.   Given past medical history and time since last visit, based on ACC/AHA guidelines, [name] would be at acceptable risk for the planned procedure without further cardiovascular testing. He does not need to hold ASA 81 mg from cardiac standpoint but can stop it at the discretion of Dr. Posey Pronto. Begin again ASAP after the procedure.   I will route this recommendation to the requesting party via Epic fax function and remove from pre-op pool.  Please call with questions.  Phill Myron. West Pugh, ANP, AACC  09/28/2019, 3:26 PM

## 2019-09-28 NOTE — Telephone Encounter (Signed)
CORRECTION: Operator listed this is clearance needed by both Pharm and Medical. I see that pt is only on ASA, as this would be a medical clearance only.

## 2019-09-29 ENCOUNTER — Other Ambulatory Visit: Payer: Self-pay | Admitting: Orthopedic Surgery

## 2019-09-30 ENCOUNTER — Encounter
Admission: RE | Admit: 2019-09-30 | Discharge: 2019-09-30 | Disposition: A | Discharge status: Home / Self Care | Payer: Medicare Other | Source: Ambulatory Visit | Attending: Orthopedic Surgery | Admitting: Orthopedic Surgery

## 2019-09-30 ENCOUNTER — Other Ambulatory Visit: Payer: Self-pay

## 2019-09-30 ENCOUNTER — Encounter: Payer: Self-pay | Admitting: Internal Medicine

## 2019-09-30 ENCOUNTER — Ambulatory Visit (INDEPENDENT_AMBULATORY_CARE_PROVIDER_SITE_OTHER): Payer: Medicare Other | Admitting: Internal Medicine

## 2019-09-30 ENCOUNTER — Ambulatory Visit
Admission: RE | Admit: 2019-09-30 | Discharge: 2019-09-30 | Disposition: A | Discharge status: Home / Self Care | Payer: Medicare Other | Source: Ambulatory Visit | Attending: Adult Health | Admitting: Adult Health

## 2019-09-30 VITALS — BP 134/76 | HR 75 | Temp 96.7°F | Resp 16 | Ht 68.0 in | Wt 158.0 lb

## 2019-09-30 DIAGNOSIS — I10 Essential (primary) hypertension: Secondary | ICD-10-CM | POA: Diagnosis not present

## 2019-09-30 DIAGNOSIS — J849 Interstitial pulmonary disease, unspecified: Secondary | ICD-10-CM | POA: Diagnosis not present

## 2019-09-30 DIAGNOSIS — Z20828 Contact with and (suspected) exposure to other viral communicable diseases: Secondary | ICD-10-CM | POA: Diagnosis not present

## 2019-09-30 DIAGNOSIS — Z01818 Encounter for other preprocedural examination: Secondary | ICD-10-CM | POA: Insufficient documentation

## 2019-09-30 DIAGNOSIS — R062 Wheezing: Secondary | ICD-10-CM | POA: Diagnosis not present

## 2019-09-30 DIAGNOSIS — J449 Chronic obstructive pulmonary disease, unspecified: Secondary | ICD-10-CM

## 2019-09-30 DIAGNOSIS — R05 Cough: Secondary | ICD-10-CM | POA: Insufficient documentation

## 2019-09-30 DIAGNOSIS — R0602 Shortness of breath: Secondary | ICD-10-CM

## 2019-09-30 DIAGNOSIS — F17210 Nicotine dependence, cigarettes, uncomplicated: Secondary | ICD-10-CM

## 2019-09-30 DIAGNOSIS — R059 Cough, unspecified: Secondary | ICD-10-CM

## 2019-09-30 DIAGNOSIS — I2510 Atherosclerotic heart disease of native coronary artery without angina pectoris: Secondary | ICD-10-CM

## 2019-09-30 HISTORY — DX: Unspecified osteoarthritis, unspecified site: M19.90

## 2019-09-30 HISTORY — DX: Gastro-esophageal reflux disease without esophagitis: K21.9

## 2019-09-30 LAB — BASIC METABOLIC PANEL
Anion gap: 9 (ref 5–15)
BUN: 14 mg/dL (ref 8–23)
CO2: 25 mmol/L (ref 22–32)
Calcium: 9.4 mg/dL (ref 8.9–10.3)
Chloride: 105 mmol/L (ref 98–111)
Creatinine, Ser: 0.86 mg/dL (ref 0.61–1.24)
GFR calc Af Amer: >60 mL/min (ref >60)
GFR calc non Af Amer: >60 mL/min (ref >60)
Glucose, Bld: 100 mg/dL — ABNORMAL HIGH (ref 70–99)
Potassium: 4.3 mmol/L (ref 3.5–5.1)
Sodium: 139 mmol/L (ref 135–145)

## 2019-09-30 LAB — CBC
HCT: 36.3 % — ABNORMAL LOW (ref 39.0–52.0)
Hemoglobin: 11.9 g/dL — ABNORMAL LOW (ref 13.0–17.0)
MCH: 31.4 pg (ref 26.0–34.0)
MCHC: 32.8 g/dL (ref 30.0–36.0)
MCV: 95.8 fL (ref 80.0–100.0)
Platelets: 186 10*3/uL (ref 150–400)
RBC: 3.79 MIL/uL — ABNORMAL LOW (ref 4.22–5.81)
RDW: 13.5 % (ref 11.5–15.5)
WBC: 5.5 10*3/uL (ref 4.0–10.5)
nRBC: 0 % (ref 0.0–0.2)

## 2019-09-30 LAB — SARS CORONAVIRUS 2 (TAT 6-24 HRS): SARS Coronavirus 2: NEGATIVE

## 2019-09-30 MED ORDER — SULFAMETHOXAZOLE-TRIMETHOPRIM 400-80 MG PO TABS: 1.0000 | ORAL_TABLET | Freq: Two times a day (BID) | ORAL | 0 refills | Status: DC

## 2019-09-30 NOTE — Progress Notes (Signed)
[facility] [place],  [address]  Pulmonary Sleep Medicine   Office Visit Note  Patient Name: [name] DOB: 1945/11/30 MRN [id number]  Date of Service: 09/30/2019  Complaints/HPI: Pt is here for pulmonary clearance. He reports he fell last month, and now much has a distal biceps repair of his right arm.  He denies any other issues.  His breathing has been at baseline, although he does have some congestion and cough today.      ROS  General: (-) fever, (-) chills, (-) night sweats, (-) weakness Skin: (-) rashes, (-) itching,. Eyes: (-) visual changes, (-) redness, (-) itching. Nose and Sinuses: (-) nasal stuffiness or itchiness, (-) postnasal drip, (-) nosebleeds, (-) sinus trouble. Mouth and Throat: (-) sore throat, (-) hoarseness. Neck: (-) swollen glands, (-) enlarged thyroid, (-) neck pain. Respiratory: + cough, (-) bloody sputum, - shortness of breath, - wheezing. Cardiovascular: - ankle swelling, (-) chest pain. Lymphatic: (-) lymph node enlargement. Neurologic: (-) numbness, (-) tingling. Psychiatric: (-) anxiety, (-) depression   Current Medication: Outpatient Encounter Medications as of 09/30/2019  Medication Sig  . acetaminophen (TYLENOL) 500 MG tablet Take 1,000 mg by mouth 2 (two) times daily as needed for moderate pain or headache.  . albuterol (PROVENTIL HFA;VENTOLIN HFA) 108 (90 Base) MCG/ACT inhaler Inhale 2 puffs into the lungs every 6 (six) hours as needed for wheezing or shortness of breath.  Marland Kitchen aspirin EC 81 MG tablet Take 1 tablet (81 mg total) by mouth daily.  . cyanocobalamin 500 MCG tablet Take 500 mcg by mouth 2 (two) times daily. VITAMIN B-12  . docusate sodium (COLACE) 50 MG capsule Take 50 mg by mouth 2 (two) times daily.  Marland Kitchen escitalopram (LEXAPRO) 20 MG tablet Take 1 tablet (20 mg total) by mouth daily.  Marland Kitchen esomeprazole (NEXIUM) 20 MG capsule Take 40 mg by mouth daily at 12 noon.  Marland Kitchen esomeprazole (NEXIUM) 40 MG  capsule Take 1 capsule (40 mg total) by mouth daily. (Patient not taking: Reported on 09/29/2019)  . gabapentin (NEURONTIN) 400 MG capsule Take 400 mg by mouth at bedtime.   Marland Kitchen ibuprofen (ADVIL) 800 MG tablet Take 800 mg by mouth every 8 (eight) hours as needed for moderate pain.   . montelukast (SINGULAIR) 10 MG tablet Take 1 tablet (10 mg total) by mouth at bedtime.  Marland Kitchen morphine (MS CONTIN) 15 MG 12 hr tablet Take 15 mg by mouth 2 (two) times daily.   Marland Kitchen oxyCODONE-acetaminophen (PERCOCET) 5-325 MG per tablet Take 1 tablet by mouth every 4 (four) hours as needed for moderate pain. For pain  . promethazine (PHENERGAN) 25 MG tablet Take 25 mg by mouth 2 (two) times daily.  . rosuvastatin (CRESTOR) 40 MG tablet Take 1 tablet (40 mg total) by mouth daily.  Marland Kitchen SPIRIVA HANDIHALER 18 MCG inhalation capsule INHALE THE CONTENTS OF 1 CAPSULE AT BEDTIME AS DIRECTED PER PACKAGE INSTRUCTIONS (Patient not taking: No sig reported)  . valsartan (DIOVAN) 160 MG tablet Take 1 tablet (160 mg total) by mouth daily.  . Vitamin D, Ergocalciferol, (DRISDOL) 50000 UNITS CAPS capsule Take 50,000 Units by mouth every Wednesday.    No facility-administered encounter medications on file as of 09/30/2019.    Surgical History: Past Surgical History:  Procedure Laterality Date  . APPENDECTOMY  1957  . BACK SURGERY    . CARDIAC CATHETERIZATION  1999   stent placement RCA  . CARDIAC CATHETERIZATION  2010   mild left CA distal stenosis, mild  irregularities in RCA and LAD. patent setn in RCA  . COLONOSCOPY WITH PROPOFOL N/A 06/16/2018   Procedure: COLONOSCOPY WITH PROPOFOL;  Surgeon: Toledo, Boykin Nearingeodoro K, MD;  Location: [facility] ENDOSCOPY;  Service: Gastroenterology;  Laterality: N/A;  . CORONARY ANGIOPLASTY    . CORONARY ARTERY BYPASS GRAFT     denies 09/30/19  . ESOPHAGOGASTRODUODENOSCOPY (EGD) WITH PROPOFOL N/A 06/16/2018   Procedure: ESOPHAGOGASTRODUODENOSCOPY (EGD) WITH PROPOFOL;  Surgeon: Toledo, Boykin Nearingeodoro K, MD;  Location: [facility]  ENDOSCOPY;  Service: Gastroenterology;  Laterality: N/A;  . FOOT SURGERY  1973   left  . LEFT HEART CATH AND CORONARY ANGIOGRAPHY N/A 01/22/2018   Procedure: LEFT HEART CATH AND CORONARY ANGIOGRAPHY;  Surgeon: Kathleene HazelMcAlhany, Christopher D, MD;  Location: [facility] INVASIVE CV LAB;  Service: Cardiovascular;  Laterality: N/A;  . LEFT HEART CATHETERIZATION WITH CORONARY ANGIOGRAM N/A 01/11/2015   Procedure: LEFT HEART CATHETERIZATION WITH CORONARY ANGIOGRAM;  Surgeon: Kathleene Hazelhristopher D McAlhany, MD;  [facility];  Service: Cardiovascular;  Laterality: N/A;    Medical History: Past Medical History:  Diagnosis Date  . Anemia   . Arthritis   . Back pain    scoliosis  . CAD (coronary artery disease)    a. stent to RCA 1999.b. Last cath 01/2018 stable -> medical therapy.  Marland Kitchen. COPD (chronic obstructive pulmonary disease) (HCC)   . Depression   . Diverticulosis   . GERD (gastroesophageal reflux disease)   . GI bleed   . History of colon polyps   . History of hiatal hernia   . Hyperkalemia   . Hyperlipidemia   . Hypertension   . Scoliosis   . Tobacco abuse     Family History: Family History  Problem Relation Age of Onset  . Alzheimer's disease Father   . Angina Father   . Diabetes Cousin        mothers side  . Heart attack Cousin        fathers side  . Hyperlipidemia Cousin        fathers side  . Hypertension Cousin        fathers side    Social History: Social History   Socioeconomic History  . Marital status: Divorced    Spouse name: Not on file  . Number of children: 3  . Years of education: Not on file  . Highest education level: Not on file  Occupational History    Employer: RETIRED  Tobacco Use  . Smoking status: Current Every Day Smoker    Packs/day: 0.50    Years: 35.00    Pack years: 17.50    Types: Cigarettes  . Smokeless tobacco: Never Used  Substance and Sexual Activity  . Alcohol use: Yes    Alcohol/week: 6.0 standard drinks    Types: 6 Cans of beer per week   . Drug use: No  . Sexual activity: Not on file  Other Topics Concern  . Not on file  Social History Narrative  . Not on file   Social Determinants of Health   Financial Resource Strain:   . Difficulty of Paying Living Expenses: Not on file  Food Insecurity:   . Worried About Programme researcher, broadcasting/film/videounning Out of Food in the Last Year: Not on file  . Ran Out of Food in the Last Year: Not on file  Transportation Needs:   . Lack of Transportation (Medical): Not on file  . Lack of Transportation (Non-Medical): Not on file  Physical Activity:   . Days of Exercise per Week: Not on file  .  Minutes of Exercise per Session: Not on file  Stress:   . Feeling of Stress : Not on file  Social Connections:   . Frequency of Communication with Friends and Family: Not on file  . Frequency of Social Gatherings with Friends and Family: Not on file  . Attends Religious Services: Not on file  . Active Member of Clubs or Organizations: Not on file  . Attends Banker Meetings: Not on file  . Marital Status: Not on file  Intimate Partner Violence:   . Fear of Current or Ex-Partner: Not on file  . Emotionally Abused: Not on file  . Physically Abused: Not on file  . Sexually Abused: Not on file    Vital Signs: Blood pressure 134/76, pulse 75, temperature (!) 96.7 F (35.9 C), resp. rate 16, height  (1.727 m), weight 158 lb (71.7 kg), SpO2 96 %.  Examination: General Appearance: The patient is well-developed, well-nourished, and in no distress. Skin: Gross inspection of skin unremarkable. Head: normocephalic, no gross deformities. Eyes: no gross deformities noted. ENT: ears appear grossly normal no exudates. Neck: Supple. No thyromegaly. No LAD. Respiratory: clear bilaterally. Cardiovascular: Normal S1 and S2 without murmur or rub. Extremities: No cyanosis. pulses are equal. Neurologic: Alert and oriented. No involuntary movements.  LABS: No results found for this or any previous visit (from  the past 2160 hour(s)).  Radiology: No results found.  No results found.  MR ELBOW RIGHT WO CONTRAST  Result Date: 09/01/2019 CLINICAL DATA:  Right elbow pain, swelling and tenderness after falling 1 week ago. EXAM: MRI OF THE RIGHT ELBOW WITHOUT CONTRAST TECHNIQUE: Multiplanar, multisequence MR imaging of the elbow was performed. No intravenous contrast was administered. COMPARISON:  Report only from outside elbow radiographs 08/02/2019 (unavailable for direct comparison). FINDINGS: TENDONS Common forearm flexor origin: Intact with normal signal. Common forearm extensor origin: Mild tendinosis without tear. Biceps: Intact. Triceps: The triceps tendon is completely ruptured and moderately retracted by approximately 2.2 cm on sagittal image 14/8. LIGAMENTS Medial stabilizers: Intact. Lateral stabilizers:  Not optimally visualized. Grossly intact. Cartilage: Mild radiocapitellar and ulnohumeral degenerative changes with chondral thinning and small osteophytes. No focal chondral defect. Joint: No joint effusion or loose body observed. Cubital tunnel: The ulnar nerve appears mildly enlarged with hyperintense signal. The nerve appears normally located. Bones: No acute or significant extra-articular osseous findings. Other: A moderate to large amount of fluid is present in the olecranon bursa. In addition, there is mild surrounding posterior subcutaneous edema. IMPRESSION: 1. Complete rupture of the triceps tendon with moderate retraction with associated olecranon bursitis. 2. Mild common extensor tendinosis without tear. The additional elbow tendons and ligaments appear intact. 3. Mildly enlarged ulnar nerve with hyperintense signal, suggesting ulnar neuritis. 4. Mild radiocapitellar and ulnohumeral degenerative changes. No acute osseous findings. Electronically Signed   By: Carey Bullocks M.D.   On: 09/01/2019 09:29      Assessment and Plan: Patient Active Problem List   Diagnosis Date Noted  .  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 Benign essential hypertension 02/13/2017  . Centrilobular emphysema (HCC) 02/13/2017  . Chronic bilateral low back pain with right-sided sciatica 02/13/2017  . Gastroesophageal reflux disease 02/13/2017  . Major depressive disorder, recurrent, in full remission (HCC) 02/13/2017  . CAD in native artery 03/24/2011  . Tobacco user 03/24/2011  . Hypercholesterolemia 03/24/2011   1. Obstructive chronic bronchitis without exacerbation (HCC) Pt cleared for distal bicep repair.  Pt does appear to have some bronchitis on CXR.  Will start course of bactrim.  He is cleared to have procedure, knowing that here are always inherent risk with anesthesia, and people who have underlying conditions like COPD. Risks vs Benefit of the procedure should be weighted by operating team and patient.    2. Cough - DG Chest 2 View; Future  3. Nicotine dependence, cigarettes, uncomplicated Smoking cessation counseling: 1. Pt acknowledges the risks of long term smoking, she will try to quite smoking. 2. Options for different medications including nicotine products, chewing gum, patch etc, Wellbutrin and Chantix is discussed 3. Goal and date of compete cessation is discussed 4. Total time spent in smoking cessation is 15 min.  4. SOB (shortness of breath) FEV1 is 1.8 Liters at this time, with 66% of pre-predictited value.  - Spirometry with Graph  General Counseling: I have discussed the findings of the evaluation and examination with [name].  I have also discussed any further diagnostic evaluation thatmay be needed or ordered today. Bobbye verbalizes understanding of the findings of todays visit. We also reviewed his medications today and discussed drug interactions and side effects including but not limited excessive drowsiness and altered mental states. We also discussed that there is always a risk not just to him but also  people around him. he has been encouraged to call the office with any questions or concerns that should arise related to todays visit.  Orders Placed This Encounter  Procedures  . Spirometry with Graph    Order Specific Question:   Where should this test be performed?    Answer:   [facility]    Order Specific Question:   Basic spirometry    Answer:   Yes     Time spent: 20 This patient was seen by Blima Ledger AGNP-C in Collaboration with Dr. Freda Munro as a part of collaborative care agreement.   I have personally obtained a history, examined the patient, evaluated laboratory and imaging results, formulated the assessment and plan and placed orders.    Yevonne Pax, MD [facility] Pulmonary and Critical Care Sleep medicine

## 2019-09-30 NOTE — Telephone Encounter (Signed)
Re-sending clearance.

## 2019-09-30 NOTE — Patient Instructions (Signed)
Your procedure is scheduled on: Monday 10/04/19 Report to DAY SURGERY DEPARTMENT LOCATED ON 2ND FLOOR MEDICAL MALL ENTRANCE. To find out your arrival time please call [phone] between 1PM - 3PM on Friday 10/01/19.  Remember: Instructions that are not followed completely may result in serious medical risk, up to and including death, or upon the discretion of your surgeon and anesthesiologist your surgery may need to be rescheduled.     _X__ 1. Do not eat food after midnight the night before your procedure.                 No gum chewing or hard candies. You may drink clear liquids up to 2 hours                 before you are scheduled to arrive for your surgery- DO not drink clear                 liquids within 2 hours of the start of your surgery.                 Clear Liquids include:  water, apple juice without pulp, clear carbohydrate                 drink such as Clearfast or Gatorade, Black Coffee or Tea (Do not add                 anything to coffee or tea). Diabetics water only  __X__2.  On the morning of surgery brush your teeth with toothpaste and water, you                 may rinse your mouth with mouthwash if you wish.  Do not swallow any              toothpaste of mouthwash.     _X__ 3.  No Alcohol for 24 hours before or after surgery.   _X__ 4.  Do Not Smoke or use e-cigarettes For 24 Hours Prior to Your Surgery.                 Do not use any chewable tobacco products for at least 6 hours prior to                 surgery.  ____  5.  Bring all medications with you on the day of surgery if instructed.   __X__  6.  Notify your doctor if there is any change in your medical condition      (cold, fever, infections).     Do not wear jewelry, make-up, hairpins, clips or nail polish. Do not wear lotions, powders, or perfumes.  Do not shave 48 hours prior to surgery. Men may shave face and neck. Do not bring valuables to the hospital.    [facility] is not responsible for  any belongings or valuables.  Contacts, dentures/partials or body piercings may not be worn into surgery. Bring a case for your contacts, glasses or hearing aids, a denture cup will be supplied. Leave your suitcase in the car. After surgery it may be brought to your room. For patients admitted to the hospital, discharge time is determined by your treatment team.   Patients discharged the day of surgery will not be allowed to drive home.   Please read over the following fact sheets that you were given:   MRSA Information  __X__ Take these medicines the morning of surgery with A SIP OF WATER:  1. escitalopram (LEXAPRO) 20 MG tablet  2. esomeprazole (NEXIUM) 40 MG capsule  3. morphine (MS CONTIN) 15 MG 12 hr tablet  4. rosuvastatin (CRESTOR) 40 MG tablet  5.  6.  ____ Fleet Enema (as directed)   __X__ Use CHG Soap/SAGE wipes as directed  __X__ Use inhalers on the day of surgery  ____ Stop metformin/Janumet/Farxiga 2 days prior to surgery    ____ Take 1/2 of usual insulin dose the night before surgery. No insulin the morning          of surgery.   ____ Stop Blood Thinners Coumadin/Plavix/Xarelto/Pleta/Pradaxa/Eliquis/Effient/Aspirin  on   Or contact your Surgeon, Cardiologist or Medical Doctor regarding  ability to stop your blood thinners  __X__ Stop Anti-inflammatories 7 days before surgery such as Advil, Ibuprofen, Motrin,  BC or Goodies Powder, Naprosyn, Naproxen, Aleve, Aspirin   (STOP IBUPROFEN TODAY 09/30/19)   ____ Stop all herbal supplements, fish oil or vitamin E until after surgery.    ____ Bring C-Pap to the hospital.

## 2019-10-01 ENCOUNTER — Other Ambulatory Visit: Payer: Self-pay

## 2019-10-01 ENCOUNTER — Telehealth: Payer: Self-pay

## 2019-10-01 MED ORDER — SULFAMETHOXAZOLE-TRIMETHOPRIM 400-80 MG PO TABS: 1.0000 | ORAL_TABLET | Freq: Two times a day (BID) | ORAL | 0 refills | Status: DC

## 2019-10-01 NOTE — Telephone Encounter (Signed)
SX CLEARANCE SIGNED AND FAXED BACK TO [place] [phone].

## 2019-10-03 MED ORDER — CEFAZOLIN SODIUM-DEXTROSE 2-4 GM/100ML-% IV SOLN
2.0000 g | INTRAVENOUS | Status: AC
  Administered 2019-10-04: 2 g via INTRAVENOUS

## 2019-10-04 ENCOUNTER — Encounter: Payer: Self-pay | Admitting: Orthopedic Surgery

## 2019-10-04 ENCOUNTER — Other Ambulatory Visit: Payer: Self-pay

## 2019-10-04 ENCOUNTER — Ambulatory Visit: Payer: Medicare Other | Admitting: Registered Nurse

## 2019-10-04 ENCOUNTER — Ambulatory Visit
Admission: RE | Admit: 2019-10-04 | Discharge: 2019-10-04 | Disposition: A | Discharge status: Home / Self Care | Payer: Medicare Other | Attending: Orthopedic Surgery | Admitting: Orthopedic Surgery

## 2019-10-04 ENCOUNTER — Encounter
Admission: RE | Disposition: A | Discharge status: Home / Self Care | Payer: Self-pay | Source: Home / Self Care | Attending: Orthopedic Surgery

## 2019-10-04 DIAGNOSIS — Z79899 Other long term (current) drug therapy: Secondary | ICD-10-CM | POA: Diagnosis not present

## 2019-10-04 DIAGNOSIS — M7021 Olecranon bursitis, right elbow: Secondary | ICD-10-CM | POA: Diagnosis not present

## 2019-10-04 DIAGNOSIS — Z955 Presence of coronary angioplasty implant and graft: Secondary | ICD-10-CM | POA: Diagnosis not present

## 2019-10-04 DIAGNOSIS — F329 Major depressive disorder, single episode, unspecified: Secondary | ICD-10-CM | POA: Insufficient documentation

## 2019-10-04 DIAGNOSIS — W010XXA Fall on same level from slipping, tripping and stumbling without subsequent striking against object, initial encounter: Secondary | ICD-10-CM | POA: Insufficient documentation

## 2019-10-04 DIAGNOSIS — I252 Old myocardial infarction: Secondary | ICD-10-CM | POA: Insufficient documentation

## 2019-10-04 DIAGNOSIS — F17200 Nicotine dependence, unspecified, uncomplicated: Secondary | ICD-10-CM | POA: Insufficient documentation

## 2019-10-04 DIAGNOSIS — I10 Essential (primary) hypertension: Secondary | ICD-10-CM | POA: Diagnosis not present

## 2019-10-04 DIAGNOSIS — J449 Chronic obstructive pulmonary disease, unspecified: Secondary | ICD-10-CM | POA: Insufficient documentation

## 2019-10-04 DIAGNOSIS — K219 Gastro-esophageal reflux disease without esophagitis: Secondary | ICD-10-CM | POA: Diagnosis not present

## 2019-10-04 DIAGNOSIS — I2510 Atherosclerotic heart disease of native coronary artery without angina pectoris: Secondary | ICD-10-CM | POA: Diagnosis not present

## 2019-10-04 DIAGNOSIS — E785 Hyperlipidemia, unspecified: Secondary | ICD-10-CM | POA: Diagnosis not present

## 2019-10-04 DIAGNOSIS — S46321A Laceration of muscle, fascia and tendon of triceps, right arm, initial encounter: Secondary | ICD-10-CM | POA: Insufficient documentation

## 2019-10-04 DIAGNOSIS — Z79891 Long term (current) use of opiate analgesic: Secondary | ICD-10-CM | POA: Diagnosis not present

## 2019-10-04 DIAGNOSIS — Z7982 Long term (current) use of aspirin: Secondary | ICD-10-CM | POA: Diagnosis not present

## 2019-10-04 DIAGNOSIS — Y92009 Unspecified place in unspecified non-institutional (private) residence as the place of occurrence of the external cause: Secondary | ICD-10-CM | POA: Insufficient documentation

## 2019-10-04 DIAGNOSIS — S46312A Strain of muscle, fascia and tendon of triceps, left arm, initial encounter: Secondary | ICD-10-CM | POA: Diagnosis present

## 2019-10-04 HISTORY — PX: OLECRANON BURSECTOMY: SHX2097

## 2019-10-04 HISTORY — PX: TRICEPS TENDON REPAIR: SHX2577

## 2019-10-04 SURGERY — REPAIR, TENDON, TRICEPS
Anesthesia: General | Site: Elbow | Laterality: Right

## 2019-10-04 MED ORDER — BUPIVACAINE HCL (PF) 0.5 % IJ SOLN
INTRAMUSCULAR | Status: DC | PRN
  Administered 2019-10-04: 10 mL

## 2019-10-04 MED ORDER — DEXAMETHASONE SODIUM PHOSPHATE 10 MG/ML IJ SOLN
INTRAMUSCULAR | Status: DC | PRN
  Administered 2019-10-04: 5 mg via INTRAVENOUS

## 2019-10-04 MED ORDER — KETAMINE HCL 10 MG/ML IJ SOLN
INTRAMUSCULAR | Status: DC | PRN
  Administered 2019-10-04: 30 mg via INTRAVENOUS

## 2019-10-04 MED ORDER — FENTANYL CITRATE (PF) 250 MCG/5ML IJ SOLN
INTRAMUSCULAR | Status: AC
  Filled 2019-10-04: qty 5

## 2019-10-04 MED ORDER — PROPOFOL 10 MG/ML IV BOLUS
INTRAVENOUS | Status: DC | PRN
  Administered 2019-10-04: 100 mg via INTRAVENOUS
  Administered 2019-10-04: 50 mg via INTRAVENOUS

## 2019-10-04 MED ORDER — SUGAMMADEX SODIUM 200 MG/2ML IV SOLN
INTRAVENOUS | Status: AC
  Filled 2019-10-04: qty 2

## 2019-10-04 MED ORDER — ONDANSETRON HCL 4 MG/2ML IJ SOLN
INTRAMUSCULAR | Status: DC | PRN
  Administered 2019-10-04: 4 mg via INTRAVENOUS

## 2019-10-04 MED ORDER — ROCURONIUM BROMIDE 100 MG/10ML IV SOLN
INTRAVENOUS | Status: DC | PRN
  Administered 2019-10-04: 50 mg via INTRAVENOUS

## 2019-10-04 MED ORDER — BUPIVACAINE LIPOSOME 1.3 % IJ SUSP
INTRAMUSCULAR | Status: DC | PRN
  Administered 2019-10-04: 20 mL

## 2019-10-04 MED ORDER — OXYCODONE HCL 5 MG PO TABS: 5.0000 mg | ORAL_TABLET | Freq: Once | ORAL | Status: DC

## 2019-10-04 MED ORDER — FENTANYL CITRATE (PF) 100 MCG/2ML IJ SOLN
INTRAMUSCULAR | Status: DC | PRN
  Administered 2019-10-04 (×3): 50 ug via INTRAVENOUS

## 2019-10-04 MED ORDER — BUPIVACAINE HCL (PF) 0.5 % IJ SOLN
INTRAMUSCULAR | Status: AC
  Filled 2019-10-04: qty 30

## 2019-10-04 MED ORDER — LIDOCAINE HCL 4 % MT SOLN
OROMUCOSAL | Status: DC | PRN
  Administered 2019-10-04: 4 mL via TOPICAL

## 2019-10-04 MED ORDER — CEFAZOLIN SODIUM-DEXTROSE 2-4 GM/100ML-% IV SOLN
INTRAVENOUS | Status: AC
  Filled 2019-10-04: qty 100

## 2019-10-04 MED ORDER — BUPIVACAINE LIPOSOME 1.3 % IJ SUSP
INTRAMUSCULAR | Status: AC
  Filled 2019-10-04: qty 20

## 2019-10-04 MED ORDER — ACETAMINOPHEN 500 MG PO TABS: 1000.0000 mg | ORAL_TABLET | Freq: Three times a day (TID) | ORAL | 2 refills | Status: AC

## 2019-10-04 MED ORDER — MIDAZOLAM HCL 2 MG/2ML IJ SOLN
INTRAMUSCULAR | Status: AC
  Filled 2019-10-04: qty 2

## 2019-10-04 MED ORDER — ONDANSETRON 4 MG PO TBDP: 4.0000 mg | ORAL_TABLET | Freq: Three times a day (TID) | ORAL | 0 refills | Status: DC | PRN

## 2019-10-04 MED ORDER — FENTANYL CITRATE (PF) 100 MCG/2ML IJ SOLN
25.0000 ug | INTRAMUSCULAR | Status: DC | PRN
  Administered 2019-10-04 (×3): 25 ug via INTRAVENOUS

## 2019-10-04 MED ORDER — EPHEDRINE SULFATE 50 MG/ML IJ SOLN
INTRAMUSCULAR | Status: DC | PRN
  Administered 2019-10-04: 5 mg via INTRAVENOUS

## 2019-10-04 MED ORDER — PHENYLEPHRINE HCL (PRESSORS) 10 MG/ML IV SOLN
INTRAVENOUS | Status: DC | PRN
  Administered 2019-10-04: 100 ug via INTRAVENOUS

## 2019-10-04 MED ORDER — CHLORHEXIDINE GLUCONATE 4 % EX LIQD: 60.0000 mL | Freq: Once | CUTANEOUS | Status: DC

## 2019-10-04 MED ORDER — LIDOCAINE HCL (CARDIAC) PF 100 MG/5ML IV SOSY
PREFILLED_SYRINGE | INTRAVENOUS | Status: DC | PRN
  Administered 2019-10-04: 80 mg via INTRAVENOUS

## 2019-10-04 MED ORDER — FENTANYL CITRATE (PF) 100 MCG/2ML IJ SOLN
INTRAMUSCULAR | Status: AC
  Filled 2019-10-04: qty 2

## 2019-10-04 MED ORDER — SUGAMMADEX SODIUM 200 MG/2ML IV SOLN
INTRAVENOUS | Status: DC | PRN
  Administered 2019-10-04: 150 mg via INTRAVENOUS

## 2019-10-04 MED ORDER — OXYCODONE HCL 5 MG PO TABS: 5.0000 mg | ORAL_TABLET | ORAL | 0 refills | Status: DC | PRN

## 2019-10-04 MED ORDER — LACTATED RINGERS IV SOLN: INTRAVENOUS | Status: DC

## 2019-10-04 MED ORDER — OXYCODONE HCL 5 MG PO TABS
ORAL_TABLET | ORAL | Status: AC
  Filled 2019-10-04: qty 1

## 2019-10-04 MED ORDER — ONDANSETRON HCL 4 MG/2ML IJ SOLN
INTRAMUSCULAR | Status: AC
  Filled 2019-10-04: qty 2

## 2019-10-04 MED ORDER — FENTANYL CITRATE (PF) 100 MCG/2ML IJ SOLN
INTRAMUSCULAR | Status: AC
  Administered 2019-10-04: 25 ug via INTRAVENOUS
  Filled 2019-10-04: qty 2

## 2019-10-04 MED ORDER — KETAMINE HCL 50 MG/ML IJ SOLN
INTRAMUSCULAR | Status: AC
  Filled 2019-10-04: qty 10

## 2019-10-04 MED ORDER — MIDAZOLAM HCL 2 MG/2ML IJ SOLN
INTRAMUSCULAR | Status: DC | PRN
  Administered 2019-10-04: 1 mg via INTRAVENOUS

## 2019-10-04 MED ORDER — ONDANSETRON HCL 4 MG/2ML IJ SOLN: 4.0000 mg | Freq: Once | INTRAMUSCULAR | Status: DC | PRN

## 2019-10-04 MED ORDER — NEOMYCIN-POLYMYXIN B GU 40-200000 IR SOLN
Status: DC | PRN
  Administered 2019-10-04: 2 mL

## 2019-10-04 MED ORDER — IPRATROPIUM-ALBUTEROL 0.5-2.5 (3) MG/3ML IN SOLN
RESPIRATORY_TRACT | Status: AC
  Administered 2019-10-04: 3 mL via RESPIRATORY_TRACT
  Filled 2019-10-04: qty 3

## 2019-10-04 MED ORDER — IPRATROPIUM-ALBUTEROL 0.5-2.5 (3) MG/3ML IN SOLN: 3.0000 mL | Freq: Once | RESPIRATORY_TRACT | Status: AC

## 2019-10-04 MED ORDER — SODIUM CHLORIDE (PF) 0.9 % IJ SOLN
INTRAMUSCULAR | Status: AC
  Filled 2019-10-04: qty 10

## 2019-10-04 SURGICAL SUPPLY — 76 items
APL PRP STRL LF DISP 70% ISPRP (MISCELLANEOUS) ×2
BLADE SURG 15 STRL LF DISP TIS (BLADE) ×2 IMPLANT
BLADE SURG 15 STRL SS (BLADE) ×4
BLADE SURG SZ10 CARB STEEL (BLADE) ×8 IMPLANT
BNDG CMPR STD VLCR NS LF 5.8X4 (GAUZE/BANDAGES/DRESSINGS) ×2
BNDG COHESIVE 4X5 TAN STRL (GAUZE/BANDAGES/DRESSINGS) ×4 IMPLANT
BNDG ELASTIC 4X5.8 VLCR NS LF (GAUZE/BANDAGES/DRESSINGS) ×4 IMPLANT
BNDG ESMARK 4X12 TAN STRL LF (GAUZE/BANDAGES/DRESSINGS) ×4 IMPLANT
BUR 4.8X51.2 (BURR) IMPLANT
CANISTER SUCT 1200ML W/VALVE (MISCELLANEOUS) ×4 IMPLANT
CHLORAPREP W/TINT 26 (MISCELLANEOUS) ×4 IMPLANT
COVER WAND RF STERILE (DRAPES) ×4 IMPLANT
CUFF TOURN SGL QUICK 18X4 (TOURNIQUET CUFF) ×2 IMPLANT
CUFF TOURN SGL QUICK 24 (TOURNIQUET CUFF) ×4
CUFF TRNQT CYL 24X4X16.5-23 (TOURNIQUET CUFF) ×2 IMPLANT
DRAPE 3/4 80X56 (DRAPES) ×4 IMPLANT
DRAPE INCISE IOBAN 66X60 STRL (DRAPES) ×8 IMPLANT
DRAPE SPLIT 6X30 W/TAPE (DRAPES) ×2 IMPLANT
ELECT CAUTERY BLADE 6.4 (BLADE) ×4 IMPLANT
ELECT REM PT RETURN 9FT ADLT (ELECTROSURGICAL) ×4
ELECTRODE REM PT RTRN 9FT ADLT (ELECTROSURGICAL) ×2 IMPLANT
GAUZE SPONGE 4X4 12PLY STRL (GAUZE/BANDAGES/DRESSINGS) ×4 IMPLANT
GAUZE XEROFORM 1X8 LF (GAUZE/BANDAGES/DRESSINGS) ×4 IMPLANT
GLOVE BIOGEL PI IND STRL 8 (GLOVE) ×2 IMPLANT
GLOVE BIOGEL PI INDICATOR 8 (GLOVE) ×2
GLOVE INDICATOR 8.0 STRL GRN (GLOVE) ×4 IMPLANT
GLOVE SURG ORTHO 8.0 STRL STRW (GLOVE) ×12 IMPLANT
GLOVE SURG SYN 7.5  E (GLOVE) ×4
GLOVE SURG SYN 7.5 E (GLOVE) ×4 IMPLANT
GLOVE SURG SYN 7.5 PF PI (GLOVE) ×2 IMPLANT
GOWN STRL REUS W/ TWL LRG LVL3 (GOWN DISPOSABLE) ×2 IMPLANT
GOWN STRL REUS W/ TWL XL LVL3 (GOWN DISPOSABLE) ×2 IMPLANT
GOWN STRL REUS W/TWL LRG LVL3 (GOWN DISPOSABLE) ×8
GOWN STRL REUS W/TWL XL LVL3 (GOWN DISPOSABLE) ×8
KIT TURNOVER KIT A (KITS) ×4 IMPLANT
NDL FILTER BLUNT 18X1 1/2 (NEEDLE) ×2 IMPLANT
NDL MAYO CATGUT SZ5 (NEEDLE) ×4
NDL SUT 5 .5 CRC TPR PNT MAYO (NEEDLE) ×2 IMPLANT
NEEDLE FILTER BLUNT 18X 1/2SAF (NEEDLE) ×2
NEEDLE FILTER BLUNT 18X1 1/2 (NEEDLE) ×2 IMPLANT
NS IRRIG 500ML POUR BTL (IV SOLUTION) ×4 IMPLANT
PACK ARTHROSCOPY SHOULDER (MISCELLANEOUS) ×4 IMPLANT
PACK EXTREMITY ARMC (MISCELLANEOUS) ×4 IMPLANT
PAD ABD DERMACEA PRESS 5X9 (GAUZE/BANDAGES/DRESSINGS) ×2 IMPLANT
PAD CAST CTTN 4X4 STRL (SOFTGOODS) ×6 IMPLANT
PAD PREP 24X41 OB/GYN DISP (PERSONAL CARE ITEMS) ×2 IMPLANT
PADDING CAST BLEND 4X4 NS (MISCELLANEOUS) ×4 IMPLANT
PADDING CAST COTTON 4X4 STRL (SOFTGOODS) ×8
PASSER SUT SWANSON 36MM LOOP (INSTRUMENTS) ×4 IMPLANT
PENCIL SMOKE ULTRAEVAC 22 CON (MISCELLANEOUS) ×2 IMPLANT
RETRIEVER SUT HEWSON (MISCELLANEOUS) ×4 IMPLANT
SLING ARM LRG DEEP (SOFTGOODS) ×4 IMPLANT
SPLINT CAST 1 STEP 5X30 WHT (MISCELLANEOUS) IMPLANT
SPLINT FAST PLASTER 5X30 (CAST SUPPLIES) ×2
SPLINT PLASTER CAST FAST 5X30 (CAST SUPPLIES) ×2 IMPLANT
SPONGE LAP 18X18 RF (DISPOSABLE) ×4 IMPLANT
STAPLER SKIN PROX 35W (STAPLE) ×4 IMPLANT
STOCKINETTE IMPERVIOUS 9X36 MD (GAUZE/BANDAGES/DRESSINGS) ×4 IMPLANT
SUT ETHILON 3-0 FS-10 30 BLK (SUTURE) ×4
SUT FIBERWIRE #2 38 T-5 BLUE (SUTURE) ×4
SUT FIBERWIRE #5 38 BLUE (WIRE) ×4 IMPLANT
SUT VIC AB 0 CT2 27 (SUTURE) ×4 IMPLANT
SUT VIC AB 1 CT1 36 (SUTURE) ×4 IMPLANT
SUT VIC AB 2-0 CT1 (SUTURE) ×2 IMPLANT
SUT VIC AB 2-0 CT1 27 (SUTURE) ×8
SUT VIC AB 2-0 CT1 TAPERPNT 27 (SUTURE) ×4 IMPLANT
SUT VIC AB 2-0 CT2 27 (SUTURE) ×4 IMPLANT
SUTURE EHLN 3-0 FS-10 30 BLK (SUTURE) ×2 IMPLANT
SUTURE FIBERWR #2 38 T-5 BLUE (SUTURE) ×2 IMPLANT
SUTURE TAPE 1.3 40 TPR END (SUTURE) ×4 IMPLANT
SUTURE TAPE FIBERLINK 1.3 LOOP (SUTURE) ×2 IMPLANT
SUTURETAPE 1.3 40 TPR END (SUTURE) ×8
SUTURETAPE FIBERLINK 1.3 LOOP (SUTURE) ×4
SYR 5ML LL (SYRINGE) ×4 IMPLANT
SYSTEM IMPL ACL/PCL SWIVILLOCK (Anchor) ×2 IMPLANT
TOWEL OR 17X26 4PK STRL BLUE (TOWEL DISPOSABLE) ×4 IMPLANT

## 2019-10-04 NOTE — Op Note (Signed)
DATE OF SURGERY: 10/04/2019  PRE-OP DIAGNOSIS:  1. Right Chronic Triceps Rupture 2.  Right olecranon bursitis   POST-OP DIAGNOSIS:  1. Right Chronic Triceps Rupture 2.  Right olecranon bursitis  PROCEDURES:  1. Right Chronic Triceps Rupture 2.  Right olecranon bursa excision  SURGEON: Rosealee Albee, MD  ASSISTANT(S): Sonny Dandy, PA  ANESTHESIA: Gen  TOTAL IV FLUIDS: see anesthesia record  ESTIMATED BLOOD LOSS: 25cc  TOURNIQUET TIME: 46 min  DRAINS:  none  SPECIMENS: None.  IMPLANTS:  Arthrex 4.91mm SwiveLock with 2 SutureTapes  COMPLICATIONS: None apparent.  INDICATIONS: Zacharius R Conover is a 73 y.o. male who had a fall and sustained a L triceps rupture ~10 weeks ago. The patient was weak with elbow extension, and on exam, there was a gap in the triceps tendon at the level of the olecranon.  Additionally, he had significant pain with resisted elbow extension.  MRI confirmed complete tear triceps tendon. Surgery was recommended for triceps repair to most predictably improve his pain and function long term.  We did discuss that he should attempt to quit smoking in order to optimize his chance at successful healing.  After discussion of risks, benefits, and alternatives, the patient agreed to proceed with surgery.  DETAILS OF PROCEDURE:  The patient was identified in the preoperative holding area and appropriate informed consent was verified.  The patient was brought to the operating room and placed on the table. Anesthesia was administered. The patient was then transferred to the lateral position on a beanbag. Operative arm was placed in an arm holder so elbow rested at 90 degrees. Arm was prescrubbed with Hibiclens and alcohol, prepped with ChloraPrep and draped in the usual sterile fashion. He was given preoperative IV antibiotics within 30 minutes of the start of the case, and a surgical time-out occurred. A sterile, well-padded sterile tourniquet was placed.   Arm was  elevated, exsanguinated with an Esmarch bandage and tourniquet inflated to . A midline incision was created about the elbow curving laterally to avoid the tip of the olecranon.  There was significant olecranon bursitis overlying the olecranon.  This was excised with a combination of curved Mayo scissors, Bovie electrocautery, and a rongeur.after the olecranon bursa was excised, the triceps tendon was visualized..  There was significant chronic scar tissue at the tip of the olecranon.  This was excised with a 15 blade until healthy tendon edges were visualized.  The triceps fascia was released to allow for increased excursion and appropriate restoration on the olecranon footprint. The footprint of the tendon on the olecranon was cleared of soft tissue with a rongeur. TwoSuturetape sutures were passed in a Krakow fashion exiting just proximal to the triceps insertion. Two FiberLink loops were passed through the triceps insertion as well to be used as passing sutures. Next, medial and lateral transosseous tunnels were drilled parallel to each other from the triceps footprint on the olecranon to distally out of the posterior ulna. We then drilled and tapped for a 4.44mm SwiveLock in between the two transosseous tunnels.  A suture passer was used to pull the medial Krakow with medial FiberLink loop through the medial tunnel. The same thing was done for the lateral tunnel. Next, one medial and one lateral Krakow was passed through the medial  Fiberlink loop and shuttle through the medial tunnel. The same was performed for the lateral tunnel. This created a box configuration to allow for tendon compression at the footprint. The two Krakow strands out of the  medial tunnel were passed through the SwiveLock anchor, and the two Krakow strands out of the lateral tunnel were passed through the SwiveLock anchor in the opposite direction. We tensioned the sutures with the eyelet of the anchor at the previously drilled hole  and advanced the anchor until it was flush with the posterior cortex of the ulna. Next, a dog ear of the lateral triceps tendon was addressed by passing the #2 FiberWire sutures in a mattress fashion through this dog ear and tying a knot.  This appropriately reduce the dog ear. We then confirmed that excellent reapproximation and fixation were achieved. We were able to achieve 0-75 degrees elbow motion without significant tension through the repair.  The elbow was held in in no greater than 75 degrees of elbow flexion for the rest of the surgery.  Tourniquet was let down and hemostasis was achieved. The subdermal layer was closed with 2-0 vicryl. The skin was closed with staples. The wound was dressed with Xeroform, fluffs, and cotton wrap. A plaster splint was applied .   Instrument, sponge, and needle counts were correct prior to wound closure and at the conclusion of the case. The patient was then awakened from anesthesia and transported to PACU without complication.  Of note, assistance from a PA was essential to performing the surgery.  PA was present for the entire surgery.  PA assisted with patient positioning, retraction, instrumentation, and wound closure. The surgery would have been more difficult and had longer operative time without PA assistance.   POST-OPERATIVE PLAN: Patient will be discharged to home. The patient will be NWB in splint for 2 weeks and then transitioned to hinged elbow brace.  Follow up in 2 weeks as an outpatient. Start PT after 2 week appointment.

## 2019-10-04 NOTE — Anesthesia Procedure Notes (Signed)
Procedure Name: Intubation Date/Time: 10/04/2019 11:26 AM Performed by: Esaw Grandchild, CRNA Pre-anesthesia Checklist: Patient identified, Emergency Drugs available, Suction available and Patient being monitored Patient Re-evaluated:Patient Re-evaluated prior to induction Oxygen Delivery Method: Circle system utilized Preoxygenation: Pre-oxygenation with 100% oxygen Induction Type: IV induction Ventilation: Mask ventilation without difficulty Laryngoscope Size: Miller and 2 Grade View: Grade I Tube type: Oral Tube size: 8.0 mm Number of attempts: 1 Airway Equipment and Method: Stylet,  Oral airway and LTA kit utilized Placement Confirmation: ETT inserted through vocal cords under direct vision,  positive ETCO2 and breath sounds checked- equal and bilateral Secured at: 23 cm Tube secured with: Tape Dental Injury: Teeth and Oropharynx as per pre-operative assessment

## 2019-10-04 NOTE — Anesthesia Preprocedure Evaluation (Signed)
Anesthesia Evaluation  Patient identified by MRN, date of birth, ID band Patient awake    Reviewed: Allergy & Precautions, H&P , NPO status , Patient's Chart, lab work & pertinent test results, reviewed documented beta blocker date and time   History of Anesthesia Complications Negative for: history of anesthetic complications  Airway Mallampati: II  TM Distance: >3 FB Neck ROM: full    Dental  (+) Edentulous Upper, Edentulous Lower, Dental Advidsory Given   Pulmonary shortness of breath and with exertion, COPD, neg recent URI, Current Smoker,    Pulmonary exam normal        Cardiovascular Exercise Tolerance: Good hypertension, (-) angina+ CAD, + Past MI and + Cardiac Stents  (-) CABG Normal cardiovascular exam(-) dysrhythmias (-) Valvular Problems/Murmurs     Neuro/Psych PSYCHIATRIC DISORDERS Depression negative neurological ROS     GI/Hepatic Neg liver ROS, hiatal hernia, GERD  ,  Endo/Other  negative endocrine ROS  Renal/GU negative Renal ROS  negative genitourinary   Musculoskeletal   Abdominal   Peds  Hematology negative hematology ROS (+)   Anesthesia Other Findings Past Medical History: No date: Anemia No date: Arthritis No date: Back pain     Comment:  scoliosis No date: CAD (coronary artery disease)     Comment:  a. stent to RCA 1999.b. Last cath 01/2018 stable ->               medical therapy. No date: COPD (chronic obstructive pulmonary disease) (HCC) No date: Depression No date: Diverticulosis No date: GERD (gastroesophageal reflux disease) No date: GI bleed No date: History of colon polyps No date: History of hiatal hernia No date: Hyperkalemia No date: Hyperlipidemia No date: Hypertension No date: Scoliosis No date: Tobacco abuse   Reproductive/Obstetrics negative OB ROS                             Anesthesia Physical Anesthesia Plan  ASA: III  Anesthesia  Plan: General   Post-op Pain Management:    Induction: Intravenous  PONV Risk Score and Plan: 1 and Ondansetron, Dexamethasone and Treatment may vary due to age or medical condition  Airway Management Planned: Oral ETT  Additional Equipment:   Intra-op Plan:   Post-operative Plan: Extubation in OR  Informed Consent: I have reviewed the patients History and Physical, chart, labs and discussed the procedure including the risks, benefits and alternatives for the proposed anesthesia with the patient or authorized representative who has indicated his/her understanding and acceptance.     Dental Advisory Given  Plan Discussed with: Anesthesiologist, CRNA and Surgeon  Anesthesia Plan Comments:         Anesthesia Quick Evaluation

## 2019-10-04 NOTE — Discharge Instructions (Signed)
Elbow Surgery Post-Op Instructions  1. Splint/Cast: You will have a splint (3/4 cast) on your arm after surgery. Ensure that this remains clean and dry until follow up appointment. If this becomes wet, you need to call our offices to get it changed or else you risk skin breakdown.    2. Driving:  Plan on not driving for at least four to six weeks. Please note that you are advised NOT to drive while taking narcotic pain medications as you may be impaired and unsafe to drive.  3. Activity: Weight bearing: Non-weight bearing. Use sling for comfort as needed.   4. Medications:  - You have been provided a prescription for narcotic pain medicine. After surgery, take 1-2 narcotic tablets every 4 hours if needed for severe pain. Please start this as soon as you begin to start having pain (if you received a nerve block, start taking as soon as this wears off).  - A prescription for anti-nausea medication will be provided in case the narcotic medicine causes nausea - take 1 tablet every 6 hours only if nauseated.  -Take tylenol 1000 mg every 8 hours for pain.  May stop tylenol 5 days after surgery if you are having minimal pain.  If you are taking prescription medication for anxiety, depression, insomnia, muscle spasm, chronic pain, or for attention deficit disorder you are advised that you are at a higher risk of adverse effects with use of narcotics post-op, including narcotic addiction/dependence, depressed breathing, death. If you use non-prescribed substances: alcohol, marijuana, cocaine, heroin, methamphetamines, etc., you are at a higher risk of adverse effects with use of narcotics post-op, including narcotic addiction/dependence, depressed breathing, death. You are advised that taking > 50 morphine milligram equivalents (MME) of narcotic pain medication per day results in twice the risk of overdose or death. For your prescription provided: oxycodone 5 mg - taking more than 6 tablets per day. Be  advised that we will prescribe narcotics short-term, for acute post-operative pain only.  6. Physical Therapy: Plan to start after follow up appointment at 2 weeks. 1-2 times per week for ~12-16 weeks.  The therapist will provide home exercises. Please contact our offices if this appointment has not been scheduled.   7. Work/School: May do light duty/desk job or return to school in approximately 1-2 weeks when off of narcotics, pain is well-controlled, and swelling has decreased. May not return to full work if this includes any lifting for at least 3 months.   8. Post-Op Appointments: Your first post-op appointment will be with Dr. Posey Pronto in approximately 2 weeks time. If given to you, please bring brace to your 1st post-operative appointment.   If you find that they have not been scheduled please call the Orthopaedic Appointment front desk at [phone].   AMBULATORY SURGERY  DISCHARGE INSTRUCTIONS   1) The drugs that you were given will stay in your system until tomorrow so for the next 24 hours you should not:  A) Drive an automobile B) Make any legal decisions C) Drink any alcoholic beverage   2) You may resume regular meals tomorrow.  Today it is better to start with liquids and gradually work up to solid foods.  You may eat anything you prefer, but it is better to start with liquids, then soup and crackers, and gradually work up to solid foods.   3) Please notify your doctor immediately if you have any unusual bleeding, trouble breathing, redness and pain at the surgery site, drainage, fever, or pain not  relieved by medication.    4) Additional Instructions:        Please contact your physician with any problems or Same Day Surgery at [phone], Monday through Friday 6 am to 4 pm, or [place] [facility] number at [phone].

## 2019-10-04 NOTE — Transfer of Care (Signed)
Immediate Anesthesia Transfer of Care Note  Patient: [name]  Procedure(s) Performed: TRICEPS TENDON REPAIR AND PLECRANON BURSA EXCISION, POSSIBLE ALLOGRAFT AUGMENTATION. (Right ) OLECRANON BURSA (Right Elbow)  Patient Location: PACU  Anesthesia Type:General  Level of Consciousness: awake and alert   Airway & Oxygen Therapy: Patient Spontanous Breathing and Patient connected to face mask oxygen  Post-op Assessment: Report given to RN and Post -op Vital signs reviewed and stable  Post vital signs: Reviewed and stable  Last Vitals:  Vitals Value Taken Time  BP 119/67 10/04/19 1315  Temp    Pulse 86 10/04/19 1321  Resp 14 10/04/19 1321  SpO2 98 % 10/04/19 1321  Vitals shown include unvalidated device data.  Last Pain:  Vitals:   10/04/19 1315  TempSrc:   PainSc: 2          Complications: No apparent anesthesia complications

## 2019-10-04 NOTE — H&P (Signed)
Paper H&P to be scanned into permanent record. H&P reviewed. No significant changes noted.  

## 2019-10-04 NOTE — Anesthesia Post-op Follow-up Note (Signed)
Anesthesia QCDR form completed.        

## 2019-10-05 NOTE — Progress Notes (Signed)
No answer and left message.

## 2019-10-05 NOTE — Anesthesia Postprocedure Evaluation (Signed)
Anesthesia Post Note  Patient: [name]  Procedure(s) Performed: TRICEPS TENDON REPAIR AND PLECRANON BURSA EXCISION, POSSIBLE ALLOGRAFT AUGMENTATION. (Right ) OLECRANON BURSA (Right Elbow)  Patient location during evaluation: PACU Anesthesia Type: General Level of consciousness: awake and alert and oriented Pain management: pain level controlled Vital Signs Assessment: post-procedure vital signs reviewed and stable Respiratory status: spontaneous breathing Cardiovascular status: blood pressure returned to baseline Anesthetic complications: no     Last Vitals:  Vitals:   10/04/19 1440 10/04/19 1508  BP: (!) 111/53 (!) 112/44  Pulse: 66 89  Resp: 16   Temp: 36.6 C   SpO2: 95%     Last Pain:  Vitals:   10/04/19 1440  TempSrc: Temporal  PainSc: 4                  Tomasa Dobransky

## 2020-01-04 ENCOUNTER — Telehealth: Payer: Self-pay

## 2020-01-04 NOTE — Telephone Encounter (Signed)
Confirmed virtual visit on 01/06/2020. klh

## 2020-01-06 ENCOUNTER — Ambulatory Visit (INDEPENDENT_AMBULATORY_CARE_PROVIDER_SITE_OTHER): Payer: Medicare Other | Admitting: Internal Medicine

## 2020-01-06 ENCOUNTER — Encounter: Payer: Self-pay | Admitting: Internal Medicine

## 2020-01-06 VITALS — Resp 16 | Ht 68.0 in | Wt 160.0 lb

## 2020-01-06 DIAGNOSIS — J449 Chronic obstructive pulmonary disease, unspecified: Secondary | ICD-10-CM

## 2020-01-06 DIAGNOSIS — F17210 Nicotine dependence, cigarettes, uncomplicated: Secondary | ICD-10-CM

## 2020-01-06 DIAGNOSIS — R05 Cough: Secondary | ICD-10-CM | POA: Diagnosis not present

## 2020-01-06 DIAGNOSIS — I25758 Atherosclerosis of native coronary artery of transplanted heart with other forms of angina pectoris: Secondary | ICD-10-CM

## 2020-01-06 DIAGNOSIS — R059 Cough, unspecified: Secondary | ICD-10-CM

## 2020-01-06 MED ORDER — SPIRIVA HANDIHALER 18 MCG IN CAPS: ORAL_CAPSULE | RESPIRATORY_TRACT | 4 refills | Status: DC

## 2020-01-06 NOTE — Progress Notes (Signed)
[facility] [place], [place] [address]  Internal MEDICINE  Telephone Visit  Patient Name: [name]  [id number]  [id number]  Date of Service: 01/06/2020  I connected with the patient at 1207 by telephone and verified the patients identity using two identifiers.   I discussed the limitations, risks, security and privacy concerns of performing an evaluation and management service by telephone and the availability of in person appointments. I also discussed with the patient that there may be a patient responsible charge related to the service.  The patient expressed understanding and agrees to proceed.    Chief Complaint  Patient presents with  . Telephone Assessment    questions about medicine  . Telephone Screen  . COPD    HPI  Pt seen today via telephone.  He has a history of copd.  He reports overall he is doing well.  He denies any new symptoms.  He does report he stopped using his inhaler a year ago.  He is strarting to have some mucous production and would like to restart his Spiriva at this time.    Current Medication: Outpatient Encounter Medications as of 01/06/2020  Medication Sig  . acetaminophen (TYLENOL) 500 MG tablet Take 2 tablets (1,000 mg total) by mouth every 8 (eight) hours.  Marland Kitchen albuterol (PROVENTIL HFA;VENTOLIN HFA) 108 (90 Base) MCG/ACT inhaler Inhale 2 puffs into the lungs every 6 (six) hours as needed for wheezing or shortness of breath.  Marland Kitchen aspirin EC 81 MG tablet Take 1 tablet (81 mg total) by mouth daily.  . cyanocobalamin 500 MCG tablet Take 500 mcg by mouth 2 (two) times daily. VITAMIN B-12  . docusate sodium (COLACE) 50 MG capsule Take 50 mg by mouth 2 (two) times daily.  Marland Kitchen escitalopram (LEXAPRO) 20 MG tablet Take 1 tablet (20 mg total) by mouth daily.  Marland Kitchen esomeprazole (NEXIUM) 20 MG capsule Take 40 mg by mouth daily at 12 noon.  . gabapentin (NEURONTIN) 400 MG capsule Take 400 mg by mouth at bedtime.   . montelukast  (SINGULAIR) 10 MG tablet Take 1 tablet (10 mg total) by mouth at bedtime.  Marland Kitchen morphine (MS CONTIN) 15 MG 12 hr tablet Take 15 mg by mouth 2 (two) times daily.   . ondansetron (ZOFRAN ODT) 4 MG disintegrating tablet Take 1 tablet (4 mg total) by mouth every 8 (eight) hours as needed for nausea or vomiting.  Marland Kitchen oxyCODONE (ROXICODONE) 5 MG immediate release tablet Take 1-2 tablets (5-10 mg total) by mouth every 4 (four) hours as needed (pain).  Marland Kitchen oxyCODONE-acetaminophen (PERCOCET) 5-325 MG per tablet Take 1 tablet by mouth every 4 (four) hours as needed for moderate pain. For pain  . promethazine (PHENERGAN) 25 MG tablet Take 25 mg by mouth 2 (two) times daily.  . rosuvastatin (CRESTOR) 40 MG tablet Take 1 tablet (40 mg total) by mouth daily.  Marland Kitchen SPIRIVA HANDIHALER 18 MCG inhalation capsule INHALE THE CONTENTS OF 1 CAPSULE AT BEDTIME AS DIRECTED PER PACKAGE INSTRUCTIONS  . sulfamethoxazole-trimethoprim (BACTRIM) 400-80 MG tablet Take 1 tablet by mouth 2 (two) times daily.  . valsartan (DIOVAN) 160 MG tablet Take 1 tablet (160 mg total) by mouth daily.  . Vitamin D, Ergocalciferol, (DRISDOL) 50000 UNITS CAPS capsule Take 50,000 Units by mouth every Wednesday.    No facility-administered encounter medications on file as of 01/06/2020.    Surgical History: Past Surgical History:  Procedure Laterality Date  . APPENDECTOMY  1957  . BACK SURGERY    .  CARDIAC CATHETERIZATION  1999   stent placement RCA  . CARDIAC CATHETERIZATION  2010   mild left CA distal stenosis, mild irregularities in RCA and LAD. patent setn in RCA  . COLONOSCOPY WITH PROPOFOL N/A 06/16/2018   Procedure: COLONOSCOPY WITH PROPOFOL;  Surgeon: Toledo, Boykin Nearing, MD;  Location: [facility] ENDOSCOPY;  Service: Gastroenterology;  Laterality: N/A;  . CORONARY ANGIOPLASTY    . CORONARY ARTERY BYPASS GRAFT     denies 09/30/19  . ESOPHAGOGASTRODUODENOSCOPY (EGD) WITH PROPOFOL N/A 06/16/2018   Procedure: ESOPHAGOGASTRODUODENOSCOPY (EGD) WITH  PROPOFOL;  Surgeon: Toledo, Boykin Nearing, MD;  Location: [facility] ENDOSCOPY;  Service: Gastroenterology;  Laterality: N/A;  . FOOT SURGERY  1973   left  . LEFT HEART CATH AND CORONARY ANGIOGRAPHY N/A 01/22/2018   Procedure: LEFT HEART CATH AND CORONARY ANGIOGRAPHY;  Surgeon: Kathleene Hazel, MD;  Location: [facility] INVASIVE CV LAB;  Service: Cardiovascular;  Laterality: N/A;  . LEFT HEART CATHETERIZATION WITH CORONARY ANGIOGRAM N/A 01/11/2015   Procedure: LEFT HEART CATHETERIZATION WITH CORONARY ANGIOGRAM;  Surgeon: Kathleene Hazel, MD;  Location: [facility] CATH LAB;  Service: Cardiovascular;  Laterality: N/A;  . OLECRANON BURSECTOMY Right 10/04/2019   Procedure: OLECRANON BURSA;  Surgeon: Signa Kell, MD;  Location: [facility] ORS;  Service: Orthopedics;  Laterality: Right;  . TRICEPS TENDON REPAIR Right 10/04/2019   Procedure: TRICEPS TENDON REPAIR AND PLECRANON BURSA EXCISION, POSSIBLE ALLOGRAFT AUGMENTATION.;  Surgeon: Signa Kell, MD;  Location: [facility] ORS;  Service: Orthopedics;  Laterality: Right;    Medical History: Past Medical History:  Diagnosis Date  . Anemia   . Arthritis   . Back pain    scoliosis  . CAD (coronary artery disease)    a. stent to RCA 1999.b. Last cath 01/2018 stable -> medical therapy.  Marland Kitchen COPD (chronic obstructive pulmonary disease) (HCC)   . Depression   . Diverticulosis   . GERD (gastroesophageal reflux disease)   . GI bleed   . History of colon polyps   . History of hiatal hernia   . Hyperkalemia   . Hyperlipidemia   . Hypertension   . Scoliosis   . Tobacco abuse     Family History: Family History  Problem Relation Age of Onset  . Alzheimer's disease Father   . Angina Father   . Diabetes Cousin        mothers side  . Heart attack Cousin        fathers side  . Hyperlipidemia Cousin        fathers side  . Hypertension Cousin        fathers side    Social History   Socioeconomic History  . Marital status: Divorced    Spouse name: Not on file  .  Number of children: 3  . Years of education: Not on file  . Highest education level: Not on file  Occupational History    Employer: RETIRED  Tobacco Use  . Smoking status: Current Every Day Smoker    Packs/day: 0.50    Years: 35.00    Pack years: 17.50    Types: Cigarettes  . Smokeless tobacco: Never Used  Substance and Sexual Activity  . Alcohol use: Yes    Alcohol/week: 6.0 standard drinks    Types: 6 Cans of beer per week  . Drug use: No  . Sexual activity: Not on file  Other Topics Concern  . Not on file  Social History Narrative  . Not on file   Social Determinants of Health   Financial  Resource Strain:   . Difficulty of Paying Living Expenses:   Food Insecurity:   . Worried About Programme researcher, broadcasting/film/video in the Last Year:   . Barista in the Last Year:   Transportation Needs:   . Freight forwarder (Medical):   Marland Kitchen Lack of Transportation (Non-Medical):   Physical Activity:   . Days of Exercise per Week:   . Minutes of Exercise per Session:   Stress:   . Feeling of Stress :   Social Connections:   . Frequency of Communication with Friends and Family:   . Frequency of Social Gatherings with Friends and Family:   . Attends Religious Services:   . Active Member of Clubs or Organizations:   . Attends Banker Meetings:   Marland Kitchen Marital Status:   Intimate Partner Violence:   . Fear of Current or Ex-Partner:   . Emotionally Abused:   Marland Kitchen Physically Abused:   . Sexually Abused:       Review of Systems  Vital Signs: Resp 16   Ht 5\' 8"  (1.727 m)   Wt 160 lb (72.6 kg)   BMI 24.33 kg/m    Observation/Objective: Well sounding, NAD noted    Assessment/Plan: 1. Obstructive chronic bronchitis without exacerbation (HCC) Refilled Spiriva as discussed.  - tiotropium (SPIRIVA HANDIHALER) 18 MCG inhalation capsule; INHALE THE CONTENTS OF 1 CAPSULE AT BEDTIME AS DIRECTED PER PACKAGE INSTRUCTIONS  Dispense: 90 capsule; Refill: 4  2. Cough Use inhaler  as discussed. If symptoms fail to improve, return to clinic.  3. Nicotine dependence, cigarettes, uncomplicated Smoking cessation counseling: 1. Pt acknowledges the risks of long term smoking, she will try to quite smoking. 2. Options for different medications including nicotine products, chewing gum, patch etc, Wellbutrin and Chantix is discussed 3. Goal and date of compete cessation is discussed 4. Total time spent in smoking cessation is 15 min.  4. 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  General Counseling: [place] verbalizes understanding of the findings of today's phone visit and agrees with plan of treatment. I have discussed any further diagnostic evaluation that may be needed or ordered today. We also reviewed his medications today. he has been encouraged to call the office with any questions or concerns that should arise related to todays visit.    No orders of the defined types were placed in this encounter.   No orders of the defined types were placed in this encounter.   Time spent: 25 Minutes    26 AGNP-C Pulmonary medicine

## 2020-01-10 MED ORDER — ONDANSETRON HCL 4 MG/2ML IJ SOLN
4.00 | INTRAMUSCULAR | Status: DC
Start: ? — End: 2020-01-10

## 2020-01-10 MED ORDER — GENERIC EXTERNAL MEDICATION
Status: DC
Start: ? — End: 2020-01-10

## 2020-01-10 MED ORDER — MELATONIN 3 MG PO TABS
3.00 | ORAL_TABLET | ORAL | Status: DC
Start: 2020-01-11 — End: 2020-01-10

## 2020-01-10 MED ORDER — MAGNESIUM OXIDE 400 MG PO TABS
400.00 | ORAL_TABLET | ORAL | Status: DC
Start: 2020-01-12 — End: 2020-01-10

## 2020-01-10 MED ORDER — IPRATROPIUM-ALBUTEROL 0.5-2.5 (3) MG/3ML IN SOLN
3.00 | RESPIRATORY_TRACT | Status: DC
Start: 2020-01-10 — End: 2020-01-10

## 2020-01-10 MED ORDER — GENERIC EXTERNAL MEDICATION
250.00 | Status: DC
Start: 2020-01-11 — End: 2020-01-10

## 2020-01-10 MED ORDER — PANTOPRAZOLE SODIUM 40 MG PO TBEC
40.00 | DELAYED_RELEASE_TABLET | ORAL | Status: DC
Start: 2020-01-11 — End: 2020-01-10

## 2020-01-10 MED ORDER — NICOTINE 14 MG/24HR TD PT24
1.00 | MEDICATED_PATCH | TRANSDERMAL | Status: DC
Start: 2020-01-12 — End: 2020-01-10

## 2020-01-10 MED ORDER — MORPHINE SULFATE 2 MG/ML IJ SOLN
1.00 | INTRAMUSCULAR | Status: DC
Start: ? — End: 2020-01-10

## 2020-01-10 MED ORDER — ESCITALOPRAM OXALATE 10 MG PO TABS
20.00 | ORAL_TABLET | ORAL | Status: DC
Start: 2020-01-12 — End: 2020-01-10

## 2020-01-10 MED ORDER — LOPERAMIDE HCL 2 MG PO CAPS
2.00 | ORAL_CAPSULE | ORAL | Status: DC
Start: ? — End: 2020-01-10

## 2020-01-10 MED ORDER — CLONIDINE HCL 0.1 MG PO TABS
0.10 | ORAL_TABLET | ORAL | Status: DC
Start: 2020-01-11 — End: 2020-01-10

## 2020-01-10 MED ORDER — PREDNISONE 20 MG PO TABS
40.00 | ORAL_TABLET | ORAL | Status: DC
Start: 2020-01-11 — End: 2020-01-10

## 2020-01-10 MED ORDER — POTASSIUM CHLORIDE CRYS ER 20 MEQ PO TBCR
40.00 | EXTENDED_RELEASE_TABLET | ORAL | Status: DC
Start: 2020-01-11 — End: 2020-01-10

## 2020-01-10 MED ORDER — MORPHINE SULFATE 4 MG/ML IJ SOLN
4.00 | INTRAMUSCULAR | Status: DC
Start: 2020-01-11 — End: 2020-01-10

## 2020-01-10 MED ORDER — LORAZEPAM 2 MG/ML IJ SOLN
2.00 | INTRAMUSCULAR | Status: DC
Start: 2020-01-11 — End: 2020-01-10

## 2020-01-10 MED ORDER — ALBUTEROL SULFATE (2.5 MG/3ML) 0.083% IN NEBU
2.50 | INHALATION_SOLUTION | RESPIRATORY_TRACT | Status: DC
Start: ? — End: 2020-01-10

## 2020-01-10 MED ORDER — UMECLIDINIUM BROMIDE 62.5 MCG/INH IN AEPB
1.00 | INHALATION_SPRAY | RESPIRATORY_TRACT | Status: DC
Start: 2020-01-12 — End: 2020-01-10

## 2020-01-10 MED ORDER — MONTELUKAST SODIUM 10 MG PO TABS
10.00 | ORAL_TABLET | ORAL | Status: DC
Start: 2020-01-11 — End: 2020-01-10

## 2020-01-10 MED ORDER — FERROUS SULFATE 325 (65 FE) MG PO TABS
325.00 | ORAL_TABLET | ORAL | Status: DC
Start: 2020-01-12 — End: 2020-01-10

## 2020-01-10 MED ORDER — FOLIC ACID 1 MG PO TABS
1.00 | ORAL_TABLET | ORAL | Status: DC
Start: 2020-01-11 — End: 2020-01-10

## 2020-01-10 MED ORDER — ATORVASTATIN CALCIUM 40 MG PO TABS
80.00 | ORAL_TABLET | ORAL | Status: DC
Start: 2020-01-11 — End: 2020-01-10

## 2020-01-10 MED ORDER — ACETAMINOPHEN 325 MG PO TABS
650.00 | ORAL_TABLET | ORAL | Status: DC
Start: ? — End: 2020-01-10

## 2020-01-11 ENCOUNTER — Inpatient Hospital Stay
Admission: AD | Admit: 2020-01-11 | Discharge: 2020-01-14 | DRG: 177 | Disposition: A | Discharge status: Home Health | Payer: Medicare Other | Source: Other Acute Inpatient Hospital | Attending: Internal Medicine | Admitting: Internal Medicine

## 2020-01-11 ENCOUNTER — Ambulatory Visit (HOSPITAL_COMMUNITY)
Admission: AD | Admit: 2020-01-11 | Discharge: 2020-01-11 | Disposition: A | Discharge status: Home / Self Care | Payer: Medicare Other | Source: Other Acute Inpatient Hospital | Attending: Internal Medicine | Admitting: Internal Medicine

## 2020-01-11 DIAGNOSIS — D6959 Other secondary thrombocytopenia: Secondary | ICD-10-CM | POA: Diagnosis present

## 2020-01-11 DIAGNOSIS — E46 Unspecified protein-calorie malnutrition: Secondary | ICD-10-CM | POA: Diagnosis present

## 2020-01-11 DIAGNOSIS — J189 Pneumonia, unspecified organism: Secondary | ICD-10-CM | POA: Diagnosis present

## 2020-01-11 DIAGNOSIS — E785 Hyperlipidemia, unspecified: Secondary | ICD-10-CM | POA: Diagnosis present

## 2020-01-11 DIAGNOSIS — Z20822 Contact with and (suspected) exposure to covid-19: Secondary | ICD-10-CM | POA: Diagnosis present

## 2020-01-11 DIAGNOSIS — I2510 Atherosclerotic heart disease of native coronary artery without angina pectoris: Secondary | ICD-10-CM | POA: Diagnosis present

## 2020-01-11 DIAGNOSIS — J432 Centrilobular emphysema: Secondary | ICD-10-CM | POA: Diagnosis present

## 2020-01-11 DIAGNOSIS — F3342 Major depressive disorder, recurrent, in full remission: Secondary | ICD-10-CM | POA: Diagnosis present

## 2020-01-11 DIAGNOSIS — G8929 Other chronic pain: Secondary | ICD-10-CM | POA: Diagnosis present

## 2020-01-11 DIAGNOSIS — J9601 Acute respiratory failure with hypoxia: Secondary | ICD-10-CM | POA: Diagnosis present

## 2020-01-11 DIAGNOSIS — K219 Gastro-esophageal reflux disease without esophagitis: Secondary | ICD-10-CM | POA: Diagnosis present

## 2020-01-11 DIAGNOSIS — D649 Anemia, unspecified: Secondary | ICD-10-CM | POA: Diagnosis present

## 2020-01-11 DIAGNOSIS — F10239 Alcohol dependence with withdrawal, unspecified: Secondary | ICD-10-CM | POA: Diagnosis present

## 2020-01-11 DIAGNOSIS — Z8249 Family history of ischemic heart disease and other diseases of the circulatory system: Secondary | ICD-10-CM

## 2020-01-11 DIAGNOSIS — G928 Other toxic encephalopathy: Secondary | ICD-10-CM | POA: Diagnosis present

## 2020-01-11 DIAGNOSIS — Z881 Allergy status to other antibiotic agents status: Secondary | ICD-10-CM | POA: Diagnosis not present

## 2020-01-11 DIAGNOSIS — M545 Low back pain: Secondary | ICD-10-CM | POA: Diagnosis present

## 2020-01-11 DIAGNOSIS — F10231 Alcohol dependence with withdrawal delirium: Secondary | ICD-10-CM | POA: Diagnosis not present

## 2020-01-11 DIAGNOSIS — F17210 Nicotine dependence, cigarettes, uncomplicated: Secondary | ICD-10-CM | POA: Diagnosis present

## 2020-01-11 DIAGNOSIS — Z833 Family history of diabetes mellitus: Secondary | ICD-10-CM | POA: Diagnosis not present

## 2020-01-11 DIAGNOSIS — Z820 Family history of epilepsy and other diseases of the nervous system: Secondary | ICD-10-CM

## 2020-01-11 DIAGNOSIS — F1120 Opioid dependence, uncomplicated: Secondary | ICD-10-CM | POA: Diagnosis not present

## 2020-01-11 DIAGNOSIS — I25758 Atherosclerosis of native coronary artery of transplanted heart with other forms of angina pectoris: Secondary | ICD-10-CM | POA: Diagnosis present

## 2020-01-11 DIAGNOSIS — J810 Acute pulmonary edema: Secondary | ICD-10-CM | POA: Diagnosis not present

## 2020-01-11 DIAGNOSIS — D638 Anemia in other chronic diseases classified elsewhere: Secondary | ICD-10-CM | POA: Diagnosis present

## 2020-01-11 DIAGNOSIS — I10 Essential (primary) hypertension: Secondary | ICD-10-CM | POA: Diagnosis present

## 2020-01-11 DIAGNOSIS — D696 Thrombocytopenia, unspecified: Secondary | ICD-10-CM | POA: Diagnosis present

## 2020-01-11 DIAGNOSIS — I25118 Atherosclerotic heart disease of native coronary artery with other forms of angina pectoris: Secondary | ICD-10-CM | POA: Diagnosis present

## 2020-01-11 DIAGNOSIS — Z955 Presence of coronary angioplasty implant and graft: Secondary | ICD-10-CM | POA: Diagnosis not present

## 2020-01-11 DIAGNOSIS — J690 Pneumonitis due to inhalation of food and vomit: Principal | ICD-10-CM | POA: Diagnosis present

## 2020-01-11 DIAGNOSIS — G92 Toxic encephalopathy: Secondary | ICD-10-CM | POA: Diagnosis present

## 2020-01-11 DIAGNOSIS — M1990 Unspecified osteoarthritis, unspecified site: Secondary | ICD-10-CM | POA: Diagnosis present

## 2020-01-11 DIAGNOSIS — J181 Lobar pneumonia, unspecified organism: Secondary | ICD-10-CM | POA: Diagnosis not present

## 2020-01-11 DIAGNOSIS — K5790 Diverticulosis of intestine, part unspecified, without perforation or abscess without bleeding: Secondary | ICD-10-CM | POA: Diagnosis present

## 2020-01-11 DIAGNOSIS — M419 Scoliosis, unspecified: Secondary | ICD-10-CM | POA: Diagnosis present

## 2020-01-11 DIAGNOSIS — Z720 Tobacco use: Secondary | ICD-10-CM | POA: Diagnosis present

## 2020-01-11 DIAGNOSIS — Z6822 Body mass index (BMI) 22.0-22.9, adult: Secondary | ICD-10-CM

## 2020-01-11 DIAGNOSIS — Z7982 Long term (current) use of aspirin: Secondary | ICD-10-CM

## 2020-01-11 DIAGNOSIS — F1123 Opioid dependence with withdrawal: Secondary | ICD-10-CM | POA: Diagnosis present

## 2020-01-11 DIAGNOSIS — F10931 Alcohol use, unspecified with withdrawal delirium: Secondary | ICD-10-CM | POA: Diagnosis present

## 2020-01-11 LAB — COMPREHENSIVE METABOLIC PANEL
ALT: 17 U/L (ref 0–44)
AST: 19 U/L (ref 15–41)
Albumin: 3.1 g/dL — ABNORMAL LOW (ref 3.5–5.0)
Alkaline Phosphatase: 68 U/L (ref 38–126)
Anion gap: 9 (ref 5–15)
BUN: 14 mg/dL (ref 8–23)
CO2: 24 mmol/L (ref 22–32)
Calcium: 9 mg/dL (ref 8.9–10.3)
Chloride: 105 mmol/L (ref 98–111)
Creatinine, Ser: 0.65 mg/dL (ref 0.61–1.24)
GFR calc Af Amer: >60 mL/min (ref >60)
GFR calc non Af Amer: >60 mL/min (ref >60)
Glucose, Bld: 108 mg/dL — ABNORMAL HIGH (ref 70–99)
Potassium: 3.7 mmol/L (ref 3.5–5.1)
Sodium: 138 mmol/L (ref 135–145)
Total Bilirubin: 0.5 mg/dL (ref 0.3–1.2)
Total Protein: 6.8 g/dL (ref 6.5–8.1)

## 2020-01-11 LAB — CBC
HCT: 28.1 % — ABNORMAL LOW (ref 39.0–52.0)
Hemoglobin: 9.4 g/dL — ABNORMAL LOW (ref 13.0–17.0)
MCH: 31.8 pg (ref 26.0–34.0)
MCHC: 33.5 g/dL (ref 30.0–36.0)
MCV: 94.9 fL (ref 80.0–100.0)
Platelets: 129 10*3/uL — ABNORMAL LOW (ref 150–400)
RBC: 2.96 MIL/uL — ABNORMAL LOW (ref 4.22–5.81)
RDW: 13.2 % (ref 11.5–15.5)
WBC: 5.5 10*3/uL (ref 4.0–10.5)
nRBC: 0 % (ref 0.0–0.2)

## 2020-01-11 LAB — MAGNESIUM: Magnesium: 2.1 mg/dL (ref 1.7–2.4)

## 2020-01-11 LAB — GLUCOSE, CAPILLARY
Glucose-Capillary: 114 mg/dL — ABNORMAL HIGH (ref 70–99)
Glucose-Capillary: 125 mg/dL — ABNORMAL HIGH (ref 70–99)

## 2020-01-11 LAB — MRSA PCR SCREENING: MRSA by PCR: NEGATIVE

## 2020-01-11 LAB — PHOSPHORUS: Phosphorus: 3.1 mg/dL (ref 2.5–4.6)

## 2020-01-11 MED ORDER — AZITHROMYCIN 250 MG PO TABS
500.00 | ORAL_TABLET | ORAL | Status: DC
Start: 2020-01-11 — End: 2020-01-11

## 2020-01-11 MED ORDER — THIAMINE HCL 100 MG PO TABS
100.0000 mg | ORAL_TABLET | Freq: Every day | ORAL | Status: DC
  Administered 2020-01-12 – 2020-01-14 (×3): 100 mg via ORAL
  Filled 2020-01-11 (×4): qty 1

## 2020-01-11 MED ORDER — ACETAMINOPHEN 650 MG RE SUPP: 650.0000 mg | Freq: Four times a day (QID) | RECTAL | Status: DC | PRN

## 2020-01-11 MED ORDER — THIAMINE HCL 100 MG/ML IJ SOLN
100.00 | INTRAMUSCULAR | Status: DC
Start: 2020-01-12 — End: 2020-01-11

## 2020-01-11 MED ORDER — METHYLPREDNISOLONE SODIUM SUCC 125 MG IJ SOLR
40.00 | INTRAMUSCULAR | Status: DC
Start: 2020-01-12 — End: 2020-01-11

## 2020-01-11 MED ORDER — METHYLPREDNISOLONE SODIUM SUCC 40 MG IJ SOLR
40.0000 mg | Freq: Two times a day (BID) | INTRAMUSCULAR | Status: DC
  Administered 2020-01-11 – 2020-01-12 (×2): 40 mg via INTRAVENOUS
  Filled 2020-01-11 (×2): qty 1

## 2020-01-11 MED ORDER — PANTOPRAZOLE SODIUM 40 MG IV SOLR
40.0000 mg | INTRAVENOUS | Status: DC
  Administered 2020-01-11 – 2020-01-13 (×3): 40 mg via INTRAVENOUS
  Filled 2020-01-11 (×3): qty 40

## 2020-01-11 MED ORDER — LORAZEPAM 1 MG PO TABS: 1.0000 mg | ORAL_TABLET | ORAL | Status: DC | PRN

## 2020-01-11 MED ORDER — SODIUM CHLORIDE 0.9 % IV SOLN
250.0000 mL | INTRAVENOUS | Status: DC | PRN
  Administered 2020-01-12 – 2020-01-13 (×2): 250 mL via INTRAVENOUS

## 2020-01-11 MED ORDER — LORAZEPAM 2 MG/ML IJ SOLN: 1.0000 mg | INTRAMUSCULAR | Status: DC | PRN

## 2020-01-11 MED ORDER — IPRATROPIUM-ALBUTEROL 0.5-2.5 (3) MG/3ML IN SOLN
3.00 | RESPIRATORY_TRACT | Status: DC
Start: ? — End: 2020-01-11

## 2020-01-11 MED ORDER — FOLIC ACID 5 MG/ML IJ SOLN
1.00 | INTRAMUSCULAR | Status: DC
Start: 2020-01-12 — End: 2020-01-11

## 2020-01-11 MED ORDER — DEXTROSE-NACL 5-0.9 % IV SOLN: INTRAVENOUS | Status: DC

## 2020-01-11 MED ORDER — ACETAMINOPHEN 325 MG PO TABS
650.0000 mg | ORAL_TABLET | Freq: Four times a day (QID) | ORAL | Status: DC | PRN
  Administered 2020-01-13 – 2020-01-14 (×2): 650 mg via ORAL
  Filled 2020-01-11 (×2): qty 2

## 2020-01-11 MED ORDER — SODIUM CHLORIDE 0.9% FLUSH: 3.0000 mL | INTRAVENOUS | Status: DC | PRN

## 2020-01-11 MED ORDER — MORPHINE SULFATE (PF) 4 MG/ML IV SOLN
4.0000 mg | Freq: Four times a day (QID) | INTRAVENOUS | Status: DC
  Administered 2020-01-11 – 2020-01-13 (×7): 4 mg via INTRAVENOUS
  Filled 2020-01-11 (×7): qty 1

## 2020-01-11 MED ORDER — GENERIC EXTERNAL MEDICATION
Status: DC
Start: ? — End: 2020-01-11

## 2020-01-11 MED ORDER — GENERIC EXTERNAL MEDICATION
3.00 | Status: DC
Start: 2020-01-11 — End: 2020-01-11

## 2020-01-11 MED ORDER — ONDANSETRON HCL 4 MG PO TABS: 4.0000 mg | ORAL_TABLET | Freq: Four times a day (QID) | ORAL | Status: DC | PRN

## 2020-01-11 MED ORDER — THIAMINE HCL 100 MG/ML IJ SOLN
100.0000 mg | Freq: Every day | INTRAMUSCULAR | Status: DC
  Administered 2020-01-11: 100 mg via INTRAVENOUS
  Filled 2020-01-11 (×3): qty 2

## 2020-01-11 MED ORDER — ONDANSETRON HCL 4 MG/2ML IJ SOLN: 4.0000 mg | Freq: Four times a day (QID) | INTRAMUSCULAR | Status: DC | PRN

## 2020-01-11 MED ORDER — BISACODYL 10 MG RE SUPP: 10.0000 mg | Freq: Every day | RECTAL | Status: DC | PRN

## 2020-01-11 MED ORDER — ADULT MULTIVITAMIN W/MINERALS CH
1.0000 | ORAL_TABLET | Freq: Every day | ORAL | Status: DC
  Administered 2020-01-12 – 2020-01-14 (×3): 1 via ORAL
  Filled 2020-01-11 (×4): qty 1

## 2020-01-11 MED ORDER — FUROSEMIDE 10 MG/ML IJ SOLN
20.00 | INTRAMUSCULAR | Status: DC
Start: ? — End: 2020-01-11

## 2020-01-11 MED ORDER — SODIUM CHLORIDE 0.9% FLUSH
3.0000 mL | Freq: Two times a day (BID) | INTRAVENOUS | Status: DC
  Administered 2020-01-12 – 2020-01-14 (×4): 3 mL via INTRAVENOUS

## 2020-01-11 MED ORDER — NICOTINE 21 MG/24HR TD PT24
21.0000 mg | MEDICATED_PATCH | Freq: Every day | TRANSDERMAL | Status: DC
  Administered 2020-01-11 – 2020-01-13 (×3): 21 mg via TRANSDERMAL
  Filled 2020-01-11 (×4): qty 1

## 2020-01-11 MED ORDER — ENOXAPARIN SODIUM 40 MG/0.4ML ~~LOC~~ SOLN
40.0000 mg | SUBCUTANEOUS | Status: DC
  Administered 2020-01-11 – 2020-01-13 (×3): 40 mg via SUBCUTANEOUS
  Filled 2020-01-11 (×3): qty 0.4

## 2020-01-11 MED ORDER — IPRATROPIUM-ALBUTEROL 0.5-2.5 (3) MG/3ML IN SOLN: 3.0000 mL | Freq: Four times a day (QID) | RESPIRATORY_TRACT | Status: DC | PRN

## 2020-01-11 NOTE — H&P (Addendum)
TRH H&P   Patient Demographics:    [name], is a 74 y.o. male  MRN: [id number]   DOB - Jan 31, 1946  Admit Date - 01/11/2020  Outpatient Primary MD for the patient is Barbette Reichmann, MD  Referring MD: Kahi Mohala  Outpatient Specialists: None  Patient coming from: Transfer from [facility]  Chief complaint Pneumonia   HPI:    [name]  is a 74 y.o. male, with history of CAD s/p stent in 1999, alcohol abuse (drinks 6-7 beers daily with history of withdrawals), ongoing tobacco use, COPD not on home O2, depression, chronic low back pain on oxycodone and morphine who was admitted to [facility] on 3/27 with sepsis secondary to left lobar pneumonia and acute toxic metabolic encephalopathy suspicious for alcohol and opiate withdrawal. Patient remained encephalopathic during the hospital stay with episodes of tachypnea and tachycardia.  He was broadly covered with IV Rocephin and azithromycin then transition to IV Unasyn for his pneumonia.  Patient remained n.p.o. and was given IV fluids but went into flash pulmonary edema requiring high amount of oxygen.  Given care patient was previously followed by subspecialty here (?  PCP/GI/cardiology) family requested patient to be transferred to [facility].  Given concern for ongoing alcohol withdrawal and need for Precedex patient was accepted to stepdown unit. His BNP elevated to 9000 today.  A 2D echo was done but no results available.  Patient had CT angiogram of the chest done on 3/30 with increasing shortness of breath showing multifocal pneumonia, enlarged mediastinal lymph node and small bilateral pleural effusion.  It was negative for pulmonary embolism.  On reviewing documentation on care everywhere I was unable to find lab work tested for COVID-19 PCR at [facility]. Patient somnolent and  snoring and unable to provide any history.  He is arousable to commands and knows he is in the hospital and able to tell the year. His labs on admission at outside hospital showed mild anemia and thrombocytopenia. (hemoglobin of 9 and platelets in 100s,) this had improved on follow-up labs today. Patient has remained n.p.o. since admission.  Was getting IV fluids but discontinued as he developed acute pulmonary edema.  Blood cultures on 3/27 were negative for growth. Patient was placed on scheduled morphine 4 mg every 6 hours at the facility with concern for opiate withdrawal.  Unable to reach his family so no further history obtained about his social/home situation.  Review of systems:    As outlined in HPI.  Full review of systems limited due to patient's encephalopathy.     With Past History of the following :    Past Medical History:  Diagnosis Date  . Anemia   . Arthritis   . Back pain    scoliosis  . CAD (coronary artery disease)    a. stent to RCA 1999.b. Last cath 01/2018 stable ->  medical therapy.  Marland Kitchen COPD (chronic obstructive pulmonary disease) ([place])   . Depression   . Diverticulosis   . GERD (gastroesophageal reflux disease)   . GI bleed   . History of colon polyps   . History of hiatal hernia   . Hyperkalemia   . Hyperlipidemia   . Hypertension   . Scoliosis   . Tobacco abuse       Past Surgical History:  Procedure Laterality Date  . APPENDECTOMY  1957  . BACK SURGERY    . CARDIAC CATHETERIZATION  1999   stent placement RCA  . CARDIAC CATHETERIZATION  2010   mild left CA distal stenosis, mild irregularities in RCA and LAD. patent setn in RCA  . COLONOSCOPY WITH PROPOFOL N/A 06/16/2018   Procedure: COLONOSCOPY WITH PROPOFOL;  Surgeon: Toledo, Benay Pike, MD;  Location: [facility] ENDOSCOPY;  Service: Gastroenterology;  Laterality: N/A;  . CORONARY ANGIOPLASTY    . CORONARY ARTERY BYPASS GRAFT     denies 09/30/19  . ESOPHAGOGASTRODUODENOSCOPY (EGD) WITH PROPOFOL N/A  06/16/2018   Procedure: ESOPHAGOGASTRODUODENOSCOPY (EGD) WITH PROPOFOL;  Surgeon: Toledo, Benay Pike, MD;  Location: [facility] ENDOSCOPY;  Service: Gastroenterology;  Laterality: N/A;  . FOOT SURGERY  1973   left  . LEFT HEART CATH AND CORONARY ANGIOGRAPHY N/A 01/22/2018   Procedure: LEFT HEART CATH AND CORONARY ANGIOGRAPHY;  Surgeon: Burnell Blanks, MD;  Location: [facility];  Service: Cardiovascular;  Laterality: N/A;  . LEFT HEART CATHETERIZATION WITH CORONARY ANGIOGRAM N/A 01/11/2015   Procedure: LEFT HEART CATHETERIZATION WITH CORONARY ANGIOGRAM;  Surgeon: Burnell Blanks, MD;  Location: [facility] CATH LAB;  Service: Cardiovascular;  Laterality: N/A;  . OLECRANON BURSECTOMY Right 10/04/2019   Procedure: OLECRANON BURSA;  Surgeon: Leim Fabry, MD;  Location: [facility] ORS;  Service: Orthopedics;  Laterality: Right;  . TRICEPS TENDON REPAIR Right 10/04/2019   Procedure: TRICEPS TENDON REPAIR AND PLECRANON BURSA EXCISION, POSSIBLE ALLOGRAFT AUGMENTATION.;  Surgeon: Leim Fabry, MD;  Location: [facility] ORS;  Service: Orthopedics;  Laterality: Right;      Social History:     Social History   Tobacco Use  . Smoking status: Current Every Day Smoker    Packs/day: 0.50    Years: 35.00    Pack years: 17.50    Types: Cigarettes  . Smokeless tobacco: Never Used  Substance Use Topics  . Alcohol use: Yes    Alcohol/week: 6.0 standard drinks    Types: 6 Cans of beer per week     Lives -Home  Mobility -independent prior to hospitalization (unable to contact family to obtain home situation.)     Family History :     Family History  Problem Relation Age of Onset  . Alzheimer's disease Father   . Angina Father   . Diabetes Cousin        mothers side  . Heart attack Cousin        fathers side  . Hyperlipidemia Cousin        fathers side  . Hypertension Cousin        fathers side      Home Medications:   Prior to Admission medications   Medication Sig Start Date End Date  Taking? Authorizing Provider  acetaminophen (TYLENOL) 500 MG tablet Take 2 tablets (1,000 mg total) by mouth every 8 (eight) hours. 10/04/19 10/03/20  Leim Fabry, MD  albuterol (PROVENTIL HFA;VENTOLIN HFA) 108 (90 Base) MCG/ACT inhaler Inhale 2 puffs into the lungs every 6 (six) hours as needed for wheezing or  shortness of breath. 12/09/18   Emily Filbert, MD  aspirin EC 81 MG tablet Take 1 tablet (81 mg total) by mouth daily. 02/24/18   Robbie Lis M, PA-C  cyanocobalamin 500 MCG tablet Take 500 mcg by mouth 2 (two) times daily. VITAMIN B-12 08/18/14   [provider]  docusate sodium (COLACE) 50 MG capsule Take 50 mg by mouth 2 (two) times daily.    [provider]  escitalopram (LEXAPRO) 20 MG tablet Take 1 tablet (20 mg total) by mouth daily. 04/12/14   Kathleene Hazel, MD  esomeprazole (NEXIUM) 20 MG capsule Take 40 mg by mouth daily at 12 noon.    [provider]  gabapentin (NEURONTIN) 400 MG capsule Take 400 mg by mouth at bedtime.     [provider]  montelukast (SINGULAIR) 10 MG tablet Take 1 tablet (10 mg total) by mouth at bedtime. 07/09/19   Johnna Acosta, NP  morphine (MS CONTIN) 15 MG 12 hr tablet Take 15 mg by mouth 2 (two) times daily.     [provider]  ondansetron (ZOFRAN ODT) 4 MG disintegrating tablet Take 1 tablet (4 mg total) by mouth every 8 (eight) hours as needed for nausea or vomiting. 10/04/19   Signa Kell, MD  oxyCODONE (ROXICODONE) 5 MG immediate release tablet Take 1-2 tablets (5-10 mg total) by mouth every 4 (four) hours as needed (pain). 10/04/19 10/03/20  Signa Kell, MD  oxyCODONE-acetaminophen (PERCOCET) 5-325 MG per tablet Take 1 tablet by mouth every 4 (four) hours as needed for moderate pain. For pain    [provider]  promethazine (PHENERGAN) 25 MG tablet Take 25 mg by mouth 2 (two) times daily.    [provider]  rosuvastatin (CRESTOR) 40 MG tablet Take 1 tablet (40 mg  total) by mouth daily. 08/10/19   Kathleene Hazel, MD  sulfamethoxazole-trimethoprim (BACTRIM) 400-80 MG tablet Take 1 tablet by mouth 2 (two) times daily. 10/01/19   Johnna Acosta, NP  tiotropium (SPIRIVA HANDIHALER) 18 MCG inhalation capsule INHALE THE CONTENTS OF 1 CAPSULE AT BEDTIME AS DIRECTED PER PACKAGE INSTRUCTIONS 01/06/20   Johnna Acosta, NP  valsartan (DIOVAN) 160 MG tablet Take 1 tablet (160 mg total) by mouth daily. 08/10/19   Kathleene Hazel, MD  Vitamin D, Ergocalciferol, (DRISDOL) 50000 UNITS CAPS capsule Take 50,000 Units by mouth every Wednesday.  12/15/14   [provider]   Medications at outside hospital IV Unasyn every 6 hours IV Solu-Medrol 40 mg daily Albuterol nebs every 6 hours as needed IV thiamine 100 mg every 24 hours IV Protonix 40 mg daily IV Ativan as needed under CIWA. IV morphine 4 mg every 6 hours for pain IV Zofran 4 mg every 6 hours as needed for nausea and vomiting    Allergies:     Allergies  Allergen Reactions  . Levofloxacin Itching and Rash     Physical Exam:   Vitals  Blood pressure 138/79, pulse 67, temperature 99 F (37.2 C), temperature source Axillary, resp. rate 20, SpO2 95 %.   General: Elderly thin male male lying in bed somnolent, poorly arousable to command, snoring HEENT: Pupils reactive bilaterally, pallor present, no icterus, moist mucosa, supple neck, no cervical adenopathy Chest: Coarse bilateral breath sounds No rhonchi or wheeze CVs: Normal S1-S2, no murmurs rub or gallop GI: Soft, nondistended, nontender, bowel sounds present Musculoskeletal: Warm, no edema CNS: Somnolent but arousable to command, oriented to date and place but unable to provide  much further history.  Moving all extremities, no focal deficit noted.    Data Review:    CBC No results for input(s): WBC, HGB, HCT, PLT, MCV, MCH, MCHC, RDW, LYMPHSABS, MONOABS, EOSABS, BASOSABS, BANDABS in the last 168 hours.  Invalid  input(s): NEUTRABS, BANDSABD ------------------------------------------------------------------------------------------------------------------  Chemistries  No results for input(s): NA, K, CL, CO2, GLUCOSE, BUN, CREATININE, CALCIUM, MG, AST, ALT, ALKPHOS, BILITOT in the last 168 hours.  Invalid input(s): GFRCGP ------------------------------------------------------------------------------------------------------------------ CrCl cannot be calculated (Patient's most recent lab result is older than the maximum 21 days allowed.). ------------------------------------------------------------------------------------------------------------------ No results for input(s): TSH, T4TOTAL, T3FREE, THYROIDAB in the last 72 hours.  Invalid input(s): FREET3  Coagulation profile No results for input(s): INR, PROTIME in the last 168 hours. ------------------------------------------------------------------------------------------------------------------- No results for input(s): DDIMER in the last 72 hours. -------------------------------------------------------------------------------------------------------------------  Cardiac Enzymes No results for input(s): CKMB, TROPONINI, MYOGLOBIN in the last 168 hours.  Invalid input(s): CK ------------------------------------------------------------------------------------------------------------------ No results found for: BNP   ---------------------------------------------------------------------------------------------------------------  Urinalysis No results found for: COLORURINE, APPEARANCEUR, LABSPEC, PHURINE, GLUCOSEU, HGBUR, BILIRUBINUR, KETONESUR, PROTEINUR, UROBILINOGEN, NITRITE, LEUKOCYTESUR  ----------------------------------------------------------------------------------------------------------------   Imaging Results:    No results found.  My personal review of EKG: Pending   Assessment & Plan:    Principal Problem: Sepsis without  acute organ damage (HCC)  Acute respiratory failure with hypoxia (HCC) Secondary to multilobar pneumonia.    Continue IV Unasyn.  Currently maintaining sats on 1 L via nasal cannula.  Patient evaluated this morning at outside hospital for acute pulmonary edema and IV fluids discontinued. Monitor strict I's/O and daily weight.  Remains NPO.  Aspiration precautions. Will place him on gentle hydration with D5 normal saline as patient does not appear to be in volume overload at present.  Check CBC and CMET.  Ordered for COVID-19 routine PCR.  Maintain airborne and contact precaution until results available.  Active Problems: Acute toxic metabolic encephalopathy Likely combination of sepsis secondary to multifocal pneumonia, alcohol withdrawal/opiate withdrawal. Continue stepdown monitoring with high risk for going into full-blown DTs.  Monitor on CIWA.  Ativan as needed for withdrawal symptoms.  Neurochecks per unit protocol.  IV thiamine 1 mg daily.  Add folate and multivitamin once p.o. tolerated. Continue seizure and aspiration precautions.  Acute centrilobular emphysema (HCC) Not on home O2.  Currently maintaining sats on 1 L.  Patient on IV Solu-Medrol with concern for COPD exacerbation from outside hospital.  We will continue on 40 mg IV Solu-Medrol daily. Added nicotine patch.    Gastroesophageal reflux disease Mild drop in H&H from baseline (around 11-12).  Check CBC today.  Continue IV PPI daily    Major depressive disorder, recurrent, in full remission (HCC) On Lexapro at home which is held.    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  Follow EKG.     Normocytic anemia/thrombocytopenia Likely drop in H&H from sepsis.  As per labs from outside hospital, H&H and platelets had started to improve this morning.  Follow repeat CBC    Narcotic dependency, continuous (HCC) For chronic low  back pain.  Patient on oxycodone and morphine as needed at home and suspect opiate withdrawal.  Has been on IV morphine 4 mg every 6 hours at outside hospital which I will continue.  Add bowel regimen (as needed Dulcolax suppository)  Protein calorie malnutrition, unspecified Nutrition consult.  May need NG feeding if remains encephalopathic in the next 24 hours.  DVT Prophylaxis subcu Lovenox  AM Labs Ordered, also please review Full Orders  Family Communication: call sister and left a voice message  Code Status FULL CODE  Likely DC to  Home with HH vs SNF  Condition GUARDED   Consults called: none   Admission status: inaptient Patient presenting with acute respiratory failure with hypoxia in the setting of multifocal pneumonia with associated toxic metabolic encephalopathy likely due to alcohol versus opiate withdrawal.  Patient requiring monitoring in a stepdown setting with high risk for going into progressive respiratory failure/ARDS with his multifocal pneumonia and high aspiration risk.  He also is at high risk for going into full DTs with his alcohol withdrawal symptoms and respiratory compromise with ongoing encephalopathy..  For this he needs to be monitored closely in an inpatient setting for at least >2 midnight.  Time spent in minutes : 60   Kashmir Leedy M.D on 01/11/2020 at 6:06 PM  Between 7am to 7pm - Pager - [phone]. After 7pm go to [email or web address] - password [facility]  [facility] - Office  [phone]

## 2020-01-12 LAB — BASIC METABOLIC PANEL
Anion gap: 8 (ref 5–15)
BUN: 20 mg/dL (ref 8–23)
CO2: 26 mmol/L (ref 22–32)
Calcium: 9 mg/dL (ref 8.9–10.3)
Chloride: 107 mmol/L (ref 98–111)
Creatinine, Ser: 0.79 mg/dL (ref 0.61–1.24)
GFR calc Af Amer: >60 mL/min (ref >60)
GFR calc non Af Amer: >60 mL/min (ref >60)
Glucose, Bld: 127 mg/dL — ABNORMAL HIGH (ref 70–99)
Potassium: 3.6 mmol/L (ref 3.5–5.1)
Sodium: 141 mmol/L (ref 135–145)

## 2020-01-12 LAB — GLUCOSE, CAPILLARY
Glucose-Capillary: 129 mg/dL — ABNORMAL HIGH (ref 70–99)
Glucose-Capillary: 111 mg/dL — ABNORMAL HIGH (ref 70–99)
Glucose-Capillary: 145 mg/dL — ABNORMAL HIGH (ref 70–99)
Glucose-Capillary: 105 mg/dL — ABNORMAL HIGH (ref 70–99)

## 2020-01-12 LAB — CBC
HCT: 29.9 % — ABNORMAL LOW (ref 39.0–52.0)
Hemoglobin: 10 g/dL — ABNORMAL LOW (ref 13.0–17.0)
MCH: 31.9 pg (ref 26.0–34.0)
MCHC: 33.4 g/dL (ref 30.0–36.0)
MCV: 95.5 fL (ref 80.0–100.0)
Platelets: 146 10*3/uL — ABNORMAL LOW (ref 150–400)
RBC: 3.13 MIL/uL — ABNORMAL LOW (ref 4.22–5.81)
RDW: 13.2 % (ref 11.5–15.5)
WBC: 4.8 10*3/uL (ref 4.0–10.5)
nRBC: 0 % (ref 0.0–0.2)

## 2020-01-12 LAB — SARS CORONAVIRUS 2 (TAT 6-24 HRS): SARS Coronavirus 2: NEGATIVE

## 2020-01-12 LAB — AMMONIA: Ammonia: 18 umol/L (ref 9–35)

## 2020-01-12 MED ORDER — ALBUTEROL SULFATE (2.5 MG/3ML) 0.083% IN NEBU: 3.0000 mL | INHALATION_SOLUTION | Freq: Four times a day (QID) | RESPIRATORY_TRACT | Status: DC | PRN

## 2020-01-12 MED ORDER — ALBUTEROL SULFATE (2.5 MG/3ML) 0.083% IN NEBU: 3.0000 mL | INHALATION_SOLUTION | RESPIRATORY_TRACT | Status: DC | PRN

## 2020-01-12 MED ORDER — DOCUSATE SODIUM 50 MG PO CAPS
50.0000 mg | ORAL_CAPSULE | Freq: Two times a day (BID) | ORAL | Status: DC
  Filled 2020-01-12: qty 1

## 2020-01-12 MED ORDER — ESCITALOPRAM OXALATE 10 MG PO TABS
10.0000 mg | ORAL_TABLET | Freq: Every day | ORAL | Status: DC
  Administered 2020-01-12 – 2020-01-14 (×3): 10 mg via ORAL
  Filled 2020-01-12 (×3): qty 1

## 2020-01-12 MED ORDER — IRBESARTAN 150 MG PO TABS
150.0000 mg | ORAL_TABLET | Freq: Every day | ORAL | Status: DC
  Administered 2020-01-12 – 2020-01-14 (×3): 150 mg via ORAL
  Filled 2020-01-12 (×3): qty 1

## 2020-01-12 MED ORDER — VITAMIN D (ERGOCALCIFEROL) 1.25 MG (50000 UNIT) PO CAPS
50000.0000 [IU] | ORAL_CAPSULE | ORAL | Status: DC
  Administered 2020-01-12: 50000 [IU] via ORAL
  Filled 2020-01-12: qty 1

## 2020-01-12 MED ORDER — PREDNISONE 20 MG PO TABS
40.0000 mg | ORAL_TABLET | Freq: Every day | ORAL | Status: DC
  Administered 2020-01-13 – 2020-01-14 (×2): 40 mg via ORAL
  Filled 2020-01-12 (×2): qty 2

## 2020-01-12 MED ORDER — VITAMIN B-12 1000 MCG PO TABS
1000.0000 ug | ORAL_TABLET | Freq: Every day | ORAL | Status: DC
  Administered 2020-01-12 – 2020-01-14 (×3): 1000 ug via ORAL
  Filled 2020-01-12 (×3): qty 1

## 2020-01-12 MED ORDER — DOCUSATE SODIUM 50 MG/5ML PO LIQD
50.0000 mg | Freq: Two times a day (BID) | ORAL | Status: DC
  Administered 2020-01-12 – 2020-01-13 (×3): 50 mg via ORAL
  Filled 2020-01-12 (×6): qty 10

## 2020-01-12 MED ORDER — FOLIC ACID 1 MG PO TABS
1.0000 mg | ORAL_TABLET | Freq: Every day | ORAL | Status: DC
  Administered 2020-01-12 – 2020-01-14 (×3): 1 mg via ORAL
  Filled 2020-01-12 (×3): qty 1

## 2020-01-12 MED ORDER — SODIUM CHLORIDE 0.9 % IV SOLN
3.0000 g | Freq: Four times a day (QID) | INTRAVENOUS | Status: DC
  Administered 2020-01-12 – 2020-01-14 (×8): 3 g via INTRAVENOUS
  Filled 2020-01-12: qty 8
  Filled 2020-01-12 (×3): qty 3
  Filled 2020-01-12: qty 8
  Filled 2020-01-12 (×2): qty 3
  Filled 2020-01-12: qty 8
  Filled 2020-01-12: qty 3
  Filled 2020-01-12: qty 8
  Filled 2020-01-12 (×2): qty 3

## 2020-01-12 MED ORDER — ROSUVASTATIN CALCIUM 20 MG PO TABS
40.0000 mg | ORAL_TABLET | Freq: Every day | ORAL | Status: DC
  Administered 2020-01-12 – 2020-01-14 (×3): 40 mg via ORAL
  Filled 2020-01-12 (×3): qty 2

## 2020-01-12 MED ORDER — TIOTROPIUM BROMIDE MONOHYDRATE 18 MCG IN CAPS
18.0000 ug | ORAL_CAPSULE | Freq: Every day | RESPIRATORY_TRACT | Status: DC
  Administered 2020-01-12 – 2020-01-14 (×3): 18 ug via RESPIRATORY_TRACT
  Filled 2020-01-12: qty 5

## 2020-01-12 MED ORDER — ASPIRIN EC 81 MG PO TBEC
81.0000 mg | DELAYED_RELEASE_TABLET | Freq: Every day | ORAL | Status: DC
  Administered 2020-01-12 – 2020-01-14 (×3): 81 mg via ORAL
  Filled 2020-01-12 (×3): qty 1

## 2020-01-12 MED ORDER — IPRATROPIUM-ALBUTEROL 0.5-2.5 (3) MG/3ML IN SOLN
3.0000 mL | Freq: Four times a day (QID) | RESPIRATORY_TRACT | Status: DC
  Administered 2020-01-12 – 2020-01-13 (×5): 3 mL via RESPIRATORY_TRACT
  Filled 2020-01-12 (×5): qty 3

## 2020-01-12 MED ORDER — ORAL CARE MOUTH RINSE
15.0000 mL | Freq: Two times a day (BID) | OROMUCOSAL | Status: DC
  Administered 2020-01-12 – 2020-01-14 (×3): 15 mL via OROMUCOSAL

## 2020-01-12 NOTE — Progress Notes (Signed)
Patient arrived to floor from ICU. Patient is alert and awake. Breathing is even and unlabored. No distress is noted. All safety measures are in place. Will continue to monitor patient closely.

## 2020-01-12 NOTE — Progress Notes (Signed)
PROGRESS NOTE                                                                                                                                                                                                             Patient Demographics:    [name], is a 74 y.o. male, DOB - 1946-06-24, LKJ:[id number]  Admit date - 01/11/2020   Admitting Physician Eddie North, MD  Outpatient Primary MD for the patient is Barbette Reichmann, MD  LOS - 1   No chief complaint on file.      Brief Narrative   74 year old male with history of CAD s/p remote stent, alcohol abuse with history of withdrawals, ongoing tobacco use, COPD not on home O2, chronic low back pain on oxycodone and morphine, depression admitted to [facility] on 3/27 with sepsis secondary to left lobar pneumonia and acute toxic metabolic encephalopathy suspicious for alcohol and opiate withdrawal.  He remained encephalopathic during the hospital stay and was broadly covered with IV Rocephin and azithromycin then transition to IV Unasyn for worsening respiratory symptoms and concern for ARDS. He developed flash pulmonary edema briefly and given he received most of his care in this area family requested him to be transferred here. Patient admitted to stepdown unit.  His respiratory function was stable during initial presentation and empiric antibiotic continued along with IV steroid.    Subjective:   Patient seen and examined.  He is more awake and oriented to place.  Reports that he drinks very infrequently.   Assessment  & Plan :    Principal Problem:   Lobar pneumonia, unspecified organism (HCC) On empiric IV Unasyn.  (Started since 3/27).  Given severe sepsis and concern for ARDS at outside facility will treat with antibiotic for total 7 days.  Sats stable on 1 L nasal cannula. No signs of fluid overload and getting gentle hydration. Seen by SLP and  recommends he has mild aspiration risk.  Mechanical soft diet with thin liquid. Stable to be transferred to medical floor. PT evaluation  Active Problems: Acute toxic metabolic encephalopathy Combination of sepsis, alcohol withdrawal/opiate withdrawal. More awake on exam this morning and answering few questions.  Continue CIWA.  Currently not in DTs. Continue IV thiamine, add folate and multivitamin. Seizures and aspiration precautions.  Acute centrilobular emphysema (HCC) Not on home O2.  Currently  maintaining sats on 1 L.  No wheezing on exam. Patient on IV Solu-Medrol with concern for COPD exacerbation from outside hospital.    Switch to oral prednisone 40 mg daily.  Resume home inhalers. Added nicotine patch.    Gastroesophageal reflux disease Mild drop in H&H from baseline (around 11-12).  H&H stable.  Continue PPI.    Major depressive disorder, recurrent, in full remission (HCC) Resume home dose Lexapro at low-dose.    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     Normocytic anemia/thrombocytopenia Likely drop in H&H from sepsis.    Compared to labs from outside hospital H&H and platelets are improving.     Narcotic dependency, continuous (HCC) For chronic low back pain.  Patient on oxycodone and morphine as needed at home and suspect opiate withdrawal.  Has been on IV morphine 4 mg every 6 hours at outside hospital which I will continue.    Continue bowel regimen.  We will transition to his home pain regimen once more awake and tolerating p.o.  Protein calorie malnutrition, unspecified Nutrition consult.     Procedure: None Family communication: will call family and update Disposition: PT evaluation.  Further improvement in encephalopathy and respiratory function.  Should be ready for discharge in the next 48 hours if continues to improve.   Lab Results  Component Value Date    PLT 146 (L) 01/12/2020    Antibiotics  :    Anti-infectives (From admission, onward)   Start     Dose/Rate Route Frequency Ordered Stop   01/12/20 0900  Ampicillin-Sulbactam (UNASYN) 3 g in sodium chloride 0.9 % 100 mL IVPB     3 g 200 mL/hr over 30 Minutes Intravenous Every 6 hours 01/12/20 0740          Objective:   Vitals:   01/12/20 0500 01/12/20 0600 01/12/20 0800 01/12/20 0900  BP: (!) 163/65 (!) 152/58 (!) 169/72 (!) 161/79  Pulse: 67 (!) 58 78 68  Resp: 18 17 20 18   Temp:      TempSrc:      SpO2: 95% 95% 92% 96%    Wt Readings from Last 3 Encounters:  01/06/20 72.6 kg  10/04/19 71 kg  09/30/19 71.7 kg     Intake/Output Summary (Last 24 hours) at 01/12/2020 1332 Last data filed at 01/12/2020 0318 Gross per 24 hour  Intake 385.18 ml  Output --  Net 385.18 ml     Physical Exam  Gen: Sleepy but arousable, not in distress HEENT: Moist mucosa, supple neck Chest: Coarse breath sounds over bilateral lung bases CVS: N S1&S2, no murmurs, GI: soft, NT, ND, BS+ Musculoskeletal: warm, no edema CNS: AAOX2, nonfocal    Data Review:    CBC Recent Labs  Lab 01/11/20 1816 01/12/20 0332  WBC 5.5 4.8  HGB 9.4* 10.0*  HCT 28.1* 29.9*  PLT 129* 146*  MCV 94.9 95.5  MCH 31.8 31.9  MCHC 33.5 33.4  RDW 13.2 13.2    Chemistries  Recent Labs  Lab 01/11/20 1816 01/12/20 0332  NA 138 141  K 3.7 3.6  CL 105 107  CO2 24 26  GLUCOSE 108* 127*  BUN 14 20  CREATININE 0.65 0.79  CALCIUM 9.0 9.0  MG 2.1  --   AST 19  --   ALT 17  --   ALKPHOS 68  --   BILITOT 0.5  --    ------------------------------------------------------------------------------------------------------------------ No results  for input(s): CHOL, HDL, LDLCALC, TRIG, CHOLHDL, LDLDIRECT in the last 72 hours.  No results found for: HGBA1C ------------------------------------------------------------------------------------------------------------------ No results for input(s): TSH, T4TOTAL,  T3FREE, THYROIDAB in the last 72 hours.  Invalid input(s): FREET3 ------------------------------------------------------------------------------------------------------------------ No results for input(s): VITAMINB12, FOLATE, FERRITIN, TIBC, IRON, RETICCTPCT in the last 72 hours.  Coagulation profile No results for input(s): INR, PROTIME in the last 168 hours.  No results for input(s): DDIMER in the last 72 hours.  Cardiac Enzymes No results for input(s): CKMB, TROPONINI, MYOGLOBIN in the last 168 hours.  Invalid input(s): CK ------------------------------------------------------------------------------------------------------------------ No results found for: BNP  Inpatient Medications  Scheduled Meds: . enoxaparin (LOVENOX) injection  40 mg Subcutaneous Q24H  . mouth rinse  15 mL Mouth Rinse BID  . methylPREDNISolone (SOLU-MEDROL) injection  40 mg Intravenous Q12H  .  morphine injection  4 mg Intravenous Q6H  . multivitamin with minerals  1 tablet Oral Daily  . nicotine  21 mg Transdermal Daily  . pantoprazole (PROTONIX) IV  40 mg Intravenous Q24H  . sodium chloride flush  3 mL Intravenous Q12H  . thiamine  100 mg Oral Daily   Or  . thiamine  100 mg Intravenous Daily   Continuous Infusions: . sodium chloride    . ampicillin-sulbactam (UNASYN) IV 3 g (01/12/20 0920)  . dextrose 5 % and 0.9% NaCl 50 mL/hr at 01/12/20 0334   PRN Meds:.sodium chloride, acetaminophen **OR** acetaminophen, bisacodyl, ipratropium-albuterol, LORazepam **OR** LORazepam, ondansetron **OR** ondansetron (ZOFRAN) IV, sodium chloride flush  Micro Results Recent Results (from the past 240 hour(s))  MRSA PCR Screening     Status: None   Collection Time: 01/11/20 10:18 PM   Specimen: Nasopharyngeal  Result Value Ref Range Status   MRSA by PCR NEGATIVE NEGATIVE Final    Comment:        The GeneXpert MRSA Assay (FDA approved for NASAL specimens only), is one component of a comprehensive MRSA  colonization surveillance program. It is not intended to diagnose MRSA infection nor to guide or monitor treatment for MRSA infections. Performed at [facility], [address], [place], [place] [address]     Radiology Reports No results found.  Time Spent in minutes  25   Saranya Harlin M.D on 01/12/2020 at 1:32 PM  Between 7am to 7pm - Pager - [phone]  After 7pm go to [email or web address] - password [facility]  [facility] -  Office  [phone]

## 2020-01-12 NOTE — Progress Notes (Signed)
CSW acknowledges consult for follow-up regarding Home Health/DME needs. Please submit PT and OT consults when appropriate. CSW will follow-up based on their recommendations.  CSW will continue to follow.   Alfonso Ramus, [place] [phone]

## 2020-01-12 NOTE — Evaluation (Signed)
Clinical/Bedside Swallow Evaluation Patient Details  Name: MACKLEN WILHOITE MRN: [id number] Date of Birth: 04/10/1946  Today's Date: 01/12/2020 Time: SLP Start Time (ACUTE ONLY): 0945 SLP Stop Time (ACUTE ONLY): 1030 SLP Time Calculation (min) (ACUTE ONLY): 45 min  Past Medical History:  Past Medical History:  Diagnosis Date  . Anemia   . Arthritis   . Back pain    scoliosis  . CAD (coronary artery disease)    a. stent to RCA 1999.b. Last cath 01/2018 stable -> medical therapy.  Marland Kitchen COPD (chronic obstructive pulmonary disease) (HCC)   . Depression   . Diverticulosis   . GERD (gastroesophageal reflux disease)   . GI bleed   . History of colon polyps   . History of hiatal hernia   . Hyperkalemia   . Hyperlipidemia   . Hypertension   . Scoliosis   . Tobacco abuse    Past Surgical History:  Past Surgical History:  Procedure Laterality Date  . APPENDECTOMY  1957  . BACK SURGERY    . CARDIAC CATHETERIZATION  1999   stent placement RCA  . CARDIAC CATHETERIZATION  2010   mild left CA distal stenosis, mild irregularities in RCA and LAD. patent setn in RCA  . COLONOSCOPY WITH PROPOFOL N/A 06/16/2018   Procedure: COLONOSCOPY WITH PROPOFOL;  Surgeon: Toledo, Boykin Nearing, MD;  Location: [facility] ENDOSCOPY;  Service: Gastroenterology;  Laterality: N/A;  . CORONARY ANGIOPLASTY    . CORONARY ARTERY BYPASS GRAFT     denies 09/30/19  . ESOPHAGOGASTRODUODENOSCOPY (EGD) WITH PROPOFOL N/A 06/16/2018   Procedure: ESOPHAGOGASTRODUODENOSCOPY (EGD) WITH PROPOFOL;  Surgeon: Toledo, Boykin Nearing, MD;  Location: [facility] ENDOSCOPY;  Service: Gastroenterology;  Laterality: N/A;  . FOOT SURGERY  1973   left  . LEFT HEART CATH AND CORONARY ANGIOGRAPHY N/A 01/22/2018   Procedure: LEFT HEART CATH AND CORONARY ANGIOGRAPHY;  Surgeon: Kathleene Hazel, MD;  Location: [facility];  Service: Cardiovascular;  Laterality: N/A;  . LEFT HEART CATHETERIZATION WITH CORONARY ANGIOGRAM N/A 01/11/2015   Procedure: LEFT  HEART CATHETERIZATION WITH CORONARY ANGIOGRAM;  Surgeon: Kathleene Hazel, MD;  Location: [facility] CATH LAB;  Service: Cardiovascular;  Laterality: N/A;  . OLECRANON BURSECTOMY Right 10/04/2019   Procedure: OLECRANON BURSA;  Surgeon: Signa Kell, MD;  Location: [facility] ORS;  Service: Orthopedics;  Laterality: Right;  . TRICEPS TENDON REPAIR Right 10/04/2019   Procedure: TRICEPS TENDON REPAIR AND PLECRANON BURSA EXCISION, POSSIBLE ALLOGRAFT AUGMENTATION.;  Surgeon: Signa Kell, MD;  Location: [facility] ORS;  Service: Orthopedics;  Laterality: Right;   HPI:  Pt is a 74 y.o. male, with history of CAD s/p stent in 1999, Alcohol Abuse (drinks 6-7 beers daily with history of withdrawals), ongoing tobacco use, COPD not on home O2, depression, chronic low back pain on oxycodone and morphine who was admitted to [facility] on 3/27 with sepsis secondary to left lobar pneumonia and acute toxic metabolic encephalopathy suspicious for alcohol and opiate withdrawal.  Patient remained encephalopathic during the prior hospital stay with episodes of tachypnea and tachycardia.  He was broadly covered with IV Rocephin and azithromycin then transition to IV Unasyn for his pneumonia.  Patient remained n.p.o. and was given IV fluids but went into flash pulmonary edema requiring high amount of oxygen.  Patient was previously followed by subspecialty here (? PCP/GI/cardiology); family requested patient to be transferred to [facility].  Given concern for ongoing alcohol withdrawal and need for Precedex, patient was accepted to stepdown unit.  CXR: "Chronic interstitial lung disease.  Interstitial markings are more prominent".  Pt denied any swallowing deficits and eats a "regular diet" but is Edentulous baseline.  Congested cough at baseline.    Assessment / Plan / Recommendation Clinical Impression  Pt appears to present w/ grossly adequate oropharyngeal phase swallow function w/ no immediate, overt clinical s/s of  aspiration noted during oral intake. No neuromuscular deficits noted during trials. However, pt was quick to become SOB/WOB w/ the exertion physical tasks including of multiple sips/bites, breath holding during swallowing. Pt was instructed to take single, small sips/bites slowly and to use Rest Breaks b/t trials to lessen any SOB/WOB -- he followed instructions w/ less respiratory impact noted during following trials. Risk for aspiration is reduced when supportive measures are in place. Pt was assisted w/ sitting upright and w/ self-feeding of po trials. He consumed ~8+ ozs of thin liquids then purees/soft solids w/ No immediate, overt clinical s/s of aspiration noted; no wet vocal quality, O2 sats remained 96-97%. Pt did exhibit a congested cough x2 during po trials -- the cough was not immediate to the swallow of a trial, and it was a similar cough as at baseline. Oral phase c/b grossly adequate bolus management and mashing/gumming of soft solids -- given time, pt gummed foods and cleared orally; same slowing down as w/ other consistencies to lessen WOB/SOB. Rest Breaks were encouraged d/t the exertion of the oral intake, mastication. Pt alternated solids w/ moist purees and liquids. OM exam WFL. Pt fed self w/ support. Belching+.  Recommend a more mech soft diet w/ Minced meats consistency d/t Edentulous status and respiratory status currently, thin liquids. Recommend general aspiration precautions, REFLUX precautions, and Rest Breaks during oral intake to lessen WOB/SOB. Recommend Pills in Puree if easier for swallowing. ST services can be available for further education if needed while admitted.  SLP Visit Diagnosis: Dysphagia, unspecified (R13.10)(Edentulous status baseline)    Aspiration Risk  Mild aspiration risk;Risk for inadequate nutrition/hydration(reduced following precautions)    Diet Recommendation  Mech soft diet w/ Minced meats, gravies; Thin liquids. General aspiration and Reflux  precautions. Tray setup at meals, positioning.   Medication Administration: Whole meds with puree(if needed for safer swallowing)    Other  Recommendations Recommended Consults: (dietician f/u) Oral Care Recommendations: Oral care BID;Oral care before and after PO;Staff/trained caregiver to provide oral care Other Recommendations: (n/a)   Follow up Recommendations None      Frequency and Duration min 2x/week  1 week       Prognosis Prognosis for Safe Diet Advancement: Fair Barriers to Reach Goals: Time post onset;Severity of deficits(Pulmonary status baseline) Barriers/Prognosis Comment: Edentulous baseline       Swallow Study   General Date of Onset: 01/11/20 HPI: Pt is a 74 y.o. male, with history of CAD s/p stent in 1999, Alcohol Abuse (drinks 6-7 beers daily with history of withdrawals), ongoing tobacco use, COPD not on home O2, depression, chronic low back pain on oxycodone and morphine who was admitted to [facility] on 3/27 with sepsis secondary to left lobar pneumonia and acute toxic metabolic encephalopathy suspicious for alcohol and opiate withdrawal.  Patient remained encephalopathic during the prior hospital stay with episodes of tachypnea and tachycardia.  He was broadly covered with IV Rocephin and azithromycin then transition to IV Unasyn for his pneumonia.  Patient remained n.p.o. and was given IV fluids but went into flash pulmonary edema requiring high amount of oxygen.  Patient was previously followed by subspecialty here (? PCP/GI/cardiology); family requested patient  to be transferred to [facility].  Given concern for ongoing alcohol withdrawal and need for Precedex, patient was accepted to stepdown unit.  CXR: "Chronic interstitial lung disease. Interstitial markings are more prominent".  Pt denied any swallowing deficits and eats a "regular diet" but is Edentulous baseline.  Congested cough at baseline.  Type of Study: Bedside Swallow  Evaluation Previous Swallow Assessment: none Diet Prior to this Study: NPO(regular diet at home) Temperature Spikes Noted: No(wbc 4.8) Respiratory Status: Nasal cannula(2L) History of Recent Intubation: No Behavior/Cognition: Alert;Cooperative;Pleasant mood;Distractible;Requires cueing Oral Cavity Assessment: Dry(min) Oral Care Completed by SLP: Yes Oral Cavity - Dentition: Edentulous Vision: Functional for self-feeding Self-Feeding Abilities: Able to feed self;Needs assist;Needs set up Patient Positioning: Upright in bed(needed min positioning) Baseline Vocal Quality: Normal Volitional Cough: Strong;Congested Volitional Swallow: Able to elicit    Oral/Motor/Sensory Function Overall Oral Motor/Sensory Function: Within functional limits   Ice Chips Ice chips: Within functional limits Presentation: Spoon(fed; 3 trials)   Thin Liquid Thin Liquid: Within functional limits(grossly) Presentation: Cup;Self Fed;Straw(~8+ozs) Other Comments: min easy to become SOB/WOB w/ the exertion of multiple sips, breath holding. Pt was instructed to take single, small sips slowly to lessen any SOB/WOB -- he followed instructions w/ less respiratory impact noted during following trials.    Nectar Thick Nectar Thick Liquid: Not tested   Honey Thick Honey Thick Liquid: Not tested   Puree Puree: Within functional limits(grossly) Presentation: Self Fed;Spoon(10 trials) Other Comments: instructed to slow down, take smaller bites - rest breaks b/t trials to lessen SOB/WOB   Solid     Solid: Impaired(min) Presentation: Spoon;Self Fed(8 trials) Oral Phase Impairments: Impaired mastication(edentulous) Oral Phase Functional Implications: Impaired mastication(edentulous) Pharyngeal Phase Impairments: (none immediately following trials) Other Comments: given time, pt gummed foods and cleared orally; same slowing down as w/ other consistencies to lessen WOB/SOB       Orinda Kenner, MS,  CCC-SLP Kayleb Warshaw 01/12/2020,12:55 PM

## 2020-01-12 NOTE — Progress Notes (Signed)
PT Cancellation Note  Patient Details Name: [name] MRN: [id number] DOB: 03-04-1946   Cancelled Treatment:    Reason Eval/Treat Not Completed: Patient at procedure or test/unavailable(2 attempts to see patient this date, both times busy with nursing or other. Will attempt again at later date/time.)  3:50 PM, 01/12/20 Rosamaria Lints, PT, DPT Physical Therapist - [facility]  [phone] (ASCOM)   [name] 01/12/2020, 3:50 PM

## 2020-01-13 MED ORDER — GENERIC EXTERNAL MEDICATION
Status: DC
Start: ? — End: 2020-01-13

## 2020-01-13 MED ORDER — IPRATROPIUM-ALBUTEROL 0.5-2.5 (3) MG/3ML IN SOLN
3.0000 mL | Freq: Three times a day (TID) | RESPIRATORY_TRACT | Status: DC
  Administered 2020-01-14: 3 mL via RESPIRATORY_TRACT
  Filled 2020-01-13: qty 3

## 2020-01-13 MED ORDER — MORPHINE SULFATE ER 15 MG PO TBCR
15.0000 mg | EXTENDED_RELEASE_TABLET | Freq: Two times a day (BID) | ORAL | Status: DC
  Administered 2020-01-13 – 2020-01-14 (×2): 15 mg via ORAL
  Filled 2020-01-13 (×2): qty 1

## 2020-01-13 MED ORDER — OXYCODONE-ACETAMINOPHEN 5-325 MG PO TABS
1.0000 | ORAL_TABLET | ORAL | Status: DC | PRN
  Administered 2020-01-13 – 2020-01-14 (×3): 1 via ORAL
  Filled 2020-01-13 (×3): qty 1

## 2020-01-13 MED ORDER — SACCHAROMYCES BOULARDII 250 MG PO CAPS
250.0000 mg | ORAL_CAPSULE | Freq: Two times a day (BID) | ORAL | Status: DC
  Administered 2020-01-13 – 2020-01-14 (×2): 250 mg via ORAL
  Filled 2020-01-13 (×3): qty 1

## 2020-01-13 NOTE — Progress Notes (Addendum)
   01/13/20 0935  Therapy Vitals  Patient Position (if appropriate) Orthostatic Vitals  Orthostatic Lying   BP- Lying 162/85  Orthostatic Sitting  BP- Sitting 125/73 (179/68 seated x 2 minutes)  Pulse- Sitting 80  Orthostatic Standing at 0 minutes  BP- Standing at 0 minutes 130/63 (no symptoms when checked, but was previously queasy after 38ft AMB)  Pulse- Standing at 0 minutes 86   Pt noted to be queasy after 43ft AMB in room, no other symptoms. Pt subsequently noted to be orthostatic.   11:41 AM, 01/13/20 Rosamaria Lints, PT, DPT Physical Therapist - [facility]  [phone] [facility])

## 2020-01-13 NOTE — Evaluation (Signed)
Physical Therapy Evaluation Patient Details Name: [name] MRN: [id number] DOB: 1946-07-13 Today's Date: 01/13/2020   History of Present Illness  [name] is a 73yoM who comes to [facility] on 3/30 direct transfer from [facility] for PNA. admitted to [facility] on 3/27 with sepsis secondary to left lobar pneumonia and acute toxic metabolic encephalopathy suspicious for alcohol and opiate withdrawal. CAD s/p stent in 1999, alcohol abuse (drinks 6-7 beers daily with history of withdrawals), ongoing tobacco use, COPD not on home O2, depression, chronic low back pain s/p multilevel fusion on oxycodone and morphine.  Clinical Impression  Pt admitted with above diagnosis. Pt currently with functional limitations due to the deficits listed below (see "PT Problem List"). Upon entry, pt in bed, awake and agreeable to participate. The pt is alert and oriented x3, pleasant, conversational, and generally a good historian. Supervision for bed mobility and transfers, pt well aware of acute deficits in balance/strength, asks to use RW. Pt able to AMB in room 61ft with RW at supervision level, no LOB or gross unsteadiness on feet. Pt has post AMB queasiness which leads to orthostatic BP assess, noted to have sharp SBP drops of 40-70mm Hg and sharp rebounds. No other symptoms or signs associated with orthostatic intolerance except for some increased drowsiness. Queasiness resolves after 2 minutes supine, then pt agreeable to move to recliner, where he is made comfortable. Upon return later, pt reports only tolerating 10 minutes in recliner as it aggravates his post surgical chronic low back pain. Functional mobility assessment demonstrates increased effort/time requirements, poor tolerance, but no need for physical assistance, whereas the patient performed these at a higher level of independence PTA. Author recommends close monitoring for OOB mobility at DC via family due to orthostatic intolerance issues,  suspect may become worse (more symptomatic) with advent of antihypertensives. Pt will benefit from skilled PT intervention to increase independence and safety with basic mobility in preparation for discharge to the venue listed below.       Follow Up Recommendations Home health PT;Supervision for mobility/OOB    Equipment Recommendations  None recommended by PT(Pt reports he already has a RW at home.)    Recommendations for Other Services       Precautions / Restrictions Precautions Precautions: Fall Restrictions Weight Bearing Restrictions: No      Mobility  Bed Mobility Overal bed mobility: Modified Independent                Transfers Overall transfer level: Modified independent Equipment used: Rolling walker (2 wheeled)             General transfer comment: VC for safe use of RW  Ambulation/Gait Ambulation/Gait assistance: Min Emergency planning/management officer (Feet): 60 Feet Assistive device: Rolling walker (2 wheeled)       General Gait Details: requests a RW due to acute imbalance; appears steady and safe with RW; terminates AMB d/t decreased motivation to AMB (no clear symptoms, fatigue) but then later becomes queasy at EOB after 2 minutes. Orthos taken noted severe SBP drops with positional changes, some rebound between, largely hypertensive.  Stairs            Wheelchair Mobility    Modified Rankin (Stroke Patients Only)       Balance Overall balance assessment: Modified Independent;No apparent balance deficits (not formally assessed)  Pertinent Vitals/Pain Pain Assessment: No/denies pain    Home Living Family/patient expects to be discharged to:: Private residence Living Arrangements: Alone Available Help at Discharge: Family(DTRs plan to help a bunch at DC) Type of Home: House Home Access: Stairs to enter Entrance Stairs-Rails: Right Entrance Stairs-Number of Steps: 2  partial steps Home Layout: One level Home Equipment: Walker - 2 wheels;Wheelchair - manual      Prior Function Level of Independence: Independent         Comments: previously a Art gallery manager, driving performing IADL independently.     Hand Dominance        Extremity/Trunk Assessment   Upper Extremity Assessment Upper Extremity Assessment: Overall WFL for tasks assessed    Lower Extremity Assessment Lower Extremity Assessment: Overall WFL for tasks assessed;Generalized weakness       Communication      Cognition Arousal/Alertness: Awake/alert Behavior During Therapy: WFL for tasks assessed/performed Overall Cognitive Status: Within Functional Limits for tasks assessed                                        General Comments      Exercises     Assessment/Plan    PT Assessment Patient needs continued PT services  PT Problem List Decreased strength;Decreased activity tolerance;Decreased balance;Decreased mobility       PT Treatment Interventions DME instruction;Balance training;Gait training;Functional mobility training;Therapeutic activities;Therapeutic exercise;Patient/family education    PT Goals (Current goals can be found in the Care Plan section)  Acute Rehab PT Goals Patient Stated Goal: return to home, regain strength PT Goal Formulation: With patient Time For Goal Achievement: 01/27/20 Potential to Achieve Goals: Fair    Frequency Min 2X/week   Barriers to discharge        Co-evaluation               AM-PAC PT "6 Clicks" Mobility  Outcome Measure Help needed turning from your back to your side while in a flat bed without using bedrails?: A Little Help needed moving from lying on your back to sitting on the side of a flat bed without using bedrails?: A Little Help needed moving to and from a bed to a chair (including a wheelchair)?: A Little Help needed standing up from a chair using your arms (e.g., wheelchair or  bedside chair)?: A Little Help needed to walk in hospital room?: A Little Help needed climbing 3-5 steps with a railing? : A Little 6 Click Score: 18    End of Session Equipment Utilized During Treatment: Gait belt;Oxygen Activity Tolerance: Patient tolerated treatment well;Treatment limited secondary to medical complications (Comment)(orthostatic intolerance) Patient left: in chair;with call bell/phone within reach;with chair alarm set Nurse Communication: Mobility status PT Visit Diagnosis: Unsteadiness on feet (R26.81);Other abnormalities of gait and mobility (R26.89);Difficulty in walking, not elsewhere classified (R26.2)    Time: 2376-2831 PT Time Calculation (min) (ACUTE ONLY): 38 min   Charges:   PT Evaluation $PT Eval Moderate Complexity: 1 Mod PT Treatments $Therapeutic Exercise: 8-22 mins        11:39 AM, 01/13/20 Etta Grandchild, PT, DPT Physical Therapist -  [facility]  [phone] ([place])   [place] C 01/13/2020, 11:33 AM

## 2020-01-13 NOTE — Progress Notes (Signed)
PROGRESS NOTE                                                                                                                                                                                                             Patient Demographics:    [name], is a 74 y.o. male, DOB - 1946-09-09, OXB:[id number]  Admit date - 01/11/2020   Admitting Physician Louellen Molder, MD  Outpatient Primary MD for the patient is Tracie Harrier, MD  LOS - 2   No chief complaint on file.      Brief Narrative   74 year old male with history of CAD s/p remote stent, alcohol abuse with history of withdrawals, ongoing tobacco use, COPD not on home O2, chronic low back pain on oxycodone and morphine, depression admitted to [facility] on 3/27 with sepsis secondary to left lobar pneumonia and acute toxic metabolic encephalopathy suspicious for alcohol and opiate withdrawal.  He remained encephalopathic during the hospital stay and was broadly covered with IV Rocephin and azithromycin then transition to IV Unasyn for worsening respiratory symptoms and concern for ARDS. He developed flash pulmonary edema briefly and given he received most of his care in this area family requested him to be transferred here. Patient admitted to stepdown unit.  His respiratory function was stable during initial presentation and empiric antibiotic continued along with IV steroid.    Subjective:      Assessment  & Plan :    Principal Problem:   Lobar pneumonia, unspecified organism ([place]) On empiric IV Unasyn.  (Started since 3/27) given severe sepsis and concern for ARDS at outside facility will treat with antibiotic for total 7 days.  sats stable on RA. Switch abx to augmentin po, Seen by SLP and recommends he has mild aspiration risk.  Mechanical soft diet with thin liquid. Stable to be transferred to medical floor. PT recommends HH with supervision.    Active Problems: Acute toxic metabolic encephalopathy Combination of sepsis, alcohol withdrawal/opiate withdrawal. Currently resolved.  Continue thiamine, add folate and multivitamin. Transition to home pain meds   Acute centrilobular emphysema (HCC) Currently stable.  Continue oral prednisone, home inhalers and nicotine patch.    Gastroesophageal reflux disease Mild drop in H&H from baseline (around 11-12).  H&H stable.  Continue PPI.    Major depressive disorder, recurrent, in full remission ([place]) Continue  home dose Lexapro.    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     Normocytic anemia/thrombocytopenia Likely drop in H&H from sepsis.    Currently stable.     Narcotic dependency, continuous (HCC) For chronic low back pain.    Suspected opiate withdrawal.  Was getting IV morphine scheduled here while patient encephalopathic.   Protein calorie malnutrition, unspecified Nutrition consult appreciated.    Procedure: None Family communication: Unable to reach patient's daughter and sister on the phone. Disposition: PT recommends home health.   Lab Results  Component Value Date   PLT 146 (L) 01/12/2020    Antibiotics  :    Anti-infectives (From admission, onward)   Start     Dose/Rate Route Frequency Ordered Stop   01/12/20 0900  Ampicillin-Sulbactam (UNASYN) 3 g in sodium chloride 0.9 % 100 mL IVPB     3 g 200 mL/hr over 30 Minutes Intravenous Every 6 hours 01/12/20 0740          Objective:   Vitals:   01/12/20 2335 01/13/20 0008 01/13/20 0742 01/13/20 0935  BP: (!) 162/133 (!) 149/79 (!) 164/80   Pulse:  73 70   Resp: 18  18   Temp: 98.5 F (36.9 C)  98.1 F (36.7 C)   TempSrc: Oral  Oral   SpO2:   96% 93%  Weight:      Height:        Wt Readings from Last 3 Encounters:  01/12/20 67.4 kg  01/06/20 72.6 kg  10/04/19 71 kg     Intake/Output Summary  (Last 24 hours) at 01/13/2020 1344 Last data filed at 01/13/2020 0321 Gross per 24 hour  Intake 407.76 ml  Output 650 ml  Net -242.24 ml   Physical exam Not in distress, awake and alert HEENT: Moist mucosa, supple neck Chest: Improved bibasilar breath sound CVs: Normal S1-S2 GI: Soft, nondistended, nontender Musculoskeletal: Warm, no edema CNS: Alert and oriented     Data Review:    CBC Recent Labs  Lab 01/11/20 1816 01/12/20 0332  WBC 5.5 4.8  HGB 9.4* 10.0*  HCT 28.1* 29.9*  PLT 129* 146*  MCV 94.9 95.5  MCH 31.8 31.9  MCHC 33.5 33.4  RDW 13.2 13.2    Chemistries  Recent Labs  Lab 01/11/20 1816 01/12/20 0332  NA 138 141  K 3.7 3.6  CL 105 107  CO2 24 26  GLUCOSE 108* 127*  BUN 14 20  CREATININE 0.65 0.79  CALCIUM 9.0 9.0  MG 2.1  --   AST 19  --   ALT 17  --   ALKPHOS 68  --   BILITOT 0.5  --    ------------------------------------------------------------------------------------------------------------------ No results for input(s): CHOL, HDL, LDLCALC, TRIG, CHOLHDL, LDLDIRECT in the last 72 hours.  No results found for: HGBA1C ------------------------------------------------------------------------------------------------------------------ No results for input(s): TSH, T4TOTAL, T3FREE, THYROIDAB in the last 72 hours.  Invalid input(s): FREET3 ------------------------------------------------------------------------------------------------------------------ No results for input(s): VITAMINB12, FOLATE, FERRITIN, TIBC, IRON, RETICCTPCT in the last 72 hours.  Coagulation profile No results for input(s): INR, PROTIME in the last 168 hours.  No results for input(s): DDIMER in the last 72 hours.  Cardiac Enzymes No results for input(s): CKMB, TROPONINI, MYOGLOBIN in the last 168 hours.  Invalid input(s): CK ------------------------------------------------------------------------------------------------------------------ No results found for: BNP   Inpatient Medications  Scheduled Meds: . aspirin EC  81 mg Oral Daily  . docusate  50 mg Oral  BID  . enoxaparin (LOVENOX) injection  40 mg Subcutaneous Q24H  . escitalopram  10 mg Oral Daily  . folic acid  1 mg Oral Daily  . ipratropium-albuterol  3 mL Nebulization Q6H  . irbesartan  150 mg Oral Daily  . mouth rinse  15 mL Mouth Rinse BID  .  morphine injection  4 mg Intravenous Q6H  . multivitamin with minerals  1 tablet Oral Daily  . nicotine  21 mg Transdermal Daily  . pantoprazole (PROTONIX) IV  40 mg Intravenous Q24H  . predniSONE  40 mg Oral Q breakfast  . rosuvastatin  40 mg Oral Daily  . sodium chloride flush  3 mL Intravenous Q12H  . thiamine  100 mg Oral Daily   Or  . thiamine  100 mg Intravenous Daily  . tiotropium  18 mcg Inhalation Daily  . vitamin B-12  1,000 mcg Oral Daily  . Vitamin D (Ergocalciferol)  50,000 Units Oral Q Wed   Continuous Infusions: . sodium chloride Stopped (01/13/20 0425)  . ampicillin-sulbactam (UNASYN) IV 3 g (01/13/20 0321)   PRN Meds:.sodium chloride, acetaminophen **OR** acetaminophen, albuterol, bisacodyl, LORazepam **OR** LORazepam, ondansetron **OR** ondansetron (ZOFRAN) IV, sodium chloride flush  Micro Results Recent Results (from the past 240 hour(s))  MRSA PCR Screening     Status: None   Collection Time: 01/11/20 10:18 PM   Specimen: Nasopharyngeal  Result Value Ref Range Status   MRSA by PCR NEGATIVE NEGATIVE Final    Comment:        The GeneXpert MRSA Assay (FDA approved for NASAL specimens only), is one component of a comprehensive MRSA colonization surveillance program. It is not intended to diagnose MRSA infection nor to guide or monitor treatment for MRSA infections. Performed at [facility], [address] [place], [place] [address]   SARS CORONAVIRUS 2 (TAT 6-24 HRS) Nasopharyngeal Nasopharyngeal Swab     Status: None   Collection Time: 01/12/20  9:25 AM   Specimen: Nasopharyngeal Swab  Result  Value Ref Range Status   SARS Coronavirus 2 NEGATIVE NEGATIVE Final    Comment: (NOTE) SARS-CoV-2 target nucleic acids are NOT DETECTED. The SARS-CoV-2 RNA is generally detectable in upper and lower respiratory specimens during the acute phase of infection. Negative results do not preclude SARS-CoV-2 infection, do not rule out co-infections with other pathogens, and should not be used as the sole basis for treatment or other patient management decisions. Negative results must be combined with clinical observations, patient history, and epidemiological information. The expected result is Negative. Fact Sheet for Patients: HairSlick.no Fact Sheet for Healthcare Providers: quierodirigir.com This test is not yet approved or cleared by the [place] FDA and  has been authorized for detection and/or diagnosis of SARS-CoV-2 by FDA under an Emergency Use Authorization (EUA). This EUA will remain  in effect (meaning this test can be used) for the duration of the COVID-19 declaration under Section 56 4(b)(1) of the Act, 21 U.S.C. section 360bbb-3(b)(1), unless the authorization is terminated or revoked sooner. Performed at [facility], [address] [place], [place] [address]     Radiology Reports No results found.  Time Spent in minutes  25   Alila Sotero M.D on 01/13/2020 at 1:44 PM  Between 7am to 7pm - Pager - [phone]  After 7pm go to [email or web address] - password [email or web address]  [facility] -  Office  [phone]

## 2020-01-14 DIAGNOSIS — J432 Centrilobular emphysema: Secondary | ICD-10-CM

## 2020-01-14 DIAGNOSIS — J810 Acute pulmonary edema: Secondary | ICD-10-CM

## 2020-01-14 MED ORDER — DOCUSATE SODIUM 50 MG PO CAPS: 50.0000 mg | ORAL_CAPSULE | Freq: Every day | ORAL | 0 refills | Status: DC | PRN

## 2020-01-14 MED ORDER — LOPERAMIDE HCL 2 MG PO TABS: 2.0000 mg | ORAL_TABLET | Freq: Four times a day (QID) | ORAL | 0 refills | Status: DC | PRN

## 2020-01-14 NOTE — TOC Transition Note (Signed)
Transition of Care [facility]) - CM/SW Discharge Note   Patient Details  Name: [name] MRN: [id number] Date of Birth: 1946-03-12  Transition of Care [facility]) CM/SW Contact:  [place] Cellar, RN Phone Number: 01/14/2020, 12:43 PM   Clinical Narrative:     Discharging home with [facility].         Patient Goals and CMS Choice        Discharge Placement                       Discharge Plan and Services                                     Social Determinants of Health (SDOH) Interventions     Readmission Risk Interventions No flowsheet data found.

## 2020-01-14 NOTE — Care Management Important Message (Signed)
Important Message  Patient Details  Name: [name] MRN: [id number] Date of Birth: 04-17-1946   Medicare Important Message Given:  Yes     Olegario Messier A [name] 01/14/2020, 11:55 AM

## 2020-01-14 NOTE — TOC Progression Note (Signed)
Transition of Care [facility]) - Progression Note    Patient Details  Name: [name] MRN: [id number] Date of Birth: 1945-12-02  Transition of Care [facility]) CM/SW Contact  [place] Cellar, RN Phone Number: 01/14/2020, 1:09 PM  Clinical Narrative:    Attempted to contact daughter @ [phone] to update on HHC-number is no longer a working number. Confirmed with nurse patient already left hospital. Left VM on patients number updating to expect call from [facility].         Expected Discharge Plan and Services           Expected Discharge Date: 01/14/20                                     Social Determinants of Health (SDOH) Interventions    Readmission Risk Interventions No flowsheet data found.

## 2020-01-14 NOTE — Discharge Summary (Addendum)
Physician Discharge Summary  KENDAL RAFFO YSA:[id number] DOB: June 27, 1946 DOA: 01/11/2020  PCP: Barbette Reichmann, MD  Admit date: 01/11/2020 Discharge date: 01/14/2020  Admitted From: Home Disposition: Home  Recommendations for Outpatient Follow-up:  Follow-up with PCP in 1 week.  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) Acute delirium, suspected alcohol related   Lobar pneumonia, unspecified organism (HCC)   Acute pulmonary edema (HCC)   Toxic metabolic encephalopathy   Normocytic anemia   Narcotic dependency, continuous (HCC)   Brief narrative/HPI 74 year old male with history of CAD s/p remote stent, alcohol abuse with history of withdrawals, ongoing tobacco use, COPD not on home O2, chronic low back pain on oxycodone and morphine, depression admitted to [facility] on 3/27 with sepsis secondary to left lobar pneumonia and acute toxic metabolic encephalopathy suspicious for alcohol and opiate withdrawal.  He remained encephalopathic during the hospital stay and was broadly covered with IV Rocephin and azithromycin then transition to IV Unasyn for worsening respiratory symptoms and concern for ARDS. He developed flash pulmonary edema briefly and given he received most of his care in this area family requested him to be transferred here. Patient admitted to stepdown unit.  His respiratory function was stable during initial presentation and empiric antibiotic continued along with IV steroid.  Hospital  course  Principal Problem: Acute respiratory failure with hypoxia (HCC)   Lobar multifocal pneumonia, unspecified organism (HCC) Received 7 days of IV Unasyn since outside hospital.  Concern for severe sepsis at outside hospital however upon arrival here patient was not septic.  Patient stable and maintaining O2 sat on room air.  Minimal aspiration risk per SLP evaluation however it is inconclusive that pneumonia is contributed by or  directly associated with aspiration.    Active Problems: Acute toxic metabolic encephalopathy Combination of sepsis at outside facility,?  Alcohol withdrawal/opiate withdrawal. Resolved.  Both patient and his daughter informed that he has not had more than 2 drinks in the past 1 month.   Home pain medication (morphine twice daily scheduled and Percocet as needed) resumed.  Per both patient and daughter he did not overdose on the morphine and takes Percocet less frequently than instructed.   Acute centrilobular emphysema (HCC) Stable.  Was on IV steroid at outside hospital with concern for COPD exacerbation.  Has been stable here on oral prednisone.  Continue home inhaler.  Reports he no longer smokes.   Gastroesophageal reflux disease Had mild drop in H&H from baseline but stable.  Continue PPI  ?  Diarrhea Daughter reports that patient has frequent watery stools at home.  Noted to be on scheduled Colace twice daily.  Switch to as needed.  Colonoscopy done in 2019 showed diverticulosis.  Ordered as needed Lomotil.  Is more likely to get constipated while on narcotic.  Major depressive disorder, recurrent, in full remission (HCC) Continue home dose Lexapro.  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   Normocytic anemia/thrombocytopenia Likely associated with sepsis.  Improved.   Narcotic dependency,  continuous ([place]) For chronic low back pain.      Suspicion for opiate withdrawal.  Was getting scheduled IV morphine while patient was n.p.o.  Now transition to his home oral pain regimen.  Instructed to monitor his pain medication intake.   Protein calorie malnutrition, unspecified Generalized weakness Outpatient nutrition evaluation.  PT recommends home health.  Instructed to use his rolling walker at all times.   Procedure: None Family communication:  Discussed with daughter on the phone Disposition: Home with home health  Discharge Instructions   Allergies as of 01/14/2020      Reactions   Levofloxacin Itching, Rash      Medication List    STOP taking these medications   ondansetron 4 MG disintegrating tablet Commonly known as: Zofran ODT   oxyCODONE 5 MG immediate release tablet Commonly known as: Roxicodone   sulfamethoxazole-trimethoprim 400-80 MG tablet Commonly known as: Bactrim     TAKE these medications   acetaminophen 500 MG tablet Commonly known as: TYLENOL Take 2 tablets (1,000 mg total) by mouth every 8 (eight) hours.   albuterol 108 (90 Base) MCG/ACT inhaler Commonly known as: VENTOLIN HFA Inhale 2 puffs into the lungs every 6 (six) hours as needed for wheezing or shortness of breath.   aspirin EC 81 MG tablet Take 1 tablet (81 mg total) by mouth daily.   docusate sodium 50 MG capsule Commonly known as: COLACE Take 1 capsule (50 mg total) by mouth daily as needed for mild constipation. What changed:   when to take this  reasons to take this   escitalopram 20 MG tablet Commonly known as: LEXAPRO Take 1 tablet (20 mg total) by mouth daily.   esomeprazole 20 MG capsule Commonly known as: NEXIUM Take 40 mg by mouth daily at 12 noon.   gabapentin 400 MG capsule Commonly known as: NEURONTIN Take 400 mg by mouth at bedtime.   loperamide 2 MG tablet Commonly known as: Imodium A-D Take 1 tablet (2 mg total) by mouth 4 (four) times daily as needed for diarrhea or loose stools.    montelukast 10 MG tablet Commonly known as: SINGULAIR Take 1 tablet (10 mg total) by mouth at bedtime.   morphine 15 MG 12 hr tablet Commonly known as: MS CONTIN Take 15 mg by mouth 2 (two) times daily.   oxyCODONE-acetaminophen 5-325 MG tablet Commonly known as: PERCOCET/ROXICET Take 1 tablet by mouth every 4 (four) hours as needed for moderate pain. For pain   promethazine 25 MG tablet Commonly known as: PHENERGAN Take 25 mg by mouth 2 (two) times daily.   rosuvastatin 40 MG tablet Commonly known as: CRESTOR Take 1 tablet (40 mg total) by mouth daily.   Spiriva HandiHaler 18 MCG inhalation capsule Generic drug: tiotropium INHALE THE CONTENTS OF 1 CAPSULE AT BEDTIME AS DIRECTED PER PACKAGE INSTRUCTIONS   valsartan 160 MG tablet Commonly known as: Diovan Take 1 tablet (160 mg total) by mouth daily.   vitamin B-12 500 MCG tablet Commonly known as: CYANOCOBALAMIN Take 500 mcg by mouth 2 (two) times daily. VITAMIN B-12   Vitamin D (Ergocalciferol) 1.25 MG (50000 UNIT) Caps capsule Commonly known as: DRISDOL Take 50,000 Units by mouth every Wednesday.      Follow-up Information    Tracie Harrier, MD. Schedule an appointment as soon as possible for a visit in 1 week(s).   Specialty: Internal Medicine Contact information: [facility] [place] [address] [phone]  Allergies  Allergen Reactions  . Levofloxacin Itching and Rash       Procedures/Studies:  No results found.    Subjective: Feels better.  No overnight issues.  Discharge Exam: Vitals:   01/14/20 0238 01/14/20 0823  BP: (!) 169/83 (!) 147/67  Pulse: 64 66  Resp:  18  Temp: 98 F (36.7 C) 98.9 F (37.2 C)  SpO2: 94% 92%   Vitals:   01/13/20 1548 01/14/20 0011 01/14/20 0238 01/14/20 0823  BP: 127/70 (!) 182/82 (!) 169/83 (!) 147/67  Pulse: 73 60 64 66  Resp: 15 18  18   Temp: 98.6 F (37 C) 97.6 F (36.4 C) 98 F (36.7  C) 98.9 F (37.2 C)  TempSrc: Oral Oral Oral Oral  SpO2: 91% 96% 94% 92%  Weight:      Height:        General: Elderly male not in distress HEENT: Moist mucosa, supple neck Chest: Clear bilaterally CVs: Normal S1 and S2, no murmurs GI: Soft, nondistended, nontender Musculoskeletal: Warm, no edema    The results of significant diagnostics from this hospitalization (including imaging, microbiology, ancillary and laboratory) are listed below for reference.     Microbiology: Recent Results (from the past 240 hour(s))  MRSA PCR Screening     Status: None   Collection Time: 01/11/20 10:18 PM   Specimen: Nasopharyngeal  Result Value Ref Range Status   MRSA by PCR NEGATIVE NEGATIVE Final    Comment:        The GeneXpert MRSA Assay (FDA approved for NASAL specimens only), is one component of a comprehensive MRSA colonization surveillance program. It is not intended to diagnose MRSA infection nor to guide or monitor treatment for MRSA infections. Performed at [facility], [address] [place], [place]   SARS CORONAVIRUS 2 (TAT 6-24 HRS) Nasopharyngeal Nasopharyngeal Swab     Status: None   Collection Time: 01/12/20  9:25 AM   Specimen: Nasopharyngeal Swab  Result Value Ref Range Status   SARS Coronavirus 2 NEGATIVE NEGATIVE Final    Comment: (NOTE) SARS-CoV-2 target nucleic acids are NOT DETECTED. The SARS-CoV-2 RNA is generally detectable in upper and lower respiratory specimens during the acute phase of infection. Negative results do not preclude SARS-CoV-2 infection, do not rule out co-infections with other pathogens, and should not be used as the sole basis for treatment or other patient management decisions. Negative results must be combined with clinical observations, patient history, and epidemiological information. The expected result is Negative. Fact Sheet for Patients: 01/14/20 Fact Sheet for Healthcare  Providers: HairSlick.no This test is not yet approved or cleared by the [place] FDA and  has been authorized for detection and/or diagnosis of SARS-CoV-2 by FDA under an Emergency Use Authorization (EUA). This EUA will remain  in effect (meaning this test can be used) for the duration of the COVID-19 declaration under Section 56 4(b)(1) of the Act, 21 U.S.C. section 360bbb-3(b)(1), unless the authorization is terminated or revoked sooner. Performed at [facility], [address], [place], [place]      Labs: BNP (last 3 results) No results for input(s): BNP in the last 8760 hours. Basic Metabolic Panel: Recent Labs  Lab 01/11/20 1816 01/12/20 0332  NA 138 141  K 3.7 3.6  CL 105 107  CO2 24 26  GLUCOSE 108* 127*  BUN 14 20  CREATININE 0.65 0.79  CALCIUM 9.0 9.0  MG 2.1  --   PHOS 3.1  --  Liver Function Tests: Recent Labs  Lab 01/11/20 1816  AST 19  ALT 17  ALKPHOS 68  BILITOT 0.5  PROT 6.8  ALBUMIN 3.1*   No results for input(s): LIPASE, AMYLASE in the last 168 hours. Recent Labs  Lab 01/12/20 0819  AMMONIA 18   CBC: Recent Labs  Lab 01/11/20 1816 01/12/20 0332  WBC 5.5 4.8  HGB 9.4* 10.0*  HCT 28.1* 29.9*  MCV 94.9 95.5  PLT 129* 146*   Cardiac Enzymes: No results for input(s): CKTOTAL, CKMB, CKMBINDEX, TROPONINI in the last 168 hours. BNP: Invalid input(s): POCBNP CBG: Recent Labs  Lab 01/11/20 2345 01/12/20 0338 01/12/20 0727 01/12/20 1124 01/12/20 1404  GLUCAP 125* 129* 111* 145* 105*   D-Dimer No results for input(s): DDIMER in the last 72 hours. Hgb A1c No results for input(s): HGBA1C in the last 72 hours. Lipid Profile No results for input(s): CHOL, HDL, LDLCALC, TRIG, CHOLHDL, LDLDIRECT in the last 72 hours. Thyroid function studies No results for input(s): TSH, T4TOTAL, T3FREE, THYROIDAB in the last 72 hours.  Invalid input(s): FREET3 Anemia work up No results for  input(s): VITAMINB12, FOLATE, FERRITIN, TIBC, IRON, RETICCTPCT in the last 72 hours. Urinalysis No results found for: COLORURINE, APPEARANCEUR, LABSPEC, PHURINE, GLUCOSEU, HGBUR, BILIRUBINUR, KETONESUR, PROTEINUR, UROBILINOGEN, NITRITE, LEUKOCYTESUR Sepsis Labs Invalid input(s): PROCALCITONIN,  WBC,  LACTICIDVEN Microbiology Recent Results (from the past 240 hour(s))  MRSA PCR Screening     Status: None   Collection Time: 01/11/20 10:18 PM   Specimen: Nasopharyngeal  Result Value Ref Range Status   MRSA by PCR NEGATIVE NEGATIVE Final    Comment:        The GeneXpert MRSA Assay (FDA approved for NASAL specimens only), is one component of a comprehensive MRSA colonization surveillance program. It is not intended to diagnose MRSA infection nor to guide or monitor treatment for MRSA infections. Performed at [facility], [address] [place], [place] [address]   SARS CORONAVIRUS 2 (TAT 6-24 HRS) Nasopharyngeal Nasopharyngeal Swab     Status: None   Collection Time: 01/12/20  9:25 AM   Specimen: Nasopharyngeal Swab  Result Value Ref Range Status   SARS Coronavirus 2 NEGATIVE NEGATIVE Final    Comment: (NOTE) SARS-CoV-2 target nucleic acids are NOT DETECTED. The SARS-CoV-2 RNA is generally detectable in upper and lower respiratory specimens during the acute phase of infection. Negative results do not preclude SARS-CoV-2 infection, do not rule out co-infections with other pathogens, and should not be used as the sole basis for treatment or other patient management decisions. Negative results must be combined with clinical observations, patient history, and epidemiological information. The expected result is Negative. Fact Sheet for Patients: HairSlick.no Fact Sheet for Healthcare Providers: quierodirigir.com This test is not yet approved or cleared by the [place] FDA and  has been authorized for detection  and/or diagnosis of SARS-CoV-2 by FDA under an Emergency Use Authorization (EUA). This EUA will remain  in effect (meaning this test can be used) for the duration of the COVID-19 declaration under Section 56 4(b)(1) of the Act, 21 U.S.C. section 360bbb-3(b)(1), unless the authorization is terminated or revoked sooner. Performed at [facility], [address], [place], [place] [address]      Time coordinating discharge: 35 minutes  SIGNED:   Eddie North, MD  [facility] 01/14/2020, 10:40 AM Pager   If 7PM-7AM, please contact night-coverage [email or web address] Password [email or web address]

## 2020-01-14 NOTE — TOC Progression Note (Addendum)
Transition of Care [facility]) - Progression Note    Patient Details  Name: [name] MRN: [id number] Date of Birth: 07/10/1946  [facility]) CM/SW Contact  Rutherford Cellar, RN Phone Number: 01/14/2020, 12:30 PM  Clinical Narrative:    RN CM spoke with Alfonse Flavors, [facility], requesting Physical Therapy services at discharge. States [facility] does not have capacity at this time to accept patient.   Outreach to [place] with [facility] who was able to accept patient. Confirmed name and DOB.         Expected Discharge Plan and Services           Expected Discharge Date: 01/14/20                                     Social Determinants of Health (SDOH) Interventions    Readmission Risk Interventions No flowsheet data found.

## 2020-02-02 ENCOUNTER — Telehealth: Payer: Self-pay | Admitting: Cardiovascular Disease

## 2020-02-02 NOTE — Telephone Encounter (Signed)
Spoke with pt who states he was discharged from hospital 01/14/2020 after being admitted for pneumonia.  Pt states his B/P when EMS arrived was 58/25.  Pt reports he has home health coming out # days per week and receiving PT for strength building as he is still very weak.  Pt is concerned because his B/P continues to "run low"  B/P 02/01/2020 was 110/58 and 98/72. Today's reading is 141/78.  Pt is due for follow up appointment with Dr Clifton James.  Will send information to MD and RN for review.

## 2020-02-02 NOTE — Telephone Encounter (Signed)
New Message  Pt c/o BP issue: STAT if pt c/o blurred vision, one-sided weakness or slurred speech  1. What are your last 5 BP readings? 58/25 (when sent to the ER), states he can't remember what blood pressure was exactly yesterday but he knows it is still low.   2. Are you having any other symptoms (ex. Dizziness, headache, blurred vision, passed out)? Feeling weak  3. What is your BP issue? BP low

## 2020-02-02 NOTE — Telephone Encounter (Signed)
His BP is better? [name]

## 2020-02-02 NOTE — Telephone Encounter (Signed)
BP improved as noted below. Today: 141/78 He has a brand new cuff at home but does not know where it's at.  The home health services are checking your blood pressure at home.  He is feeling better every day. Will stay hydrated and keep a list of blood pressure readings. Will call if any other questions or concerns. Scheduled May 6 w Dr. Clifton James.

## 2020-02-16 NOTE — Progress Notes (Signed)
Chief Complaint  Patient presents with  . Follow-up    CAD   History of Present Illness: 74 yo male with history of CAD, tobacco abuse, HTN, COPD here today for cardiac follow up. I saw him for the first time in December 2012. He is a former pt of Dr. Deborah Chalk. His heart history dates back to 1999 when he had a stent placed in the RCA. Stress myoview in 12/02/12 with no ischemia. Seen here March 2016 with c/o chest pain. Cardiac cath in April 2019 with patent RCA stent, unchanged moderate stenosis in the small to moderate caliber Diagonal branch, mild plaque in the LAD and Circumflex. LVEF=55-65%. He is a Patent attorney.   He is here today for follow up. The patient denies any chest pain, dyspnea, palpitations, lower extremity edema, orthopnea, PND, dizziness, near syncope or syncope. He is recovering from a recent pneumonia.    Primary Care Physician: Barbette Reichmann, MD  Past Medical History:  Diagnosis Date  . Anemia   . Arthritis   . Back pain    scoliosis  . CAD (coronary artery disease)    a. stent to RCA 1999.b. Last cath 01/2018 stable -> medical therapy.  Marland Kitchen COPD (chronic obstructive pulmonary disease) (HCC)   . Depression   . Diverticulosis   . GERD (gastroesophageal reflux disease)   . GI bleed   . History of colon polyps   . History of hiatal hernia   . Hyperkalemia   . Hyperlipidemia   . Hypertension   . Scoliosis   . Tobacco abuse     Past Surgical History:  Procedure Laterality Date  . APPENDECTOMY  1957  . BACK SURGERY    . CARDIAC CATHETERIZATION  1999   stent placement RCA  . CARDIAC CATHETERIZATION  2010   mild left CA distal stenosis, mild irregularities in RCA and LAD. patent setn in RCA  . COLONOSCOPY WITH PROPOFOL N/A 06/16/2018   Procedure: COLONOSCOPY WITH PROPOFOL;  Surgeon: Toledo, Boykin Nearing, MD;  Location: [facility];  Service: Gastroenterology;  Laterality: N/A;  . CORONARY ANGIOPLASTY    . CORONARY ARTERY BYPASS GRAFT     denies 09/30/19   . ESOPHAGOGASTRODUODENOSCOPY (EGD) WITH PROPOFOL N/A 06/16/2018   Procedure: ESOPHAGOGASTRODUODENOSCOPY (EGD) WITH PROPOFOL;  Surgeon: Toledo, Boykin Nearing, MD;  Location: [facility] ENDOSCOPY;  Service: Gastroenterology;  Laterality: N/A;  . FOOT SURGERY  1973   left  . LEFT HEART CATH AND CORONARY ANGIOGRAPHY N/A 01/22/2018   Procedure: LEFT HEART CATH AND CORONARY ANGIOGRAPHY;  Surgeon: Kathleene Hazel, MD;  [facility];  Service: Cardiovascular;  Laterality: N/A;  . LEFT HEART CATHETERIZATION WITH CORONARY ANGIOGRAM N/A 01/11/2015   Procedure: LEFT HEART CATHETERIZATION WITH CORONARY ANGIOGRAM;  Surgeon: Kathleene Hazel, MD;  Location: [facility] CATH LAB;  Service: Cardiovascular;  Laterality: N/A;  . OLECRANON BURSECTOMY Right 10/04/2019   Procedure: OLECRANON BURSA;  Surgeon: Signa Kell, MD;  Location: [facility] ORS;  Service: Orthopedics;  Laterality: Right;  . TRICEPS TENDON REPAIR Right 10/04/2019   Procedure: TRICEPS TENDON REPAIR AND PLECRANON BURSA EXCISION, POSSIBLE ALLOGRAFT AUGMENTATION.;  Surgeon: Signa Kell, MD;  Location: [facility] ORS;  Service: Orthopedics;  Laterality: Right;    Current Outpatient Medications  Medication Sig Dispense Refill  . acetaminophen (TYLENOL) 500 MG tablet Take 2 tablets (1,000 mg total) by mouth every 8 (eight) hours. 90 tablet 2  . aspirin EC 81 MG tablet Take 1 tablet (81 mg total) by mouth daily. 90 tablet 3  .  cyanocobalamin 500 MCG tablet Take 500 mcg by mouth 2 (two) times daily. VITAMIN B-12    . docusate sodium (COLACE) 50 MG capsule Take 1 capsule (50 mg total) by mouth daily as needed for mild constipation. 10 capsule 0  . escitalopram (LEXAPRO) 20 MG tablet Take 1 tablet (20 mg total) by mouth daily. 90 tablet 0  . gabapentin (NEURONTIN) 400 MG capsule Take 400 mg by mouth at bedtime.     Marland Kitchen loperamide (IMODIUM A-D) 2 MG tablet Take 1 tablet (2 mg total) by mouth 4 (four) times daily as needed for diarrhea or loose stools. 30  tablet 0  . montelukast (SINGULAIR) 10 MG tablet Take 1 tablet (10 mg total) by mouth at bedtime. 90 tablet 2  . morphine (MS CONTIN) 15 MG 12 hr tablet Take 15 mg by mouth 2 (two) times daily.     Marland Kitchen oxyCODONE-acetaminophen (PERCOCET) 5-325 MG per tablet Take 1 tablet by mouth every 4 (four) hours as needed for moderate pain. For pain    . promethazine (PHENERGAN) 25 MG tablet Take 25 mg by mouth as needed.     . rosuvastatin (CRESTOR) 40 MG tablet Take 1 tablet (40 mg total) by mouth daily. 90 tablet 3  . tiotropium (SPIRIVA HANDIHALER) 18 MCG inhalation capsule INHALE THE CONTENTS OF 1 CAPSULE AT BEDTIME AS DIRECTED PER PACKAGE INSTRUCTIONS 90 capsule 4  . valsartan (DIOVAN) 160 MG tablet Take 1 tablet (160 mg total) by mouth daily. 90 tablet 3  . Vitamin D, Ergocalciferol, (DRISDOL) 50000 UNITS CAPS capsule Take 50,000 Units by mouth every Wednesday.      No current facility-administered medications for this visit.    Allergies  Allergen Reactions  . Levofloxacin Itching and Rash    Social History   Socioeconomic History  . Marital status: Divorced    Spouse name: Not on file  . Number of children: 3  . Years of education: Not on file  . Highest education level: Not on file  Occupational History    Employer: RETIRED  Tobacco Use  . Smoking status: Current Every Day Smoker    Packs/day: 0.50    Years: 35.00    Pack years: 17.50    Types: Cigarettes  . Smokeless tobacco: Never Used  Substance and Sexual Activity  . Alcohol use: Yes    Alcohol/week: 6.0 standard drinks    Types: 6 Cans of beer per week  . Drug use: No  . Sexual activity: Not on file  Other Topics Concern  . Not on file  Social History Narrative  . Not on file   Social Determinants of Health   Financial Resource Strain:   . Difficulty of Paying Living Expenses:   Food Insecurity:   . Worried About Programme researcher, broadcasting/film/video in the Last Year:   . Barista in the Last Year:   Transportation Needs:    . Freight forwarder (Medical):   Marland Kitchen Lack of Transportation (Non-Medical):   Physical Activity:   . Days of Exercise per Week:   . Minutes of Exercise per Session:   Stress:   . Feeling of Stress :   Social Connections:   . Frequency of Communication with Friends and Family:   . Frequency of Social Gatherings with Friends and Family:   . Attends Religious Services:   . Active Member of Clubs or Organizations:   . Attends Banker Meetings:   Marland Kitchen Marital Status:   Intimate Partner Violence:   .  Fear of Current or Ex-Partner:   . Emotionally Abused:   Marland Kitchen Physically Abused:   . Sexually Abused:     Family History  Problem Relation Age of Onset  . Alzheimer's disease Father   . Angina Father   . Diabetes Cousin        mothers side  . Heart attack Cousin        fathers side  . Hyperlipidemia Cousin        fathers side  . Hypertension Cousin        fathers side    Review of Systems:  As stated in the HPI and otherwise negative.   BP 128/72   Pulse 71   Ht 5\' 8"  (1.727 m)   Wt 144 lb (65.3 kg)   SpO2 97%   BMI 21.90 kg/m   Physical Examination: General: Well developed, well nourished, NAD  HEENT: OP clear, mucus membranes moist  SKIN: warm, dry. No rashes. Neuro: No focal deficits  Musculoskeletal: Muscle strength 5/5 all ext  Psychiatric: Mood and affect normal  Neck: No JVD, no carotid bruits, no thyromegaly, no lymphadenopathy.  Lungs:Clear bilaterally, no wheezes, rhonci, crackles Cardiovascular: Regular rate and rhythm. No murmurs, gallops or rubs. Abdomen:Soft. Bowel sounds present. Non-tender.  Extremities: No lower extremity edema. Pulses are 2 + in the bilateral DP/PT.  Cardiac cath 01/11/15: Left main: 40% calcified distal left main stenosis. Reviewed closely from multiple angles and unchanged from last cath.  Left Anterior Descending Artery: Large caliber vessel that does not reach to the apex. Mild proximal plaque. Moderate caliber  diagonal branch with no obstructive disease. Moderate caliber intermediate branch with no obstructive disease.  Circumflex Artery: Moderate caliber vessel with moderate caliber obtuse marginal. No obstructive disease.  Right Coronary Artery: Very large caliber dominant vessel with patent mid stent without restenosis. The moderate caliber posterolateral branch has proximal 40% stenosis. The large caliber PDA has no obstructive disease.  Left Ventricular Angiogram: LVEF=60-65%.  Impression: 1. Stable single vessel CAD with patent stent mid RCA 2. Mild distal left main stenosis, unchanged from last cath in 2010 3. Normal LV systolic function  EKG:  EKG is not ordered today. The ekg ordered today demonstrates   Recent Labs: 01/11/2020: ALT 17; Magnesium 2.1 01/12/2020: BUN 20; Creatinine, Ser 0.79; Hemoglobin 10.0; Platelets 146; Potassium 3.6; Sodium 141   Lipid Panel Followed in the [facility]   Wt Readings from Last 3 Encounters:  02/17/20 144 lb (65.3 kg)  01/12/20 148 lb 8 oz (67.4 kg)  01/06/20 160 lb (72.6 kg)     Other studies Reviewed: Additional studies/ records that were reviewed today include: . Review of the above records demonstrates:    Assessment and Plan:   1. CAD without angina:  No chest pain. CAD stable by cardiac cath in April 2019. Continue ASA and statin. No beta blocker due to bradycardia.   2. Tobacco abuse: I have asked him to stop smoking.    3. HTN: BP is controlled. Continue current therapy.   4. Hyperlipidemia: Lipids controlled per pt and followed in the May 2019. Continue statin.   Current medicines are reviewed at length with the patient today.  The patient does not have concerns regarding medicines.  The following changes have been made:  no change  Labs/ tests ordered today include:   No orders of the defined types were placed in this encounter.   Disposition:   FU with me in 12 months  Signed, [place], MD 02/17/2020 2:19  PM    [facility] [place], [place], [place]  [address] Phone: [phone]; Fax: [phone]

## 2020-02-17 ENCOUNTER — Encounter: Payer: Self-pay | Admitting: Cardiovascular Disease

## 2020-02-17 ENCOUNTER — Ambulatory Visit (INDEPENDENT_AMBULATORY_CARE_PROVIDER_SITE_OTHER): Payer: Medicare Other | Admitting: Cardiovascular Disease

## 2020-02-17 ENCOUNTER — Other Ambulatory Visit: Payer: Self-pay

## 2020-02-17 VITALS — BP 128/72 | HR 71 | Ht 68.0 in | Wt 144.0 lb

## 2020-02-17 DIAGNOSIS — E7800 Pure hypercholesterolemia, unspecified: Secondary | ICD-10-CM | POA: Diagnosis not present

## 2020-02-17 DIAGNOSIS — I10 Essential (primary) hypertension: Secondary | ICD-10-CM | POA: Diagnosis not present

## 2020-02-17 DIAGNOSIS — I2510 Atherosclerotic heart disease of native coronary artery without angina pectoris: Secondary | ICD-10-CM | POA: Diagnosis not present

## 2020-02-17 NOTE — Patient Instructions (Addendum)
Medication Instructions:  *If you need a refill on your cardiac medications before your next appointment, please call your pharmacy*  Lab Work: If you have labs (blood work) drawn today and your tests are completely normal, you will receive your results only by: . MyChart Message (if you have MyChart) OR . A paper copy in the mail If you have any lab test that is abnormal or we need to change your treatment, we will call you to review the results.  Follow-Up: At [facility], you and your health needs are our priority.  As part of our continuing mission to provide you with exceptional heart care, we have created designated Provider Care Teams.  These Care Teams include your primary Cardiologist (physician) and Advanced Practice Providers (APPs -  Physician Assistants and Nurse Practitioners) who all work together to provide you with the care you need, when you need it.  We recommend signing up for the patient portal called "MyChart".  Sign up information is provided on this After Visit Summary.  MyChart is used to connect with patients for Virtual Visits (Telemedicine).  Patients are able to view lab/test results, encounter notes, upcoming appointments, etc.  Non-urgent messages can be sent to your provider as well.   To learn more about what you can do with MyChart, go to [email or web address].    Your next appointment:   12 month(s)  The format for your next appointment:   In Person  Provider:   You may see Christopher McAlhany, MD or one of the following Advanced Practice Providers on your designated Care Team:    Dayna Dunn, PA-C  Michele Lenze, PA-C  

## 2020-04-06 ENCOUNTER — Telehealth: Payer: Self-pay

## 2020-04-06 NOTE — Telephone Encounter (Signed)
Confirmed virtual visit on 04/10/2020. klh

## 2020-04-10 ENCOUNTER — Ambulatory Visit (INDEPENDENT_AMBULATORY_CARE_PROVIDER_SITE_OTHER): Payer: Medicare Other | Admitting: Internal Medicine

## 2020-04-10 ENCOUNTER — Encounter: Payer: Self-pay | Admitting: Internal Medicine

## 2020-04-10 VITALS — Resp 16 | Ht 68.0 in | Wt 148.0 lb

## 2020-04-10 DIAGNOSIS — F17210 Nicotine dependence, cigarettes, uncomplicated: Secondary | ICD-10-CM | POA: Diagnosis not present

## 2020-04-10 DIAGNOSIS — J449 Chronic obstructive pulmonary disease, unspecified: Secondary | ICD-10-CM | POA: Diagnosis not present

## 2020-04-10 DIAGNOSIS — R05 Cough: Secondary | ICD-10-CM

## 2020-04-10 DIAGNOSIS — R059 Cough, unspecified: Secondary | ICD-10-CM

## 2020-04-10 NOTE — Progress Notes (Signed)
[facility] [address] [place], [place] [address]  Internal MEDICINE  Telephone Visit  Patient Name: [name]  [id number]  [id number]  Date of Service: 04/10/2020  I connected with the patient at 1144 by telephone and verified the patients identity using two identifiers.   I discussed the limitations, risks, security and privacy concerns of performing an evaluation and management service by telephone and the availability of in person appointments. I also discussed with the patient that there may be a patient responsible charge related to the service.  The patient expressed understanding and agrees to proceed.    Chief Complaint  Patient presents with  . Telephone Assessment  . Telephone Screen  . Follow-up    spent 7 days in hospitial had pneumonia  . COPD    HPI  Pt seen via telephone.  Patient reports overall he is doing well.  He reports back in March he was admitted to the hospital for Acute respiratory failure with hypoxia.  He was septic and given IV antibiotics.  He denies any coughing, or sob at this time.  He reports he is finally back at his baseline.  He has a history of copd that is currently controlled. He is currently on Spiriva.     Current Medication: Outpatient Encounter Medications as of 04/10/2020  Medication Sig  . acetaminophen (TYLENOL) 500 MG tablet Take 2 tablets (1,000 mg total) by mouth every 8 (eight) hours.  Marland Kitchen aspirin EC 81 MG tablet Take 1 tablet (81 mg total) by mouth daily.  . cyanocobalamin 500 MCG tablet Take 500 mcg by mouth 2 (two) times daily. VITAMIN B-12  . docusate sodium (COLACE) 50 MG capsule Take 1 capsule (50 mg total) by mouth daily as needed for mild constipation.  Marland Kitchen escitalopram (LEXAPRO) 20 MG tablet Take 1 tablet (20 mg total) by mouth daily.  Marland Kitchen gabapentin (NEURONTIN) 400 MG capsule Take 400 mg by mouth at bedtime.   Marland Kitchen loperamide (IMODIUM A-D) 2 MG tablet Take 1 tablet (2 mg total) by mouth 4 (four) times daily as  needed for diarrhea or loose stools.  . montelukast (SINGULAIR) 10 MG tablet Take 1 tablet (10 mg total) by mouth at bedtime.  Marland Kitchen morphine (MS CONTIN) 15 MG 12 hr tablet Take 15 mg by mouth 2 (two) times daily.   Marland Kitchen oxyCODONE-acetaminophen (PERCOCET) 5-325 MG per tablet Take 1 tablet by mouth every 4 (four) hours as needed for moderate pain. For pain  . promethazine (PHENERGAN) 25 MG tablet Take 25 mg by mouth as needed.   . rosuvastatin (CRESTOR) 40 MG tablet Take 1 tablet (40 mg total) by mouth daily.  Marland Kitchen tiotropium (SPIRIVA HANDIHALER) 18 MCG inhalation capsule INHALE THE CONTENTS OF 1 CAPSULE AT BEDTIME AS DIRECTED PER PACKAGE INSTRUCTIONS  . valsartan (DIOVAN) 160 MG tablet Take 1 tablet (160 mg total) by mouth daily.  . Vitamin D, Ergocalciferol, (DRISDOL) 50000 UNITS CAPS capsule Take 50,000 Units by mouth every Wednesday.    No facility-administered encounter medications on file as of 04/10/2020.    Surgical History: Past Surgical History:  Procedure Laterality Date  . APPENDECTOMY  1957  . BACK SURGERY    . CARDIAC CATHETERIZATION  1999   stent placement RCA  . CARDIAC CATHETERIZATION  2010   mild left CA distal stenosis, mild irregularities in RCA and LAD. patent setn in RCA  . COLONOSCOPY WITH PROPOFOL N/A 06/16/2018   Procedure: COLONOSCOPY WITH PROPOFOL;  Surgeon: Norma Fredrickson, Boykin Nearing, MD;  Location:  [facility] ENDOSCOPY;  Service: Gastroenterology;  Laterality: N/A;  . CORONARY ANGIOPLASTY    . CORONARY ARTERY BYPASS GRAFT     denies 09/30/19  . ESOPHAGOGASTRODUODENOSCOPY (EGD) WITH PROPOFOL N/A 06/16/2018   Procedure: ESOPHAGOGASTRODUODENOSCOPY (EGD) WITH PROPOFOL;  Surgeon: Toledo, Boykin Nearing, MD;  Location: [facility] ENDOSCOPY;  Service: Gastroenterology;  Laterality: N/A;  . FOOT SURGERY  1973   left  . LEFT HEART CATH AND CORONARY ANGIOGRAPHY N/A 01/22/2018   Procedure: LEFT HEART CATH AND CORONARY ANGIOGRAPHY;  Surgeon: Kathleene Hazel, MD;  Location: [facility];  Service:  Cardiovascular;  Laterality: N/A;  . LEFT HEART CATHETERIZATION WITH CORONARY ANGIOGRAM N/A 01/11/2015   Procedure: LEFT HEART CATHETERIZATION WITH CORONARY ANGIOGRAM;  Surgeon: Kathleene Hazel, MD;  Location: [facility] CATH LAB;  Service: Cardiovascular;  Laterality: N/A;  . OLECRANON BURSECTOMY Right 10/04/2019   Procedure: OLECRANON BURSA;  Surgeon: Signa Kell, MD;  Location: [facility] ORS;  Service: Orthopedics;  Laterality: Right;  . TRICEPS TENDON REPAIR Right 10/04/2019   Procedure: TRICEPS TENDON REPAIR AND PLECRANON BURSA EXCISION, POSSIBLE ALLOGRAFT AUGMENTATION.;  Surgeon: Signa Kell, MD;  Location: [facility] ORS;  Service: Orthopedics;  Laterality: Right;    Medical History: Past Medical History:  Diagnosis Date  . Anemia   . Arthritis   . Back pain    scoliosis  . CAD (coronary artery disease)    a. stent to RCA 1999.b. Last cath 01/2018 stable -> medical therapy.  Marland Kitchen COPD (chronic obstructive pulmonary disease) (HCC)   . Depression   . Diverticulosis   . GERD (gastroesophageal reflux disease)   . GI bleed   . History of colon polyps   . History of hiatal hernia   . Hyperkalemia   . Hyperlipidemia   . Hypertension   . Scoliosis   . Tobacco abuse     Family History: Family History  Problem Relation Age of Onset  . Alzheimer's disease Father   . Angina Father   . Diabetes Cousin        mothers side  . Heart attack Cousin        fathers side  . Hyperlipidemia Cousin        fathers side  . Hypertension Cousin        fathers side    Social History   Socioeconomic History  . Marital status: Divorced    Spouse name: Not on file  . Number of children: 3  . Years of education: Not on file  . Highest education level: Not on file  Occupational History    Employer: RETIRED  Tobacco Use  . Smoking status: Current Every Day Smoker    Packs/day: 0.50    Years: 35.00    Pack years: 17.50    Types: Cigarettes  . Smokeless tobacco: Never Used  Vaping Use  . Vaping  Use: Never used  Substance and Sexual Activity  . Alcohol use: Yes    Alcohol/week: 6.0 standard drinks    Types: 6 Cans of beer per week  . Drug use: No  . Sexual activity: Not on file  Other Topics Concern  . Not on file  Social History Narrative  . Not on file   Social Determinants of Health   Financial Resource Strain:   . Difficulty of Paying Living Expenses:   Food Insecurity:   . Worried About Programme researcher, broadcasting/film/video in the Last Year:   . Barista in the Last Year:   Transportation Needs:   .  Lack of Transportation (Medical):   Marland Kitchen Lack of Transportation (Non-Medical):   Physical Activity:   . Days of Exercise per Week:   . Minutes of Exercise per Session:   Stress:   . Feeling of Stress :   Social Connections:   . Frequency of Communication with Friends and Family:   . Frequency of Social Gatherings with Friends and Family:   . Attends Religious Services:   . Active Member of Clubs or Organizations:   . Attends Banker Meetings:   Marland Kitchen Marital Status:   Intimate Partner Violence:   . Fear of Current or Ex-Partner:   . Emotionally Abused:   Marland Kitchen Physically Abused:   . Sexually Abused:       Review of Systems  Constitutional: Negative.  Negative for chills, fatigue and unexpected weight change.  HENT: Negative.  Negative for congestion, rhinorrhea, sneezing and sore throat.   Eyes: Negative for redness.  Respiratory: Negative.  Negative for cough, chest tightness and shortness of breath.   Cardiovascular: Negative.  Negative for chest pain and palpitations.  Gastrointestinal: Negative.  Negative for abdominal pain, constipation, diarrhea, nausea and vomiting.  Endocrine: Negative.   Genitourinary: Negative.  Negative for dysuria and frequency.  Musculoskeletal: Negative.  Negative for arthralgias, back pain, joint swelling and neck pain.  Skin: Negative.  Negative for rash.  Allergic/Immunologic: Negative.   Neurological: Negative.  Negative for  tremors and numbness.  Hematological: Negative for adenopathy. Does not bruise/bleed easily.  Psychiatric/Behavioral: Negative.  Negative for behavioral problems, sleep disturbance and suicidal ideas. The patient is not nervous/anxious.     Vital Signs: Resp 16   Ht 5\' 8"  (1.727 m)   Wt 148 lb (67.1 kg)   BMI 22.50 kg/m    Observation/Objective:  Well sounding, NAD noted.    Assessment/Plan: 1. Obstructive chronic bronchitis without exacerbation (HCC) Continue to use spiriva as prescribed.  Follow up with any changes.   2. Cough Resolving continue to monitor.   3. Nicotine dependence, cigarettes, uncomplicated Smoking cessation counseling: 1. Pt acknowledges the risks of long term smoking, she will try to quite smoking. 2. Options for different medications including nicotine products, chewing gum, patch etc, Wellbutrin and Chantix is discussed 3. Goal and date of compete cessation is discussed 4. Total time spent in smoking cessation is 15 min.   General Counseling: Geoff verbalizes understanding of the findings of today's phone visit and agrees with plan of treatment. I have discussed any further diagnostic evaluation that may be needed or ordered today. We also reviewed his medications today. he has been encouraged to call the office with any questions or concerns that should arise related to todays visit.    No orders of the defined types were placed in this encounter.   No orders of the defined types were placed in this encounter.   Time spent: 25 Minutes    Dr 26 Internal medicine

## 2020-04-28 ENCOUNTER — Other Ambulatory Visit: Payer: Self-pay | Admitting: Adult Health

## 2020-06-09 ENCOUNTER — Other Ambulatory Visit: Payer: Self-pay | Admitting: Physician Assistant

## 2020-06-14 ENCOUNTER — Other Ambulatory Visit
Admission: RE | Admit: 2020-06-14 | Discharge: 2020-06-14 | Disposition: A | Discharge status: Home / Self Care | Payer: Medicare Other | Source: Ambulatory Visit | Attending: Internal Medicine | Admitting: Internal Medicine

## 2020-06-14 DIAGNOSIS — M25512 Pain in left shoulder: Secondary | ICD-10-CM | POA: Insufficient documentation

## 2020-06-14 DIAGNOSIS — J440 Chronic obstructive pulmonary disease with acute lower respiratory infection: Secondary | ICD-10-CM | POA: Diagnosis present

## 2020-06-14 DIAGNOSIS — Z0000 Encounter for general adult medical examination without abnormal findings: Secondary | ICD-10-CM | POA: Diagnosis present

## 2020-06-14 DIAGNOSIS — F3342 Major depressive disorder, recurrent, in full remission: Secondary | ICD-10-CM | POA: Insufficient documentation

## 2020-06-14 DIAGNOSIS — Z720 Tobacco use: Secondary | ICD-10-CM | POA: Insufficient documentation

## 2020-06-14 DIAGNOSIS — K219 Gastro-esophageal reflux disease without esophagitis: Secondary | ICD-10-CM | POA: Insufficient documentation

## 2020-06-14 DIAGNOSIS — I2510 Atherosclerotic heart disease of native coronary artery without angina pectoris: Secondary | ICD-10-CM | POA: Insufficient documentation

## 2020-06-14 DIAGNOSIS — J209 Acute bronchitis, unspecified: Secondary | ICD-10-CM | POA: Diagnosis present

## 2020-06-14 DIAGNOSIS — J432 Centrilobular emphysema: Secondary | ICD-10-CM | POA: Diagnosis present

## 2020-06-14 DIAGNOSIS — I10 Essential (primary) hypertension: Secondary | ICD-10-CM | POA: Insufficient documentation

## 2020-06-14 LAB — TROPONIN I (HIGH SENSITIVITY): Troponin I (High Sensitivity): 4 ng/L (ref <18)

## 2020-07-10 ENCOUNTER — Ambulatory Visit: Payer: TRICARE For Life (TFL) | Admitting: Internal Medicine

## 2020-07-24 ENCOUNTER — Other Ambulatory Visit: Payer: Self-pay | Admitting: Physician Assistant

## 2020-07-24 DIAGNOSIS — I10 Essential (primary) hypertension: Secondary | ICD-10-CM

## 2020-07-24 DIAGNOSIS — E7800 Pure hypercholesterolemia, unspecified: Secondary | ICD-10-CM

## 2020-07-24 DIAGNOSIS — I2510 Atherosclerotic heart disease of native coronary artery without angina pectoris: Secondary | ICD-10-CM

## 2020-07-25 ENCOUNTER — Other Ambulatory Visit: Payer: Self-pay

## 2020-07-25 DIAGNOSIS — J449 Chronic obstructive pulmonary disease, unspecified: Secondary | ICD-10-CM

## 2020-07-25 MED ORDER — SPIRIVA HANDIHALER 18 MCG IN CAPS: ORAL_CAPSULE | RESPIRATORY_TRACT | 1 refills | Status: DC

## 2020-07-25 NOTE — Telephone Encounter (Signed)
4. Hyperlipidemia: Lipids controlled per pt and followed in the [place]. Continue statin.

## 2020-07-31 ENCOUNTER — Ambulatory Visit: Payer: TRICARE For Life (TFL) | Admitting: Internal Medicine

## 2020-08-15 ENCOUNTER — Ambulatory Visit: Payer: TRICARE For Life (TFL) | Admitting: Internal Medicine

## 2020-08-22 ENCOUNTER — Ambulatory Visit: Payer: TRICARE For Life (TFL) | Admitting: Internal Medicine

## 2020-11-06 ENCOUNTER — Other Ambulatory Visit: Payer: Self-pay

## 2020-11-06 ENCOUNTER — Emergency Department
Admission: EM | Admit: 2020-11-06 | Discharge: 2020-11-06 | Disposition: A | Discharge status: Home / Self Care | Payer: Medicare Other | Attending: Emergency Medicine | Admitting: Emergency Medicine

## 2020-11-06 ENCOUNTER — Encounter: Payer: Self-pay | Admitting: Emergency Medicine

## 2020-11-06 ENCOUNTER — Emergency Department: Payer: Medicare Other

## 2020-11-06 DIAGNOSIS — F17210 Nicotine dependence, cigarettes, uncomplicated: Secondary | ICD-10-CM | POA: Diagnosis not present

## 2020-11-06 DIAGNOSIS — Z951 Presence of aortocoronary bypass graft: Secondary | ICD-10-CM | POA: Insufficient documentation

## 2020-11-06 DIAGNOSIS — Z20822 Contact with and (suspected) exposure to covid-19: Secondary | ICD-10-CM | POA: Insufficient documentation

## 2020-11-06 DIAGNOSIS — Z79899 Other long term (current) drug therapy: Secondary | ICD-10-CM | POA: Diagnosis not present

## 2020-11-06 DIAGNOSIS — Z9861 Coronary angioplasty status: Secondary | ICD-10-CM | POA: Insufficient documentation

## 2020-11-06 DIAGNOSIS — I25118 Atherosclerotic heart disease of native coronary artery with other forms of angina pectoris: Secondary | ICD-10-CM | POA: Diagnosis not present

## 2020-11-06 DIAGNOSIS — I10 Essential (primary) hypertension: Secondary | ICD-10-CM | POA: Diagnosis not present

## 2020-11-06 DIAGNOSIS — J449 Chronic obstructive pulmonary disease, unspecified: Secondary | ICD-10-CM | POA: Insufficient documentation

## 2020-11-06 DIAGNOSIS — R531 Weakness: Secondary | ICD-10-CM | POA: Diagnosis not present

## 2020-11-06 DIAGNOSIS — R1032 Left lower quadrant pain: Secondary | ICD-10-CM | POA: Diagnosis not present

## 2020-11-06 DIAGNOSIS — R112 Nausea with vomiting, unspecified: Secondary | ICD-10-CM | POA: Insufficient documentation

## 2020-11-06 DIAGNOSIS — R5383 Other fatigue: Secondary | ICD-10-CM | POA: Diagnosis not present

## 2020-11-06 DIAGNOSIS — R197 Diarrhea, unspecified: Secondary | ICD-10-CM | POA: Insufficient documentation

## 2020-11-06 DIAGNOSIS — Z7982 Long term (current) use of aspirin: Secondary | ICD-10-CM | POA: Diagnosis not present

## 2020-11-06 LAB — CBC
HCT: 37.5 % — ABNORMAL LOW (ref 39.0–52.0)
Hemoglobin: 12.7 g/dL — ABNORMAL LOW (ref 13.0–17.0)
MCH: 32.2 pg (ref 26.0–34.0)
MCHC: 33.9 g/dL (ref 30.0–36.0)
MCV: 95.2 fL (ref 80.0–100.0)
Platelets: 251 10*3/uL (ref 150–400)
RBC: 3.94 MIL/uL — ABNORMAL LOW (ref 4.22–5.81)
RDW: 12.3 % (ref 11.5–15.5)
WBC: 6.6 10*3/uL (ref 4.0–10.5)
nRBC: 0 % (ref 0.0–0.2)

## 2020-11-06 LAB — LIPASE, BLOOD: Lipase: 26 U/L (ref 11–51)

## 2020-11-06 LAB — COMPREHENSIVE METABOLIC PANEL
ALT: 15 U/L (ref 0–44)
AST: 17 U/L (ref 15–41)
Albumin: 4 g/dL (ref 3.5–5.0)
Alkaline Phosphatase: 72 U/L (ref 38–126)
Anion gap: 12 (ref 5–15)
BUN: 25 mg/dL — ABNORMAL HIGH (ref 8–23)
CO2: 26 mmol/L (ref 22–32)
Calcium: 9.9 mg/dL (ref 8.9–10.3)
Chloride: 101 mmol/L (ref 98–111)
Creatinine, Ser: 0.85 mg/dL (ref 0.61–1.24)
GFR, Estimated: >60 mL/min (ref >60)
Glucose, Bld: 127 mg/dL — ABNORMAL HIGH (ref 70–99)
Potassium: 3.6 mmol/L (ref 3.5–5.1)
Sodium: 139 mmol/L (ref 135–145)
Total Bilirubin: 0.6 mg/dL (ref 0.3–1.2)
Total Protein: 8.1 g/dL (ref 6.5–8.1)

## 2020-11-06 LAB — SARS CORONAVIRUS 2 BY RT PCR (HOSPITAL ORDER, PERFORMED IN ~~LOC~~ HOSPITAL LAB): SARS Coronavirus 2: NEGATIVE

## 2020-11-06 MED ORDER — ONDANSETRON HCL 4 MG PO TABS: 4.0000 mg | ORAL_TABLET | Freq: Every day | ORAL | 0 refills | Status: DC | PRN

## 2020-11-06 MED ORDER — SODIUM CHLORIDE 0.9 % IV BOLUS
1000.0000 mL | Freq: Once | INTRAVENOUS | Status: AC
  Administered 2020-11-06: 1000 mL via INTRAVENOUS

## 2020-11-06 MED ORDER — IOHEXOL 300 MG/ML  SOLN
100.0000 mL | Freq: Once | INTRAMUSCULAR | Status: AC | PRN
  Administered 2020-11-06: 100 mL via INTRAVENOUS
  Filled 2020-11-06: qty 100

## 2020-11-06 MED ORDER — ONDANSETRON HCL 4 MG/2ML IJ SOLN
4.0000 mg | Freq: Once | INTRAMUSCULAR | Status: AC
  Administered 2020-11-06: 4 mg via INTRAVENOUS
  Filled 2020-11-06: qty 2

## 2020-11-06 NOTE — ED Notes (Signed)
Pt to ED via [facility] from home for N/V/D x 1 day. Pt denies fever. Pt has hx/o AAA. Pt is in NAD.

## 2020-11-06 NOTE — ED Triage Notes (Signed)
Pt to ED via [facility] from home for N/V/D x 1 days. Pt is unsure if he has had fever. Pt states that he is unsure if he has been exposed to COVID. Pt is in NAD.

## 2020-11-06 NOTE — ED Provider Notes (Signed)
[facility] Emergency Department Provider Note  Time seen: 9:06 AM  I have reviewed the triage vital signs and the nursing notes.   HISTORY  Chief Complaint Emesis and Diarrhea   HPI [name] is a 75 y.o. male with a past medical history of anemia, CAD with stents, gastric reflux, hypertension, hyperlipidemia, presents to the emergency department for nausea vomiting diarrhea.  According to the patient since last night he has been experiencing nausea vomiting diarrhea with moderate dull lower abdominal discomfort mostly in left lower quadrant.  Is not sure if he has had a fever.  States he feels generally unwell with generalized weakness and fatigue as well.  Denies any recent cough congestion.   Past Medical History:  Diagnosis Date  . Anemia   . Arthritis   . Back pain    scoliosis  . CAD (coronary artery disease)    a. stent to RCA 1999.b. Last cath 01/2018 stable -> medical therapy.  Marland Kitchen COPD (chronic obstructive pulmonary disease) (HCC)   . Depression   . Diverticulosis   . GERD (gastroesophageal reflux disease)   . GI bleed   . History of colon polyps   . History of hiatal hernia   . Hyperkalemia   . Hyperlipidemia   . Hypertension   . Scoliosis   . Tobacco abuse     Patient Active Problem List   Diagnosis Date Noted  . Alcohol withdrawal delirium (HCC) 01/11/2020  . Lobar pneumonia, unspecified organism (HCC) 01/11/2020  . Acute respiratory failure with hypoxia (HCC) 01/11/2020  . Acute pulmonary edema (HCC) 01/11/2020  . Toxic metabolic encephalopathy 01/11/2020  . Normocytic anemia 01/11/2020  . Narcotic dependency, continuous (HCC) 01/11/2020  . 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 Benign essential hypertension 02/13/2017  . Centrilobular emphysema (HCC) 02/13/2017  . Chronic bilateral low back pain with right-sided sciatica 02/13/2017  . Gastroesophageal reflux disease  02/13/2017  . Major depressive disorder, recurrent, in full remission (HCC) 02/13/2017  . CAD in native artery 03/24/2011  . Tobacco user 03/24/2011  . Hypercholesterolemia 03/24/2011    Past Surgical History:  Procedure Laterality Date  . APPENDECTOMY  1957  . BACK SURGERY    . CARDIAC CATHETERIZATION  1999   stent placement RCA  . CARDIAC CATHETERIZATION  2010   mild left CA distal stenosis, mild irregularities in RCA and LAD. patent setn in RCA  . COLONOSCOPY WITH PROPOFOL N/A 06/16/2018   Procedure: COLONOSCOPY WITH PROPOFOL;  Surgeon: Toledo, Boykin Nearing, MD;  Location: [facility] ENDOSCOPY;  Service: Gastroenterology;  Laterality: N/A;  . CORONARY ANGIOPLASTY    . CORONARY ARTERY BYPASS GRAFT     denies 09/30/19  . ESOPHAGOGASTRODUODENOSCOPY (EGD) WITH PROPOFOL N/A 06/16/2018   Procedure: ESOPHAGOGASTRODUODENOSCOPY (EGD) WITH PROPOFOL;  Surgeon: Toledo, Boykin Nearing, MD;  Location: [facility] ENDOSCOPY;  Service: Gastroenterology;  Laterality: N/A;  . FOOT SURGERY  1973   left  . LEFT HEART CATH AND CORONARY ANGIOGRAPHY N/A 01/22/2018   Procedure: LEFT HEART CATH AND CORONARY ANGIOGRAPHY;  Surgeon: Kathleene Hazel, MD;  Location: [facility];  Service: Cardiovascular;  Laterality: N/A;  . LEFT HEART CATHETERIZATION WITH CORONARY ANGIOGRAM N/A 01/11/2015   Procedure: LEFT HEART CATHETERIZATION WITH CORONARY ANGIOGRAM;  Surgeon: Kathleene Hazel, MD;  Location: [facility] CATH LAB;  Service: Cardiovascular;  Laterality: N/A;  . OLECRANON BURSECTOMY Right 10/04/2019   Procedure: OLECRANON BURSA;  Surgeon: Signa Kell, MD;  Location:  [facility] ORS;  Service: Orthopedics;  Laterality: Right;  . TRICEPS TENDON REPAIR Right 10/04/2019   Procedure: TRICEPS TENDON REPAIR AND PLECRANON BURSA EXCISION, POSSIBLE ALLOGRAFT AUGMENTATION.;  Surgeon: Signa Kell, MD;  Location: [facility] ORS;  Service: Orthopedics;  Laterality: Right;    Prior to Admission medications   Medication Sig Start Date End Date  Taking? Authorizing Provider  aspirin EC 81 MG tablet Take 1 tablet (81 mg total) by mouth daily. 02/24/18   Robbie Lis M, PA-C  cyanocobalamin 500 MCG tablet Take 500 mcg by mouth 2 (two) times daily. VITAMIN B-12 08/18/14   [provider]  docusate sodium (COLACE) 50 MG capsule Take 1 capsule (50 mg total) by mouth daily as needed for mild constipation. 01/14/20   Dhungel, Nishant, MD  escitalopram (LEXAPRO) 20 MG tablet Take 1 tablet (20 mg total) by mouth daily. 04/12/14   Kathleene Hazel, MD  gabapentin (NEURONTIN) 400 MG capsule Take 400 mg by mouth at bedtime.     [provider]  loperamide (IMODIUM A-D) 2 MG tablet Take 1 tablet (2 mg total) by mouth 4 (four) times daily as needed for diarrhea or loose stools. 01/14/20   Dhungel, Nishant, MD  montelukast (SINGULAIR) 10 MG tablet TAKE 1 TABLET AT BEDTIME 04/28/20   Johnna Acosta, NP  morphine (MS CONTIN) 15 MG 12 hr tablet Take 15 mg by mouth 2 (two) times daily.     [provider]  oxyCODONE-acetaminophen (PERCOCET) 5-325 MG per tablet Take 1 tablet by mouth every 4 (four) hours as needed for moderate pain. For pain    [provider]  promethazine (PHENERGAN) 25 MG tablet Take 25 mg by mouth as needed.     [provider]  rosuvastatin (CRESTOR) 40 MG tablet TAKE 1 TABLET DAILY 07/27/20   Kathleene Hazel, MD  tiotropium (SPIRIVA HANDIHALER) 18 MCG inhalation capsule INHALE THE CONTENTS OF 1 CAPSULE AT BEDTIME AS DIRECTED PER PACKAGE INSTRUCTIONS 07/25/20   Johnna Acosta, NP  valsartan (DIOVAN) 160 MG tablet TAKE 1 TABLET DAILY 06/09/20   Kathleene Hazel, MD  Vitamin D, Ergocalciferol, (DRISDOL) 50000 UNITS CAPS capsule Take 50,000 Units by mouth every Wednesday.  12/15/14   [provider]    Allergies  Allergen Reactions  . Levofloxacin Itching and Rash    Family History  Problem Relation Age of Onset  . Alzheimer's disease Father   . Angina Father    . Diabetes Cousin        mothers side  . Heart attack Cousin        fathers side  . Hyperlipidemia Cousin        fathers side  . Hypertension Cousin        fathers side    Social History Social History   Tobacco Use  . Smoking status: Current Every Day Smoker    Packs/day: 0.50    Years: 35.00    Pack years: 17.50    Types: Cigarettes  . Smokeless tobacco: Never Used  Vaping Use  . Vaping Use: Never used  Substance Use Topics  . Alcohol use: Yes    Alcohol/week: 6.0 standard drinks    Types: 6 Cans of beer per week  . Drug use: No    Review of Systems Constitutional: Negative for fever. Cardiovascular: Negative for chest pain. Respiratory: Negative for shortness of breath. Gastrointestinal: Moderate dull left lower quadrant abdominal pain.  Positive for nausea vomiting diarrhea Genitourinary: Negative for urinary compaints Musculoskeletal:  Negative for musculoskeletal complaints Neurological: Negative for headache All other ROS negative  ____________________________________________   PHYSICAL EXAM:  VITAL SIGNS: ED Triage Vitals  Enc Vitals Group     BP 11/06/20 0533 125/73     Pulse Rate 11/06/20 0532 69     Resp 11/06/20 0532 16     Temp 11/06/20 0535 99.1 F (37.3 C)     Temp Source 11/06/20 0535 Oral     SpO2 11/06/20 0532 99 %     Weight 11/06/20 0532 147 lb 11.3 oz (67 kg)     Height 11/06/20 0532 5\' 8"  (1.727 m)     Head Circumference --      Peak Flow --      Pain Score 11/06/20 0532 8     Pain Loc --      Pain Edu? --      Excl. in GC? --    Constitutional: Alert and oriented. Well appearing and in no distress. Eyes: Normal exam ENT      Head: Normocephalic and atraumatic.      Mouth/Throat: Mucous membranes are moist. Cardiovascular: Normal rate, regular rhythm. Respiratory: Normal respiratory effort without tachypnea nor retractions. Breath sounds are clear Gastrointestinal: Soft, mild left lower quadrant tenderness palpation.  No  rebound guarding or distention. Musculoskeletal: Nontender with normal range of motion in all extremities.  Neurologic:  Normal speech and language. No gross focal neurologic deficits  Skin:  Skin is warm, dry and intact.  Psychiatric: Mood and affect are normal.   ____________________________________________     RADIOLOGY  CT shows diverticulosis without diverticulitis.  There is a 2.4 cm masslike opacity in the lung.  Discussed this with the patient and his daughter who states the patient has known lung cancer.  ____________________________________________   INITIAL IMPRESSION / ASSESSMENT AND PLAN / ED COURSE  Pertinent labs & imaging results that were available during my care of the patient were reviewed by me and considered in my medical decision making (see chart for details).   Patient presents to the emergency department for nausea vomiting diarrhea since last night.  Patient also has mild to moderate left lower quadrant tenderness to palpation.  No rebound guarding or distention.  Patient appears generalized fatigue/weak.  Basic labs are largely within normal limits.  We will dose IV fluids, nausea medication.  Given the patient's left lower quadrant tenderness we will obtain a CT scan as well as a Covid test to further evaluate.  Patient agreeable to plan of care.  CT negative for acute abnormality, patient is aware of lung mass.  Overall work-up is reassuring.  We will discharge with supportive care including Zofran and plenty of fluids.  Discussed return precautions.  [name] was evaluated in Emergency Department on 11/06/2020 for the symptoms described in the history of present illness. He was evaluated in the context of the global COVID-19 pandemic, which necessitated consideration that the patient might be at risk for infection with the SARS-CoV-2 virus that causes COVID-19. Institutional protocols and algorithms that pertain to the evaluation of patients at risk for  COVID-19 are in a state of rapid change based on information released by regulatory bodies including the CDC and federal and state organizations. These policies and algorithms were followed during the patient's care in the ED.  ____________________________________________   FINAL CLINICAL IMPRESSION(S) / ED DIAGNOSES  Nausea vomiting diarrhea Weakness   11/08/2020, MD 11/06/20 1153

## 2020-11-06 NOTE — ED Notes (Signed)
Pt to front desk requesting morphine for his back pain from scoliosis. Pt informed that we cannot give him pain medication for chronic pain in triage. Pt asked if he can call his daughter to come pick him up. Pt informed that he is free to leave at anytime. Pt using phone in lobby to call his ride. Pt ambulatory without difficulty.

## 2021-04-17 ENCOUNTER — Emergency Department
Admission: EM | Admit: 2021-04-17 | Discharge: 2021-04-17 | Disposition: A | Discharge status: Home / Self Care | Payer: Medicare Other | Attending: Emergency Medicine | Admitting: Emergency Medicine

## 2021-04-17 ENCOUNTER — Emergency Department: Payer: Medicare Other

## 2021-04-17 ENCOUNTER — Other Ambulatory Visit: Payer: Self-pay

## 2021-04-17 DIAGNOSIS — Z85118 Personal history of other malignant neoplasm of bronchus and lung: Secondary | ICD-10-CM | POA: Diagnosis not present

## 2021-04-17 DIAGNOSIS — Z20822 Contact with and (suspected) exposure to covid-19: Secondary | ICD-10-CM | POA: Diagnosis not present

## 2021-04-17 DIAGNOSIS — I119 Hypertensive heart disease without heart failure: Secondary | ICD-10-CM | POA: Insufficient documentation

## 2021-04-17 DIAGNOSIS — J449 Chronic obstructive pulmonary disease, unspecified: Secondary | ICD-10-CM | POA: Insufficient documentation

## 2021-04-17 DIAGNOSIS — J40 Bronchitis, not specified as acute or chronic: Secondary | ICD-10-CM | POA: Diagnosis not present

## 2021-04-17 DIAGNOSIS — Z79899 Other long term (current) drug therapy: Secondary | ICD-10-CM | POA: Insufficient documentation

## 2021-04-17 DIAGNOSIS — Z853 Personal history of malignant neoplasm of breast: Secondary | ICD-10-CM | POA: Insufficient documentation

## 2021-04-17 DIAGNOSIS — R0602 Shortness of breath: Secondary | ICD-10-CM | POA: Diagnosis present

## 2021-04-17 DIAGNOSIS — F17210 Nicotine dependence, cigarettes, uncomplicated: Secondary | ICD-10-CM | POA: Diagnosis not present

## 2021-04-17 DIAGNOSIS — I2510 Atherosclerotic heart disease of native coronary artery without angina pectoris: Secondary | ICD-10-CM | POA: Insufficient documentation

## 2021-04-17 DIAGNOSIS — Z7982 Long term (current) use of aspirin: Secondary | ICD-10-CM | POA: Insufficient documentation

## 2021-04-17 LAB — COMPREHENSIVE METABOLIC PANEL
ALT: 13 U/L (ref 0–44)
AST: 18 U/L (ref 15–41)
Albumin: 3.5 g/dL (ref 3.5–5.0)
Alkaline Phosphatase: 71 U/L (ref 38–126)
Anion gap: 8 (ref 5–15)
BUN: 22 mg/dL (ref 8–23)
CO2: 26 mmol/L (ref 22–32)
Calcium: 9 mg/dL (ref 8.9–10.3)
Chloride: 104 mmol/L (ref 98–111)
Creatinine, Ser: 0.93 mg/dL (ref 0.61–1.24)
GFR, Estimated: >60 mL/min (ref >60)
Glucose, Bld: 127 mg/dL — ABNORMAL HIGH (ref 70–99)
Potassium: 3.5 mmol/L (ref 3.5–5.1)
Sodium: 138 mmol/L (ref 135–145)
Total Bilirubin: 0.8 mg/dL (ref 0.3–1.2)
Total Protein: 7.2 g/dL (ref 6.5–8.1)

## 2021-04-17 LAB — RESP PANEL BY RT-PCR (FLU A&B, COVID) ARPGX2
Influenza A by PCR: NEGATIVE
Influenza B by PCR: NEGATIVE
SARS Coronavirus 2 by RT PCR: NEGATIVE

## 2021-04-17 LAB — CBC
HCT: 34.4 % — ABNORMAL LOW (ref 39.0–52.0)
Hemoglobin: 11.5 g/dL — ABNORMAL LOW (ref 13.0–17.0)
MCH: 32.7 pg (ref 26.0–34.0)
MCHC: 33.4 g/dL (ref 30.0–36.0)
MCV: 97.7 fL (ref 80.0–100.0)
Platelets: 134 10*3/uL — ABNORMAL LOW (ref 150–400)
RBC: 3.52 MIL/uL — ABNORMAL LOW (ref 4.22–5.81)
RDW: 13.6 % (ref 11.5–15.5)
WBC: 4.1 10*3/uL (ref 4.0–10.5)
nRBC: 0 % (ref 0.0–0.2)

## 2021-04-17 LAB — BRAIN NATRIURETIC PEPTIDE: B Natriuretic Peptide: 120.1 pg/mL — ABNORMAL HIGH (ref 0.0–100.0)

## 2021-04-17 LAB — TROPONIN I (HIGH SENSITIVITY): Troponin I (High Sensitivity): 6 ng/L (ref <18)

## 2021-04-17 MED ORDER — IPRATROPIUM-ALBUTEROL 0.5-2.5 (3) MG/3ML IN SOLN
3.0000 mL | Freq: Once | RESPIRATORY_TRACT | Status: AC
  Administered 2021-04-17: 3 mL via RESPIRATORY_TRACT
  Filled 2021-04-17: qty 3

## 2021-04-17 MED ORDER — AZITHROMYCIN 250 MG PO TABS: ORAL_TABLET | ORAL | 0 refills | Status: AC

## 2021-04-17 NOTE — ED Provider Notes (Signed)
[facility] Emergency Department Provider Note  Time seen: 11:05 AM  I have reviewed the triage vital signs and the nursing notes.   HISTORY  Chief Complaint Shortness of Breath  HPI [name] is a 75 y.o. male with a past medical history of anemia, CAD, COPD, gastric reflux, hypertension, hyperlipidemia, presents to the emergency department for cough with productive sputum.  According to EMS and the patient he has a history of lung cancer with recent radiation treatments.  States he has some mild shortness of breath and cough which she states is chronic but his sputum has turned from a clear color to a yellow color over the past 2 days which concerned the patient as well as subjective chills at home so he came to the emergency department as a precaution.  Patient denies any known fever.  No chest pain.  No vomiting.  Largely negative review of systems otherwise.  Currently satting 94 to 95% on room air, no baseline O2 requirement.   Past Medical History:  Diagnosis Date   Anemia    Arthritis    Back pain    scoliosis   CAD (coronary artery disease)    a. stent to RCA 1999.b. Last cath 01/2018 stable -> medical therapy.   COPD (chronic obstructive pulmonary disease) (HCC)    Depression    Diverticulosis    GERD (gastroesophageal reflux disease)    GI bleed    History of colon polyps    History of hiatal hernia    Hyperkalemia    Hyperlipidemia    Hypertension    Scoliosis    Tobacco abuse     Patient Active Problem List   Diagnosis Date Noted   Alcohol withdrawal delirium (HCC) 01/11/2020   Lobar pneumonia, unspecified organism (HCC) 01/11/2020   Acute respiratory failure with hypoxia (HCC) 01/11/2020   Acute pulmonary edema (HCC) 01/11/2020   Toxic metabolic encephalopathy 01/11/2020   Normocytic anemia 01/11/2020   Narcotic dependency, continuous (HCC) 01/11/2020   Coronary artery disease of native artery of transplanted heart with stable  angina pectoris (HCC)    Benign essential hypertension 02/13/2017   Centrilobular emphysema (HCC) 02/13/2017   Chronic bilateral low back pain with right-sided sciatica 02/13/2017   Gastroesophageal reflux disease 02/13/2017   Major depressive disorder, recurrent, in full remission (HCC) 02/13/2017   CAD in native artery 03/24/2011   Tobacco user 03/24/2011   Hypercholesterolemia 03/24/2011    Past Surgical History:  Procedure Laterality Date   APPENDECTOMY  1957   BACK SURGERY     CARDIAC CATHETERIZATION  1999   stent placement RCA   CARDIAC CATHETERIZATION  2010   mild left CA distal stenosis, mild irregularities in RCA and LAD. patent setn in RCA   COLONOSCOPY WITH PROPOFOL N/A 06/16/2018   Procedure: COLONOSCOPY WITH PROPOFOL;  Surgeon: Toledo, Boykin Nearingeodoro K, MD;  Location: [facility] ENDOSCOPY;  Service: Gastroenterology;  Laterality: N/A;   CORONARY ANGIOPLASTY     CORONARY ARTERY BYPASS GRAFT     denies 09/30/19   ESOPHAGOGASTRODUODENOSCOPY (EGD) WITH PROPOFOL N/A 06/16/2018   Procedure: ESOPHAGOGASTRODUODENOSCOPY (EGD) WITH PROPOFOL;  Surgeon: Toledo, Boykin Nearingeodoro K, MD;  Location: [facility] ENDOSCOPY;  Service: Gastroenterology;  Laterality: N/A;   FOOT SURGERY  1973   left   LEFT HEART CATH AND CORONARY ANGIOGRAPHY N/A 01/22/2018   Procedure: LEFT HEART CATH AND CORONARY ANGIOGRAPHY;  Surgeon: Kathleene HazelMcAlhany, Christopher D, MD;  Location: [facility];  Service: Cardiovascular;  Laterality: N/A;   LEFT HEART  CATHETERIZATION WITH CORONARY ANGIOGRAM N/A 01/11/2015   Procedure: LEFT HEART CATHETERIZATION WITH CORONARY ANGIOGRAM;  Surgeon: Kathleene Hazel, MD;  Location: [facility] CATH LAB;  Service: Cardiovascular;  Laterality: N/A;   OLECRANON BURSECTOMY Right 10/04/2019   Procedure: OLECRANON BURSA;  Surgeon: Signa Kell, MD;  Location: [facility] ORS;  Service: Orthopedics;  Laterality: Right;   TRICEPS TENDON REPAIR Right 10/04/2019   Procedure: TRICEPS TENDON REPAIR AND PLECRANON BURSA EXCISION,  POSSIBLE ALLOGRAFT AUGMENTATION.;  Surgeon: Signa Kell, MD;  Location: [facility] ORS;  Service: Orthopedics;  Laterality: Right;    Prior to Admission medications   Medication Sig Start Date End Date Taking? Authorizing Provider  aspirin EC 81 MG tablet Take 1 tablet (81 mg total) by mouth daily. 02/24/18   Robbie Lis M, PA-C  cyanocobalamin 500 MCG tablet Take 500 mcg by mouth 2 (two) times daily. VITAMIN B-12 08/18/14   [provider]  docusate sodium (COLACE) 50 MG capsule Take 1 capsule (50 mg total) by mouth daily as needed for mild constipation. 01/14/20   Dhungel, Nishant, MD  escitalopram (LEXAPRO) 20 MG tablet Take 1 tablet (20 mg total) by mouth daily. 04/12/14   Kathleene Hazel, MD  gabapentin (NEURONTIN) 400 MG capsule Take 400 mg by mouth at bedtime.     [provider]  loperamide (IMODIUM A-D) 2 MG tablet Take 1 tablet (2 mg total) by mouth 4 (four) times daily as needed for diarrhea or loose stools. 01/14/20   Dhungel, Nishant, MD  montelukast (SINGULAIR) 10 MG tablet TAKE 1 TABLET AT BEDTIME 04/28/20   Johnna Acosta, NP  morphine (MS CONTIN) 15 MG 12 hr tablet Take 15 mg by mouth 2 (two) times daily.     [provider]  ondansetron (ZOFRAN) 4 MG tablet Take 1 tablet (4 mg total) by mouth daily as needed for nausea or vomiting. 11/06/20   Minna Antis, MD  oxyCODONE-acetaminophen (PERCOCET) 5-325 MG per tablet Take 1 tablet by mouth every 4 (four) hours as needed for moderate pain. For pain    [provider]  promethazine (PHENERGAN) 25 MG tablet Take 25 mg by mouth as needed.     [provider]  rosuvastatin (CRESTOR) 40 MG tablet TAKE 1 TABLET DAILY 07/27/20   Kathleene Hazel, MD  tiotropium (SPIRIVA HANDIHALER) 18 MCG inhalation capsule INHALE THE CONTENTS OF 1 CAPSULE AT BEDTIME AS DIRECTED PER PACKAGE INSTRUCTIONS 07/25/20   Johnna Acosta, NP  valsartan (DIOVAN) 160 MG tablet TAKE 1 TABLET DAILY 06/09/20    Kathleene Hazel, MD  Vitamin D, Ergocalciferol, (DRISDOL) 50000 UNITS CAPS capsule Take 50,000 Units by mouth every Wednesday.  12/15/14   [provider]    Allergies  Allergen Reactions   Levofloxacin Itching and Rash    Family History  Problem Relation Age of Onset   Alzheimer's disease Father    Angina Father    Diabetes Cousin        mothers side   Heart attack Cousin        fathers side   Hyperlipidemia Cousin        fathers side   Hypertension Cousin        fathers side    Social History Social History   Tobacco Use   Smoking status: Every Day    Packs/day: 0.50    Years: 35.00    Pack years: 17.50    Types: Cigarettes   Smokeless tobacco: Never  Vaping Use   Vaping Use:  Never used  Substance Use Topics   Alcohol use: Yes    Alcohol/week: 6.0 standard drinks    Types: 6 Cans of beer per week   Drug use: No    Review of Systems Constitutional: Negative for fever. Cardiovascular: Negative for chest pain. Respiratory: Positive shortness of breath and cough which is chronic but now yellow sputum opposed to clear sputum. Gastrointestinal: Negative for abdominal pain, vomiting  Musculoskeletal: Negative for musculoskeletal complaints Neurological: Negative for headache All other ROS negative  ____________________________________________   PHYSICAL EXAM:  VITAL SIGNS: ED Triage Vitals  Enc Vitals Group     BP 04/17/21 1040 (!) 141/68     Pulse Rate 04/17/21 1040 (!) 59     Resp 04/17/21 1040 12     Temp 04/17/21 1040 98.2 F (36.8 C)     Temp Source 04/17/21 1040 Oral     SpO2 04/17/21 1040 94 %     Weight 04/17/21 1032 145 lb (65.8 kg)     Height 04/17/21 1032 5\' 8"  (1.727 m)     Head Circumference --      Peak Flow --      Pain Score 04/17/21 1032 6     Pain Loc --      Pain Edu? --      Excl. in GC? --    Constitutional: Alert and oriented. Well appearing and in no distress. Eyes: Normal exam ENT      Head: Normocephalic  and atraumatic.      Mouth/Throat: Mucous membranes are moist. Cardiovascular: Normal rate, regular rhythm. Respiratory: Normal respiratory effort without tachypnea nor retractions. Breath sounds are clear occasional cough during exam. Gastrointestinal: Soft and nontender. No distention.   Musculoskeletal: Nontender with normal range of motion in all extremities. Neurologic:  Normal speech and language. No gross focal neurologic deficits  Skin:  Skin is warm, dry and intact.  Psychiatric: Mood and affect are normal.   ____________________________________________    EKG  EKG viewed and interpreted by myself shows a sinus rhythm at 59 bpm with a narrow QRS, normal axis, normal intervals, no concerning ST changes.  ____________________________________________    RADIOLOGY  Chest x-ray negative  ____________________________________________   INITIAL IMPRESSION / ASSESSMENT AND PLAN / ED COURSE  Pertinent labs & imaging results that were available during my care of the patient were reviewed by me and considered in my medical decision making (see chart for details).   Patient presents emergency department for cough with yellow sputum and subjective chills over the past 2 days.  Differential would include pneumonia, bronchitis, chronic cough, COPD, pneumonia.  We will check labs, chest x-ray and continue to closely monitor.  Overall the patient appears well with reassuring vitals and a reassuring physical exam.  Patient's work-up is overall reassuring.  Chest x-ray is negative.  However given the yellow sputum production we will cover with antibiotics for possible acute bronchitis.  Patient agreeable to plan of care  [name] was evaluated in Emergency Department on 04/17/2021 for the symptoms described in the history of present illness. He was evaluated in the context of the global COVID-19 pandemic, which necessitated consideration that the patient might be at risk for infection  with the SARS-CoV-2 virus that causes COVID-19. Institutional protocols and algorithms that pertain to the evaluation of patients at risk for COVID-19 are in a state of rapid change based on information released by regulatory bodies including the CDC and federal and state organizations. These policies and algorithms  were followed during the patient's care in the ED.  ____________________________________________   FINAL CLINICAL IMPRESSION(S) / ED DIAGNOSES  Dyspnea Bronchitis    Minna Antis, MD 04/17/21 1308

## 2021-04-17 NOTE — ED Notes (Signed)
IV from right FA and left wrist removed

## 2021-04-17 NOTE — Discharge Instructions (Addendum)
Please take antibiotics as prescribed for their entire course.  Please follow-up with your doctor in the next 2 days for recheck/reevaluation.  Return to the emergency department for any shortness of breath, worsening cough, chest pain or any other symptom personally concerning to yourself.

## 2021-04-17 NOTE — ED Notes (Signed)
Daughter coming to pick up pt

## 2021-04-17 NOTE — ED Notes (Signed)
Pt unhooked and walked to bathroom for BM.

## 2021-04-17 NOTE — ED Triage Notes (Signed)
Pt comes ems from home. HX of lung cancer with recent radiation treatments. Pt having increased SOB with some fevers. 20G L arm. One duoneb given. Pt thought he had a reaction to a steroid so not given by ems. 95% RA then 100% with breathing tx.

## 2021-06-25 ENCOUNTER — Other Ambulatory Visit: Payer: Self-pay | Admitting: Adult Health

## 2021-07-19 ENCOUNTER — Other Ambulatory Visit: Payer: Self-pay | Admitting: Cardiovascular Disease

## 2021-07-19 DIAGNOSIS — I2510 Atherosclerotic heart disease of native coronary artery without angina pectoris: Secondary | ICD-10-CM

## 2021-07-19 DIAGNOSIS — E7800 Pure hypercholesterolemia, unspecified: Secondary | ICD-10-CM

## 2021-07-19 DIAGNOSIS — I10 Essential (primary) hypertension: Secondary | ICD-10-CM

## 2021-10-19 ENCOUNTER — Other Ambulatory Visit: Payer: Self-pay | Admitting: Cardiovascular Disease

## 2021-10-19 DIAGNOSIS — I2510 Atherosclerotic heart disease of native coronary artery without angina pectoris: Secondary | ICD-10-CM

## 2021-10-19 DIAGNOSIS — E7800 Pure hypercholesterolemia, unspecified: Secondary | ICD-10-CM

## 2021-10-19 DIAGNOSIS — I10 Essential (primary) hypertension: Secondary | ICD-10-CM

## 2021-11-12 ENCOUNTER — Telehealth: Payer: Self-pay | Admitting: Cardiovascular Disease

## 2021-11-12 DIAGNOSIS — E7800 Pure hypercholesterolemia, unspecified: Secondary | ICD-10-CM

## 2021-11-12 DIAGNOSIS — I10 Essential (primary) hypertension: Secondary | ICD-10-CM

## 2021-11-12 DIAGNOSIS — I2510 Atherosclerotic heart disease of native coronary artery without angina pectoris: Secondary | ICD-10-CM

## 2021-11-12 MED ORDER — ROSUVASTATIN CALCIUM 40 MG PO TABS: 40.0000 mg | ORAL_TABLET | Freq: Every day | ORAL | 0 refills | Status: DC

## 2021-11-12 NOTE — Telephone Encounter (Signed)
Pt has been scheduled to see Edd Fabian, NP, 11/30/2021.  Will send in a 30 day supply to [facility].

## 2021-11-12 NOTE — Telephone Encounter (Signed)
°*  STAT* If patient is at the pharmacy, call can be transferred to refill team.   1. Which medications need to be refilled? (please list name of each medication and dose if known) rosuvastatin (CRESTOR) 40 MG tablet  2. Which pharmacy/location (including street and city if local pharmacy) is medication to be sent to? [place], [place]  Phone:  [phone] Fax:  [phone]   3. Do they need a 30 day or 90 day supply? 90 ds

## 2021-11-30 ENCOUNTER — Encounter (HOSPITAL_BASED_OUTPATIENT_CLINIC_OR_DEPARTMENT_OTHER): Payer: Self-pay | Admitting: Nurse Practitioner

## 2021-11-30 ENCOUNTER — Ambulatory Visit (INDEPENDENT_AMBULATORY_CARE_PROVIDER_SITE_OTHER): Payer: Medicare Other | Admitting: Nurse Practitioner

## 2021-11-30 ENCOUNTER — Other Ambulatory Visit: Payer: Self-pay

## 2021-11-30 VITALS — BP 124/60 | HR 60 | Ht 68.0 in | Wt 148.0 lb

## 2021-11-30 DIAGNOSIS — F17200 Nicotine dependence, unspecified, uncomplicated: Secondary | ICD-10-CM

## 2021-11-30 DIAGNOSIS — E7800 Pure hypercholesterolemia, unspecified: Secondary | ICD-10-CM

## 2021-11-30 DIAGNOSIS — I2510 Atherosclerotic heart disease of native coronary artery without angina pectoris: Secondary | ICD-10-CM

## 2021-11-30 DIAGNOSIS — I10 Essential (primary) hypertension: Secondary | ICD-10-CM | POA: Diagnosis not present

## 2021-11-30 DIAGNOSIS — M542 Cervicalgia: Secondary | ICD-10-CM

## 2021-11-30 DIAGNOSIS — Z789 Other specified health status: Secondary | ICD-10-CM

## 2021-11-30 MED ORDER — ROSUVASTATIN CALCIUM 40 MG PO TABS: 40.0000 mg | ORAL_TABLET | Freq: Every day | ORAL | 3 refills | Status: DC

## 2021-11-30 NOTE — Progress Notes (Signed)
Cardiology Office Note:    Date:  11/30/2021   ID:  Burnett HarryGene R Arenas, DOB 11/08/1945, MRN [id number]  PCP:  [name], [name], MD   [facility] Providers Cardiologist:  [name] McAlhany, MD     Referring MD: [name], [name], MD   Chief Complaint: Overdue follow-up of CAD, hyperlipidemia  History of Present Illness:    Tatsuo R Conard NovakHinshaw is a very pleasant 76 y.o. male with a hx of CAD, tobacco abuse, HTN, COPD, left lung cancer, GERD, hyperlipidemia, scoliosis with chronic back pain.    His cardiac history dates back to 1999 when he had a stent placed in the RCA.  Repeat catheterization in 2010 revealed stable CAD.  Stress Myoview in 12/02/2012 showed no ischemia.  He was seen in the office in March 2016 with complaints of chest pain and cardiac cath on 01/11/2015 showed stable CAD.  Additional cardiac catheterization on 01/22/2018 showed stable CAD without obstruction, unchanged moderate stenosis in small to moderate caliber diagonal branch, mild plaque in LAD and LCx, patent RCA stent.  Due to recent unintentional weight loss and increase in dyspnea he was also encouraged to rule out underlying malignancy due to long history of tobacco abuse. He underwent radiation therapy for left lower lobe lung mass in 2022.   He was last seen in our office Dr. Clifton JamesMcAlhany on 02/17/2020 at which time no changes were made to treatment plan and he was advised to follow-up in 12 months. Beta blocker has been held in the setting of bradycardia.  Today, he is here with his daughter, Marcelino DusterMichelle. He lives with her. He reports he is feeling well with the exception of concerns about his lung cancer.  He had radiation of the left lung mass and is being followed by pulmonology every 3 months. He has reduced smoking to 1/2 ppd or less. He denies chest pain,  lower extremity edema, fatigue, palpitations, melena, hematuria, hemoptysis, diaphoresis, weakness, presyncope, syncope, orthopnea, and PND. He notices SOB when he gets  back pain and bends forward to rest his back. Feels it is stable. 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 Patient asks if he can drink 3 beers a few times every "quarter."  He wears a watch that monitors HR, BP and O2 sat. He reports home SBP 119-122 mmHg and DBP 70-75 mmHg.    Past Medical History:  Diagnosis Date   Anemia    Arthritis    Back pain    scoliosis   CAD (coronary artery disease)    a. stent to RCA 1999.b. Last cath 01/2018 stable -> medical therapy.   COPD (chronic obstructive pulmonary disease) (HCC)    Depression    Diverticulosis    GERD (gastroesophageal reflux disease)    GI bleed    History of colon polyps    History of hiatal hernia    Hyperkalemia    Hyperlipidemia    Hypertension    Scoliosis    Tobacco abuse     Past Surgical History:  Procedure Laterality Date   APPENDECTOMY  1957   BACK SURGERY     CARDIAC CATHETERIZATION  1999   stent placement RCA   CARDIAC CATHETERIZATION  2010   mild left CA distal stenosis, mild irregularities in RCA and LAD. patent setn in RCA   COLONOSCOPY WITH PROPOFOL N/A 06/16/2018   Procedure: COLONOSCOPY WITH PROPOFOL;  Surgeon: Norma Fredricksonoledo, Boykin Nearingeodoro K, MD;  Location: [facility];  Service: Gastroenterology;  Laterality: N/A;   CORONARY ANGIOPLASTY     CORONARY ARTERY BYPASS GRAFT     denies 09/30/19   ESOPHAGOGASTRODUODENOSCOPY (EGD) WITH PROPOFOL N/A 06/16/2018   Procedure: ESOPHAGOGASTRODUODENOSCOPY (EGD) WITH PROPOFOL;  Surgeon: Toledo, Benay Pike, MD;  Location: [facility];  Service: Gastroenterology;  Laterality: N/A;   FOOT SURGERY  1973   left   LEFT HEART CATH AND CORONARY ANGIOGRAPHY N/A 01/22/2018   Procedure: LEFT HEART CATH AND CORONARY ANGIOGRAPHY;  Surgeon: Burnell Blanks, MD;  Location: [facility];  Service: Cardiovascular;  Laterality: N/A;   LEFT HEART CATHETERIZATION WITH CORONARY  ANGIOGRAM N/A 01/11/2015   Procedure: LEFT HEART CATHETERIZATION WITH CORONARY ANGIOGRAM;  Surgeon: Burnell Blanks, MD;  Location: [facility];  Service: Cardiovascular;  Laterality: N/A;   OLECRANON BURSECTOMY Right 10/04/2019   Procedure: OLECRANON BURSA;  Surgeon: Leim Fabry, MD;  Location: [facility] OR[facility];  Service: Orthopedics;  Laterality: Right;   TRICEPS TENDON REPAIR Right 10/04/2019   Procedure: TRICEPS TENDON REPAIR AND PLECRANON BURSA EXCISION, POSSIBLE ALLOGRAFT AUGMENTATION.;  Surgeon: Leim Fabry, MD;  Location: [facility];  Service: Orthopedics;  Laterality: Right;    Current Medications: Current Meds  Medication Sig   aspirin EC 81 MG tablet Take 1 tablet (81 mg total) by mouth daily.   cyanocobalamin 500 MCG tablet Take 500 mcg by mouth 2 (two) times daily. VITAMIN B-12   donepezil (ARICEPT) 10 MG tablet TAKE ONE-HALF TABLET BY MOUTH EVERY DAY FOR DEMENTIA   escitalopram (LEXAPRO) 20 MG tablet Take 1 tablet (20 mg total) by mouth daily.   esomeprazole (NEXIUM) 40 MG capsule Take 1 capsule by mouth daily.   folic acid (FOLVITE) 1 MG tablet Take 1 mg by mouth daily.   gabapentin (NEURONTIN) 400 MG capsule Take 400 mg by mouth at bedtime.    montelukast (SINGULAIR) 10 MG tablet TAKE 1 TABLET AT BEDTIME   morphine (MS CONTIN) 15 MG 12 hr tablet Take 15 mg by mouth 2 (two) times daily.    oxyCODONE-acetaminophen (PERCOCET) 5-325 MG per tablet Take 1 tablet by mouth every 4 (four) hours as needed for moderate pain. For pain   promethazine (PHENERGAN) 25 MG tablet Take 25 mg by mouth as needed.    tiotropium (SPIRIVA HANDIHALER) 18 MCG inhalation capsule INHALE THE CONTENTS OF 1 CAPSULE AT BEDTIME AS DIRECTED PER PACKAGE INSTRUCTIONS   Vitamin D, Ergocalciferol, (DRISDOL) 50000 UNITS CAPS capsule Take 50,000 Units by mouth every Wednesday.    [DISCONTINUED] rosuvastatin (CRESTOR) 40 MG tablet Take 1 tablet (40 mg total) by mouth daily.     Allergies:   Levofloxacin   Social  History   Socioeconomic History   Marital status: Divorced    Spouse name: Not on file   Number of children: 3   Years of education: Not on file   Highest education level: Not on file  Occupational History    Employer: RETIRED  Tobacco Use   Smoking status: Every Day    Packs/day: 0.50    Years: 35.00    Pack years: 17.50    Types: Cigarettes   Smokeless tobacco: Never  Vaping Use   Vaping Use: Never used  Substance and Sexual Activity   Alcohol use: Yes    Alcohol/week: 6.0 standard drinks    Types: 6 Cans of beer per week   Drug use: No   Sexual activity: Not on file  Other Topics Concern   Not on file  Social History Narrative   Not on file   Social Determinants of Health   Financial Resource Strain: Not on file  Food Insecurity: Not on file  Transportation Needs: Not on file  Physical Activity: Not on file  Stress: Not on file  Social Connections: Not on file     Family History: The patient's family history includes Alzheimer's disease in his father; Angina in his father; Diabetes in his cousin; Heart attack in his cousin; Hyperlipidemia in his cousin; Hypertension in his cousin.  ROS:   Please see the history of present illness.   All other systems reviewed and are negative.  Labs/Other Studies Reviewed:    The following studies were reviewed today:  Echo 4/21 @ [facility]  Summary   1. Technically difficult study.    2. The left ventricle is normal in size with mildly increased wall  thickness.   3. The left ventricular systolic function is normal, LVEF is visually  estimated at > 55%.    4. There is grade II diastolic dysfunction (elevated filling pressure).    5. The right ventricle is normal in size, with normal systolic function.   Left Ventricle    The left ventricle is normal in size with mildly increased wall thickness.    The left ventricular systolic function is normal, LVEF is visually estimated  at > 55%.    There is grade II diastolic  dysfunction (elevated filling pressure).   Right Ventricle    The right ventricle is normal in size, with normal systolic function.   Left Atrium    The left atrium is normal in size.   Right Atrium    The right atrium is normal  in size.   Aortic Valve    The aortic valve is probably trileaflet with normal appearing leaflets with  probably normal excursion.    There is no significant aortic regurgitation.    There is no evidence of a significant transvalvular gradient.   Pulmonic Valve    Pulmonary valve is not well visualized.   Mitral Valve    The mitral valve leaflets are normal with normal leaflet mobility.    There is no significant mitral valve regurgitation.   Tricuspid Valve    The tricuspid valve leaflets are normal, with normal leaflet mobility.    There is trivial tricuspid regurgitation.    Pulmonary systolic pressure cannot be estimated due to insufficient TR jet.   Other Findings    Rhythm: Sinus Rhythm.   Pericardium/Pleural   There is no pericardial effusion.   Inferior Vena Cava    IVC size and inspiratory change suggest normal right atrial pressure. (0-5  mmHg).   Aorta   The aorta is normal in size in the visualized segments.    LHC 4/19   Prox RCA to Mid RCA lesion is 40% stenosed. Dist RCA lesion is 20% stenosed. Post Atrio lesion is 40% stenosed. Ost RPDA lesion is 40% stenosed. Ost Cx to Prox Cx lesion is 30% stenosed. Mid LM to Dist LM lesion is 40% stenosed. Ost 1st Diag lesion is 60% stenosed. The left ventricular systolic function is normal. LV end diastolic pressure is normal. The left ventricular ejection fraction is 55-65% by visual estimate. There is no mitral valve regurgitation.   1. Patent stent mid RCA with minimal restenosis, unchanged 2. Mild distal left main stenosis, unchanged 3. Moderate stenosis in the small to moderate caliber Diagonal branch, unchanged 4. Mild plaque in the Circumflex and LAD 5. Normal  LV  systolic function   Recommendations: His CAD is stable. I do not think his dyspnea is related to his CAD. He will continue workup for possible malignancy. He will need to see his pulmonary specialist.    CT Abdomen/Pelvis 11/06/20  IMPRESSION: 1. Diverticulosis of the descending and sigmoid colon without evidence of acute diverticulitis. 2. 2.4 cm pleural-based masslike opacity in the left lower lobe may represent round atelectasis versus lung malignancy. This could be better evaluated with PET-CT.  Recent Labs: 04/17/2021: ALT 13; B Natriuretic Peptide 120.1; BUN 22; Creatinine, Ser 0.93; Hemoglobin 11.5; Platelets 134; Potassium 3.5; Sodium 138   Recent Lipid Panel    Component Value Date/Time   CHOL 123 02/25/2018 1151   TRIG 64 02/25/2018 1151   HDL 70 02/25/2018 1151   CHOLHDL 1.8 02/25/2018 1151   CHOLHDL 4 01/09/2015 1022   VLDL 26.4 01/09/2015 1022   LDLCALC 40 02/25/2018 1151   LDLDIRECT 74.5 11/19/2013 1107     Risk Assessment/Calculations:      Physical Exam:    VS:  BP 124/60 (BP Location: Left Arm, Patient Position: Sitting, Cuff Size: Normal)    Pulse 60    Ht 5\' 8"  (1.727 m)    Wt 148 lb (67.1 kg)    BMI 22.50 kg/m     Wt Readings from Last 3 Encounters:  11/30/21 148 lb (67.1 kg)  04/17/21 145 lb (65.8 kg)  11/06/20 147 lb 11.3 oz (67 kg)     GEN:  Well nourished, well developed in no acute distress HEENT: Normal NECK: No JVD; No carotid bruits CARDIAC: RRR, no murmurs, rubs, gallops RESPIRATORY:  Clear to auscultation without rales, wheezing or rhonchi  ABDOMEN: Soft, non-tender, non-distended MUSCULOSKELETAL:  No edema; No deformity. 2+ pedal pulses, equal bilaterally SKIN: Warm and dry NEUROLOGIC:  Alert and oriented x 3 PSYCHIATRIC:  Normal affect   EKG:  EKG is ordered today.  The ekg ordered today demonstrates NSR at 60 bpm, Q waves lead II, V5, no acute change from previous.   Diagnoses:    1. Tobacco use disorder   2. Essential  hypertension   3. CAD in native artery   4. Hypercholesterolemia   5. Alcohol consumption less than one to two days per week   6. Neck pain    Assessment and Plan:     CAD without angina: He denies chest pain.  Has shortness of breath that he associates with back pain and feels it is stable.  No further ischemia evaluation warranted at this time.  He denies bleeding problems on aspirin.  Continue aspirin, statin.   Tobacco use disorder: He smokes 1/2 pack/day which is a decrease for him. Plans to decrease further.  He declines prescription for patches.  Complete cessation advised.   Hyperlipidemia LDL < 70: LDL 40 in 2019. Will recheck lipids and lfts today. Continue rosuvastatin.   Essential hypertension: Blood pressure is stable today.  Had hypotension a few months ago and was advised to stop valsartan. He reports home BP is good. Will get basic metabolic panel today. No changes.   Neck pain: Has pain in his neck under his ear. He is concerned about blockages because they have never been checked. Will get a carotid ultrasound to r/o stenosis of vertebral or carotid arteries.   Alcohol consumption: Reports 3 beer consumption a few times "per quarter." Reviewed guidelines from [facility] and encouraged him not to exceed 2 12 oz beers in a 24 hour period. Advised increased  consumption can result in heart damage and increases the likelihood of developing atrial fibrillation. Complete cessation advised.    Disposition: 6 months with Dr. Angelena Form or APP     Medication Adjustments/Labs and Tests Ordered: Current medicines are reviewed at length with the patient today.  Concerns regarding medicines are outlined above.  Orders Placed This Encounter  Procedures   Lipid panel   Comprehensive metabolic panel   EKG [id number]   VAS US CAROTID   Meds ordered this encounter  Medications   rosuvastatin (CRESTOR) 40 MG tablet    Sig: Take 1 tablet (40 mg total) by mouth daily.    Dispense:  90 tablet     Refill:  3    WILL OK 30 DAYS SUPPLY .Marland Kitchen MUST KEEP APPT SCHEDULED 2/17 FOR FURTHER REFILLS    Patient Instructions  Medication Instructions:  Your Physician recommend you continue on your current medication as directed.    *If you need a refill on your cardiac medications before your next appointment, please call your pharmacy*   Lab Work: Your physician recommends that you return for lab work Today- CMET, LIPID Panel  If you have labs (blood work) drawn today and your tests are completely normal, you will receive your results only by: MyChart Message (if you have [place]) OR A paper copy in the mail If you have any lab test that is abnormal or we need to change your treatment, we will call you to review the results.   Testing/Procedures: Your physician has requested that you have a carotid duplex. This test is an ultrasound of the carotid arteries in your neck. It looks at blood flow through these arteries that supply the brain with blood. Allow one hour for this exam. There are no restrictions or special instructions.    Follow-Up: At [facility], you and your health needs are our priority.  As part of our continuing mission to provide you with exceptional heart care, we have created designated Provider Care Teams.  These Care Teams include your primary Cardiologist (physician) and Advanced Practice Providers (APPs -  Physician Assistants and Nurse Practitioners) who all work together to provide you with the care you need, when you need it.  We recommend signing up for the patient portal called "MyChart".  Sign up information is provided on this After Visit Summary.  MyChart is used to connect with patients for Virtual Visits (Telemedicine).  Patients are able to view lab/test results, encounter notes, upcoming appointments, etc.  Non-urgent messages can be sent to your provider as well.   To learn more about what you can do with MyChart, go to [email or web address].    Your next  appointment:   6 month(s)  The format for your next appointment:   In Person  Provider:   Lauree Chandler, MD  or Christen Bame, NP{    Signed, Airen Stiehl, Lanice Schwab, NP  11/30/2021 4:55 PM    [place]

## 2021-11-30 NOTE — Patient Instructions (Signed)
Medication Instructions:  Your Physician recommend you continue on your current medication as directed.    *If you need a refill on your cardiac medications before your next appointment, please call your pharmacy*   Lab Work: Your physician recommends that you return for lab work Today- CMET, LIPID Panel  If you have labs (blood work) drawn today and your tests are completely normal, you will receive your results only by: MyChart Message (if you have MyChart) OR A paper copy in the mail If you have any lab test that is abnormal or we need to change your treatment, we will call you to review the results.   Testing/Procedures: Your physician has requested that you have a carotid duplex. This test is an ultrasound of the carotid arteries in your neck. It looks at blood flow through these arteries that supply the brain with blood. Allow one hour for this exam. There are no restrictions or special instructions.    Follow-Up: At [facility], you and your health needs are our priority.  As part of our continuing mission to provide you with exceptional heart care, we have created designated Provider Care Teams.  These Care Teams include your primary Cardiologist (physician) and Advanced Practice Providers (APPs -  Physician Assistants and Nurse Practitioners) who all work together to provide you with the care you need, when you need it.  We recommend signing up for the patient portal called "MyChart".  Sign up information is provided on this After Visit Summary.  MyChart is used to connect with patients for Virtual Visits (Telemedicine).  Patients are able to view lab/test results, encounter notes, upcoming appointments, etc.  Non-urgent messages can be sent to your provider as well.   To learn more about what you can do with MyChart, go to [email or web address].    Your next appointment:   6 month(s)  The format for your next appointment:   In Person  Provider:   Verne Carrow,  MD  or Eligha Bridegroom, NP{

## 2021-12-01 LAB — LIPID PANEL
Chol/HDL Ratio: 2.9 ratio (ref 0.0–5.0)
Cholesterol, Total: 113 mg/dL (ref 100–199)
HDL: 39 mg/dL — ABNORMAL LOW (ref >39)
LDL Chol Calc (NIH): 55 mg/dL (ref 0–99)
Triglycerides: 104 mg/dL (ref 0–149)
VLDL Cholesterol Cal: 19 mg/dL (ref 5–40)

## 2021-12-01 LAB — COMPREHENSIVE METABOLIC PANEL
ALT: 10 IU/L (ref 0–44)
AST: 16 IU/L (ref 0–40)
Albumin/Globulin Ratio: 1.9 (ref 1.2–2.2)
Albumin: 4.2 g/dL (ref 3.7–4.7)
Alkaline Phosphatase: 89 IU/L (ref 44–121)
BUN/Creatinine Ratio: 12 (ref 10–24)
BUN: 14 mg/dL (ref 8–27)
Bilirubin Total: 0.3 mg/dL (ref 0.0–1.2)
CO2: 25 mmol/L (ref 20–29)
Calcium: 9 mg/dL (ref 8.6–10.2)
Chloride: 103 mmol/L (ref 96–106)
Creatinine, Ser: 1.14 mg/dL (ref 0.76–1.27)
Globulin, Total: 2.2 g/dL (ref 1.5–4.5)
Glucose: 82 mg/dL (ref 70–99)
Potassium: 4.4 mmol/L (ref 3.5–5.2)
Sodium: 139 mmol/L (ref 134–144)
Total Protein: 6.4 g/dL (ref 6.0–8.5)
eGFR: 66 mL/min/{1.73_m2} (ref >59)

## 2021-12-19 ENCOUNTER — Other Ambulatory Visit (HOSPITAL_BASED_OUTPATIENT_CLINIC_OR_DEPARTMENT_OTHER): Payer: Self-pay | Admitting: Nurse Practitioner

## 2021-12-19 DIAGNOSIS — M542 Cervicalgia: Secondary | ICD-10-CM

## 2021-12-24 ENCOUNTER — Other Ambulatory Visit: Payer: Self-pay

## 2021-12-24 ENCOUNTER — Ambulatory Visit (HOSPITAL_COMMUNITY)
Admission: RE | Admit: 2021-12-24 | Discharge: 2021-12-24 | Disposition: A | Discharge status: Home / Self Care | Payer: Medicare Other | Source: Ambulatory Visit | Attending: Cardiovascular Disease | Admitting: Cardiovascular Disease

## 2021-12-24 ENCOUNTER — Encounter (HOSPITAL_COMMUNITY): Payer: Self-pay

## 2021-12-24 DIAGNOSIS — R221 Localized swelling, mass and lump, neck: Secondary | ICD-10-CM

## 2021-12-24 DIAGNOSIS — M542 Cervicalgia: Secondary | ICD-10-CM | POA: Diagnosis not present

## 2022-03-06 ENCOUNTER — Emergency Department
Admission: EM | Admit: 2022-03-06 | Discharge: 2022-03-06 | Disposition: A | Discharge status: Home / Self Care | Payer: Medicare Other | Attending: Emergency Medicine | Admitting: Emergency Medicine

## 2022-03-06 ENCOUNTER — Other Ambulatory Visit: Payer: Self-pay

## 2022-03-06 ENCOUNTER — Emergency Department: Payer: Medicare Other

## 2022-03-06 DIAGNOSIS — R112 Nausea with vomiting, unspecified: Secondary | ICD-10-CM | POA: Diagnosis not present

## 2022-03-06 DIAGNOSIS — I10 Essential (primary) hypertension: Secondary | ICD-10-CM | POA: Diagnosis not present

## 2022-03-06 DIAGNOSIS — R1084 Generalized abdominal pain: Secondary | ICD-10-CM | POA: Diagnosis present

## 2022-03-06 DIAGNOSIS — Z859 Personal history of malignant neoplasm, unspecified: Secondary | ICD-10-CM | POA: Diagnosis not present

## 2022-03-06 LAB — COMPREHENSIVE METABOLIC PANEL
ALT: 12 U/L (ref 0–44)
AST: 20 U/L (ref 15–41)
Albumin: 3.9 g/dL (ref 3.5–5.0)
Alkaline Phosphatase: 77 U/L (ref 38–126)
Anion gap: 10 (ref 5–15)
BUN: 27 mg/dL — ABNORMAL HIGH (ref 8–23)
CO2: 24 mmol/L (ref 22–32)
Calcium: 9.7 mg/dL (ref 8.9–10.3)
Chloride: 105 mmol/L (ref 98–111)
Creatinine, Ser: 0.82 mg/dL (ref 0.61–1.24)
GFR, Estimated: >60 mL/min (ref >60)
Glucose, Bld: 103 mg/dL — ABNORMAL HIGH (ref 70–99)
Potassium: 3.5 mmol/L (ref 3.5–5.1)
Sodium: 139 mmol/L (ref 135–145)
Total Bilirubin: 1 mg/dL (ref 0.3–1.2)
Total Protein: 7.8 g/dL (ref 6.5–8.1)

## 2022-03-06 LAB — CBC
HCT: 36.9 % — ABNORMAL LOW (ref 39.0–52.0)
Hemoglobin: 12.1 g/dL — ABNORMAL LOW (ref 13.0–17.0)
MCH: 32 pg (ref 26.0–34.0)
MCHC: 32.8 g/dL (ref 30.0–36.0)
MCV: 97.6 fL (ref 80.0–100.0)
Platelets: 178 10*3/uL (ref 150–400)
RBC: 3.78 MIL/uL — ABNORMAL LOW (ref 4.22–5.81)
RDW: 12.8 % (ref 11.5–15.5)
WBC: 5.9 10*3/uL (ref 4.0–10.5)
nRBC: 0 % (ref 0.0–0.2)

## 2022-03-06 LAB — URINALYSIS, ROUTINE W REFLEX MICROSCOPIC
Bacteria, UA: NONE SEEN
Bilirubin Urine: NEGATIVE
Glucose, UA: NEGATIVE mg/dL
Hgb urine dipstick: NEGATIVE
Ketones, ur: 20 mg/dL — AB
Leukocytes,Ua: NEGATIVE
Nitrite: NEGATIVE
Protein, ur: 30 mg/dL — AB
Specific Gravity, Urine: >1.046 — ABNORMAL HIGH (ref 1.005–1.030)
pH: 6 (ref 5.0–8.0)

## 2022-03-06 LAB — LIPASE, BLOOD: Lipase: 30 U/L (ref 11–51)

## 2022-03-06 MED ORDER — ONDANSETRON 4 MG PO TBDP
4.0000 mg | ORAL_TABLET | Freq: Once | ORAL | Status: AC
  Administered 2022-03-06: 4 mg via ORAL
  Filled 2022-03-06: qty 1

## 2022-03-06 MED ORDER — DICYCLOMINE HCL 10 MG PO CAPS
10.0000 mg | ORAL_CAPSULE | Freq: Once | ORAL | Status: AC
  Administered 2022-03-06: 10 mg via ORAL
  Filled 2022-03-06: qty 1

## 2022-03-06 MED ORDER — IOHEXOL 300 MG/ML  SOLN
100.0000 mL | Freq: Once | INTRAMUSCULAR | Status: AC | PRN
  Administered 2022-03-06: 100 mL via INTRAVENOUS

## 2022-03-06 NOTE — ED Provider Triage Note (Signed)
Emergency Medicine Provider Triage Evaluation Note  [name] , a 76 y.o. male  was evaluated in triage.  Pt complains of abdominal pain below the umbilicus. Also feels nauseated but has not vomited. No fever.  Review of Systems  Positive: Abdominal pain Negative: Fever, nausea, vomiting  Physical Exam  There were no vitals taken for this visit. Gen:   Awake, no distress   Resp:  Normal effort  MSK:   Moves extremities without difficulty  Other:    Medical Decision Making  Medically screening exam initiated at 2:46 PM.  Appropriate orders placed.  [name] was informed that the remainder of the evaluation will be completed by another provider, this initial triage assessment does not replace that evaluation, and the importance of remaining in the ED until their evaluation is complete.    Chinita Pester, FNP 03/07/22 1643

## 2022-03-06 NOTE — Discharge Instructions (Signed)
Please seek medical attention for any high fevers, chest pain, shortness of breath, change in behavior, persistent vomiting, bloody stool or any other new or concerning symptoms.  

## 2022-03-06 NOTE — ED Triage Notes (Signed)
Pt come with c/o abdminal pain for 3 days. Pt denies bm for 1 day. Pt states N/V. Pt stats mid lower belly pain. Pt states 10/10 pain BP-160/90 HR-50 O2-98% RA

## 2022-03-06 NOTE — ED Provider Notes (Signed)
[facility] Provider Note    Event Date/Time   First MD Initiated Contact with Patient 03/06/22 1654     (approximate)   History   Abdominal Pain   HPI  Stefano R Seda is a 76 y.o. male  who, per clinic note dated 01/03/22 has history of emphysema, HTN, depression, cancer, who presents to the emergency department today because of concern for abdominal pain. The patient states that the pain started three days ago.  It has been fairly constant since then.  He has had some nausea and vomiting.  He states that he has had an appendectomy when he was much younger.  He denies any fevers.  Physical Exam   Triage Vital Signs: ED Triage Vitals  Enc Vitals Group     BP 03/06/22 1449 (!) 171/85     Pulse Rate 03/06/22 1449 69     Resp 03/06/22 1449 18     Temp 03/06/22 1449 98 F (36.7 C)     Temp src --      SpO2 03/06/22 1449 94 %     Weight --      Height --      Head Circumference --      Peak Flow --      Pain Score 03/06/22 1447 10     Pain Loc --      Pain Edu? --      Excl. in GC? --     Most recent vital signs: Vitals:   03/06/22 1449  BP: (!) 171/85  Pulse: 69  Resp: 18  Temp: 98 F (36.7 C)  SpO2: 94%    General: Awake, alert and oriented. CV:  Good peripheral perfusion. No m/r/g appreciated, regular rate and rhythm. Resp:  Normal effort. Lungs clear Abd:  No distention. Diffusely tender to palpation.    ED Results / Procedures / Treatments   Labs (all labs ordered are listed, but only abnormal results are displayed) Labs Reviewed  COMPREHENSIVE METABOLIC PANEL - Abnormal; Notable for the following components:      Result Value   Glucose, Bld 103 (*)    BUN 27 (*)    All other components within normal limits  CBC - Abnormal; Notable for the following components:   RBC 3.78 (*)    Hemoglobin 12.1 (*)    HCT 36.9 (*)    All other components within normal limits  URINALYSIS, ROUTINE W REFLEX MICROSCOPIC - Abnormal; Notable  for the following components:   Color, Urine YELLOW (*)    APPearance CLEAR (*)    Specific Gravity, Urine >1.046 (*)    Ketones, ur 20 (*)    Protein, ur 30 (*)    All other components within normal limits  LIPASE, BLOOD     EKG  None   RADIOLOGY I independently interpreted and visualized the CT abd/pel. My interpretation: No free air Radiology interpretation:  IMPRESSION:  1. No acute abnormality identified in the abdomen or pelvis.  2. Hepatic steatosis.  3. Colonic diverticulosis.  4. Aortic Atherosclerosis (ICD10-I70.0) and Emphysema (ICD10-J43.9).         PROCEDURES:  Critical Care performed: No  Procedures   MEDICATIONS ORDERED IN ED: Medications  ondansetron (ZOFRAN-ODT) disintegrating tablet 4 mg (4 mg Oral Given 03/06/22 1453)     IMPRESSION / MDM / ASSESSMENT AND PLAN / ED COURSE  I reviewed the triage vital signs and the nursing notes.  Differential diagnosis includes, but is not limited to, diverticulitis, gastroenteritis, gallbladder disease, obstruction, intussusception.  Patient presented to the emergency department today because of concerns for abdominal pain.  On exam he is diffusely tender to palpation.  Afebrile and without leukocytosis however given age and clinical exam did obtain a CT scan.  This did not show any acute findings.  Patient did feel better after dose of Bentyl.  Given clinical improvement and reassuring findings I do not feel patient necessitates inpatient mission at this time.  Will discharge and will give GI follow-up information.   FINAL CLINICAL IMPRESSION(S) / ED DIAGNOSES   Final diagnoses:  Generalized abdominal pain     Note:  This document was prepared using Dragon voice recognition software and may include unintentional dictation errors.    Phineas Semen, MD 03/06/22 2023

## 2022-11-18 ENCOUNTER — Other Ambulatory Visit (HOSPITAL_BASED_OUTPATIENT_CLINIC_OR_DEPARTMENT_OTHER): Payer: Self-pay | Admitting: Nurse Practitioner

## 2022-11-18 DIAGNOSIS — I2510 Atherosclerotic heart disease of native coronary artery without angina pectoris: Secondary | ICD-10-CM

## 2022-11-18 DIAGNOSIS — E7800 Pure hypercholesterolemia, unspecified: Secondary | ICD-10-CM

## 2022-11-18 DIAGNOSIS — I10 Essential (primary) hypertension: Secondary | ICD-10-CM

## 2022-11-19 NOTE — Telephone Encounter (Signed)
Patient of Dr. Clydene Fake. Please review for refill. Thank you!

## 2023-01-02 ENCOUNTER — Other Ambulatory Visit: Payer: Self-pay

## 2023-01-02 ENCOUNTER — Emergency Department: Payer: Medicare Other

## 2023-01-02 ENCOUNTER — Inpatient Hospital Stay
Admission: EM | Admit: 2023-01-02 | Discharge: 2023-01-04 | DRG: 177 | Disposition: A | Discharge status: Home Health | Payer: Medicare Other | Attending: Osteopathic Medicine | Admitting: Osteopathic Medicine

## 2023-01-02 DIAGNOSIS — F17210 Nicotine dependence, cigarettes, uncomplicated: Secondary | ICD-10-CM | POA: Diagnosis present

## 2023-01-02 DIAGNOSIS — Z951 Presence of aortocoronary bypass graft: Secondary | ICD-10-CM | POA: Diagnosis not present

## 2023-01-02 DIAGNOSIS — D649 Anemia, unspecified: Secondary | ICD-10-CM | POA: Diagnosis present

## 2023-01-02 DIAGNOSIS — Z881 Allergy status to other antibiotic agents status: Secondary | ICD-10-CM

## 2023-01-02 DIAGNOSIS — J690 Pneumonitis due to inhalation of food and vomit: Principal | ICD-10-CM | POA: Diagnosis present

## 2023-01-02 DIAGNOSIS — Z8719 Personal history of other diseases of the digestive system: Secondary | ICD-10-CM

## 2023-01-02 DIAGNOSIS — Z79899 Other long term (current) drug therapy: Secondary | ICD-10-CM

## 2023-01-02 DIAGNOSIS — J441 Chronic obstructive pulmonary disease with (acute) exacerbation: Secondary | ICD-10-CM

## 2023-01-02 DIAGNOSIS — K219 Gastro-esophageal reflux disease without esophagitis: Secondary | ICD-10-CM | POA: Insufficient documentation

## 2023-01-02 DIAGNOSIS — E785 Hyperlipidemia, unspecified: Secondary | ICD-10-CM | POA: Diagnosis present

## 2023-01-02 DIAGNOSIS — J440 Chronic obstructive pulmonary disease with acute lower respiratory infection: Secondary | ICD-10-CM | POA: Diagnosis not present

## 2023-01-02 DIAGNOSIS — F32A Depression, unspecified: Secondary | ICD-10-CM | POA: Diagnosis present

## 2023-01-02 DIAGNOSIS — Z955 Presence of coronary angioplasty implant and graft: Secondary | ICD-10-CM

## 2023-01-02 DIAGNOSIS — Z1152 Encounter for screening for COVID-19: Secondary | ICD-10-CM | POA: Diagnosis not present

## 2023-01-02 DIAGNOSIS — F0393 Unspecified dementia, unspecified severity, with mood disturbance: Secondary | ICD-10-CM | POA: Diagnosis present

## 2023-01-02 DIAGNOSIS — E876 Hypokalemia: Secondary | ICD-10-CM | POA: Diagnosis present

## 2023-01-02 DIAGNOSIS — J9601 Acute respiratory failure with hypoxia: Secondary | ICD-10-CM | POA: Diagnosis present

## 2023-01-02 DIAGNOSIS — Z7982 Long term (current) use of aspirin: Secondary | ICD-10-CM | POA: Diagnosis not present

## 2023-01-02 DIAGNOSIS — R590 Localized enlarged lymph nodes: Secondary | ICD-10-CM

## 2023-01-02 DIAGNOSIS — S2242XA Multiple fractures of ribs, left side, initial encounter for closed fracture: Secondary | ICD-10-CM | POA: Diagnosis present

## 2023-01-02 DIAGNOSIS — I2510 Atherosclerotic heart disease of native coronary artery without angina pectoris: Secondary | ICD-10-CM | POA: Diagnosis present

## 2023-01-02 DIAGNOSIS — F0390 Unspecified dementia without behavioral disturbance: Secondary | ICD-10-CM | POA: Diagnosis not present

## 2023-01-02 DIAGNOSIS — W19XXXA Unspecified fall, initial encounter: Secondary | ICD-10-CM | POA: Diagnosis present

## 2023-01-02 DIAGNOSIS — J189 Pneumonia, unspecified organism: Secondary | ICD-10-CM | POA: Diagnosis present

## 2023-01-02 DIAGNOSIS — Z8249 Family history of ischemic heart disease and other diseases of the circulatory system: Secondary | ICD-10-CM | POA: Diagnosis not present

## 2023-01-02 DIAGNOSIS — I10 Essential (primary) hypertension: Secondary | ICD-10-CM

## 2023-01-02 DIAGNOSIS — Z85118 Personal history of other malignant neoplasm of bronchus and lung: Secondary | ICD-10-CM

## 2023-01-02 DIAGNOSIS — Z833 Family history of diabetes mellitus: Secondary | ICD-10-CM

## 2023-01-02 DIAGNOSIS — J449 Chronic obstructive pulmonary disease, unspecified: Secondary | ICD-10-CM

## 2023-01-02 DIAGNOSIS — J4489 Other specified chronic obstructive pulmonary disease: Secondary | ICD-10-CM

## 2023-01-02 LAB — COMPREHENSIVE METABOLIC PANEL
ALT: 12 U/L (ref 0–44)
AST: 18 U/L (ref 15–41)
Albumin: 3.5 g/dL (ref 3.5–5.0)
Alkaline Phosphatase: 99 U/L (ref 38–126)
Anion gap: 8 (ref 5–15)
BUN: 22 mg/dL (ref 8–23)
CO2: 26 mmol/L (ref 22–32)
Calcium: 9.3 mg/dL (ref 8.9–10.3)
Chloride: 102 mmol/L (ref 98–111)
Creatinine, Ser: 1.03 mg/dL (ref 0.61–1.24)
GFR, Estimated: >60 mL/min (ref >60)
Glucose, Bld: 125 mg/dL — ABNORMAL HIGH (ref 70–99)
Potassium: 3.2 mmol/L — ABNORMAL LOW (ref 3.5–5.1)
Sodium: 136 mmol/L (ref 135–145)
Total Bilirubin: 0.8 mg/dL (ref 0.3–1.2)
Total Protein: 7.2 g/dL (ref 6.5–8.1)

## 2023-01-02 LAB — CBC
HCT: 35.3 % — ABNORMAL LOW (ref 39.0–52.0)
Hemoglobin: 11.8 g/dL — ABNORMAL LOW (ref 13.0–17.0)
MCH: 33.3 pg (ref 26.0–34.0)
MCHC: 33.4 g/dL (ref 30.0–36.0)
MCV: 99.7 fL (ref 80.0–100.0)
Platelets: 140 10*3/uL — ABNORMAL LOW (ref 150–400)
RBC: 3.54 MIL/uL — ABNORMAL LOW (ref 4.22–5.81)
RDW: 13.7 % (ref 11.5–15.5)
WBC: 7.8 10*3/uL (ref 4.0–10.5)
nRBC: 0 % (ref 0.0–0.2)

## 2023-01-02 LAB — RESP PANEL BY RT-PCR (RSV, FLU A&B, COVID)  RVPGX2
Influenza A by PCR: NEGATIVE
Influenza B by PCR: NEGATIVE
Resp Syncytial Virus by PCR: NEGATIVE
SARS Coronavirus 2 by RT PCR: NEGATIVE

## 2023-01-02 LAB — LACTIC ACID, PLASMA: Lactic Acid, Venous: 1.2 mmol/L (ref 0.5–1.9)

## 2023-01-02 LAB — TROPONIN I (HIGH SENSITIVITY): Troponin I (High Sensitivity): 14 ng/L (ref <18)

## 2023-01-02 MED ORDER — HYDROCOD POLI-CHLORPHE POLI ER 10-8 MG/5ML PO SUER
5.0000 mL | Freq: Two times a day (BID) | ORAL | Status: DC | PRN
  Administered 2023-01-03: 5 mL via ORAL
  Filled 2023-01-02: qty 5

## 2023-01-02 MED ORDER — ACETAMINOPHEN 325 MG PO TABS
650.0000 mg | ORAL_TABLET | Freq: Four times a day (QID) | ORAL | Status: DC | PRN
  Administered 2023-01-03: 650 mg via ORAL
  Filled 2023-01-02: qty 2

## 2023-01-02 MED ORDER — TRAZODONE HCL 50 MG PO TABS: 25.0000 mg | ORAL_TABLET | Freq: Every evening | ORAL | Status: DC | PRN

## 2023-01-02 MED ORDER — VANCOMYCIN HCL IN DEXTROSE 1-5 GM/200ML-% IV SOLN: 1000.0000 mg | Freq: Once | INTRAVENOUS | Status: DC

## 2023-01-02 MED ORDER — IPRATROPIUM-ALBUTEROL 0.5-2.5 (3) MG/3ML IN SOLN
3.0000 mL | Freq: Four times a day (QID) | RESPIRATORY_TRACT | Status: DC
  Administered 2023-01-03: 3 mL via RESPIRATORY_TRACT
  Filled 2023-01-02: qty 3

## 2023-01-02 MED ORDER — METRONIDAZOLE 500 MG/100ML IV SOLN
500.0000 mg | Freq: Once | INTRAVENOUS | Status: AC
  Administered 2023-01-02: 500 mg via INTRAVENOUS
  Filled 2023-01-02: qty 100

## 2023-01-02 MED ORDER — SODIUM CHLORIDE 0.9 % IV SOLN
500.0000 mg | INTRAVENOUS | Status: DC
  Administered 2023-01-03 – 2023-01-04 (×2): 500 mg via INTRAVENOUS
  Filled 2023-01-02: qty 5
  Filled 2023-01-02: qty 500

## 2023-01-02 MED ORDER — MAGNESIUM HYDROXIDE 400 MG/5ML PO SUSP: 30.0000 mL | Freq: Every day | ORAL | Status: DC | PRN

## 2023-01-02 MED ORDER — ONDANSETRON HCL 4 MG/2ML IJ SOLN
4.0000 mg | Freq: Once | INTRAMUSCULAR | Status: AC
  Administered 2023-01-02: 4 mg via INTRAVENOUS
  Filled 2023-01-02: qty 2

## 2023-01-02 MED ORDER — GUAIFENESIN ER 600 MG PO TB12
600.0000 mg | ORAL_TABLET | Freq: Two times a day (BID) | ORAL | Status: DC
  Administered 2023-01-03 – 2023-01-04 (×4): 600 mg via ORAL
  Filled 2023-01-02 (×4): qty 1

## 2023-01-02 MED ORDER — ONDANSETRON HCL 4 MG PO TABS: 4.0000 mg | ORAL_TABLET | Freq: Four times a day (QID) | ORAL | Status: DC | PRN

## 2023-01-02 MED ORDER — ACETAMINOPHEN 650 MG RE SUPP: 650.0000 mg | Freq: Four times a day (QID) | RECTAL | Status: DC | PRN

## 2023-01-02 MED ORDER — POTASSIUM CHLORIDE IN NACL 20-0.9 MEQ/L-% IV SOLN
INTRAVENOUS | Status: DC
  Filled 2023-01-02 (×3): qty 1000

## 2023-01-02 MED ORDER — ONDANSETRON HCL 4 MG/2ML IJ SOLN: 4.0000 mg | Freq: Four times a day (QID) | INTRAMUSCULAR | Status: DC | PRN

## 2023-01-02 MED ORDER — VANCOMYCIN HCL 1250 MG/250ML IV SOLN
1250.0000 mg | Freq: Once | INTRAVENOUS | Status: AC
  Administered 2023-01-03: 1250 mg via INTRAVENOUS
  Filled 2023-01-02: qty 250

## 2023-01-02 MED ORDER — ENOXAPARIN SODIUM 40 MG/0.4ML IJ SOSY
40.0000 mg | PREFILLED_SYRINGE | INTRAMUSCULAR | Status: DC
  Administered 2023-01-03 – 2023-01-04 (×2): 40 mg via SUBCUTANEOUS
  Filled 2023-01-02 (×2): qty 0.4

## 2023-01-02 MED ORDER — SODIUM CHLORIDE 0.9 % IV BOLUS
1000.0000 mL | Freq: Once | INTRAVENOUS | Status: AC
  Administered 2023-01-02: 1000 mL via INTRAVENOUS

## 2023-01-02 MED ORDER — SODIUM CHLORIDE 0.9 % IV SOLN
2.0000 g | Freq: Once | INTRAVENOUS | Status: AC
  Administered 2023-01-02: 2 g via INTRAVENOUS
  Filled 2023-01-02: qty 12.5

## 2023-01-02 MED ORDER — SODIUM CHLORIDE 0.9 % IV SOLN
3.0000 g | Freq: Four times a day (QID) | INTRAVENOUS | Status: DC
  Administered 2023-01-03 – 2023-01-04 (×5): 3 g via INTRAVENOUS
  Filled 2023-01-02 (×3): qty 8
  Filled 2023-01-02: qty 3
  Filled 2023-01-02 (×2): qty 8
  Filled 2023-01-02: qty 3

## 2023-01-02 NOTE — Consult Note (Signed)
PHARMACY -  BRIEF ANTIBIOTIC NOTE   Pharmacy has received consult(s) for vancomycin and cefepime from an ED provider.  The patient's profile has been reviewed for ht/wt/allergies/indication/available labs.    One time order(s) placed for cefepime 2g IV x1 and vancomycin 1250mg  IV x1  Further antibiotics/pharmacy consults should be ordered by admitting physician if indicated.                       Thank you, Darrick Penna 01/02/2023  9:16 PM

## 2023-01-02 NOTE — Assessment & Plan Note (Signed)
-   Will continue PPI therapy. 

## 2023-01-02 NOTE — Assessment & Plan Note (Signed)
-   Will continue his Aricept.

## 2023-01-02 NOTE — H&P (Addendum)
[facility]   PATIENT NAME: [name]    MR#:  [id number]  DATE OF BIRTH:  1946-04-03  DATE OF ADMISSION:  01/02/2023  PRIMARY CARE PHYSICIAN: Tracie Harrier, MD   Patient is coming from: Home  REQUESTING/REFERRING PHYSICIAN: Harvest Dark, MD  CHIEF COMPLAINT:   Chief Complaint  Patient presents with   Fall    HISTORY OF PRESENT ILLNESS:  [name] is a 77 y.o. Caucasian male with medical history significant for osteoarthritis, coronary artery disease status post PCI and statin to RCA, COPD, depression, diverticulosis, GERD, GI bleeding, dyslipidemia and tobacco abuse, who presented to the emergency room with acute onset of fall.  The patient had an accidental fall from his Bobcat while he was jumping down from it 4 days ago and landed on his left chest.  He admitted to having vomiting after his fall.  He has been having mild altered mental status per his daughter over the last couple of days.  He lives by himself and his daughter was checking on him today when she found him on the bathroom floor.  It is unclear if he had a syncope or not.  He denied any paresthesias or focal muscle weakness.  He has been having dyspnea with associated cough and wheezing.  He denied any dysphagia or dysarthria.  No facial droop or numbness.  No tinnitus or vertigo.  No chest pain or palpitations.  No dysuria, oliguria or hematuria or flank pain.  No bleeding diathesis. No reported fever or chills.  He denies any dysphagia or dysarthria.  ED Course: When he came to the ER, BP was 146/56 with a respiratory to 22 pulse currently was 96% on 2 L of O2 via nasal cannula.  Labs revealed mild hypokalemia 3.2 with otherwise unremarkable CMP. EKG as reviewed by me : Sinus rhythm with a rate of 92 with right atrial enlargement, prolonged QT interval with QTc of 526 and mass  Imaging: Chest x-ray showed no acute disease.  It showed atherosclerosis of the aorta and emphysema. Noncontrasted  head CT scan revealed minimal white matter disease with no acute intracranial abnormality. Chest CT without contrast revealed the following: 1. Acute minimally displaced left fifth and sixth anterior rib fractures. No visible pneumothorax. 2. Emphysema. Focal subpleural consolidation in the left lower lobe with possible central cavitation, question cavitary pneumonia versus cavitary mass. Surrounding ground-glass density is probably inflammatory/infectious. Recommend short interval follow-up CT to ensure resolution. 3. Mediastinal adenopathy, slightly increased since most recent CT, possibly reactive. This may also be assessed on CT follow-up. 4. Aortic atherosclerosis.  The patient was given IV cefepime, vancomycin and Flagyl, 1 L bolus of IV normal saline and 4 mg of IV Zofran.  He will be admitted to a medical telemetry bed for further evaluation and management.  PAST MEDICAL HISTORY:   Past Medical History:  Diagnosis Date   Anemia    Arthritis    Back pain    scoliosis   CAD (coronary artery disease)    a. stent to RCA 1999.b. Last cath 01/2018 stable -> medical therapy.   COPD (chronic obstructive pulmonary disease) (HCC)    Depression    Diverticulosis    GERD (gastroesophageal reflux disease)    GI bleed    History of colon polyps    History of hiatal hernia    Hyperkalemia    Hyperlipidemia    Hypertension    Scoliosis    Tobacco abuse  PAST SURGICAL HISTORY:   Past Surgical History:  Procedure Laterality Date   [place]   stent placement RCA   CARDIAC CATHETERIZATION  2010   mild left CA distal stenosis, mild irregularities in RCA and LAD. patent setn in RCA   COLONOSCOPY WITH PROPOFOL N/A 06/16/2018   Procedure: COLONOSCOPY WITH PROPOFOL;  Surgeon: Toledo, Benay Pike, MD;  Location: [facility] ENDOSCOPY;  Service: Gastroenterology;  Laterality: N/A;   CORONARY ANGIOPLASTY     CORONARY ARTERY BYPASS  GRAFT     denies 09/30/19   ESOPHAGOGASTRODUODENOSCOPY (EGD) WITH PROPOFOL N/A 06/16/2018   Procedure: ESOPHAGOGASTRODUODENOSCOPY (EGD) WITH PROPOFOL;  Surgeon: Toledo, Benay Pike, MD;  Location: [facility] ENDOSCOPY;  Service: Gastroenterology;  Laterality: N/A;   FOOT SURGERY  1973   left   LEFT HEART CATH AND CORONARY ANGIOGRAPHY N/A 01/22/2018   Procedure: LEFT HEART CATH AND CORONARY ANGIOGRAPHY;  Surgeon: Burnell Blanks, MD;  Location: [facility];  Service: Cardiovascular;  Laterality: N/A;   LEFT HEART CATHETERIZATION WITH CORONARY ANGIOGRAM N/A 01/11/2015   Procedure: LEFT HEART CATHETERIZATION WITH CORONARY ANGIOGRAM;  Surgeon: Burnell Blanks, MD;  Location: [facility] CATH LAB;  Service: Cardiovascular;  Laterality: N/A;   OLECRANON BURSECTOMY Right 10/04/2019   Procedure: OLECRANON BURSA;  Surgeon: Leim Fabry, MD;  Location: [facility] ORS;  Service: Orthopedics;  Laterality: Right;   TRICEPS TENDON REPAIR Right 10/04/2019   Procedure: TRICEPS TENDON REPAIR AND PLECRANON BURSA EXCISION, POSSIBLE ALLOGRAFT AUGMENTATION.;  Surgeon: Leim Fabry, MD;  Location: [facility] ORS;  Service: Orthopedics;  Laterality: Right;    SOCIAL HISTORY:   Social History   Tobacco Use   Smoking status: Every Day    Packs/day: 0.50    Years: 35.00    Additional pack years: 0.00    Total pack years: 17.50    Types: Cigarettes   Smokeless tobacco: Never  Substance Use Topics   Alcohol use: Yes    Alcohol/week: 6.0 standard drinks of alcohol    Types: 6 Cans of beer per week    FAMILY HISTORY:   Family History  Problem Relation Age of Onset   Alzheimer's disease Father    Angina Father    Diabetes Cousin        mothers side   Heart attack Cousin        fathers side   Hyperlipidemia Cousin        fathers side   Hypertension Cousin        fathers side    DRUG ALLERGIES:   Allergies  Allergen Reactions   Levofloxacin Itching and Rash   Doxycycline Nausea And Vomiting    REVIEW  OF SYSTEMS:   ROS As per history of present illness. All pertinent systems were reviewed above. Constitutional, HEENT, cardiovascular, respiratory, GI, GU, musculoskeletal, neuro, psychiatric, endocrine, integumentary and hematologic systems were reviewed and are otherwise negative/unremarkable except for positive findings mentioned above in the HPI.   MEDICATIONS AT HOME:   Prior to Admission medications   Medication Sig Start Date End Date Taking? Authorizing Provider  aspirin EC 81 MG tablet Take 1 tablet (81 mg total) by mouth daily. 02/24/18   Lyda Jester M, PA-C  cyanocobalamin 500 MCG tablet Take 500 mcg by mouth 2 (two) times daily. VITAMIN B-12 08/18/14   [provider]  donepezil (ARICEPT) 10 MG tablet TAKE ONE-HALF TABLET BY MOUTH EVERY DAY FOR DEMENTIA 08/31/21   [provider]  escitalopram (LEXAPRO) 20 MG tablet Take 1 tablet (20 mg total) by mouth daily. 04/12/14   Burnell Blanks, MD  esomeprazole (NEXIUM) 40 MG capsule Take 1 capsule by mouth daily.    [provider]  folic acid (FOLVITE) 1 MG tablet Take 1 mg by mouth daily. 10/01/21   [provider]  gabapentin (NEURONTIN) 400 MG capsule Take 400 mg by mouth at bedtime.     [provider]  montelukast (SINGULAIR) 10 MG tablet TAKE 1 TABLET AT BEDTIME 04/28/20   Kendell Bane, NP  morphine (MS CONTIN) 15 MG 12 hr tablet Take 15 mg by mouth 2 (two) times daily.     [provider]  oxyCODONE-acetaminophen (PERCOCET) 5-325 MG per tablet Take 1 tablet by mouth every 4 (four) hours as needed for moderate pain. For pain    [provider]  promethazine (PHENERGAN) 25 MG tablet Take 25 mg by mouth as needed.     [provider]  rosuvastatin (CRESTOR) 40 MG tablet TAKE 1 TABLET DAILY 11/19/22   Swinyer, Lanice Schwab, NP  tiotropium (SPIRIVA HANDIHALER) 18 MCG inhalation capsule INHALE THE CONTENTS OF 1 CAPSULE AT BEDTIME AS DIRECTED PER PACKAGE  INSTRUCTIONS 07/25/20   Kendell Bane, NP  Vitamin D, Ergocalciferol, (DRISDOL) 50000 UNITS CAPS capsule Take 50,000 Units by mouth every Wednesday.  12/15/14   [provider]      VITAL SIGNS:  Blood pressure 135/87, pulse 74, temperature 97.7 F (36.5 C), resp. rate 18, height 5\' 8"  (1.727 m), weight 68 kg, SpO2 99 %.  PHYSICAL EXAMINATION:  Physical Exam  GENERAL:  77 y.o.-year-old Caucasian male patient lying in the bed with no acute distress.  EYES: Pupils equal, round, reactive to light and accommodation. No scleral icterus. Extraocular muscles intact.  HEENT: Head atraumatic, normocephalic. Oropharynx and nasopharynx clear.  NECK:  Supple, no jugular venous distention. No thyroid enlargement, no tenderness.  LUNGS: Diminished bi-basal breath sounds with left basal crackles.  No use of accessory muscles of respiration.  CARDIOVASCULAR: Regular rate and rhythm, S1, S2 normal. No murmurs, rubs, or gallops.  ABDOMEN: Soft, nondistended, nontender. Bowel sounds present. No organomegaly or mass.  EXTREMITIES: No pedal edema, cyanosis, or clubbing.  NEUROLOGIC: Cranial nerves II through XII are intact. Muscle strength 5/5 in all extremities. Sensation intact. Gait not checked.  PSYCHIATRIC: The patient is alert and oriented x 3.  Normal affect and good eye contact. SKIN: No obvious rash, lesion, or ulcer.   LABORATORY PANEL:   CBC Recent Labs  Lab 01/02/23 1856  WBC 7.8  HGB 11.8*  HCT 35.3*  PLT 140*   ------------------------------------------------------------------------------------------------------------------  Chemistries  Recent Labs  Lab 01/02/23 1856  NA 136  K 3.2*  CL 102  CO2 26  GLUCOSE 125*  BUN 22  CREATININE 1.03  CALCIUM 9.3  AST 18  ALT 12  ALKPHOS 99  BILITOT 0.8   ------------------------------------------------------------------------------------------------------------------  Cardiac Enzymes No results for input(s):  "TROPONINI" in the last 168 hours. ------------------------------------------------------------------------------------------------------------------  RADIOLOGY:  CT CHEST WO CONTRAST  Result Date: 01/02/2023 CLINICAL DATA:  Fall vomiting EXAM: CT CHEST WITHOUT CONTRAST TECHNIQUE: Multidetector CT imaging of the chest was performed following the standard protocol without IV contrast. RADIATION DOSE REDUCTION: This exam was performed according to the departmental dose-optimization program which includes automated exposure control, adjustment of the mA and/or kV according to patient size and/or use of iterative reconstruction technique. COMPARISON:  Chest x-ray 04/17/2021, CT Mar 06, 2022, chest CT  09/30/2013, CT 11/06/2020 FINDINGS: Cardiovascular: Limited evaluation without intravenous contrast. Moderate aortic atherosclerosis. No aneurysm. Coronary vascular calcification. Normal cardiac size. Trace pericardial effusion. Mediastinum/Nodes: Midline trachea. No thyroid mass. Mediastinal adenopathy. AP window lymph node measuring 17 mm compared with 15 mm previously. Subcarinal lymph node measures 15 mm. Multiple additional mildly enlarged lymph nodes. Esophagus within normal limits. Lungs/Pleura: Emphysema. Focal subpleural consolidation in left lower lobe with possible central cavitation. Surrounding ground-glass density. Upper Abdomen: No acute finding Musculoskeletal: Sternum appears intact. Partially visualized hardware in the lower spine. Acute minimally displaced left fifth and sixth anterior rib fractures. IMPRESSION: 1. Acute minimally displaced left fifth and sixth anterior rib fractures. No visible pneumothorax. 2. Emphysema. Focal subpleural consolidation in the left lower lobe with possible central cavitation, question cavitary pneumonia versus cavitary mass. Surrounding ground-glass density is probably inflammatory/infectious. Recommend short interval follow-up CT to ensure resolution. 3.  Mediastinal adenopathy, slightly increased since most recent CT, possibly reactive. This may also be assessed on CT follow-up. 4. Aortic atherosclerosis. Aortic Atherosclerosis (ICD10-I70.0) and Emphysema (ICD10-J43.9). Electronically Signed   By: Donavan Foil M.D.   On: 01/02/2023 19:47   CT HEAD WO CONTRAST (5MM)  Result Date: 01/02/2023 CLINICAL DATA:  Head trauma EXAM: CT HEAD WITHOUT CONTRAST TECHNIQUE: Contiguous axial images were obtained from the base of the skull through the vertex without intravenous contrast. RADIATION DOSE REDUCTION: This exam was performed according to the departmental dose-optimization program which includes automated exposure control, adjustment of the mA and/or kV according to patient size and/or use of iterative reconstruction technique. COMPARISON:  MRI 03/22/2015 FINDINGS: Brain: No acute territorial infarction, hemorrhage, or intracranial mass. Minimal white matter hypodensity. The ventricles are non enlarged. Vascular: No hyperdense vessels.  Carotid vascular calcification Skull: Normal. Negative for fracture or focal lesion. Sinuses/Orbits: Mucosal thickening in the sinuses Other: None IMPRESSION: No CT evidence for acute intracranial abnormality. Minimal white matter disease. Electronically Signed   By: Donavan Foil M.D.   On: 01/02/2023 19:36      IMPRESSION AND PLAN:  Assessment and Plan: * Aspiration pneumonia ([place]) - The patient has left lower lobe community-acquired pneumonia likely aspiration specially given his recent vomiting. - - The patient will be admitted to a medical telemetry bed. - Will continue antibiotic therapy with IV Unasyn and Zithromax. - Mucolytic therapy be provided as well as duo nebs q.i.d. and q.4 hours p.r.n. - We will follow blood cultures.   Multiple fractures of ribs, left side, initial encounter for closed fracture - The patient has acute minimally displaced left fifth and sixth anterior rib fractures. No visible  pneumothorax. - Pain management will be provided. - This could have contributed to his pneumonia.  Mediastinal adenopathy - This is likely reactive but has increased from previous CT. - It will need follow-up as well on outpatient basis.  Essential hypertension - Will continue his antihypertensives.  Chronic obstructive pulmonary disease (COPD) ([place]) - Will be placed on DuoNebs as mentioned above.  GERD without esophagitis - Will continue PPI therapy.  Depression - Will continue his Lexapro.  Dementia without behavioral disturbance ([place]) - Will continue his Aricept.    DVT prophylaxis: Lovenox.  Advanced Care Planning:  Code Status: full code.  Family Communication:  The plan of care was discussed in details with the patient (and family). I answered all questions. The patient agreed to proceed with the above mentioned plan. Further management will depend upon hospital course. Disposition Plan: Back to previous home environment Consults called: none.  All  the records are reviewed and case discussed with ED provider.  Status is: Inpatient    At the time of the admission, it appears that the appropriate admission status for this patient is inpatient.  This is judged to be reasonable and necessary in order to provide the required intensity of service to ensure the patient's safety given the presenting symptoms, physical exam findings and initial radiographic and laboratory data in the context of comorbid conditions.  The patient requires inpatient status due to high intensity of service, high risk of further deterioration and high frequency of surveillance required.  I certify that at the time of admission, it is my clinical judgment that the patient will require inpatient hospital care extending more than 2 midnights.                            Dispo: The patient is from: Home              Anticipated d/c is to: Home              Patient currently is not medically stable to  d/c.              Difficult to place patient: No  Christel Mormon M.D on 01/03/2023 at 1:06 AM  [facility]   From 7 PM-7 AM, contact night-coverage [email or web address]  CC: Primary care physician; Tracie Harrier, MD

## 2023-01-02 NOTE — Code Documentation (Signed)
CODE SEPSIS - PHARMACY COMMUNICATION  **Broad Spectrum Antibiotics should be administered within 1 hour of Sepsis diagnosis**  Time Code Sepsis Called/Page Received: 2115  Antibiotics Ordered: cefepime, vancomycin  Time of 1st antibiotic administration: 2220  Additional action taken by pharmacy: reached out to RN to see if any issues with giving cefepime.  If necessary, Name of Provider/Nurse Contacted: [place] ,PharmD Clinical Pharmacist  01/02/2023  9:16 PM

## 2023-01-02 NOTE — Assessment & Plan Note (Signed)
-   Will continue his Lexapro.

## 2023-01-02 NOTE — Assessment & Plan Note (Signed)
-   Will continue his antihypertensives.

## 2023-01-02 NOTE — ED Provider Notes (Signed)
[facility] Provider Note    Event Date/Time   First MD Initiated Contact with Patient 01/02/23 1844     (approximate)  History   Chief Complaint: Fall  HPI  [name] is a 77 y.o. male with a past medical anemia, CAD, COPD, dementia, gastric reflux, hypertension, hyperlipidemia, presents to the emergency department with worsening confusion and trouble breathing.  According to EMS via family patient had a fall 4 days ago from a decent height.  After the fall patient was noted to have vomited and has had increased confusion ever since per family although they state a baseline history of dementia with mild confusion normally.  Patient found to have worsening trouble breathing and EMS was called patient was 87% on room air with no baseline O2 requirement.  Patient was treated with Solu-Medrol magnesium and DuoNebs by EMS and brought to the emergency department on 2 L nasal cannula.  Currently satting 96% on 2 L with audible rhonchi.  Physical Exam   Triage Vital Signs: ED Triage Vitals  Enc Vitals Group     BP 01/02/23 1844 (!) 146/56     Pulse Rate 01/02/23 1844 90     Resp 01/02/23 1844 (!) 22     Temp 01/02/23 1844 98.2 F (36.8 C)     Temp src --      SpO2 01/02/23 1833 96 %     Weight 01/02/23 1845 150 lb (68 kg)     Height 01/02/23 1845 5\' 8"  (1.727 m)     Head Circumference --      Peak Flow --      Pain Score --      Pain Loc --      Pain Edu? --      Excl. in [place]? --     Most recent vital signs: Vitals:   01/02/23 1843 01/02/23 1844  BP:  (!) 146/56  Pulse:  90  Resp:  (!) 22  Temp:  98.2 F (36.8 C)  SpO2: 96%     General: Awake, no distress.  CV:  Good peripheral perfusion.  Regular rate and rhythm  Resp:  Normal effort but rhonchi auscultated bilaterally.  Moderate chest wall tenderness to palpation of the left lateral chest. Abd:  No distention.  Soft, nontender.  No rebound or guarding.  Benign abdomen. Other:  Good range of  motion in all extremities.  No obvious head injury or hematoma.   ED Results / Procedures / Treatments   EKG  EKG viewed and interpreted by myself shows a sinus rhythm at 92 bpm with a narrow QRS, normal axis, slight QTc prolongation otherwise normal intervals, no ST elevation but nonspecific ST changes.  RADIOLOGY  I have reviewed and interpreted the chest CT images.  Patient appears to have a left posterior pneumonia. Radiology has read the CT scan as minimally displaced left fifth and sixth ribs with consolidation left lower lobe possible central cavitation.   MEDICATIONS ORDERED IN ED: Medications  sodium chloride 0.9 % bolus 1,000 mL (has no administration in time range)  ondansetron (ZOFRAN) injection 4 mg (has no administration in time range)     IMPRESSION / MDM / ASSESSMENT AND PLAN / ED COURSE  I reviewed the triage vital signs and the nursing notes.  Patient's presentation is most consistent with acute presentation with potential threat to life or bodily function.  Patient presents emergency department for worsening confusion worsening trouble breathing after a fall 4 days  ago.  There is report of some vomiting after the fall.  Patient has audible rhonchi on exam.  Differential would include pneumonia, pneumothorax/hemothorax, aspiration pneumonia.  We will check labs including a COVID swab, will obtain CT imaging of the head given the recent fall and worsening confusion as well as a CT scan of the chest to further evaluate.  We will IV hydrate treat with Zofran and continue to closely monitor.  Currently patient is awake alert answering questions appropriately following commands appropriately, no distress but does have audible rhonchi.  CT scan shows pneumonia possible cavitary pneumonia.  Patient CBC is reassuring with a normal white blood cell count, chemistry overall reassuring and troponin is negative.  COVID/flu/RSV is negative.  Patient also has several rib fractures.   This raises the concern given the report of vomiting that the patient may have aspirated.  Will cover broad-spectrum antibiotics.  Given the patient's hypoxia he will need admission to the hospital for further workup and treatment.  Patient agreeable to plan of care.  CRITICAL CARE Performed by: Harvest Dark   Total critical care time: 30 minutes  Critical care time was exclusive of separately billable procedures and treating other patients.  Critical care was necessary to treat or prevent imminent or life-threatening deterioration.  Critical care was time spent personally by me on the following activities: development of treatment plan with patient and/or surrogate as well as nursing, discussions with consultants, evaluation of patient's response to treatment, examination of patient, obtaining history from patient or surrogate, ordering and performing treatments and interventions, ordering and review of laboratory studies, ordering and review of radiographic studies, pulse oximetry and re-evaluation of patient's condition.   FINAL CLINICAL IMPRESSION(S) / ED DIAGNOSES   Dyspnea Confusion Pneumonia Acute hypoxic respiratory failure  Note:  This document was prepared using Dragon voice recognition software and may include unintentional dictation errors.   Harvest Dark, MD 01/02/23 2123

## 2023-01-02 NOTE — ED Triage Notes (Signed)
Patient presents to the ER via EMS with c/o a fall, vomiting and more confused. Per family patient has dementia and is usually confused but has been more altered. Patient had a fall off bobcat 4 days ago with left sided rib pain, unknown LOC. Patient denies blood thinners. Patient was found to be 87% RA. Patient is not on home O2. Patient was given 125mg  solu-medrol, 2g Mag, albuterol and duoneb and 2L nasal cannula per Med[facility].

## 2023-01-02 NOTE — Assessment & Plan Note (Signed)
-   Will be placed on DuoNebs as mentioned above.

## 2023-01-02 NOTE — Assessment & Plan Note (Addendum)
-   The patient has left lower lobe community-acquired pneumonia likely aspiration specially given his recent vomiting. - - The patient will be admitted to a medical telemetry bed. - Will continue antibiotic therapy with IV Unasyn and Zithromax. - Mucolytic therapy be provided as well as duo nebs q.i.d. and q.4 hours p.r.n. - We will follow blood cultures.

## 2023-01-03 ENCOUNTER — Encounter: Payer: Self-pay | Admitting: Family Medicine

## 2023-01-03 DIAGNOSIS — R590 Localized enlarged lymph nodes: Secondary | ICD-10-CM

## 2023-01-03 DIAGNOSIS — J189 Pneumonia, unspecified organism: Secondary | ICD-10-CM | POA: Diagnosis not present

## 2023-01-03 DIAGNOSIS — J440 Chronic obstructive pulmonary disease with acute lower respiratory infection: Secondary | ICD-10-CM | POA: Diagnosis not present

## 2023-01-03 DIAGNOSIS — S2242XA Multiple fractures of ribs, left side, initial encounter for closed fracture: Secondary | ICD-10-CM

## 2023-01-03 DIAGNOSIS — F32A Depression, unspecified: Secondary | ICD-10-CM

## 2023-01-03 LAB — CBC
HCT: 30.5 % — ABNORMAL LOW (ref 39.0–52.0)
Hemoglobin: 10.2 g/dL — ABNORMAL LOW (ref 13.0–17.0)
MCH: 33.2 pg (ref 26.0–34.0)
MCHC: 33.4 g/dL (ref 30.0–36.0)
MCV: 99.3 fL (ref 80.0–100.0)
Platelets: 118 10*3/uL — ABNORMAL LOW (ref 150–400)
RBC: 3.07 MIL/uL — ABNORMAL LOW (ref 4.22–5.81)
RDW: 13.9 % (ref 11.5–15.5)
WBC: 10.1 10*3/uL (ref 4.0–10.5)
nRBC: 0 % (ref 0.0–0.2)

## 2023-01-03 LAB — BASIC METABOLIC PANEL
Anion gap: 11 (ref 5–15)
BUN: 23 mg/dL (ref 8–23)
CO2: 24 mmol/L (ref 22–32)
Calcium: 9.1 mg/dL (ref 8.9–10.3)
Chloride: 106 mmol/L (ref 98–111)
Creatinine, Ser: 0.91 mg/dL (ref 0.61–1.24)
GFR, Estimated: >60 mL/min (ref >60)
Glucose, Bld: 130 mg/dL — ABNORMAL HIGH (ref 70–99)
Potassium: 3.7 mmol/L (ref 3.5–5.1)
Sodium: 141 mmol/L (ref 135–145)

## 2023-01-03 LAB — LACTIC ACID, PLASMA: Lactic Acid, Venous: 1.5 mmol/L (ref 0.5–1.9)

## 2023-01-03 MED ORDER — ROSUVASTATIN CALCIUM 10 MG PO TABS
40.0000 mg | ORAL_TABLET | Freq: Every day | ORAL | Status: DC
  Administered 2023-01-03 – 2023-01-04 (×2): 40 mg via ORAL
  Filled 2023-01-03 (×2): qty 4

## 2023-01-03 MED ORDER — FOLIC ACID 1 MG PO TABS
1.0000 mg | ORAL_TABLET | Freq: Every day | ORAL | Status: DC
  Administered 2023-01-03 – 2023-01-04 (×2): 1 mg via ORAL
  Filled 2023-01-03 (×2): qty 1

## 2023-01-03 MED ORDER — ASPIRIN 81 MG PO TBEC
81.0000 mg | DELAYED_RELEASE_TABLET | Freq: Every day | ORAL | Status: DC
  Administered 2023-01-03 – 2023-01-04 (×2): 81 mg via ORAL
  Filled 2023-01-03 (×2): qty 1

## 2023-01-03 MED ORDER — PANTOPRAZOLE SODIUM 40 MG PO TBEC
40.0000 mg | DELAYED_RELEASE_TABLET | Freq: Every day | ORAL | Status: DC
  Administered 2023-01-03 – 2023-01-04 (×2): 40 mg via ORAL
  Filled 2023-01-03 (×2): qty 1

## 2023-01-03 MED ORDER — ESCITALOPRAM OXALATE 10 MG PO TABS
20.0000 mg | ORAL_TABLET | Freq: Every day | ORAL | Status: DC
  Administered 2023-01-03 – 2023-01-04 (×2): 20 mg via ORAL
  Filled 2023-01-03 (×2): qty 2

## 2023-01-03 MED ORDER — IBUPROFEN 400 MG PO TABS: 800.0000 mg | ORAL_TABLET | Freq: Three times a day (TID) | ORAL | Status: DC | PRN

## 2023-01-03 MED ORDER — MONTELUKAST SODIUM 10 MG PO TABS
10.0000 mg | ORAL_TABLET | Freq: Every day | ORAL | Status: DC
  Administered 2023-01-03: 10 mg via ORAL
  Filled 2023-01-03: qty 1

## 2023-01-03 MED ORDER — OXYCODONE-ACETAMINOPHEN 5-325 MG PO TABS
1.0000 | ORAL_TABLET | ORAL | Status: DC | PRN
  Administered 2023-01-03 – 2023-01-04 (×3): 1 via ORAL
  Filled 2023-01-03 (×3): qty 1

## 2023-01-03 MED ORDER — DONEPEZIL HCL 5 MG PO TABS
5.0000 mg | ORAL_TABLET | Freq: Every day | ORAL | Status: DC
  Administered 2023-01-03: 5 mg via ORAL
  Filled 2023-01-03: qty 1

## 2023-01-03 MED ORDER — IPRATROPIUM-ALBUTEROL 0.5-2.5 (3) MG/3ML IN SOLN: 3.0000 mL | Freq: Four times a day (QID) | RESPIRATORY_TRACT | Status: DC | PRN

## 2023-01-03 MED ORDER — MORPHINE SULFATE ER 15 MG PO TBCR
15.0000 mg | EXTENDED_RELEASE_TABLET | Freq: Two times a day (BID) | ORAL | Status: DC
  Administered 2023-01-03 – 2023-01-04 (×3): 15 mg via ORAL
  Filled 2023-01-03 (×3): qty 1

## 2023-01-03 MED ORDER — GABAPENTIN 400 MG PO CAPS
400.0000 mg | ORAL_CAPSULE | Freq: Every day | ORAL | Status: DC
  Administered 2023-01-03: 400 mg via ORAL
  Filled 2023-01-03: qty 1

## 2023-01-03 MED ORDER — PROMETHAZINE HCL 25 MG PO TABS: 25.0000 mg | ORAL_TABLET | Freq: Four times a day (QID) | ORAL | Status: DC | PRN

## 2023-01-03 MED ORDER — VITAMIN B-12 1000 MCG PO TABS
500.0000 ug | ORAL_TABLET | Freq: Two times a day (BID) | ORAL | Status: DC
  Administered 2023-01-03 – 2023-01-04 (×3): 500 ug via ORAL
  Filled 2023-01-03 (×3): qty 1

## 2023-01-03 NOTE — Progress Notes (Signed)
PROGRESS NOTE    [name]   [id number] DOB: Mar 11, 1946  DOA: 01/02/2023 Date of Service: 01/03/23 PCP: Tracie Harrier, MD     Brief Narrative / Hospital Course:  Lehman R [name] is a 77 y.o. Caucasian male with medical history significant for osteoarthritis, coronary artery disease status post PCI and statin to RCA, COPD, depression, diverticulosis, GERD, GI bleeding, dyslipidemia and tobacco abuse, who presented to the emergency room with acute onset of fall.  The patient had an accidental fall from his Bobcat while he was jumping down from it 4 days ago and landed on his left chest.  He admitted to having vomiting after his fall.  He has been having mild altered mental status per his daughter over the last couple of days.  He lives by himself and his daughter was checking on him today when she found him on the bathroom floor.  It is unclear if he had a syncope or not.  He denied any paresthesias or focal muscle weakness.  He has been having dyspnea with associated cough and wheezing.   03/21: in ED, CT as below concerning for rib fx, LLL PNA question cavitary pneumonia vs mass. Admitted for concern for aspiration pneumonia / CAP.  03/22: now on room air. [facility] and known "scar" in L lung. Await BCx.   Consultants:  none  Procedures: none   CT chest 01/02/23 IMPRESSION: 1. Acute minimally displaced left fifth and sixth anterior rib fractures. No visible pneumothorax. 2. Emphysema. Focal subpleural consolidation in the left lower lobe with possible central cavitation, question cavitary pneumonia versus cavitary mass. Surrounding ground-glass density is probably inflammatory/infectious. Recommend short interval follow-up CT to ensure resolution. 3. Mediastinal adenopathy, slightly increased since most recent CT, possibly reactive. This may also be assessed on CT follow-up. 4. Aortic atherosclerosis.   ASSESSMENT & PLAN:    Principal Problem:   Aspiration pneumonia ([place]) Active Problems:   Multiple fractures of ribs, left side, initial encounter for closed fracture   Essential hypertension   Mediastinal adenopathy   Chronic obstructive pulmonary disease (COPD) (HCC)   Dementia without behavioral disturbance (HCC)   Depression   GERD without esophagitis   Aspiration pneumonia (HCC) Left lower lobe community-acquired pneumonia concern for aspiration Chronic obstructive pulmonary disease (COPD) Acute hypoxic respiratory failure - resolved and now on room air  IV Unasyn and Zithromax. Mucolytic therapy  duo nebs q.i.d. and q.4 hours p.r.n. follow blood cultures.  Essential hypertension continue antihypertensives.  Dementia without behavioral disturbance (HCC) continue Aricept.  Depression continue Lexapro.  GERD without esophagitis continue PPI .  Multiple fractures of ribs, left side, initial encounter for closed fracture minimally displaced left fifth and sixth anterior rib fractures which could have contributed to his pneumonia..  No visible pneumothorax. Pain management    Mediastinal adenopathy likely reactive but has increased from previous CT. will need follow-up as well on outpatient basis.    DVT prophylaxis: lovenox  Pertinent IV fluids/nutrition: have d/c continuous IV fluids  Central lines / invasive devices: none  Code Status: FULL CODE   Current Admission Status: inpatient   TOC needs / Dispo plan: no TOC needs at this time, pend PT/OT eval  Barriers to discharge / significant pending items: await BCx but if negative can hopefully d/c tomorrow              Subjective / Brief ROS:  Patient reports feeling a bit better today Denies  CP. Mild SOB.  Pain controlled.  Denies new weakness.  Tolerating diet.  Reports no concerns w/ urination/defecation.   Family Communication: none at this time     Objective Findings:  Vitals:   01/03/23 0030 01/03/23  0106 01/03/23 0732 01/03/23 0925  BP: (!) 117/50 135/87  (!) 140/69  Pulse: 62 74  79  Resp: 17 18  16   Temp:  97.7 F (36.5 C)  98.7 F (37.1 C)  TempSrc:      SpO2: 94% 99% 97% 98%  Weight:      Height:        Intake/Output Summary (Last 24 hours) at 01/03/2023 1212 Last data filed at 01/03/2023 0925 Gross per 24 hour  Intake 448.13 ml  Output 203 ml  Net 245.13 ml   Filed Weights   01/02/23 1845  Weight: 68 kg    Examination:  Physical Exam       Scheduled Medications:   enoxaparin (LOVENOX) injection  40 mg Subcutaneous Q24H   guaiFENesin  600 mg Oral BID    Continuous Infusions:  ampicillin-sulbactam (UNASYN) IV 3 g (01/03/23 0545)   azithromycin 500 mg (01/03/23 0901)    PRN Medications:  acetaminophen **OR** acetaminophen, chlorpheniramine-HYDROcodone, ipratropium-albuterol, magnesium hydroxide, ondansetron **OR** ondansetron (ZOFRAN) IV, traZODone  Antimicrobials from admission:  Anti-infectives (From admission, onward)    Start     Dose/Rate Route Frequency Ordered Stop   01/03/23 0800  azithromycin (ZITHROMAX) 500 mg in sodium chloride 0.9 % 250 mL IVPB        500 mg 250 mL/hr over 60 Minutes Intravenous Every 24 hours 01/02/23 2153 01/08/23 0759   01/03/23 0600  Ampicillin-Sulbactam (UNASYN) 3 g in sodium chloride 0.9 % 100 mL IVPB        3 g 200 mL/hr over 30 Minutes Intravenous Every 6 hours 01/02/23 2153 01/08/23 0559   01/02/23 2130  ceFEPIme (MAXIPIME) 2 g in sodium chloride 0.9 % 100 mL IVPB        2 g 200 mL/hr over 30 Minutes Intravenous  Once 01/02/23 2115 01/02/23 2303   01/02/23 2130  metroNIDAZOLE (FLAGYL) IVPB 500 mg        500 mg 100 mL/hr over 60 Minutes Intravenous  Once 01/02/23 2115 01/03/23 0044   01/02/23 2130  vancomycin (VANCOCIN) IVPB 1000 mg/200 mL premix  Status:  Discontinued        1,000 mg 200 mL/hr over 60 Minutes Intravenous  Once 01/02/23 2115 01/02/23 2116   01/02/23 2130  vancomycin (VANCOREADY) IVPB 1250  mg/250 mL        1,250 mg 166.7 mL/hr over 90 Minutes Intravenous  Once 01/02/23 2116 01/03/23 0419           Data Reviewed:  I have personally reviewed the following...  CBC: Recent Labs  Lab 01/02/23 1856 01/03/23 0614  WBC 7.8 10.1  HGB 11.8* 10.2*  HCT 35.3* 30.5*  MCV 99.7 99.3  PLT 140* [id number]*   Basic Metabolic Panel: Recent Labs  Lab 01/02/23 1856 01/03/23 0614  NA 136 141  K 3.2* 3.7  CL 102 106  CO2 26 24  GLUCOSE 125* 130*  BUN 22 23  CREATININE 1.03 0.91  CALCIUM 9.3 9.1   GFR: Estimated Creatinine Clearance: 64.3 mL/min (by C-G formula based on SCr of 0.91 mg/dL). Liver Function Tests: Recent Labs  Lab 01/02/23 1856  AST 18  ALT 12  ALKPHOS 99  BILITOT 0.8  PROT 7.2  ALBUMIN 3.5   No results  for input(s): "LIPASE", "AMYLASE" in the last 168 hours. No results for input(s): "AMMONIA" in the last 168 hours. Coagulation Profile: No results for input(s): "INR", "PROTIME" in the last 168 hours. Cardiac Enzymes: No results for input(s): "CKTOTAL", "CKMB", "CKMBINDEX", "TROPONINI" in the last 168 hours. BNP (last 3 results) No results for input(s): "PROBNP" in the last 8760 hours. HbA1C: No results for input(s): "HGBA1C" in the last 72 hours. CBG: No results for input(s): "GLUCAP" in the last 168 hours. Lipid Profile: No results for input(s): "CHOL", "HDL", "LDLCALC", "TRIG", "CHOLHDL", "LDLDIRECT" in the last 72 hours. Thyroid Function Tests: No results for input(s): "TSH", "T4TOTAL", "FREET4", "T3FREE", "THYROIDAB" in the last 72 hours. Anemia Panel: No results for input(s): "VITAMINB12", "FOLATE", "FERRITIN", "TIBC", "IRON", "RETICCTPCT" in the last 72 hours. Most Recent Urinalysis On File:     Component Value Date/Time   COLORURINE YELLOW (A) 03/06/2022 1450   APPEARANCEUR CLEAR (A) 03/06/2022 1450   LABSPEC >1.046 (H) 03/06/2022 1450   PHURINE 6.0 03/06/2022 1450   GLUCOSEU NEGATIVE 03/06/2022 1450   HGBUR NEGATIVE 03/06/2022 1450    BILIRUBINUR NEGATIVE 03/06/2022 1450   KETONESUR 20 (A) 03/06/2022 1450   PROTEINUR 30 (A) 03/06/2022 1450   NITRITE NEGATIVE 03/06/2022 1450   LEUKOCYTESUR NEGATIVE 03/06/2022 1450   Sepsis Labs: @LABRCNTIP (procalcitonin:4,lacticidven:4) Microbiology: Recent Results (from the past 240 hour(s))  Resp panel by RT-PCR (RSV, Flu A&B, Covid) Anterior Nasal Swab     Status: None   Collection Time: 01/02/23  7:27 PM   Specimen: Anterior Nasal Swab  Result Value Ref Range Status   SARS Coronavirus 2 by RT PCR NEGATIVE NEGATIVE Final    Comment: (NOTE) SARS-CoV-2 target nucleic acids are NOT DETECTED.  The SARS-CoV-2 RNA is generally detectable in upper respiratory specimens during the acute phase of infection. The lowest concentration of SARS-CoV-2 viral copies this assay can detect is 138 copies/mL. A negative result does not preclude SARS-Cov-2 infection and should not be used as the sole basis for treatment or other patient management decisions. A negative result may occur with  improper specimen collection/handling, submission of specimen other than nasopharyngeal swab, presence of viral mutation(s) within the areas targeted by this assay, and inadequate number of viral copies(<138 copies/mL). A negative result must be combined with clinical observations, patient history, and epidemiological information. The expected result is Negative.  Fact Sheet for Patients:  EntrepreneurPulse.com.au  Fact Sheet for Healthcare Providers:  IncredibleEmployment.be  This test is no t yet approved or cleared by the [place] FDA and  has been authorized for detection and/or diagnosis of SARS-CoV-2 by FDA under an Emergency Use Authorization (EUA). This EUA will remain  in effect (meaning this test can be used) for the duration of the COVID-19 declaration under Section 564(b)(1) of the Act, 21 U.S.C.section 360bbb-3(b)(1), unless the authorization is  terminated  or revoked sooner.       Influenza A by PCR NEGATIVE NEGATIVE Final   Influenza B by PCR NEGATIVE NEGATIVE Final    Comment: (NOTE) The Xpert Xpress SARS-CoV-2/FLU/RSV plus assay is intended as an aid in the diagnosis of influenza from Nasopharyngeal swab specimens and should not be used as a sole basis for treatment. Nasal washings and aspirates are unacceptable for Xpert Xpress SARS-CoV-2/FLU/RSV testing.  Fact Sheet for Patients: EntrepreneurPulse.com.au  Fact Sheet for Healthcare Providers: IncredibleEmployment.be  This test is not yet approved or cleared by the [place] FDA and has been authorized for detection and/or diagnosis of SARS-CoV-2 by FDA under an Emergency  Use Authorization (EUA). This EUA will remain in effect (meaning this test can be used) for the duration of the COVID-19 declaration under Section 564(b)(1) of the Act, 21 U.S.C. section 360bbb-3(b)(1), unless the authorization is terminated or revoked.     Resp Syncytial Virus by PCR NEGATIVE NEGATIVE Final    Comment: (NOTE) Fact Sheet for Patients: EntrepreneurPulse.com.au  Fact Sheet for Healthcare Providers: IncredibleEmployment.be  This test is not yet approved or cleared by the [place] FDA and has been authorized for detection and/or diagnosis of SARS-CoV-2 by FDA under an Emergency Use Authorization (EUA). This EUA will remain in effect (meaning this test can be used) for the duration of the COVID-19 declaration under Section 564(b)(1) of the Act, 21 U.S.C. section 360bbb-3(b)(1), unless the authorization is terminated or revoked.  Performed at [facility], [address] [place], [place] [address]   Blood Culture (routine x 2)     Status: None (Preliminary result)   Collection Time: 01/02/23  9:50 PM   Specimen: BLOOD  Result Value Ref Range Status   Specimen Description BLOOD LEFT ARM   Final   Special Requests   Final    BOTTLES DRAWN AEROBIC AND ANAEROBIC Blood Culture adequate volume   Culture   Final    NO GROWTH < 12 HOURS Performed at [facility], [address], [place], [place] [address]    Report Status PENDING  Incomplete  Blood Culture (routine x 2)     Status: None (Preliminary result)   Collection Time: 01/02/23  9:50 PM   Specimen: BLOOD  Result Value Ref Range Status   Specimen Description BLOOD RIGHT ARM  Final   Special Requests   Final    BOTTLES DRAWN AEROBIC AND ANAEROBIC Blood Culture adequate volume   Culture   Final    NO GROWTH < 12 HOURS Performed at [facility], [address], [place],  [address]    Report Status PENDING  Incomplete      Radiology Studies last 3 days: CT CHEST WO CONTRAST  Result Date: 01/02/2023 CLINICAL DATA:  Fall vomiting EXAM: CT CHEST WITHOUT CONTRAST TECHNIQUE: Multidetector CT imaging of the chest was performed following the standard protocol without IV contrast. RADIATION DOSE REDUCTION: This exam was performed according to the departmental dose-optimization program which includes automated exposure control, adjustment of the mA and/or kV according to patient size and/or use of iterative reconstruction technique. COMPARISON:  Chest x-ray 04/17/2021, CT Mar 06, 2022, chest CT 09/30/2013, CT 11/06/2020 FINDINGS: Cardiovascular: Limited evaluation without intravenous contrast. Moderate aortic atherosclerosis. No aneurysm. Coronary vascular calcification. Normal cardiac size. Trace pericardial effusion. Mediastinum/Nodes: Midline trachea. No thyroid mass. Mediastinal adenopathy. AP window lymph node measuring 17 mm compared with 15 mm previously. Subcarinal lymph node measures 15 mm. Multiple additional mildly enlarged lymph nodes. Esophagus within normal limits. Lungs/Pleura: Emphysema. Focal subpleural consolidation in left lower lobe with possible central cavitation. Surrounding ground-glass  density. Upper Abdomen: No acute finding Musculoskeletal: Sternum appears intact. Partially visualized hardware in the lower spine. Acute minimally displaced left fifth and sixth anterior rib fractures. IMPRESSION: 1. Acute minimally displaced left fifth and sixth anterior rib fractures. No visible pneumothorax. 2. Emphysema. Focal subpleural consolidation in the left lower lobe with possible central cavitation, question cavitary pneumonia versus cavitary mass. Surrounding ground-glass density is probably inflammatory/infectious. Recommend short interval follow-up CT to ensure resolution. 3. Mediastinal adenopathy, slightly increased since most recent CT, possibly reactive. This may also be assessed on CT follow-up. 4.  Aortic atherosclerosis. Aortic Atherosclerosis (ICD10-I70.0) and Emphysema (ICD10-J43.9). Electronically Signed   By: Donavan Foil M.D.   On: 01/02/2023 19:47   CT HEAD WO CONTRAST (5MM)  Result Date: 01/02/2023 CLINICAL DATA:  Head trauma EXAM: CT HEAD WITHOUT CONTRAST TECHNIQUE: Contiguous axial images were obtained from the base of the skull through the vertex without intravenous contrast. RADIATION DOSE REDUCTION: This exam was performed according to the departmental dose-optimization program which includes automated exposure control, adjustment of the mA and/or kV according to patient size and/or use of iterative reconstruction technique. COMPARISON:  MRI 03/22/2015 FINDINGS: Brain: No acute territorial infarction, hemorrhage, or intracranial mass. Minimal white matter hypodensity. The ventricles are non enlarged. Vascular: No hyperdense vessels.  Carotid vascular calcification Skull: Normal. Negative for fracture or focal lesion. Sinuses/Orbits: Mucosal thickening in the sinuses Other: None IMPRESSION: No CT evidence for acute intracranial abnormality. Minimal white matter disease. Electronically Signed   By: Donavan Foil M.D.   On: 01/02/2023 19:36             LOS: 1 day       Emeterio Reeve, DO [facility] 01/03/2023, 12:12 PM    Dictation software may have been used to generate the above note. Typos may occur and escape review in typed/dictated notes. Please contact Dr Sheppard Coil directly for clarity if needed.  Staff may message me via secure chat in [place]  but this may not receive an immediate response,  please page me for urgent matters!  If 7PM-7AM, please contact night coverage [email or web address]

## 2023-01-03 NOTE — Evaluation (Signed)
Physical Therapy Evaluation Patient Details Name: [name] MRN: [id number] DOB: [birth year] Today's Date: 01/03/2023  History of Present Illness  Pt is a 77 y.o. male presenting to hospital 01/02/23 with worsening confusion and trouble breathing; pt had fall 4 days prior from decent height; pt found on bathroom floor.  H/o dementia with mild confusion normally.  Imaging showing acute minimally displaced L 5th and 6th anterior rib fx's.  Pt admitted with aspiration PNA, multiple L sided rib fx's, mediastinal adenopathy.  PMH includes OA, CAD s/p PCI and statin to RCA, COPD, depression, GI bleeding, tobacco abuse, back sx, triceps tendon repair R, L foot sx.  Clinical Impression  Prior to recent fall, pt was independent with functional mobility; lives alone in 1 level home with steps to enter.  Pt reporting 4/10 L ribs and back pain during session.  A&O x4.  Currently pt is modified independent with bed mobility; CGA with transfers; and CGA to ambulate 200 feet no AD use (mild unsteadiness noted during ambulation so CGA provided for safety but no overt loss of balance noted).  Pt would currently benefit from skilled PT to address noted impairments and functional limitations (see below for any additional details).  Upon hospital discharge, pt would benefit from ongoing therapy.    Recommendations for follow up therapy are one component of a multi-disciplinary discharge planning process, led by the attending physician.  Recommendations may be updated based on patient status, additional functional criteria and insurance authorization.  Follow Up Recommendations Home health PT      Assistance Recommended at Discharge Intermittent Supervision/Assistance  Patient can return home with the following  A little help with walking and/or transfers;A little help with bathing/dressing/bathroom;Assistance with cooking/housework;Assist for transportation;Help with stairs or ramp for entrance    Equipment  Recommendations None recommended by PT  Recommendations for Other Services       Functional Status Assessment Patient has had a recent decline in their functional status and demonstrates the ability to make significant improvements in function in a reasonable and predictable amount of time.     Precautions / Restrictions Precautions Precautions: Fall Restrictions Weight Bearing Restrictions: No      Mobility  Bed Mobility Overal bed mobility: Modified Independent             General bed mobility comments: Supine to/from sitting with mild increased effort d/t L rib pain    Transfers Overall transfer level: Needs assistance Equipment used: None Transfers: Sit to/from Stand Sit to Stand: Min guard           General transfer comment: fairly strong stand from bed; steady    Ambulation/Gait Ambulation/Gait assistance: Min guard Gait Distance (Feet): 200 Feet Assistive device: None Gait Pattern/deviations: Step-through pattern Gait velocity: mildly decreased     General Gait Details: mildly unsteady (CGA provided for safety) but no overt loss of balance noted  Stairs            Wheelchair Mobility    Modified Rankin (Stroke Patients Only)       Balance Overall balance assessment: Needs assistance Sitting-balance support: No upper extremity supported, Feet supported Sitting balance-Leahy Scale: Normal Sitting balance - Comments: steady sitting reaching outside BOS   Standing balance support: No upper extremity supported, During functional activity Standing balance-Leahy Scale: Good Standing balance comment: unsteadiness noted but no overt loss of balance noted  Pertinent Vitals/Pain Pain Assessment Pain Assessment: 0-10 Pain Score: 4  Pain Location: L ribs/back Pain Descriptors / Indicators: Aching, Discomfort, Tender Pain Intervention(s): Limited activity within patient's tolerance, Monitored during  session, Repositioned Vitals (HR and O2 on room air) stable and WFL throughout treatment session.    Home Living Family/patient expects to be discharged to:: Private residence Living Arrangements: Alone   Type of Home: House Home Access: Stairs to enter Entrance Stairs-Rails: None Entrance Stairs-Number of Steps: 2-3   Home Layout: Laundry or work area in [place]: Conservation officer, nature (2 wheels)      Prior Function Prior Level of Function : Independent/Modified Independent             Mobility Comments: No falls since "Bobcat".       Hand Dominance        Extremity/Trunk Assessment   Upper Extremity Assessment Upper Extremity Assessment: Overall WFL for tasks assessed;Defer to OT evaluation    Lower Extremity Assessment Lower Extremity Assessment: Generalized weakness    Cervical / Trunk Assessment Cervical / Trunk Assessment: Normal  Communication   Communication: No difficulties  Cognition Arousal/Alertness: Awake/alert Behavior During Therapy: WFL for tasks assessed/performed Overall Cognitive Status: Within Functional Limits for tasks assessed                                 General Comments: A&O x4        General Comments      Exercises     Assessment/Plan    PT Assessment Patient needs continued PT services  PT Problem List Decreased strength;Decreased activity tolerance;Decreased balance;Decreased mobility;Pain       PT Treatment Interventions DME instruction;Gait training;Stair training;Functional mobility training;Therapeutic activities;Therapeutic exercise;Balance training;Patient/family education    PT Goals (Current goals can be found in the Care Plan section)  Acute Rehab PT Goals Patient Stated Goal: to eat PT Goal Formulation: With patient Time For Goal Achievement: 01/17/23 Potential to Achieve Goals: Good    Frequency Min 2X/week     Co-evaluation               AM-PAC PT "6 Clicks"  Mobility  Outcome Measure Help needed turning from your back to your side while in a flat bed without using bedrails?: None Help needed moving from lying on your back to sitting on the side of a flat bed without using bedrails?: None Help needed moving to and from a bed to a chair (including a wheelchair)?: A Little Help needed standing up from a chair using your arms (e.g., wheelchair or bedside chair)?: A Little Help needed to walk in hospital room?: A Little Help needed climbing 3-5 steps with a railing? : A Little 6 Click Score: 20    End of Session Equipment Utilized During Treatment: Gait belt Activity Tolerance: Patient tolerated treatment well Patient left: in bed;with call bell/phone within reach;with bed alarm set Nurse Communication: Mobility status;Precautions;Other (comment) (Discussed with pt's nurse and MD that pt NPO but does not have SLP orders (nursing reporting waiting for SLP eval)) PT Visit Diagnosis: Unsteadiness on feet (R26.81);Muscle weakness (generalized) (M62.81);History of falling (Z91.81);Pain Pain - Right/Left: Left Pain - part of body:  (ribs)    Time: [id number] PT Time Calculation (min) (ACUTE ONLY): 15 min   Charges:   PT Evaluation $PT Eval Low Complexity: 1 Low PT Treatments $Therapeutic Activity: 8-22 mins       Leitha Bleak, PT 01/03/23, 4:24  PM   

## 2023-01-03 NOTE — Assessment & Plan Note (Signed)
-   This is likely reactive but has increased from previous CT. - It will need follow-up as well on outpatient basis.

## 2023-01-03 NOTE — Evaluation (Signed)
Occupational Therapy Evaluation Patient Details Name: [name] MRN: [id number] DOB: 01-04-1946 Today's Date: 01/03/2023   History of Present Illness [name] is a 77 y.o. Caucasian male with medical history significant for osteoarthritis, coronary artery disease status post PCI and statin to RCA, COPD, depression, diverticulosis, GERD, GI bleeding, dyslipidemia and tobacco abuse, who presented to the emergency room with acute onset of fall. Chest CT 01/02/23: Acute minimally displaced left fifth and sixth anterior rib  fractures   Clinical Impression   [name] was seen for OT evaluation this date. Prior to hospital admission, pt was IND. Pt lives alone. Pt presents to acute OT demonstrating impaired ADL performance and functional mobility 2/2 decreased activity tolerance. Pt currently MOD I for bed mobility, educated on use of pillow for pain mgmt. SUPERVISION sit<>stand and side steps along EOB. MOD I for LB access seated EOB. Pt would benefit from skilled OT to address noted impairments and functional limitations (see below for any additional details). Upon hospital discharge, recommend HHOT to maximize pt safety and return to PLOF.    Recommendations for follow up therapy are one component of a multi-disciplinary discharge planning process, led by the attending physician.  Recommendations may be updated based on patient status, additional functional criteria and insurance authorization.   Follow Up Recommendations  Home health OT     Assistance Recommended at Discharge Set up Supervision/Assistance  Patient can return home with the following A little help with walking and/or transfers;A little help with bathing/dressing/bathroom    Functional Status Assessment  Patient has had a recent decline in their functional status and demonstrates the ability to make significant improvements in function in a reasonable and predictable amount of time.  Equipment Recommendations  None  recommended by OT    Recommendations for Other Services       Precautions / Restrictions Precautions Precautions: Fall Restrictions Weight Bearing Restrictions: No      Mobility Bed Mobility Overal bed mobility: Modified Independent                  Transfers Overall transfer level: Needs assistance Equipment used: None Transfers: Sit to/from Stand Sit to Stand: Supervision                  Balance Overall balance assessment: Needs assistance Sitting-balance support: No upper extremity supported, Feet supported Sitting balance-Leahy Scale: Normal     Standing balance support: No upper extremity supported Standing balance-Leahy Scale: Good                             ADL either performed or assessed with clinical judgement   ADL Overall ADL's : Needs assistance/impaired                                       General ADL Comments: SUPERVISION simulated SBC t/f. MOD I for LB access seated EOB.      Pertinent Vitals/Pain Pain Assessment Pain Assessment: Faces Faces Pain Scale: Hurts little more Pain Location: ribs/chest Pain Descriptors / Indicators: Aching, Discomfort Pain Intervention(s): Limited activity within patient's tolerance, Repositioned     Hand Dominance     Extremity/Trunk Assessment Upper Extremity Assessment Upper Extremity Assessment: Overall WFL for tasks assessed   Lower Extremity Assessment Lower Extremity Assessment: Generalized weakness       Communication Communication Communication: No difficulties  Cognition Arousal/Alertness: Awake/alert Behavior During Therapy: WFL for tasks assessed/performed Overall Cognitive Status: Within Functional Limits for tasks assessed                                        Home Living Family/patient expects to be discharged to:: Private residence Living Arrangements: Alone   Type of Home: House Home Access: Stairs to enter Engineer, site of Steps: 3-5 Entrance Stairs-Rails: None Home Layout: Laundry or work area in basement                          Prior Functioning/Environment Prior Level of Function : Independent/Modified Independent                        OT Problem List: Decreased range of motion;Decreased activity tolerance;Pain      OT Treatment/Interventions: Therapeutic exercise;Self-care/ADL training;Energy conservation;Therapeutic activities;Balance training;Patient/family education    OT Goals(Current goals can be found in the care plan section) Acute Rehab OT Goals Patient Stated Goal: to go home OT Goal Formulation: With patient Time For Goal Achievement: 01/17/23 Potential to Achieve Goals: Good ADL Goals Pt Will Transfer to Toilet: Independently;ambulating;regular height toilet Pt/caregiver will Perform Home Exercise Program: Increased strength;Both right and left upper extremity  OT Frequency: Min 1X/week    Co-evaluation              AM-PAC OT "6 Clicks" Daily Activity     Outcome Measure Help from another person eating meals?: None Help from another person taking care of personal grooming?: A Little Help from another person toileting, which includes using toliet, bedpan, or urinal?: A Little Help from another person bathing (including washing, rinsing, drying)?: A Little Help from another person to put on and taking off regular upper body clothing?: None Help from another person to put on and taking off regular lower body clothing?: None 6 Click Score: 21   End of Session    Activity Tolerance: Patient tolerated treatment well Patient left: in bed;with call bell/phone within reach;with bed alarm set  OT Visit Diagnosis: Other abnormalities of gait and mobility (R26.89);Muscle weakness (generalized) (M62.81)                Time: [id number] OT Time Calculation (min): 11 min Charges:  OT General Charges $OT Visit: 1 Visit OT Evaluation $OT Eval Low  Complexity: 1 Low  Dessie Coma, M.S. OTR/L  01/03/23, 1:23 PM  ascom [phone]

## 2023-01-03 NOTE — Assessment & Plan Note (Signed)
-   The patient has acute minimally displaced left fifth and sixth anterior rib fractures. No visible pneumothorax. - Pain management will be provided. - This could have contributed to his pneumonia.

## 2023-01-03 NOTE — Hospital Course (Addendum)
[name] is a 77 y.o. Caucasian male with medical history significant for osteoarthritis, coronary artery disease status post PCI and statin to RCA, COPD, depression, diverticulosis, GERD, GI bleeding, dyslipidemia and tobacco abuse, who presented to the emergency room with acute onset of fall.  The patient had an accidental fall from his [place]cat while he was jumping down from it 4 days ago and landed on his left chest.  He admitted to having vomiting after his fall.  He has been having mild altered mental status per his daughter over the last couple of days.  He lives by himself and his daughter was checking on him today when she found him on the bathroom floor.  It is unclear if he had a syncope or not.  He denied any paresthesias or focal muscle weakness.  He has been having dyspnea with associated cough and wheezing.   03/21: in ED, CT as below concerning for rib fx, LLL PNA question cavitary pneumonia vs mass. Admitted for concern for aspiration pneumonia / CAP.  03/22: now on room air. [facility] and known "scar" in L lung. Await BCx.   Consultants:  none  Procedures: none   CT chest 01/02/23 IMPRESSION: 1. Acute minimally displaced left fifth and sixth anterior rib fractures. No visible pneumothorax. 2. Emphysema. Focal subpleural consolidation in the left lower lobe with possible central cavitation, question cavitary pneumonia versus cavitary mass. Surrounding ground-glass density is probably inflammatory/infectious. Recommend short interval follow-up CT to ensure resolution. 3. Mediastinal adenopathy, slightly increased since most recent CT, possibly reactive. This may also be assessed on CT follow-up. 4. Aortic atherosclerosis.   ASSESSMENT & PLAN:   Principal Problem:   Aspiration pneumonia ([name]) Active Problems:   Multiple fractures of ribs, left side, initial encounter for closed fracture   Essential hypertension    Mediastinal adenopathy   Chronic obstructive pulmonary disease (COPD) (HCC)   Dementia without behavioral disturbance (HCC)   Depression   GERD without esophagitis   Aspiration pneumonia (HCC) Left lower lobe community-acquired pneumonia concern for aspiration Chronic obstructive pulmonary disease (COPD) Acute hypoxic respiratory failure - resolved and now on room air  IV Unasyn and Zithromax. Mucolytic therapy  duo nebs q.i.d. and q.4 hours p.r.n. follow blood cultures.  Essential hypertension continue antihypertensives.  Dementia without behavioral disturbance (HCC) continue Aricept.  Depression continue Lexapro.  GERD without esophagitis continue PPI .  Multiple fractures of ribs, left side, initial encounter for closed fracture minimally displaced left fifth and sixth anterior rib fractures which could have contributed to his pneumonia..  No visible pneumothorax. Pain management    Mediastinal adenopathy likely reactive but has increased from previous CT. will need follow-up as well on outpatient basis.    DVT prophylaxis: lovenox  Pertinent IV fluids/nutrition: have d/c continuous IV fluids  Central lines / invasive devices: none  Code Status: FULL CODE   Current Admission Status: inpatient   TOC needs / Dispo plan: no TOC needs at this time, pend PT/OT eval  Barriers to discharge / significant pending items: await BCx but if negative can hopefully d/c tomorrow

## 2023-01-04 DIAGNOSIS — S2242XA Multiple fractures of ribs, left side, initial encounter for closed fracture: Secondary | ICD-10-CM | POA: Diagnosis not present

## 2023-01-04 DIAGNOSIS — I10 Essential (primary) hypertension: Secondary | ICD-10-CM | POA: Diagnosis not present

## 2023-01-04 DIAGNOSIS — F32A Depression, unspecified: Secondary | ICD-10-CM | POA: Diagnosis not present

## 2023-01-04 DIAGNOSIS — J440 Chronic obstructive pulmonary disease with acute lower respiratory infection: Secondary | ICD-10-CM | POA: Diagnosis not present

## 2023-01-04 MED ORDER — AZITHROMYCIN 250 MG PO TABS: 250.0000 mg | ORAL_TABLET | Freq: Every day | ORAL | 0 refills | Status: DC

## 2023-01-04 MED ORDER — TIOTROPIUM BROMIDE MONOHYDRATE 18 MCG IN CAPS: ORAL_CAPSULE | RESPIRATORY_TRACT | 0 refills | Status: DC

## 2023-01-04 MED ORDER — HYDROCOD POLI-CHLORPHE POLI ER 10-8 MG/5ML PO SUER: 5.0000 mL | Freq: Two times a day (BID) | ORAL | 0 refills | Status: DC | PRN

## 2023-01-04 MED ORDER — AMOXICILLIN-POT CLAVULANATE 500-125 MG PO TABS: 1.0000 | ORAL_TABLET | Freq: Two times a day (BID) | ORAL | 0 refills | Status: AC

## 2023-01-04 MED ORDER — IPRATROPIUM-ALBUTEROL 0.5-2.5 (3) MG/3ML IN SOLN: 3.0000 mL | RESPIRATORY_TRACT | 0 refills | Status: DC | PRN

## 2023-01-04 MED ORDER — GUAIFENESIN ER 600 MG PO TB12: 600.0000 mg | ORAL_TABLET | Freq: Two times a day (BID) | ORAL | 0 refills | Status: DC

## 2023-01-04 NOTE — Progress Notes (Signed)
Mobility Specialist - Progress Note    01/04/23 1002  Mobility  Activity Ambulated with assistance in hallway;Stood at bedside;Dangled on edge of bed  Level of Assistance Standby assist, set-up cues, supervision of patient - no hands on  Assistive Device Front wheel walker  Distance Ambulated (ft) 160 ft  Range of Motion/Exercises Active  Activity Response Tolerated well  Mobility Referral Yes  $Mobility charge 1 Mobility   Pt resting in bed on RA upon entry. Pt expressed pain 5/10 in back. (RN Notified) Pt STS and ambulates to hallway around NS for 1 lap with no AD. Pt had a brief wobble at 10 ft but, self corrected. Pt returned to EOB and left with needs in reach.

## 2023-01-04 NOTE — TOC Initial Note (Signed)
Transition of Care [facility]) - Initial/Assessment Note    Patient Details  Name: [name] MRN: [id number] Date of Birth: Nov 27, 1945  Transition [facility] Care [facility]) CM/SW Contact:    Izola Price, RN Phone Number: 01/04/2023, 10:13 AM  Clinical Narrative: 01/04/23: Damaris Schooner with patient regarding discharge plan after introducing role of RN CM and discharge planning. Patient alert and engaged in conversation. PCP and RX confirmed. Patient lives alone in one level home and was Independent with functional mobility prior to this admission/recent falls. Came in with htx of falls, confusion, shortness of breath to the [facility] ED on 01/02/23. Lives outside of [place], [place]. Confirmed home address in chart. Daughter provides transportation when unable to drive, but had been driving PTA. Htx of some dementia. Feels safe going home with [facility] services. Had [facility] in 2012 but does not currently remember agency. Verbally agreed to RN CM reaching out to agencies in order to secure one before discharge. Discussed he could changed via PCP once he got home if he wished. No preference other than that. [facility] contacted and accepted. No DME orders, has a RW at home. Currently on IV Abx for aspiration PNA. Also has LEFT rib fx noted on admission. Discharge orders in for today.  Simmie Davies RN CM                         Patient Goals and CMS Choice            Expected Discharge Plan and Services                         DME Arranged: N/A (Has RW at home) DME Agency: NA       HH Arranged: PT, OT HH Agency: [place] ([facility]) Date HH Agency Contacted: 01/04/23 Time HH Agency Contacted: 1013    Prior Living Arrangements/Services                       Activities of Daily Living Home Assistive Devices/Equipment: None ADL Screening (condition at time of admission) Patient's cognitive ability adequate to safely complete daily activities?: Yes Is the patient deaf or have difficulty hearing?:  No Does the patient have difficulty seeing, even when wearing glasses/contacts?: No Does the patient have difficulty concentrating, remembering, or making decisions?: No Patient able to express need for assistance with ADLs?: Yes Does the patient have difficulty dressing or bathing?: No Independently performs ADLs?: Yes (appropriate for developmental age) Does the patient have difficulty walking or climbing stairs?: No Weakness of Legs: None Weakness of Arms/Hands: None  Permission Sought/Granted                  Emotional Assessment              Admission diagnosis:  Aspiration pneumonia ([place]) [J69.0] Acute respiratory failure with hypoxia ([place]) [J96.01] Closed fracture of multiple ribs of left side, initial encounter [S22.42XA] Community acquired pneumonia of left lower lobe of lung [J18.9] Patient Active Problem List   Diagnosis Date Noted   Multiple fractures of ribs, left side, initial encounter for closed fracture 01/03/2023   Mediastinal adenopathy 01/03/2023   Aspiration pneumonia ([place]) 01/02/2023   Dementia without behavioral disturbance ([place]) 01/02/2023   Depression 01/02/2023   GERD without esophagitis 01/02/2023   Chronic obstructive pulmonary disease (COPD) ([place]) 01/02/2023   Alcohol withdrawal delirium ([place]) 01/11/2020   Lobar pneumonia, unspecified organism ([place]) 01/11/2020  Acute respiratory failure with hypoxia (HCC) 01/11/2020   Acute pulmonary edema ([place]) [id number]   Toxic metabolic encephalopathy Q000111Q   Normocytic anemia 01/11/2020   Narcotic dependency, continuous ([place]) 01/11/2020   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)  02/13/2017   CAD in native artery 03/24/2011   Tobacco user 03/24/2011   Hypercholesterolemia 03/24/2011   PCP:  Tracie Harrier, MD Pharmacy:   [place], [place] [address] Phone: [phone] Fax: [place], [address] [place] [place] [address] Phone: [phone] Fax: [phone]     Social Determinants of Health (SDOH) Social History: [place]: No Food Insecurity (01/03/2023)  Housing: Low Risk  (01/03/2023)  Transportation Needs: No Transportation Needs (01/03/2023)  Utilities: Not At Risk (01/03/2023)  Tobacco Use: High Risk (01/03/2023)   SDOH Interventions: Housing Interventions: Intervention Not Indicated   Readmission Risk Interventions     No data to display

## 2023-01-04 NOTE — Discharge Summary (Signed)
Physician Discharge Summary   Patient: [name] MRN: [id number]  DOB: 1946-03-06   Admit:     Date of Admission: 01/02/2023 Admitted from: home   Discharge: Date of discharge: 01/04/23 Disposition: Home health Condition at discharge: good  CODE STATUS: FULL CODE      Discharge Physician: Emeterio Reeve, DO [facility]     PCP: Tracie Harrier, MD  Recommendations for Outpatient Follow-up:  Follow up with PCP Tracie Harrier, MD in 1-2 weeks Please obtain labs/tests: CT chest to reeval L lung in 2-4 weeks or sooner as needed  Please follow up on the following pending results: none PCP AND OTHER OUTPATIENT PROVIDERS: SEE BELOW FOR SPECIFIC DISCHARGE INSTRUCTIONS PRINTED FOR PATIENT IN ADDITION TO GENERIC AVS PATIENT INFO        Discharge Diagnoses: Principal Problem:   Aspiration pneumonia ([place]) Active Problems:   Multiple fractures of ribs, left side, initial encounter for closed fracture   Essential hypertension   Mediastinal adenopathy   Chronic obstructive pulmonary disease (COPD) ([place])   Dementia without behavioral disturbance ([place])   Depression   GERD without esophagitis       Hospital Course: [name] is a 77 y.o. Caucasian male with medical history significant for osteoarthritis, coronary artery disease status post PCI and statin to RCA, COPD, depression, diverticulosis, GERD, GI bleeding, dyslipidemia and tobacco abuse, who presented to the emergency room with acute onset of fall.  The patient had an accidental fall from his [place] while he was jumping down from it 4 days ago and landed on his left chest.  He admitted to having vomiting after his fall.  He has been having mild altered mental status per his daughter over the last couple of days.  He lives by himself and his daughter was checking on him today when she found him on the bathroom floor.  It is unclear if he had a syncope or not.  He denied any paresthesias or focal  muscle weakness.  He has been having dyspnea with associated cough and wheezing.   03/21: in ED, CT as below concerning for rib fx, LLL PNA question cavitary pneumonia vs mass. Admitted for concern for aspiration pneumonia / CAP.  03/22: now on room air. [facility] and known "scar" in L lung. Await BCx.  03/23: BCx NGx2d, stable on RA, still come sough but overall doing better and feels ok to go home, [facility] arranged  Consultants:  none  Procedures: none   CT chest 01/02/23 IMPRESSION: 1. Acute minimally displaced left fifth and sixth anterior rib fractures. No visible pneumothorax. 2. Emphysema. Focal subpleural consolidation in the left lower lobe with possible central cavitation, question cavitary pneumonia versus cavitary mass. Surrounding ground-glass density is probably inflammatory/infectious. Recommend short interval follow-up CT to ensure resolution. 3. Mediastinal adenopathy, slightly increased since most recent CT, possibly reactive. This may also be assessed on CT follow-up. 4. Aortic atherosclerosis.   ASSESSMENT & PLAN:  Left lower lobe community-acquired pneumonia concern for aspiration Chronic obstructive pulmonary disease (COPD) Exacerbation Acute hypoxic respiratory failure - resolved and now on room air  IV Unasyn and Zithromax --> augmentin + azithro po  Mucolytic therapy  duo nebs q.i.d. and q.4 hours p.r.n. - pt states has this at home but will refill  CT outpatient follow-up  Essential hypertension continue antihypertensives.  Dementia without behavioral disturbance (HCC) continue Aricept.  Depression continue Lexapro.  GERD without esophagitis continue  PPI .  Multiple fractures of ribs, left side, initial encounter for closed fracture minimally displaced left fifth and sixth anterior rib fractures which could have contributed to his pneumonia..  No visible pneumothorax. Pain management    Mediastinal  adenopathy likely reactive but has increased from previous CT. will need follow-up as well on outpatient basis.  Marland Kitchen         Discharge Instructions  Allergies as of 01/04/2023       Reactions   Levofloxacin Itching, Rash   Doxycycline Nausea And Vomiting        Medication List     TAKE these medications    amoxicillin-clavulanate 500-125 MG tablet Commonly known as: AUGMENTIN Take 1 tablet by mouth 2 (two) times daily for 5 days.   aspirin EC 81 MG tablet Take 1 tablet (81 mg total) by mouth daily.   azithromycin 250 MG tablet Commonly known as: Zithromax Take 1 tablet (250 mg total) by mouth daily.   chlorpheniramine-HYDROcodone 10-8 MG/5ML Commonly known as: TUSSIONEX Take 5 mLs by mouth every 12 (twelve) hours as needed for cough.   cyanocobalamin 500 MCG tablet Commonly known as: VITAMIN B12 Take 500 mcg by mouth 2 (two) times daily. VITAMIN B-12   donepezil 10 MG tablet Commonly known as: ARICEPT TAKE ONE-HALF TABLET BY MOUTH EVERY DAY FOR DEMENTIA   escitalopram 20 MG tablet Commonly known as: LEXAPRO Take 1 tablet (20 mg total) by mouth daily.   esomeprazole 40 MG capsule Commonly known as: NEXIUM Take 1 capsule by mouth daily.   folic acid 1 MG tablet Commonly known as: FOLVITE Take 1 mg by mouth daily.   gabapentin 400 MG capsule Commonly known as: NEURONTIN Take 400 mg by mouth at bedtime.   guaiFENesin 600 MG 12 hr tablet Commonly known as: MUCINEX Take 1 tablet (600 mg total) by mouth 2 (two) times daily.   ibuprofen 800 MG tablet Commonly known as: ADVIL Take 800 mg by mouth every 8 (eight) hours as needed.   ipratropium-albuterol 0.5-2.5 (3) MG/3ML Soln Commonly known as: DUONEB Take 3 mLs by nebulization every 2 (two) hours as needed (wheeze, SOB).   montelukast 10 MG tablet Commonly known as: SINGULAIR TAKE 1 TABLET AT BEDTIME   morphine 15 MG 12 hr tablet Commonly known as: MS CONTIN Take 15 mg by mouth 2 (two) times  daily.   oxyCODONE-acetaminophen 5-325 MG tablet Commonly known as: PERCOCET/ROXICET Take 1 tablet by mouth every 4 (four) hours as needed for moderate pain. For pain   promethazine 25 MG tablet Commonly known as: PHENERGAN Take 25 mg by mouth as needed.   rosuvastatin 40 MG tablet Commonly known as: CRESTOR TAKE 1 TABLET DAILY   tiotropium 18 MCG inhalation capsule Commonly known as: Spiriva HandiHaler INHALE THE CONTENTS OF 1 CAPSULE AT BEDTIME AS DIRECTED PER PACKAGE INSTRUCTIONS   Vitamin D (Ergocalciferol) 1.25 MG (50000 UNIT) Caps capsule Commonly known as: DRISDOL Take 50,000 Units by mouth every Wednesday.          Allergies  Allergen Reactions   Levofloxacin Itching and Rash   Doxycycline Nausea And Vomiting     Subjective: pt reports coughing and rib pain but otherwise feeling a lot better, no significant SOB   Discharge Exam: BP 132/61 (BP Location: Left Arm)   Pulse 65   Temp 98.4 F (36.9 C)   Resp 16   Ht 5\' 8"  (1.727 m)   Wt 68 kg   SpO2 93%   BMI 22.81  kg/m  General: Pt is alert, awake, not in acute distress Cardiovascular: RRR, S1/S2 +, no rubs, no gallops Respiratory: (+) diffuse wheezing, no rhonchi Abdominal: Soft, NT, ND, bowel sounds + Extremities: no edema, no cyanosis     The results of significant diagnostics from this hospitalization (including imaging, microbiology, ancillary and laboratory) are listed below for reference.     Microbiology: Recent Results (from the past 240 hour(s))  Resp panel by RT-PCR (RSV, Flu A&B, Covid) Anterior Nasal Swab     Status: None   Collection Time: 01/02/23  7:27 PM   Specimen: Anterior Nasal Swab  Result Value Ref Range Status   SARS Coronavirus 2 by RT PCR NEGATIVE NEGATIVE Final    Comment: (NOTE) SARS-CoV-2 target nucleic acids are NOT DETECTED.  The SARS-CoV-2 RNA is generally detectable in upper respiratory specimens during the acute phase of infection. The lowest concentration  of SARS-CoV-2 viral copies this assay can detect is 138 copies/mL. A negative result does not preclude SARS-Cov-2 infection and should not be used as the sole basis for treatment or other patient management decisions. A negative result may occur with  improper specimen collection/handling, submission of specimen other than nasopharyngeal swab, presence of viral mutation(s) within the areas targeted by this assay, and inadequate number of viral copies(<138 copies/mL). A negative result must be combined with clinical observations, patient history, and epidemiological information. The expected result is Negative.  Fact Sheet for Patients:  EntrepreneurPulse.com.au  Fact Sheet for Healthcare Providers:  IncredibleEmployment.be  This test is no t yet approved or cleared by the [place] FDA and  has been authorized for detection and/or diagnosis of SARS-CoV-2 by FDA under an Emergency Use Authorization (EUA). This EUA will remain  in effect (meaning this test can be used) for the duration of the COVID-19 declaration under Section 564(b)(1) of the Act, 21 U.S.C.section 360bbb-3(b)(1), unless the authorization is terminated  or revoked sooner.       Influenza A by PCR NEGATIVE NEGATIVE Final   Influenza B by PCR NEGATIVE NEGATIVE Final    Comment: (NOTE) The Xpert Xpress SARS-CoV-2/FLU/RSV plus assay is intended as an aid in the diagnosis of influenza from Nasopharyngeal swab specimens and should not be used as a sole basis for treatment. Nasal washings and aspirates are unacceptable for Xpert Xpress SARS-CoV-2/FLU/RSV testing.  Fact Sheet for Patients: EntrepreneurPulse.com.au  Fact Sheet for Healthcare Providers: IncredibleEmployment.be  This test is not yet approved or cleared by the [place] FDA and has been authorized for detection and/or diagnosis of SARS-CoV-2 by FDA under an Emergency Use  Authorization (EUA). This EUA will remain in effect (meaning this test can be used) for the duration of the COVID-19 declaration under Section 564(b)(1) of the Act, 21 U.S.C. section 360bbb-3(b)(1), unless the authorization is terminated or revoked.     Resp Syncytial Virus by PCR NEGATIVE NEGATIVE Final    Comment: (NOTE) Fact Sheet for Patients: EntrepreneurPulse.com.au  Fact Sheet for Healthcare Providers: IncredibleEmployment.be  This test is not yet approved or cleared by the [place] FDA and has been authorized for detection and/or diagnosis of SARS-CoV-2 by FDA under an Emergency Use Authorization (EUA). This EUA will remain in effect (meaning this test can be used) for the duration of the COVID-19 declaration under Section 564(b)(1) of the Act, 21 U.S.C. section 360bbb-3(b)(1), unless the authorization is terminated or revoked.  Performed at [facility], [address] [place], [place] [address]   Blood Culture (routine x 2)  Status: None (Preliminary result)   Collection Time: 01/02/23  9:50 PM   Specimen: BLOOD  Result Value Ref Range Status   Specimen Description BLOOD LEFT ARM  Final   Special Requests   Final    BOTTLES DRAWN AEROBIC AND ANAEROBIC Blood Culture adequate volume   Culture   Final    NO GROWTH 2 DAYS Performed at [facility], [address] [place], [place] [address]    Report Status PENDING  Incomplete  Blood Culture (routine x 2)     Status: None (Preliminary result)   Collection Time: 01/02/23  9:50 PM   Specimen: BLOOD  Result Value Ref Range Status   Specimen Description BLOOD RIGHT ARM  Final   Special Requests   Final    BOTTLES DRAWN AEROBIC AND ANAEROBIC Blood Culture adequate volume   Culture   Final    NO GROWTH 2 DAYS Performed at [facility], [address] [place], [place] [address]    Report Status PENDING  Incomplete     Labs: BNP (last 3  results) No results for input(s): "BNP" in the last 8760 hours. Basic Metabolic Panel: Recent Labs  Lab 01/02/23 1856 01/03/23 0614  NA 136 141  K 3.2* 3.7  CL 102 106  CO2 26 24  GLUCOSE 125* 130*  BUN 22 23  CREATININE 1.03 0.91  CALCIUM 9.3 9.1   Liver Function Tests: Recent Labs  Lab 01/02/23 1856  AST 18  ALT 12  ALKPHOS 99  BILITOT 0.8  PROT 7.2  ALBUMIN 3.5   No results for input(s): "LIPASE", "AMYLASE" in the last 168 hours. No results for input(s): "AMMONIA" in the last 168 hours. CBC: Recent Labs  Lab 01/02/23 1856 01/03/23 0614  WBC 7.8 10.1  HGB 11.8* 10.2*  HCT 35.3* 30.5*  MCV 99.7 99.3  PLT 140* 118*   Cardiac Enzymes: No results for input(s): "CKTOTAL", "CKMB", "CKMBINDEX", "TROPONINI" in the last 168 hours. BNP: Invalid input(s): "POCBNP" CBG: No results for input(s): "GLUCAP" in the last 168 hours. D-Dimer No results for input(s): "DDIMER" in the last 72 hours. Hgb A1c No results for input(s): "HGBA1C" in the last 72 hours. Lipid Profile No results for input(s): "CHOL", "HDL", "LDLCALC", "TRIG", "CHOLHDL", "LDLDIRECT" in the last 72 hours. Thyroid function studies No results for input(s): "TSH", "T4TOTAL", "T3FREE", "THYROIDAB" in the last 72 hours.  Invalid input(s): "FREET3" Anemia work up No results for input(s): "VITAMINB12", "FOLATE", "FERRITIN", "TIBC", "IRON", "RETICCTPCT" in the last 72 hours. Urinalysis    Component Value Date/Time   COLORURINE YELLOW (A) 03/06/2022 1450   APPEARANCEUR CLEAR (A) 03/06/2022 1450   LABSPEC >1.046 (H) 03/06/2022 1450   PHURINE 6.0 03/06/2022 1450   GLUCOSEU NEGATIVE 03/06/2022 1450   HGBUR NEGATIVE 03/06/2022 1450   BILIRUBINUR NEGATIVE 03/06/2022 1450   KETONESUR 20 (A) 03/06/2022 1450   PROTEINUR 30 (A) 03/06/2022 1450   NITRITE NEGATIVE 03/06/2022 1450   LEUKOCYTESUR NEGATIVE 03/06/2022 1450   Sepsis Labs Recent Labs  Lab 01/02/23 1856 01/03/23 0614  WBC 7.8 10.1    Microbiology Recent Results (from the past 240 hour(s))  Resp panel by RT-PCR (RSV, Flu A&B, Covid) Anterior Nasal Swab     Status: None   Collection Time: 01/02/23  7:27 PM   Specimen: Anterior Nasal Swab  Result Value Ref Range Status   SARS Coronavirus 2 by RT PCR NEGATIVE NEGATIVE Final    Comment: (NOTE) SARS-CoV-2 target nucleic acids are NOT DETECTED.  The SARS-CoV-2 RNA  is generally detectable in upper respiratory specimens during the acute phase of infection. The lowest concentration of SARS-CoV-2 viral copies this assay can detect is 138 copies/mL. A negative result does not preclude SARS-Cov-2 infection and should not be used as the sole basis for treatment or other patient management decisions. A negative result may occur with  improper specimen collection/handling, submission of specimen other than nasopharyngeal swab, presence of viral mutation(s) within the areas targeted by this assay, and inadequate number of viral copies(<138 copies/mL). A negative result must be combined with clinical observations, patient history, and epidemiological information. The expected result is Negative.  Fact Sheet for Patients:  EntrepreneurPulse.com.au  Fact Sheet for Healthcare Providers:  IncredibleEmployment.be  This test is no t yet approved or cleared by the [place] FDA and  has been authorized for detection and/or diagnosis of SARS-CoV-2 by FDA under an Emergency Use Authorization (EUA). This EUA will remain  in effect (meaning this test can be used) for the duration of the COVID-19 declaration under Section 564(b)(1) of the Act, 21 U.S.C.section 360bbb-3(b)(1), unless the authorization is terminated  or revoked sooner.       Influenza A by PCR NEGATIVE NEGATIVE Final   Influenza B by PCR NEGATIVE NEGATIVE Final    Comment: (NOTE) The Xpert Xpress SARS-CoV-2/FLU/RSV plus assay is intended as an aid in the diagnosis of  influenza from Nasopharyngeal swab specimens and should not be used as a sole basis for treatment. Nasal washings and aspirates are unacceptable for Xpert Xpress SARS-CoV-2/FLU/RSV testing.  Fact Sheet for Patients: EntrepreneurPulse.com.au  Fact Sheet for Healthcare Providers: IncredibleEmployment.be  This test is not yet approved or cleared by the [place] FDA and has been authorized for detection and/or diagnosis of SARS-CoV-2 by FDA under an Emergency Use Authorization (EUA). This EUA will remain in effect (meaning this test can be used) for the duration of the COVID-19 declaration under Section 564(b)(1) of the Act, 21 U.S.C. section 360bbb-3(b)(1), unless the authorization is terminated or revoked.     Resp Syncytial Virus by PCR NEGATIVE NEGATIVE Final    Comment: (NOTE) Fact Sheet for Patients: EntrepreneurPulse.com.au  Fact Sheet for Healthcare Providers: IncredibleEmployment.be  This test is not yet approved or cleared by the [place] FDA and has been authorized for detection and/or diagnosis of SARS-CoV-2 by FDA under an Emergency Use Authorization (EUA). This EUA will remain in effect (meaning this test can be used) for the duration of the COVID-19 declaration under Section 564(b)(1) of the Act, 21 U.S.C. section 360bbb-3(b)(1), unless the authorization is terminated or revoked.  Performed at [facility], [address] [place], [place] [address]   Blood Culture (routine x 2)     Status: None (Preliminary result)   Collection Time: 01/02/23  9:50 PM   Specimen: BLOOD  Result Value Ref Range Status   Specimen Description BLOOD LEFT ARM  Final   Special Requests   Final    BOTTLES DRAWN AEROBIC AND ANAEROBIC Blood Culture adequate volume   Culture   Final    NO GROWTH 2 DAYS Performed at [facility], [address] [place], [place] [address]    Report  Status PENDING  Incomplete  Blood Culture (routine x 2)     Status: None (Preliminary result)   Collection Time: 01/02/23  9:50 PM   Specimen: BLOOD  Result Value Ref Range Status   Specimen Description BLOOD RIGHT ARM  Final   Special Requests   Final    BOTTLES  DRAWN AEROBIC AND ANAEROBIC Blood Culture adequate volume   Culture   Final    NO GROWTH 2 DAYS Performed at [facility], [address] [place], [place] [address]    Report Status PENDING  Incomplete   Imaging CT CHEST WO CONTRAST  Result Date: 01/02/2023 CLINICAL DATA:  Fall vomiting EXAM: CT CHEST WITHOUT CONTRAST TECHNIQUE: Multidetector CT imaging of the chest was performed following the standard protocol without IV contrast. RADIATION DOSE REDUCTION: This exam was performed according to the departmental dose-optimization program which includes automated exposure control, adjustment of the mA and/or kV according to patient size and/or use of iterative reconstruction technique. COMPARISON:  Chest x-ray 04/17/2021, CT Mar 06, 2022, chest CT 09/30/2013, CT 11/06/2020 FINDINGS: Cardiovascular: Limited evaluation without intravenous contrast. Moderate aortic atherosclerosis. No aneurysm. Coronary vascular calcification. Normal cardiac size. Trace pericardial effusion. Mediastinum/Nodes: Midline trachea. No thyroid mass. Mediastinal adenopathy. AP window lymph node measuring 17 mm compared with 15 mm previously. Subcarinal lymph node measures 15 mm. Multiple additional mildly enlarged lymph nodes. Esophagus within normal limits. Lungs/Pleura: Emphysema. Focal subpleural consolidation in left lower lobe with possible central cavitation. Surrounding ground-glass density. Upper Abdomen: No acute finding Musculoskeletal: Sternum appears intact. Partially visualized hardware in the lower spine. Acute minimally displaced left fifth and sixth anterior rib fractures. IMPRESSION: 1. Acute minimally displaced left fifth and sixth anterior  rib fractures. No visible pneumothorax. 2. Emphysema. Focal subpleural consolidation in the left lower lobe with possible central cavitation, question cavitary pneumonia versus cavitary mass. Surrounding ground-glass density is probably inflammatory/infectious. Recommend short interval follow-up CT to ensure resolution. 3. Mediastinal adenopathy, slightly increased since most recent CT, possibly reactive. This may also be assessed on CT follow-up. 4. Aortic atherosclerosis. Aortic Atherosclerosis (ICD10-I70.0) and Emphysema (ICD10-J43.9). Electronically Signed   By: Donavan Foil M.D.   On: 01/02/2023 19:47   CT HEAD WO CONTRAST (5MM)  Result Date: 01/02/2023 CLINICAL DATA:  Head trauma EXAM: CT HEAD WITHOUT CONTRAST TECHNIQUE: Contiguous axial images were obtained from the base of the skull through the vertex without intravenous contrast. RADIATION DOSE REDUCTION: This exam was performed according to the departmental dose-optimization program which includes automated exposure control, adjustment of the mA and/or kV according to patient size and/or use of iterative reconstruction technique. COMPARISON:  MRI 03/22/2015 FINDINGS: Brain: No acute territorial infarction, hemorrhage, or intracranial mass. Minimal white matter hypodensity. The ventricles are non enlarged. Vascular: No hyperdense vessels.  Carotid vascular calcification Skull: Normal. Negative for fracture or focal lesion. Sinuses/Orbits: Mucosal thickening in the sinuses Other: None IMPRESSION: No CT evidence for acute intracranial abnormality. Minimal white matter disease. Electronically Signed   By: Donavan Foil M.D.   On: 01/02/2023 19:36      Time coordinating discharge: over 30 minutes  SIGNED:  Emeterio Reeve DO [facility]

## 2023-01-04 NOTE — Plan of Care (Signed)

## 2023-01-07 LAB — CULTURE, BLOOD (ROUTINE X 2)
Culture: NO GROWTH
Culture: NO GROWTH
Special Requests: ADEQUATE
Special Requests: ADEQUATE

## 2023-01-21 ENCOUNTER — Emergency Department: Payer: Medicare Other

## 2023-01-21 ENCOUNTER — Emergency Department
Admission: EM | Admit: 2023-01-21 | Discharge: 2023-01-21 | Disposition: A | Discharge status: Home / Self Care | Payer: Medicare Other | Attending: Emergency Medicine | Admitting: Emergency Medicine

## 2023-01-21 ENCOUNTER — Other Ambulatory Visit: Payer: Self-pay

## 2023-01-21 DIAGNOSIS — I10 Essential (primary) hypertension: Secondary | ICD-10-CM | POA: Insufficient documentation

## 2023-01-21 DIAGNOSIS — R4182 Altered mental status, unspecified: Secondary | ICD-10-CM | POA: Diagnosis not present

## 2023-01-21 DIAGNOSIS — N3001 Acute cystitis with hematuria: Secondary | ICD-10-CM | POA: Insufficient documentation

## 2023-01-21 DIAGNOSIS — R5383 Other fatigue: Secondary | ICD-10-CM | POA: Diagnosis present

## 2023-01-21 DIAGNOSIS — Z1152 Encounter for screening for COVID-19: Secondary | ICD-10-CM | POA: Diagnosis not present

## 2023-01-21 DIAGNOSIS — J449 Chronic obstructive pulmonary disease, unspecified: Secondary | ICD-10-CM | POA: Diagnosis not present

## 2023-01-21 DIAGNOSIS — I2510 Atherosclerotic heart disease of native coronary artery without angina pectoris: Secondary | ICD-10-CM | POA: Diagnosis not present

## 2023-01-21 LAB — COMPREHENSIVE METABOLIC PANEL
ALT: 16 U/L (ref 0–44)
AST: 24 U/L (ref 15–41)
Albumin: 3.7 g/dL (ref 3.5–5.0)
Alkaline Phosphatase: 120 U/L (ref 38–126)
Anion gap: 12 (ref 5–15)
BUN: 19 mg/dL (ref 8–23)
CO2: 25 mmol/L (ref 22–32)
Calcium: 9.7 mg/dL (ref 8.9–10.3)
Chloride: 99 mmol/L (ref 98–111)
Creatinine, Ser: 0.85 mg/dL (ref 0.61–1.24)
GFR, Estimated: >60 mL/min (ref >60)
Glucose, Bld: 135 mg/dL — ABNORMAL HIGH (ref 70–99)
Potassium: 3.5 mmol/L (ref 3.5–5.1)
Sodium: 136 mmol/L (ref 135–145)
Total Bilirubin: 1 mg/dL (ref 0.3–1.2)
Total Protein: 8.3 g/dL — ABNORMAL HIGH (ref 6.5–8.1)

## 2023-01-21 LAB — URINALYSIS, ROUTINE W REFLEX MICROSCOPIC
Bacteria, UA: NONE SEEN
Bilirubin Urine: NEGATIVE
Glucose, UA: NEGATIVE mg/dL
Hgb urine dipstick: NEGATIVE
Ketones, ur: 20 mg/dL — AB
Leukocytes,Ua: NEGATIVE
Nitrite: NEGATIVE
Protein, ur: >=300 mg/dL — AB
Specific Gravity, Urine: 1.025 (ref 1.005–1.030)
pH: 6 (ref 5.0–8.0)

## 2023-01-21 LAB — TROPONIN I (HIGH SENSITIVITY): Troponin I (High Sensitivity): 9 ng/L (ref <18)

## 2023-01-21 LAB — CBC WITH DIFFERENTIAL/PLATELET
Abs Immature Granulocytes: 0.02 10*3/uL (ref 0.00–0.07)
Basophils Absolute: 0 10*3/uL (ref 0.0–0.1)
Basophils Relative: 0 %
Eosinophils Absolute: 0 10*3/uL (ref 0.0–0.5)
Eosinophils Relative: 0 %
HCT: 36.7 % — ABNORMAL LOW (ref 39.0–52.0)
Hemoglobin: 12.1 g/dL — ABNORMAL LOW (ref 13.0–17.0)
Immature Granulocytes: 0 %
Lymphocytes Relative: 11 %
Lymphs Abs: 0.7 10*3/uL (ref 0.7–4.0)
MCH: 32.3 pg (ref 26.0–34.0)
MCHC: 33 g/dL (ref 30.0–36.0)
MCV: 97.9 fL (ref 80.0–100.0)
Monocytes Absolute: 0.4 10*3/uL (ref 0.1–1.0)
Monocytes Relative: 6 %
Neutro Abs: 5.2 10*3/uL (ref 1.7–7.7)
Neutrophils Relative %: 83 %
Platelets: 242 10*3/uL (ref 150–400)
RBC: 3.75 MIL/uL — ABNORMAL LOW (ref 4.22–5.81)
RDW: 13.6 % (ref 11.5–15.5)
WBC: 6.2 10*3/uL (ref 4.0–10.5)
nRBC: 0 % (ref 0.0–0.2)

## 2023-01-21 LAB — LIPASE, BLOOD: Lipase: 28 U/L (ref 11–51)

## 2023-01-21 LAB — CK: Total CK: 42 U/L — ABNORMAL LOW (ref 49–397)

## 2023-01-21 LAB — URINE DRUG SCREEN, QUALITATIVE (ARMC ONLY)
Amphetamines, Ur Screen: NOT DETECTED
Barbiturates, Ur Screen: NOT DETECTED
Benzodiazepine, Ur Scrn: NOT DETECTED
Cannabinoid 50 Ng, Ur ~~LOC~~: NOT DETECTED
Cocaine Metabolite,Ur ~~LOC~~: NOT DETECTED
MDMA (Ecstasy)Ur Screen: NOT DETECTED
Methadone Scn, Ur: NOT DETECTED
Opiate, Ur Screen: POSITIVE — AB
Phencyclidine (PCP) Ur S: NOT DETECTED
Tricyclic, Ur Screen: NOT DETECTED

## 2023-01-21 LAB — RESP PANEL BY RT-PCR (RSV, FLU A&B, COVID)  RVPGX2
Influenza A by PCR: NEGATIVE
Influenza B by PCR: NEGATIVE
Resp Syncytial Virus by PCR: NEGATIVE
SARS Coronavirus 2 by RT PCR: NEGATIVE

## 2023-01-21 MED ORDER — SODIUM CHLORIDE 0.9 % IV BOLUS
500.0000 mL | Freq: Once | INTRAVENOUS | Status: AC
  Administered 2023-01-21: 1000 mL via INTRAVENOUS

## 2023-01-21 MED ORDER — SODIUM CHLORIDE 0.9 % IV SOLN
1.0000 g | Freq: Once | INTRAVENOUS | Status: AC
  Administered 2023-01-21: 1 g via INTRAVENOUS
  Filled 2023-01-21: qty 10

## 2023-01-21 MED ORDER — CEPHALEXIN 500 MG PO CAPS: 500.0000 mg | ORAL_CAPSULE | Freq: Four times a day (QID) | ORAL | 0 refills | Status: AC

## 2023-01-21 MED ORDER — SODIUM CHLORIDE 0.9 % IV BOLUS: 500.0000 mL | Freq: Once | INTRAVENOUS | Status: DC

## 2023-01-21 NOTE — ED Provider Notes (Signed)
[name] Provider Note  Patient Contact: 3:14 PM (approximate)   History   Fatigue   HPI  [name] is a 77 y.o. male with a history of coronary artery disease, hypertension, COPD, anemia, prior GI bleed, hyperkalemia, GERD, presents to the emergency department with pain all over.  Patient states that he does not know why he is here.  I spoke with patient's daughter on the phone who is medical healthcare power of attorney, Tykwon Chrisler who states that patient has been more confused since sustaining rib fractures in March.  Patient's daughter speculates that patient has been taking pain medicine at home that has been given to him by other daughter.  Patient is also prone to urinary tract infections.      Physical Exam   Triage Vital Signs: ED Triage Vitals  Enc Vitals Group     BP 01/21/23 1320 (!) 158/92     Pulse Rate 01/21/23 1320 77     Resp 01/21/23 1320 18     Temp 01/21/23 1320 98.2 F (36.8 C)     Temp Source 01/21/23 1320 Oral     SpO2 01/21/23 1320 98 %     Weight --      Height --      Head Circumference --      Peak Flow --      Pain Score 01/21/23 1319 0     Pain Loc --      Pain Edu? --      Excl. in GC? --     Most recent vital signs: Vitals:   01/21/23 1700 01/21/23 2000  BP: (!) 150/88 (!) 148/86  Pulse: 74 74  Resp: 18 18  Temp: 98.2 F (36.8 C) 98.4 F (36.9 C)  SpO2: 98% 98%     General: Alert and in no acute distress. Eyes:  PERRL. EOMI. Head: No acute traumatic findings ENT:      Nose: No congestion/rhinnorhea.      Mouth/Throat: Mucous membranes are moist. Neck: No stridor. No cervical spine tenderness to palpation. Cardiovascular:  Good peripheral perfusion Respiratory: Normal respiratory effort without tachypnea or retractions. Lungs CTAB. Good air entry to the bases with no decreased or absent breath sounds. Gastrointestinal: Bowel sounds 4 quadrants. Soft and nontender to palpation. No  guarding or rigidity. No palpable masses. No distention. No CVA tenderness. Musculoskeletal: Full range of motion to all extremities.  Neurologic:  No gross focal neurologic deficits are appreciated.  Skin:   No rash noted    ED Results / Procedures / Treatments   Labs (all labs ordered are listed, but only abnormal results are displayed) Labs Reviewed  CBC WITH DIFFERENTIAL/PLATELET - Abnormal; Notable for the following components:      Result Value   RBC 3.75 (*)    Hemoglobin 12.1 (*)    HCT 36.7 (*)    All other components within normal limits  COMPREHENSIVE METABOLIC PANEL - Abnormal; Notable for the following components:   Glucose, Bld 135 (*)    Total Protein 8.3 (*)    All other components within normal limits  URINALYSIS, ROUTINE W REFLEX MICROSCOPIC - Abnormal; Notable for the following components:   Color, Urine AMBER (*)    APPearance CLOUDY (*)    Ketones, ur 20 (*)    Protein, ur >=300 (*)    All other components within normal limits  URINE DRUG SCREEN, QUALITATIVE ([facility] ONLY) - Abnormal; Notable for the following components:  Opiate, Ur Screen POSITIVE (*)    All other components within normal limits  CK - Abnormal; Notable for the following components:   Total CK 42 (*)    All other components within normal limits  RESP PANEL BY RT-PCR (RSV, FLU A&B, COVID)  RVPGX2  LIPASE, BLOOD  TROPONIN I (HIGH SENSITIVITY)     EKG  Normal sinus rhythm without ST segment elevation or other apparent arrhythmia.   RADIOLOGY  I personally viewed and evaluated these images as part of my medical decision making, as well as reviewing the written report by the radiologist.  ED Provider Interpretation:   Head CT shows no acute abnormality.  Chest x-ray unremarkable.    PROCEDURES:  Critical Care performed: No  Procedures   MEDICATIONS ORDERED IN ED: Medications  sodium chloride 0.9 % bolus 500 mL (500 mLs Intravenous Not Given 01/21/23 1923)  sodium chloride  0.9 % bolus 500 mL (0 mLs Intravenous Stopped 01/21/23 2002)  cefTRIAXone (ROCEPHIN) 1 g in sodium chloride 0.9 % 100 mL IVPB (1 g Intravenous New Bag/Given 01/21/23 2049)     IMPRESSION / MDM / ASSESSMENT AND PLAN / ED COURSE  I reviewed the triage vital signs and the nursing notes.                              Assessment and plan: Altered mental status:   77 year old male presents to the emergency department with some mild confusion and whole body pain.  Vital signs were reassuring at triage.  On exam, patient was able to provide only limited historical details.  I did reach out to patient's daughter who endorses some mild confusion for the past month that she speculates is secondary to pain medication usage.  Chest x-ray and CT head unremarkable.   Laboratory studies largely unremarkable.  Patient did have 6-10 red blood cells and white blood cells identified on urinalysis.  Given patient's history of UTIs, will treat patient with IV Rocephin and Keflex at discharge.  He was given normal saline bolus.  Upon recheck, patient was able to converse easily and felt improved and requested discharge.  Return precautions were given to return with new or worsening symptoms.  All patient questions were answered.  FINAL CLINICAL IMPRESSION(S) / ED DIAGNOSES   Final diagnoses:  Acute cystitis with hematuria     Rx / DC Orders   ED Discharge Orders          Ordered    cephALEXin (KEFLEX) 500 MG capsule  4 times daily        01/21/23 2112             Note:  This document was prepared using Dragon voice recognition software and may include unintentional dictation errors.   Pia MauWoods, Remington [place], Cordelia Poche-C 01/21/23 2136    Jene EveryKinner, Robert, MD 01/26/23 1056

## 2023-01-21 NOTE — Discharge Instructions (Signed)
Take Keflex four times daily for the next seven days.  

## 2023-01-21 NOTE — ED Notes (Signed)
First Nurse Note: Patient to ED via [facility] from home. Patient c/o generalized not feeling well with nausea and a cough since Thursday. Decreased appetite and not sleeping well.  EMS VS 98.2 158/74 74 HR 98% RA 137 cbg

## 2023-01-21 NOTE — ED Notes (Signed)
Per [place] PA, bolis

## 2023-01-21 NOTE — ED Notes (Signed)
Daughter asked for staff to call [name] upon discharge [phone]

## 2023-03-26 ENCOUNTER — Other Ambulatory Visit (HOSPITAL_BASED_OUTPATIENT_CLINIC_OR_DEPARTMENT_OTHER): Payer: Self-pay | Admitting: Nurse Practitioner

## 2023-03-26 DIAGNOSIS — E7800 Pure hypercholesterolemia, unspecified: Secondary | ICD-10-CM

## 2023-03-26 DIAGNOSIS — I10 Essential (primary) hypertension: Secondary | ICD-10-CM

## 2023-03-26 DIAGNOSIS — I2510 Atherosclerotic heart disease of native coronary artery without angina pectoris: Secondary | ICD-10-CM

## 2023-04-22 ENCOUNTER — Other Ambulatory Visit (HOSPITAL_BASED_OUTPATIENT_CLINIC_OR_DEPARTMENT_OTHER): Payer: Self-pay | Admitting: Nurse Practitioner

## 2023-04-22 DIAGNOSIS — I2510 Atherosclerotic heart disease of native coronary artery without angina pectoris: Secondary | ICD-10-CM

## 2023-04-22 DIAGNOSIS — E7800 Pure hypercholesterolemia, unspecified: Secondary | ICD-10-CM

## 2023-04-22 DIAGNOSIS — I10 Essential (primary) hypertension: Secondary | ICD-10-CM

## 2023-05-05 ENCOUNTER — Telehealth: Payer: Self-pay | Admitting: Cardiovascular Disease

## 2023-05-05 DIAGNOSIS — I10 Essential (primary) hypertension: Secondary | ICD-10-CM

## 2023-05-05 DIAGNOSIS — I2510 Atherosclerotic heart disease of native coronary artery without angina pectoris: Secondary | ICD-10-CM

## 2023-05-05 DIAGNOSIS — E7800 Pure hypercholesterolemia, unspecified: Secondary | ICD-10-CM

## 2023-05-05 MED ORDER — ROSUVASTATIN CALCIUM 40 MG PO TABS
40.0000 mg | ORAL_TABLET | Freq: Every day | ORAL | 0 refills | Status: DC
Start: 2023-05-05 — End: 2023-06-04

## 2023-05-05 NOTE — Telephone Encounter (Signed)
Pt has been rescheduled to see Tereso Newcomer, PA-C, 8/;21/24, 30 day refill for Rosuvastatin to [facility].

## 2023-05-05 NOTE — Telephone Encounter (Signed)
*  STAT* If patient is at the pharmacy, call can be transferred to refill team.   1. Which medications need to be refilled? (please list name of each medication and dose if known) rosuvastatin (CRESTOR) 40 MG tablet   2. Which pharmacy/location (including street and city if local pharmacy) is medication to be sent to? [facility] - [place], [place] - [address]    3. Do they need a 30 day or 90 day supply? 90 Day Supply   Pt has an appt scheduled on 10/02.

## 2023-06-04 ENCOUNTER — Telehealth (HOSPITAL_COMMUNITY): Payer: Self-pay | Admitting: *Deleted

## 2023-06-04 ENCOUNTER — Ambulatory Visit: Payer: Medicare Other | Attending: Physician Assistant | Admitting: Physician Assistant

## 2023-06-04 ENCOUNTER — Encounter: Payer: Self-pay | Admitting: Physician Assistant

## 2023-06-04 VITALS — BP 132/64 | HR 76 | Ht 68.0 in | Wt 127.2 lb

## 2023-06-04 DIAGNOSIS — I10 Essential (primary) hypertension: Secondary | ICD-10-CM

## 2023-06-04 DIAGNOSIS — R0602 Shortness of breath: Secondary | ICD-10-CM | POA: Diagnosis present

## 2023-06-04 DIAGNOSIS — Z01818 Encounter for other preprocedural examination: Secondary | ICD-10-CM

## 2023-06-04 DIAGNOSIS — I2510 Atherosclerotic heart disease of native coronary artery without angina pectoris: Secondary | ICD-10-CM | POA: Diagnosis not present

## 2023-06-04 DIAGNOSIS — C3490 Malignant neoplasm of unspecified part of unspecified bronchus or lung: Secondary | ICD-10-CM | POA: Diagnosis not present

## 2023-06-04 DIAGNOSIS — E7800 Pure hypercholesterolemia, unspecified: Secondary | ICD-10-CM | POA: Diagnosis present

## 2023-06-04 DIAGNOSIS — I25119 Atherosclerotic heart disease of native coronary artery with unspecified angina pectoris: Secondary | ICD-10-CM

## 2023-06-04 HISTORY — DX: Malignant neoplasm of unspecified part of unspecified bronchus or lung: C34.90

## 2023-06-04 MED ORDER — ROSUVASTATIN CALCIUM 40 MG PO TABS
40.0000 mg | ORAL_TABLET | Freq: Every day | ORAL | 3 refills | Status: DC
Start: 2023-06-04 — End: 2024-08-16

## 2023-06-04 NOTE — Assessment & Plan Note (Signed)
No longer on medications due to low blood pressure.  Blood pressure is controlled.

## 2023-06-04 NOTE — Progress Notes (Addendum)
Cardiology Office Note:    Date:  06/04/2023  ID:  [name], DOB 1946/05/04, MRN [id number] PCP: Barbette Reichmann, MD  [facility] Providers Cardiologist:  Verne Carrow, MD       Patient Profile:      Coronary artery disease S/p stent to RCA in 1999 LHC 01/22/2018: Mid RCA stent patent with 40 ISR, distal RCA 20, PAD 40, ostial RPDA 40; proximal LCx 30; mid LM 40; ostial D1 60; EF 55-65>> med Rx Carotid stenosis Carotid US 12/26/2021: R ICA 1-39 Hypertension Hyperlipidemia Lung CA s/p radiation [facility]) Chronic Obstructive Pulmonary Disease Tobacco use GERD Hx GI bleed Spinal stenosis  Memory loss           Discussed the use of AI scribe software for clinical note transcription with the patient, who gave verbal consent to proceed.  History of Present Illness   [name], a 77 year old with a history of CAD, hypertension, hyperlipidemia, COPD, and lung cancer, presents for follow up and preoperative evaluation for a lung biopsy. He had a stent placed in the RCA in 1999, and a heart catheterization in 2019 showed a patent stent in the RCA and moderate non-obstructive disease elsewhere. He reports increasing weakness and shortness of breath, reminiscent of his symptoms prior to stent placement. He denies chest pain. He also denies throat and dental pain like he had with his prior angina. He has chronic shortness of breath and notes an increase in mucus production. He is unable to lay flat due to shortness of breath but denies leg swelling, syncope, and bleeding. He continues to smoke, albeit reduced to 8-10 cigarettes per day.    ROS:  See HPI No melena, hematochezia    Studies Reviewed:   EKG Interpretation Date/Time:  Wednesday June 04 2023 15:43:34 EDT Ventricular Rate:  66 PR Interval:  144 QRS Duration:  88 QT Interval:  452 QTC Calculation: 473 R Axis:   89  Text Interpretation: Normal sinus rhythm Normal ECG When compared with ECG of  21-Jan-2023 16:33, No significant change was found Confirmed by Tereso Newcomer [phone]) on 06/04/2023 3:46:14 PM    Risk Assessment/Calculations:             Physical Exam:   VS:  BP 132/64   Pulse 76   Ht 5\' 8"  (1.727 m)   Wt 127 lb 3.2 oz (57.7 kg)   SpO2 91%   BMI 19.34 kg/m    Wt Readings from Last 3 Encounters:  06/04/23 127 lb 3.2 oz (57.7 kg)  01/02/23 150 lb (68 kg)  11/30/21 148 lb (67.1 kg)    Constitutional:      Appearance: Healthy appearance. Not in distress.  Neck:     Vascular: No JVR. JVD normal.  Pulmonary:     Breath sounds: Diffuse Rhonchi present.  Cardiovascular:     Normal rate. Regular rhythm.     Murmurs: There is no murmur.  Edema:    Peripheral edema absent.  Abdominal:     Palpations: Abdomen is soft.         Assessment and Plan:  CAD (coronary artery disease) History of RCA stent placement in 1999, last catheterization in 2019 showed patent stent and moderate non-obstructive disease elsewhere. Currently on Aspirin 81mg  and Crestor 40mg  daily. Reports increased weakness and shortness of breath, reminiscent of pre-stent symptoms, but denies chest pain or discomfort. EKG today showed no changes.  He needs surgical clearance for upcoming lung biopsy. -Schedule for pharmacological stress test  to assess patency of stent and for any new blockages. -Schedule for echocardiogram to assess cardiac function and pressures.  Preoperative clearance History of lung cancer s/p radiation therapy.  [facility] is [facility] at the [facility].  He is now scheduled for biopsy under general anesthesia on June 17, 2023.  He needs surgical clearance. -Arrange Lexiscan Myoview -Arrange 2D echocardiogram -Further recommendations to follow. ADDENDUM: - Myoview 06/05/23: EF 61, normal perfusion, low risk - TTE 06/09/2023: EF 60-65, no RWMA, GR 1 DD, normal RVSF, mild pulmonary hypertension (RVSP 37.3), trivial MR, AV sclerosis, RAP 3 Recent stress test was low risk.  Echocardiogram shows normal EF and no significant valve disease. His risk of perioperative major cardiac events is overall increased based upon his history. However, give his reassuring stress test and echocardiogram results, he may proceed with the planned procedure at acceptable risk without further testing.  Ideally, we recommend continuing ASA w/o interruption. However, if the bleeding risk is too great, ASA can be held 7 days and resumed post op as soon as possible.  Pure hypercholesterolemia On Crestor 40mg  daily. LDL optimal at 34 in April 2024. -Continue current medication regimen.  Essential hypertension No longer on medications due to low blood pressure.  Blood pressure is controlled.  Lung cancer [facility]) Followed by oncology at [facility]. Notes will need to go to [facility] at the [facility] once Echocardiogram and Myoview complete.   Informed Consent   Shared Decision Making/Informed Consent The risks [chest pain, shortness of breath, cardiac arrhythmias, dizziness, blood pressure fluctuations, myocardial infarction, stroke/transient ischemic attack, nausea, vomiting, allergic reaction, radiation exposure, metallic taste sensation and life-threatening complications (estimated to be 1 in 10,000)], benefits (risk stratification, diagnosing coronary artery disease, treatment guidance) and alternatives of a nuclear stress test were discussed in detail with [name] and he agrees to proceed.     Dispo:  Return in about 1 year (around 06/03/2024) for Routine Follow Up w/ Dr. Clifton James.  Signed, Tereso Newcomer, PA-C

## 2023-06-04 NOTE — Assessment & Plan Note (Addendum)
History of lung cancer s/p radiation therapy.  He is followed at the [facility].  He is now scheduled for biopsy under general anesthesia on June 17, 2023.  He needs surgical clearance. -Arrange Lexiscan Myoview -Arrange 2D echocardiogram -Further recommendations to follow.

## 2023-06-04 NOTE — Patient Instructions (Addendum)
Medication Instructions:  Your physician recommends that you continue on your current medications as directed. Please refer to the Current Medication list given to you today.  *If you need a refill on your cardiac medications before your next appointment, please call your pharmacy*   Lab Work: None ordered  If you have labs (blood work) drawn today and your tests are completely normal, you will receive your results only by: MyChart Message (if you have MyChart) OR A paper copy in the mail If you have any lab test that is abnormal or we need to change your treatment, we will call you to review the results.   Testing/Procedures: Your physician has requested that you have an echocardiogram  MONDAY, 06/09/23, ARRIVE AT 12:45. Echocardiography is a painless test that uses sound waves to create images of your heart. It provides your doctor with information about the size and shape of your heart and how well your heart's chambers and valves are working. This procedure takes approximately one hour. There are no restrictions for this procedure. Please do NOT wear cologne, perfume, aftershave, or lotions (deodorant is allowed). Please arrive 15 minutes prior to your appointment time.   Your physician has requested that you have a lexiscan myoview. For further information please visit [email or web address] Please follow instruction sheet, BELOW:    You are scheduled for a Myocardial Perfusion Imaging Study TOMORROW, 06/05/23, ARRIVE AT 10:15. Please arrive 15 minutes prior to your appointment time for registration and insurance purposes.  The test will take approximately 3 to 4 hours to complete; you may bring reading material.  If someone comes with you to your appointment, they will need to remain in the main lobby due to limited space in the testing area. **If you are pregnant or breastfeeding, please notify the nuclear lab prior to your appointment**  How to prepare for your Myocardial Perfusion  Test: Do not eat or drink 3 hours prior to your test, except you may have water. Do not consume products containing caffeine (regular or decaffeinated) 12 hours prior to your test. (ex: coffee, chocolate, sodas, tea). Do bring a list of your current medications with you.  If not listed below, you may take your medications as normal. Do wear comfortable clothes (no dresses or overalls) and walking shoes, tennis shoes preferred (No heels or open toe shoes are allowed). Do NOT wear cologne, perfume, aftershave, or lotions (deodorant is allowed). If these instructions are not followed, your test will have to be rescheduled.     Follow-Up: At [facility], you and your health needs are our priority.  As part of our continuing mission to provide you with exceptional heart care, we have created designated Provider Care Teams.  These Care Teams include your primary Cardiologist (physician) and Advanced Practice Providers (APPs -  Physician Assistants and Nurse Practitioners) who all work together to provide you with the care you need, when you need it.  We recommend signing up for the patient portal called "MyChart".  Sign up information is provided on this After Visit Summary.  MyChart is used to connect with patients for Virtual Visits (Telemedicine).  Patients are able to view lab/test results, encounter notes, upcoming appointments, etc.  Non-urgent messages can be sent to your provider as well.   To learn more about what you can do with MyChart, go to [email or web address].    Your next appointment:   12 month(s)  Provider:   Verne Carrow, MD     Other Instructions

## 2023-06-04 NOTE — Telephone Encounter (Signed)
Attempted calling patient to go over instructions.  Per DPR, cannot leave any messages on phone.

## 2023-06-04 NOTE — Assessment & Plan Note (Signed)
On Crestor 40mg  daily. LDL optimal at 34 in April 2024. -Continue current medication regimen.

## 2023-06-04 NOTE — Assessment & Plan Note (Signed)
History of RCA stent placement in 1999, last catheterization in 2019 showed patent stent and moderate non-obstructive disease elsewhere. Currently on Aspirin 81mg  and Crestor 40mg  daily. Reports increased weakness and shortness of breath, reminiscent of pre-stent symptoms, but denies chest pain or discomfort. EKG today showed no changes.  He needs surgical clearance for upcoming lung biopsy. -Schedule for pharmacological stress test to assess patency of stent and for any new blockages. -Schedule for echocardiogram to assess cardiac function and pressures.

## 2023-06-04 NOTE — Assessment & Plan Note (Signed)
Followed by oncology at [facility]. Notes will need to go to [facility] at the [facility] once Echocardiogram and Myoview complete.

## 2023-06-05 ENCOUNTER — Ambulatory Visit (HOSPITAL_COMMUNITY): Payer: Medicare Other | Attending: Physician Assistant

## 2023-06-05 DIAGNOSIS — E7800 Pure hypercholesterolemia, unspecified: Secondary | ICD-10-CM | POA: Diagnosis present

## 2023-06-05 DIAGNOSIS — R0602 Shortness of breath: Secondary | ICD-10-CM

## 2023-06-05 DIAGNOSIS — I2510 Atherosclerotic heart disease of native coronary artery without angina pectoris: Secondary | ICD-10-CM | POA: Diagnosis present

## 2023-06-05 DIAGNOSIS — I10 Essential (primary) hypertension: Secondary | ICD-10-CM | POA: Diagnosis present

## 2023-06-05 DIAGNOSIS — C3490 Malignant neoplasm of unspecified part of unspecified bronchus or lung: Secondary | ICD-10-CM | POA: Insufficient documentation

## 2023-06-05 DIAGNOSIS — Z01818 Encounter for other preprocedural examination: Secondary | ICD-10-CM | POA: Diagnosis present

## 2023-06-05 LAB — MYOCARDIAL PERFUSION IMAGING
LV dias vol: 55 mL (ref 62–150)
LV sys vol: 21 mL
Nuc Stress EF: 61 %
Peak HR: 93 {beats}/min
Rest HR: 65 {beats}/min
Rest Nuclear Isotope Dose: 10.9 mCi
SDS: 3
SRS: 1
SSS: 4
ST Depression (mm): 0 mm
Stress Nuclear Isotope Dose: 32.6 mCi
TID: 1.08

## 2023-06-05 MED ORDER — REGADENOSON 0.4 MG/5ML IV SOLN
0.4000 mg | Freq: Once | INTRAVENOUS | Status: AC
Start: 2023-06-05 — End: 2023-06-05
  Administered 2023-06-05: 0.4 mg via INTRAVENOUS

## 2023-06-05 MED ORDER — TECHNETIUM TC 99M TETROFOSMIN IV KIT
10.9000 | PACK | Freq: Once | INTRAVENOUS | Status: AC | PRN
  Administered 2023-06-05: 10.9 via INTRAVENOUS

## 2023-06-05 MED ORDER — TECHNETIUM TC 99M TETROFOSMIN IV KIT
32.6000 | PACK | Freq: Once | INTRAVENOUS | Status: AC | PRN
  Administered 2023-06-05: 32.6 via INTRAVENOUS

## 2023-06-06 ENCOUNTER — Encounter: Payer: Self-pay | Admitting: Physician Assistant

## 2023-06-09 ENCOUNTER — Ambulatory Visit (HOSPITAL_COMMUNITY): Payer: Medicare Other | Attending: Physician Assistant

## 2023-06-09 DIAGNOSIS — I10 Essential (primary) hypertension: Secondary | ICD-10-CM | POA: Diagnosis present

## 2023-06-09 DIAGNOSIS — Z01818 Encounter for other preprocedural examination: Secondary | ICD-10-CM

## 2023-06-09 DIAGNOSIS — E7800 Pure hypercholesterolemia, unspecified: Secondary | ICD-10-CM | POA: Diagnosis present

## 2023-06-09 DIAGNOSIS — C3490 Malignant neoplasm of unspecified part of unspecified bronchus or lung: Secondary | ICD-10-CM

## 2023-06-09 DIAGNOSIS — I2510 Atherosclerotic heart disease of native coronary artery without angina pectoris: Secondary | ICD-10-CM | POA: Diagnosis present

## 2023-06-09 DIAGNOSIS — R0602 Shortness of breath: Secondary | ICD-10-CM

## 2023-06-09 LAB — ECHOCARDIOGRAM COMPLETE
Area-P 1/2: 3.17 cm2
S' Lateral: 2.2 cm

## 2023-06-11 ENCOUNTER — Telehealth: Payer: Self-pay | Admitting: Cardiovascular Disease

## 2023-06-11 NOTE — Telephone Encounter (Signed)
Pt advised his Echo results and verbalized understanding. He is aware that he is cleared for surgery.

## 2023-06-11 NOTE — Telephone Encounter (Signed)
Patient is returning call in regards to echo results. Requesting call back.

## 2023-06-17 ENCOUNTER — Emergency Department: Payer: Medicare Other

## 2023-06-17 ENCOUNTER — Other Ambulatory Visit: Payer: Self-pay

## 2023-06-17 ENCOUNTER — Inpatient Hospital Stay
Admission: EM | Admit: 2023-06-17 | Discharge: 2023-06-20 | DRG: 178 | Disposition: A | Discharge status: Home Health | Payer: Medicare Other | Attending: Internal Medicine | Admitting: Internal Medicine

## 2023-06-17 DIAGNOSIS — R1310 Dysphagia, unspecified: Secondary | ICD-10-CM | POA: Diagnosis not present

## 2023-06-17 DIAGNOSIS — I10 Essential (primary) hypertension: Secondary | ICD-10-CM | POA: Diagnosis present

## 2023-06-17 DIAGNOSIS — C3490 Malignant neoplasm of unspecified part of unspecified bronchus or lung: Secondary | ICD-10-CM | POA: Diagnosis present

## 2023-06-17 DIAGNOSIS — Z79899 Other long term (current) drug therapy: Secondary | ICD-10-CM

## 2023-06-17 DIAGNOSIS — G9349 Other encephalopathy: Secondary | ICD-10-CM | POA: Diagnosis not present

## 2023-06-17 DIAGNOSIS — E871 Hypo-osmolality and hyponatremia: Secondary | ICD-10-CM | POA: Diagnosis not present

## 2023-06-17 DIAGNOSIS — Z820 Family history of epilepsy and other diseases of the nervous system: Secondary | ICD-10-CM | POA: Diagnosis not present

## 2023-06-17 DIAGNOSIS — J449 Chronic obstructive pulmonary disease, unspecified: Secondary | ICD-10-CM | POA: Diagnosis present

## 2023-06-17 DIAGNOSIS — U071 COVID-19: Principal | ICD-10-CM

## 2023-06-17 DIAGNOSIS — E876 Hypokalemia: Secondary | ICD-10-CM | POA: Diagnosis not present

## 2023-06-17 DIAGNOSIS — E785 Hyperlipidemia, unspecified: Secondary | ICD-10-CM | POA: Diagnosis present

## 2023-06-17 DIAGNOSIS — F0393 Unspecified dementia, unspecified severity, with mood disturbance: Secondary | ICD-10-CM | POA: Diagnosis present

## 2023-06-17 DIAGNOSIS — I2510 Atherosclerotic heart disease of native coronary artery without angina pectoris: Secondary | ICD-10-CM | POA: Diagnosis present

## 2023-06-17 DIAGNOSIS — Z7982 Long term (current) use of aspirin: Secondary | ICD-10-CM

## 2023-06-17 DIAGNOSIS — Z833 Family history of diabetes mellitus: Secondary | ICD-10-CM | POA: Diagnosis not present

## 2023-06-17 DIAGNOSIS — F32A Depression, unspecified: Secondary | ICD-10-CM | POA: Diagnosis present

## 2023-06-17 DIAGNOSIS — Z951 Presence of aortocoronary bypass graft: Secondary | ICD-10-CM

## 2023-06-17 DIAGNOSIS — E440 Moderate protein-calorie malnutrition: Secondary | ICD-10-CM | POA: Diagnosis present

## 2023-06-17 DIAGNOSIS — A0839 Other viral enteritis: Secondary | ICD-10-CM | POA: Diagnosis present

## 2023-06-17 DIAGNOSIS — Z8249 Family history of ischemic heart disease and other diseases of the circulatory system: Secondary | ICD-10-CM | POA: Diagnosis not present

## 2023-06-17 DIAGNOSIS — R918 Other nonspecific abnormal finding of lung field: Secondary | ICD-10-CM | POA: Diagnosis present

## 2023-06-17 DIAGNOSIS — F17210 Nicotine dependence, cigarettes, uncomplicated: Secondary | ICD-10-CM | POA: Diagnosis present

## 2023-06-17 DIAGNOSIS — Z681 Body mass index (BMI) 19 or less, adult: Secondary | ICD-10-CM

## 2023-06-17 DIAGNOSIS — G9340 Encephalopathy, unspecified: Secondary | ICD-10-CM

## 2023-06-17 DIAGNOSIS — F0390 Unspecified dementia without behavioral disturbance: Secondary | ICD-10-CM | POA: Diagnosis present

## 2023-06-17 DIAGNOSIS — K219 Gastro-esophageal reflux disease without esophagitis: Secondary | ICD-10-CM | POA: Diagnosis present

## 2023-06-17 DIAGNOSIS — M419 Scoliosis, unspecified: Secondary | ICD-10-CM | POA: Diagnosis present

## 2023-06-17 DIAGNOSIS — J441 Chronic obstructive pulmonary disease with (acute) exacerbation: Secondary | ICD-10-CM | POA: Diagnosis present

## 2023-06-17 DIAGNOSIS — Z888 Allergy status to other drugs, medicaments and biological substances status: Secondary | ICD-10-CM | POA: Diagnosis not present

## 2023-06-17 LAB — CBC WITH DIFFERENTIAL/PLATELET
Abs Immature Granulocytes: 0.01 10*3/uL (ref 0.00–0.07)
Basophils Absolute: 0 10*3/uL (ref 0.0–0.1)
Basophils Relative: 0 %
Eosinophils Absolute: 0 10*3/uL (ref 0.0–0.5)
Eosinophils Relative: 0 %
HCT: 37.8 % — ABNORMAL LOW (ref 39.0–52.0)
Hemoglobin: 12.6 g/dL — ABNORMAL LOW (ref 13.0–17.0)
Immature Granulocytes: 0 %
Lymphocytes Relative: 12 %
Lymphs Abs: 0.7 10*3/uL (ref 0.7–4.0)
MCH: 31.7 pg (ref 26.0–34.0)
MCHC: 33.3 g/dL (ref 30.0–36.0)
MCV: 95 fL (ref 80.0–100.0)
Monocytes Absolute: 0.4 10*3/uL (ref 0.1–1.0)
Monocytes Relative: 7 %
Neutro Abs: 4.4 10*3/uL (ref 1.7–7.7)
Neutrophils Relative %: 81 %
Platelets: 197 10*3/uL (ref 150–400)
RBC: 3.98 MIL/uL — ABNORMAL LOW (ref 4.22–5.81)
RDW: 13.8 % (ref 11.5–15.5)
WBC: 5.5 10*3/uL (ref 4.0–10.5)
nRBC: 0 % (ref 0.0–0.2)

## 2023-06-17 LAB — COMPREHENSIVE METABOLIC PANEL
ALT: 12 U/L (ref 0–44)
AST: 20 U/L (ref 15–41)
Albumin: 3.2 g/dL — ABNORMAL LOW (ref 3.5–5.0)
Alkaline Phosphatase: 108 U/L (ref 38–126)
Anion gap: 9 (ref 5–15)
BUN: 16 mg/dL (ref 8–23)
CO2: 27 mmol/L (ref 22–32)
Calcium: 8.8 mg/dL — ABNORMAL LOW (ref 8.9–10.3)
Chloride: 99 mmol/L (ref 98–111)
Creatinine, Ser: 0.68 mg/dL (ref 0.61–1.24)
GFR, Estimated: >60 mL/min (ref >60)
Glucose, Bld: 116 mg/dL — ABNORMAL HIGH (ref 70–99)
Potassium: 3.3 mmol/L — ABNORMAL LOW (ref 3.5–5.1)
Sodium: 135 mmol/L (ref 135–145)
Total Bilirubin: 0.6 mg/dL (ref 0.3–1.2)
Total Protein: 7.7 g/dL (ref 6.5–8.1)

## 2023-06-17 LAB — RESP PANEL BY RT-PCR (RSV, FLU A&B, COVID)  RVPGX2
Influenza A by PCR: NEGATIVE
Influenza B by PCR: NEGATIVE
Resp Syncytial Virus by PCR: NEGATIVE
SARS Coronavirus 2 by RT PCR: POSITIVE — AB

## 2023-06-17 LAB — LIPASE, BLOOD: Lipase: 30 U/L (ref 11–51)

## 2023-06-17 MED ORDER — ASPIRIN 81 MG PO TBEC
81.0000 mg | DELAYED_RELEASE_TABLET | Freq: Every day | ORAL | Status: DC
  Administered 2023-06-18 – 2023-06-20 (×3): 81 mg via ORAL
  Filled 2023-06-17 (×3): qty 1

## 2023-06-17 MED ORDER — ROSUVASTATIN CALCIUM 20 MG PO TABS
40.0000 mg | ORAL_TABLET | Freq: Every day | ORAL | Status: DC
  Administered 2023-06-18 – 2023-06-20 (×3): 40 mg via ORAL
  Filled 2023-06-17 (×3): qty 2

## 2023-06-17 MED ORDER — DONEPEZIL HCL 5 MG PO TABS
5.0000 mg | ORAL_TABLET | Freq: Every day | ORAL | Status: DC
  Administered 2023-06-17 – 2023-06-19 (×3): 5 mg via ORAL
  Filled 2023-06-17 (×3): qty 1

## 2023-06-17 MED ORDER — ACETAMINOPHEN 650 MG RE SUPP: 650.0000 mg | Freq: Four times a day (QID) | RECTAL | Status: DC | PRN

## 2023-06-17 MED ORDER — ONDANSETRON HCL 4 MG/2ML IJ SOLN: 4.0000 mg | Freq: Four times a day (QID) | INTRAMUSCULAR | Status: DC | PRN

## 2023-06-17 MED ORDER — IOHEXOL 300 MG/ML  SOLN
100.0000 mL | Freq: Once | INTRAMUSCULAR | Status: AC | PRN
  Administered 2023-06-17: 100 mL via INTRAVENOUS

## 2023-06-17 MED ORDER — ENOXAPARIN SODIUM 40 MG/0.4ML IJ SOSY
40.0000 mg | PREFILLED_SYRINGE | INTRAMUSCULAR | Status: DC
  Administered 2023-06-17 – 2023-06-19 (×3): 40 mg via SUBCUTANEOUS
  Filled 2023-06-17 (×3): qty 0.4

## 2023-06-17 MED ORDER — LACTATED RINGERS IV BOLUS
1000.0000 mL | Freq: Once | INTRAVENOUS | Status: AC
  Administered 2023-06-17: 1000 mL via INTRAVENOUS

## 2023-06-17 MED ORDER — OXYCODONE-ACETAMINOPHEN 5-325 MG PO TABS
1.0000 | ORAL_TABLET | ORAL | Status: DC | PRN
  Administered 2023-06-18 – 2023-06-20 (×5): 1 via ORAL
  Filled 2023-06-17 (×5): qty 1

## 2023-06-17 MED ORDER — LACTATED RINGERS IV SOLN: INTRAVENOUS | Status: DC

## 2023-06-17 MED ORDER — MORPHINE SULFATE (PF) 2 MG/ML IV SOLN: 2.0000 mg | INTRAVENOUS | Status: DC | PRN

## 2023-06-17 MED ORDER — ONDANSETRON HCL 4 MG PO TABS: 4.0000 mg | ORAL_TABLET | Freq: Four times a day (QID) | ORAL | Status: DC | PRN

## 2023-06-17 MED ORDER — ACETAMINOPHEN 325 MG PO TABS
650.0000 mg | ORAL_TABLET | Freq: Four times a day (QID) | ORAL | Status: DC | PRN
  Administered 2023-06-18 (×2): 650 mg via ORAL
  Filled 2023-06-17 (×3): qty 2

## 2023-06-17 MED ORDER — ESCITALOPRAM OXALATE 10 MG PO TABS
20.0000 mg | ORAL_TABLET | Freq: Every day | ORAL | Status: DC
  Administered 2023-06-18 – 2023-06-20 (×3): 20 mg via ORAL
  Filled 2023-06-17 (×4): qty 2

## 2023-06-17 NOTE — Assessment & Plan Note (Signed)
Rosuvastatin and aspirin

## 2023-06-17 NOTE — ED Provider Notes (Signed)
[facility] Provider Note    Event Date/Time   First MD Initiated Contact with Patient 06/17/23 1226     (approximate)   History   Weakness   HPI [name] is a 77 y.o. male with COPD, HTN, HLD, CAD, previous history of lung cancer presenting today for weakness.  Patient was reportedly supposed to come in for a biopsy of his lung today but never showed.  Clinic called his daughter who found him at home on the bed unable to get up.  Patient notes generalized bodyaches, shortness of breath, productive cough with white sputum, intermittent chest pain, vague abdominal pain, nausea symptoms over the past 3 days.  Minimal p.o. intake and unable to get up.  Patient able to state his name but otherwise not oriented.       Physical Exam   Triage Vital Signs: ED Triage Vitals  Encounter Vitals Group     BP 06/17/23 1226 128/72     Systolic BP Percentile --      Diastolic BP Percentile --      Pulse Rate 06/17/23 1226 (!) 57     Resp 06/17/23 1226 17     Temp 06/17/23 1226 98.4 F (36.9 C)     Temp src --      SpO2 06/17/23 1221 95 %     Weight 06/17/23 1222 127 lb 3.3 oz (57.7 kg)     Height 06/17/23 1222 5\' 8"  (1.727 m)     Head Circumference --      Peak Flow --      Pain Score 06/17/23 1222 0     Pain Loc --      Pain Education --      Exclude from Growth Chart --     Most recent vital signs: Vitals:   06/17/23 1530 06/17/23 1622  BP:    Pulse: 71   Resp:    Temp:  97.9 F (36.6 C)  SpO2: 97%    Physical Exam: I have reviewed the vital signs and nursing notes. General: Awake, alert, no acute distress.  Frail and sick appearing. Head:  Atraumatic, normocephalic.   ENT:  EOM intact, PERRL. Oral mucosa is pink and moist with no lesions. Neck: Neck is supple with full range of motion, No meningeal signs. Cardiovascular:  RRR, No murmurs. Peripheral pulses palpable and equal bilaterally. Respiratory:  Symmetrical chest wall expansion.   Rhonchi noted throughout all lung fields with mild end expiratory wheezing.  Good air movement throughout.  No use of accessory muscles.   Musculoskeletal:  No cyanosis or edema. Moving extremities with full ROM tenderness palpation throughout all extremities, back. Abdomen:  Soft, mild generalized tenderness palpation throughout abdomen, nondistended. Neuro:  GCS 15, moving all four extremities, interacting appropriately. Speech clear.  Oriented only to name and not to place or situation Psych:  Calm, appropriate.   Skin:  Warm, dry, no rash.     ED Results / Procedures / Treatments   Labs (all labs ordered are listed, but only abnormal results are displayed) Labs Reviewed  RESP PANEL BY RT-PCR (RSV, FLU A&B, COVID)  RVPGX2 - Abnormal; Notable for the following components:      Result Value   SARS Coronavirus 2 by RT PCR POSITIVE (*)    All other components within normal limits  COMPREHENSIVE METABOLIC PANEL - Abnormal; Notable for the following components:   Potassium 3.3 (*)    Glucose, Bld 116 (*)  Calcium 8.8 (*)    Albumin 3.2 (*)    All other components within normal limits  CBC WITH DIFFERENTIAL/PLATELET - Abnormal; Notable for the following components:   RBC 3.98 (*)    Hemoglobin 12.6 (*)    HCT 37.8 (*)    All other components within normal limits  LIPASE, BLOOD  URINALYSIS, ROUTINE W REFLEX MICROSCOPIC     EKG My EKG interpretation: Rate of 57, normal sinus rhythm.  Normal axis.  No acute ST elevations or depressions.   RADIOLOGY Independently interpreted chest x-ray with no acute pathology.  CT head and CT abdomen/pelvis pending at time of signout.   PROCEDURES:  Critical Care performed: No  Procedures   MEDICATIONS ORDERED IN ED: Medications  lactated ringers bolus 1,000 mL (0 mLs Intravenous Stopped 06/17/23 1612)  lactated ringers bolus 1,000 mL (1,000 mLs Intravenous New Bag/Given 06/17/23 1620)  iohexol (OMNIPAQUE) 300 MG/ML solution 100 mL (100  mLs Intravenous Contrast Given 06/17/23 1659)     IMPRESSION / MDM / ASSESSMENT AND PLAN / ED COURSE  I reviewed the triage vital signs and the nursing notes.                              Differential diagnosis includes, but is not limited to, viral URI such as COVID/flu/RSV, viral gastroenteritis, SBO, intra-abdominal infection, pneumonia, dehydration, urinary tract infection causing acute encephalopathy.  Patient's presentation is most consistent with acute presentation with potential threat to life or bodily function.  Patient is a 77 year old gentleman presenting today for acute encephalopathy, generalized bodyaches, and productive cough.  Vital signs are stable and patient was given 1 L of fluids on arrival here.  Unable to provide significant history but after collateral from daughter, patient is not at his baseline.  Actively being worked up for lung cancer outpatient which she missed an appointment today for but not currently on any therapy.  Will evaluate with CT head and CT abdomen/pelvis along with chest x-ray searching for infectious sources.  Patient was found to be positive for COVID-19 which could likely explain all of his symptoms today.  Laboratory workup otherwise reassuring at this time.  Patient was signed out pending results of CT head and CT abdomen/pelvis for further evaluation of infectious sources.  Patient will be admitted after this given ongoing encephalopathy, weakness in the setting of COVID-19.  The patient is on the cardiac monitor to evaluate for evidence of arrhythmia and/or significant heart rate changes. Clinical Course as of 06/17/23 1757  Tue Jun 17, 2023  1245 CBC with Differential(!) Unremarkable [DW]  1402 SARS Coronavirus 2 by RT PCR(!): POSITIVE [DW]  1516 Daughter - lung cancer in 2021. Been doing scans but not on any treatment. Plan for biopsy today and lymph nodes for further evaluation. He didn't show up. Has been very weak. Has been more confused  today. [DW]    Clinical Course User Index [DW] Janith Lima, MD     FINAL CLINICAL IMPRESSION(S) / ED DIAGNOSES   Final diagnoses:  COVID-19 virus infection  Acute encephalopathy     Rx / DC Orders   ED Discharge Orders     None        Note:  This document was prepared using Dragon voice recognition software and may include unintentional dictation errors.   Janith Lima, MD 06/17/23 [phone]

## 2023-06-17 NOTE — Assessment & Plan Note (Signed)
Dementia without behavioral disturbance Patient was reported to be more confused and baseline, likely related to acute illness IV hydration Delirium precautions Neurologic checks

## 2023-06-17 NOTE — ED Notes (Signed)
Pt incontinent of stool. Stool to watery for specimen collection. Pt cleansed and new brief and chuck applied. Repositioned back in bed for comfort. Denies further needs.

## 2023-06-17 NOTE — Assessment & Plan Note (Signed)
Albuterol as needed 

## 2023-06-17 NOTE — ED Notes (Signed)
Patient transported to CT 

## 2023-06-17 NOTE — ED Triage Notes (Signed)
Patient comes in from home via [facility] with complaints of weakness. According to EMS, the patient has a history of lung cancer, and was supposed to go have a lung biopsy done this morning, and didn't show up. The doctor's office called the patient's daughter, who went to check on patient and found him in bed. EMS states that the patient has been vomiting since yesterday, and it was reported that the patient hasn't eaten in 2 days. Pt is alert and oriented x2, and very lethargic when trying to answer questions at this time. Pt without any other signs of distress.

## 2023-06-17 NOTE — Assessment & Plan Note (Signed)
Patient was scheduled for biopsy of mass on 9/3 but missed appointment due to current illness

## 2023-06-17 NOTE — ED Notes (Signed)
Pt care taken, pt has soiled the bed, bed changed and warm blanket given, no other complaints.

## 2023-06-17 NOTE — H&P (Signed)
History and Physical    Patient: [name] ZOX:[id number] DOB: 1945/12/03 DOA: 06/17/2023 DOS: the patient was seen and examined on 06/17/2023 PCP: Barbette Reichmann, MD  Patient coming from: Home  Chief Complaint:  Chief Complaint  Patient presents with   Weakness    HPI: [name] is a 77 y.o. male with medical history significant for HTN, dementia, depression, hospitalized in March with multiple rib fractures from a fall as well as pneumonia, lung cancer pending biopsy which was scheduled for today, who presents to the ED with a 2-day history of vomiting, weakness, poor oral intake and mild confusion.  Patient lives alone and apparently missed his appointment for biopsy this morning, and when his daughter went to check on him she found him in bed mildly confused.  While in the ED, he developed diarrhea. ED course and data review: Upon arrival, afebrile mildly bradycardic, normotensive with normal O2 sat on room air. Labs: Normal CBC except for mild anemia of 12.6 which is his baseline.  CMP notable for potassium of 3.3.  Lipase and LFTs normal.  Respiratory viral panel positive for COVID. EKG, personally viewed and interpreted showing sinus rhythm at 57 with no acute ST-T wave changes.  CT abdomen and pelvis showing what appears to be known malignancy as follows Chest x-ray nonacute Head CT nonacute  Patient treated with 3 L LR bolus Hospitalist consulted for admission.     Past Medical History:  Diagnosis Date   Anemia    Arthritis    Back pain    scoliosis   CAD (coronary artery disease) 03/24/2011   - S/p stent to RCA in 1999  - Cath in 2019 with patent stent and mod nonobstructive disease elsewhere  - Myoview 06/05/23: EF 61, normal perfusion, low risk     COPD (chronic obstructive pulmonary disease) (HCC)    Depression    Diverticulosis    GERD (gastroesophageal reflux disease)    GI bleed    History of colon polyps    History of hiatal hernia    Hyperkalemia     Hyperlipidemia    Hypertension    Lung cancer (HCC) 06/04/2023   Radiation Rx; [facility]    Scoliosis    Tobacco abuse    Past Surgical History:  Procedure Laterality Date   APPENDECTOMY  1957   BACK SURGERY     CARDIAC CATHETERIZATION  1999   stent placement RCA   CARDIAC CATHETERIZATION  2010   mild left CA distal stenosis, mild irregularities in RCA and LAD. patent setn in RCA   COLONOSCOPY WITH PROPOFOL N/A 06/16/2018   Procedure: COLONOSCOPY WITH PROPOFOL;  Surgeon: Toledo, Boykin Nearing, MD;  Location: [facility] ENDOSCOPY;  Service: Gastroenterology;  Laterality: N/A;   CORONARY ANGIOPLASTY     CORONARY ARTERY BYPASS GRAFT     denies 09/30/19   ESOPHAGOGASTRODUODENOSCOPY (EGD) WITH PROPOFOL N/A 06/16/2018   Procedure: ESOPHAGOGASTRODUODENOSCOPY (EGD) WITH PROPOFOL;  Surgeon: Toledo, Boykin Nearing, MD;  Location: [facility] ENDOSCOPY;  Service: Gastroenterology;  Laterality: N/A;   FOOT SURGERY  1973   left   LEFT HEART CATH AND CORONARY ANGIOGRAPHY N/A 01/22/2018   Procedure: LEFT HEART CATH AND CORONARY ANGIOGRAPHY;  Surgeon: Kathleene Axiel Fjeld, MD;  Location: [facility] LAB;  Service: Cardiovascular;  Laterality: N/A;   LEFT HEART CATHETERIZATION WITH CORONARY ANGIOGRAM N/A 01/11/2015   Procedure: LEFT HEART CATHETERIZATION WITH CORONARY ANGIOGRAM;  Surgeon: Kathleene Iza Preston, MD;  Location: [facility] CATH LAB;  Service: Cardiovascular;  Laterality: N/A;   OLECRANON BURSECTOMY Right 10/04/2019   Procedure: OLECRANON BURSA;  Surgeon: Signa Kell, MD;  Location: [facility] ORS;  Service: Orthopedics;  Laterality: Right;   TRICEPS TENDON REPAIR Right 10/04/2019   Procedure: TRICEPS TENDON REPAIR AND PLECRANON BURSA EXCISION, POSSIBLE ALLOGRAFT AUGMENTATION.;  Surgeon: Signa Kell, MD;  Location: [facility] ORS;  Service: Orthopedics;  Laterality: Right;   Social History:  reports that he has been smoking cigarettes. He has a 17.5 pack-year smoking history. He has never used smokeless  tobacco. He reports current alcohol use of about 6.0 standard drinks of alcohol per week. He reports that he does not use drugs.  Allergies  Allergen Reactions   Levofloxacin Itching and Rash   Doxycycline Nausea And Vomiting    Family History  Problem Relation Age of Onset   Alzheimer's disease Father    Angina Father    Diabetes Cousin        mothers side   Heart attack Cousin        fathers side   Hyperlipidemia Cousin        fathers side   Hypertension Cousin        fathers side    Prior to Admission medications   Medication Sig Start Date End Date Taking? Authorizing Provider  aspirin EC 81 MG tablet Take 1 tablet (81 mg total) by mouth daily. 02/24/18   Robbie Lis M, PA-C  cyanocobalamin 500 MCG tablet Take 500 mcg by mouth 2 (two) times daily. VITAMIN B-12 08/18/14   [provider]  donepezil (ARICEPT) 10 MG tablet TAKE ONE-HALF TABLET BY MOUTH EVERY DAY FOR DEMENTIA 08/31/21   [provider]  escitalopram (LEXAPRO) 20 MG tablet Take 1 tablet (20 mg total) by mouth daily. 04/12/14   Kathleene Joslyn Ramos, MD  esomeprazole (NEXIUM) 40 MG capsule Take 1 capsule by mouth daily.    [provider]  folic acid (FOLVITE) 1 MG tablet Take 1 mg by mouth daily. 10/01/21   [provider]  gabapentin (NEURONTIN) 400 MG capsule Take 400 mg by mouth at bedtime.     [provider]  ibuprofen (ADVIL) 800 MG tablet Take 800 mg by mouth every 8 (eight) hours as needed for mild pain or moderate pain.    [provider]  montelukast (SINGULAIR) 10 MG tablet TAKE 1 TABLET AT BEDTIME 04/28/20   Johnna Acosta, NP  morphine (MS CONTIN) 15 MG 12 hr tablet Take 15 mg by mouth 2 (two) times daily.     [provider]  oxyCODONE-acetaminophen (PERCOCET) 5-325 MG per tablet Take 1 tablet by mouth every 4 (four) hours as needed for moderate pain. For pain    [provider]  promethazine (PHENERGAN) 25 MG tablet Take 25  mg by mouth as needed for nausea or vomiting.    [provider]  rosuvastatin (CRESTOR) 40 MG tablet Take 1 tablet (40 mg total) by mouth daily. 06/04/23   Tereso Newcomer T, PA-C  Vitamin D, Ergocalciferol, (DRISDOL) 50000 UNITS CAPS capsule Take 50,000 Units by mouth every Wednesday.  12/15/14   [provider]    Physical Exam: Vitals:   06/17/23 1530 06/17/23 1622 06/17/23 1936 06/17/23 1937  BP:   (!) 177/75   Pulse: 71  67 67  Resp:   18 18  Temp:  97.9 F (36.6 C) 98.1 F (36.7 C)   TempSrc:  Axillary    SpO2: 97%  94% 94%  Weight:  Height:       Physical Exam Vitals and nursing note reviewed.  Constitutional:      General: He is not in acute distress. HENT:     Head: Normocephalic and atraumatic.  Cardiovascular:     Rate and Rhythm: Normal rate and regular rhythm.     Heart sounds: Normal heart sounds.  Pulmonary:     Effort: Pulmonary effort is normal.     Breath sounds: Normal breath sounds.  Abdominal:     Palpations: Abdomen is soft.     Tenderness: There is no abdominal tenderness.  Neurological:     Mental Status: Mental status is at baseline.     Labs on Admission: I have personally reviewed following labs and imaging studies  CBC: Recent Labs  Lab 06/17/23 1228  WBC 5.5  NEUTROABS 4.4  HGB 12.6*  HCT 37.8*  MCV 95.0  PLT 197   Basic Metabolic Panel: Recent Labs  Lab 06/17/23 1228  NA 135  K 3.3*  CL 99  CO2 27  GLUCOSE 116*  BUN 16  CREATININE 0.68  CALCIUM 8.8*   GFR: Estimated Creatinine Clearance: 61.1 mL/min (by C-G formula based on SCr of 0.68 mg/dL). Liver Function Tests: Recent Labs  Lab 06/17/23 1228  AST 20  ALT 12  ALKPHOS 108  BILITOT 0.6  PROT 7.7  ALBUMIN 3.2*   Recent Labs  Lab 06/17/23 1228  LIPASE 30   No results for input(s): "AMMONIA" in the last 168 hours. Coagulation Profile: No results for input(s): "INR", "PROTIME" in the last 168 hours. Cardiac Enzymes: No results for  input(s): "CKTOTAL", "CKMB", "CKMBINDEX", "TROPONINI" in the last 168 hours. BNP (last 3 results) No results for input(s): "PROBNP" in the last 8760 hours. HbA1C: No results for input(s): "HGBA1C" in the last 72 hours. CBG: No results for input(s): "GLUCAP" in the last 168 hours. Lipid Profile: No results for input(s): "CHOL", "HDL", "LDLCALC", "TRIG", "CHOLHDL", "LDLDIRECT" in the last 72 hours. Thyroid Function Tests: No results for input(s): "TSH", "T4TOTAL", "FREET4", "T3FREE", "THYROIDAB" in the last 72 hours. Anemia Panel: No results for input(s): "VITAMINB12", "FOLATE", "FERRITIN", "TIBC", "IRON", "RETICCTPCT" in the last 72 hours. Urine analysis:    Component Value Date/Time   COLORURINE AMBER (A) 01/21/2023 1605   APPEARANCEUR CLOUDY (A) 01/21/2023 1605   LABSPEC 1.025 01/21/2023 1605   PHURINE 6.0 01/21/2023 1605   GLUCOSEU NEGATIVE 01/21/2023 1605   HGBUR NEGATIVE 01/21/2023 1605   BILIRUBINUR NEGATIVE 01/21/2023 1605   KETONESUR 20 (A) 01/21/2023 1605   PROTEINUR >=300 (A) 01/21/2023 1605   NITRITE NEGATIVE 01/21/2023 1605   LEUKOCYTESUR NEGATIVE 01/21/2023 1605    Radiological Exams on Admission: DG Chest Portable 1 View  Result Date: 06/17/2023 CLINICAL DATA:  Shortness of breath EXAM: PORTABLE CHEST 1 VIEW COMPARISON:  X-ray 01/21/2023 FINDINGS: Film is rotated to the left. Stable cardiopericardial silhouette. Calcified aorta. Diffuse interstitial changes to the lungs and other chronic changes. No pneumothorax or effusion. No consolidation. Overlapping cardiac leads. Curvature and degenerative changes of the spine. Osteopenia. IMPRESSION: Rotated radiograph.  Hyperinflation with chronic changes. Electronically Signed   By: Karen Kays M.D.   On: 06/17/2023 15:02     Data Reviewed: Relevant notes from primary care and specialist visits, past discharge summaries as available in EHR, including Care Everywhere. Prior diagnostic testing as pertinent to current admission  diagnoses Updated medications and problem lists for reconciliation ED course, including vitals, labs, imaging, treatment and response to treatment Triage notes, nursing and pharmacy  notes and ED provider's notes Notable results as noted in HPI   Assessment and Plan: * Gastroenteritis due to COVID-19 virus Supportive care Clear liquids if tolerated IV fluids, IV antiemetics While likely related to COVID will get GI PCR panel and C. Difficile Enteric precautions COVID precautions  Encephalopathy due to COVID-19 virus Dementia without behavioral disturbance Patient was reported to be more confused and baseline, likely related to acute illness IV hydration Delirium precautions Neurologic checks  Lung cancer [facility]) Patient was scheduled for biopsy of mass on 9/3 but missed appointment due to current illness  Chronic obstructive pulmonary disease (COPD) (HCC) Albuterol as needed  Depression Continue escitalopram  CAD (coronary artery disease) Rosuvastatin and aspirin     DVT prophylaxis: Lovenox  Consults: none  Advance Care Planning:   Code Status: Prior   Family Communication: none  Disposition Plan: Back to previous home environment  Severity of Illness: The appropriate patient status for this patient is INPATIENT. Inpatient status is judged to be reasonable and necessary in order to provide the required intensity of service to ensure the patient's safety. The patient's presenting symptoms, physical exam findings, and initial radiographic and laboratory data in the context of their chronic comorbidities is felt to place them at high risk for further clinical deterioration. Furthermore, it is not anticipated that the patient will be medically stable for discharge from the hospital within 2 midnights of admission.   * I certify that at the point of admission it is my clinical judgment that the patient will require inpatient hospital care spanning beyond 2 midnights from  the point of admission due to high intensity of service, high risk for further deterioration and high frequency of surveillance required.*  Author: Andris Baumann, MD 06/17/2023 8:10 PM  For on call review [email or web address].

## 2023-06-17 NOTE — ED Notes (Signed)
Pt pulled out IV while walking to the bathroom. Pt appeared to have an episode of diarrhea in toilet and on floor. Pt's clothing removed, and patient placed in hospital gown. New IV access obtained and patient placed in hospital brief per his request. MD Scotty Court notified of events.

## 2023-06-17 NOTE — Assessment & Plan Note (Addendum)
Supportive care Clear liquids if tolerated IV fluids, IV antiemetics While likely related to COVID will get GI PCR panel and C. Difficile Enteric precautions COVID precautions

## 2023-06-17 NOTE — Assessment & Plan Note (Signed)
Continue escitalopram 

## 2023-06-17 NOTE — ED Provider Notes (Signed)
Procedures  Clinical Course as of 06/17/23 1949  Tue Jun 17, 2023  1245 CBC with Differential(!) Unremarkable [DW]  1402 SARS Coronavirus 2 by RT PCR(!): POSITIVE [DW]  1516 Daughter - lung cancer in 2021. Been doing scans but not on any treatment. Plan for biopsy today and lymph nodes for further evaluation. He didn't show up. Has been very weak. Has been more confused today. [DW]    Clinical Course User Index [DW] Janith Lima, MD    ----------------------------------------- 7:49 PM on 06/17/2023 ----------------------------------------- CT head and chest unremarkable - reports in PACS:  ----------------------------------------- No acute intracranial abnormality.   Rounded mass like opacity posteriorly in the left lower lobe, enlarging since prior abdominal CT, similar to more recent chest CT. Appearance is concerning for malignancy.  2 mm layering bladder stone. No renal or ureteral stones. No hydronephrosis.  Sigmoid diverticulosis.  Mild bladder wall thickening may be related to chronic bladder outlet obstruction.  Aortoiliac atherosclerosis.  Emphysema.  -----------------------------------------  Will contact hospitalist  Final diagnoses:  COVID-19 virus infection  Acute encephalopathy        Sharman Cheek, MD 06/17/23 [phone]

## 2023-06-17 NOTE — ED Notes (Signed)
Pt with diarrhea. Bedding, brief and gown changed.

## 2023-06-18 ENCOUNTER — Encounter: Payer: Self-pay | Admitting: Internal Medicine

## 2023-06-18 DIAGNOSIS — A0839 Other viral enteritis: Secondary | ICD-10-CM

## 2023-06-18 DIAGNOSIS — F0390 Unspecified dementia without behavioral disturbance: Secondary | ICD-10-CM | POA: Diagnosis not present

## 2023-06-18 DIAGNOSIS — G9349 Other encephalopathy: Secondary | ICD-10-CM | POA: Diagnosis not present

## 2023-06-18 DIAGNOSIS — U071 COVID-19: Secondary | ICD-10-CM

## 2023-06-18 LAB — CLOSTRIDIUM DIFFICILE BY PCR, REFLEXED: Toxigenic C. Difficile by PCR: NEGATIVE

## 2023-06-18 LAB — URINALYSIS, ROUTINE W REFLEX MICROSCOPIC
Bilirubin Urine: NEGATIVE
Glucose, UA: NEGATIVE mg/dL
Hgb urine dipstick: NEGATIVE
Ketones, ur: 20 mg/dL — AB
Leukocytes,Ua: NEGATIVE
Nitrite: NEGATIVE
Protein, ur: 100 mg/dL — AB
Specific Gravity, Urine: 1.033 — ABNORMAL HIGH (ref 1.005–1.030)
Squamous Epithelial / HPF: NONE SEEN /HPF (ref 0–5)
pH: 6 (ref 5.0–8.0)

## 2023-06-18 LAB — BASIC METABOLIC PANEL WITH GFR
Anion gap: 11 (ref 5–15)
BUN: 15 mg/dL (ref 8–23)
CO2: 21 mmol/L — ABNORMAL LOW (ref 22–32)
Calcium: 8.8 mg/dL — ABNORMAL LOW (ref 8.9–10.3)
Chloride: 102 mmol/L (ref 98–111)
Creatinine, Ser: 0.58 mg/dL — ABNORMAL LOW (ref 0.61–1.24)
GFR, Estimated: >60 mL/min
Glucose, Bld: 106 mg/dL — ABNORMAL HIGH (ref 70–99)
Potassium: 2.5 mmol/L — CL (ref 3.5–5.1)
Sodium: 134 mmol/L — ABNORMAL LOW (ref 135–145)

## 2023-06-18 LAB — C DIFFICILE QUICK SCREEN W PCR REFLEX
C Diff antigen: POSITIVE — AB
C Diff toxin: NEGATIVE

## 2023-06-18 LAB — CBC
HCT: 35 % — ABNORMAL LOW (ref 39.0–52.0)
Hemoglobin: 12 g/dL — ABNORMAL LOW (ref 13.0–17.0)
MCH: 31.7 pg (ref 26.0–34.0)
MCHC: 34.3 g/dL (ref 30.0–36.0)
MCV: 92.6 fL (ref 80.0–100.0)
Platelets: 210 10*3/uL (ref 150–400)
RBC: 3.78 MIL/uL — ABNORMAL LOW (ref 4.22–5.81)
RDW: 13.4 % (ref 11.5–15.5)
WBC: 6.5 10*3/uL (ref 4.0–10.5)
nRBC: 0 % (ref 0.0–0.2)

## 2023-06-18 LAB — GASTROINTESTINAL PANEL BY PCR, STOOL (REPLACES STOOL CULTURE)

## 2023-06-18 LAB — POTASSIUM: Potassium: 3.4 mmol/L — ABNORMAL LOW (ref 3.5–5.1)

## 2023-06-18 LAB — MAGNESIUM: Magnesium: 2 mg/dL (ref 1.7–2.4)

## 2023-06-18 MED ORDER — LOPERAMIDE HCL 2 MG PO CAPS
4.0000 mg | ORAL_CAPSULE | ORAL | Status: DC | PRN
  Administered 2023-06-18 – 2023-06-19 (×2): 4 mg via ORAL
  Filled 2023-06-18 (×2): qty 2

## 2023-06-18 MED ORDER — PANCRELIPASE (LIP-PROT-AMYL) 12000-38000 UNITS PO CPEP
24000.0000 [IU] | ORAL_CAPSULE | Freq: Three times a day (TID) | ORAL | Status: DC
  Administered 2023-06-18 – 2023-06-20 (×5): 24000 [IU] via ORAL
  Filled 2023-06-18 (×6): qty 2

## 2023-06-18 MED ORDER — POTASSIUM CHLORIDE 10 MEQ/100ML IV SOLN
10.0000 meq | INTRAVENOUS | Status: AC
  Administered 2023-06-18 (×2): 10 meq via INTRAVENOUS
  Filled 2023-06-18 (×2): qty 100

## 2023-06-18 MED ORDER — POTASSIUM CHLORIDE IN NACL 20-0.9 MEQ/L-% IV SOLN
INTRAVENOUS | Status: DC
  Filled 2023-06-18 (×4): qty 1000

## 2023-06-18 MED ORDER — ALBUTEROL SULFATE HFA 108 (90 BASE) MCG/ACT IN AERS
2.0000 | INHALATION_SPRAY | Freq: Four times a day (QID) | RESPIRATORY_TRACT | Status: DC
  Administered 2023-06-18 – 2023-06-20 (×8): 2 via RESPIRATORY_TRACT
  Filled 2023-06-18: qty 6.7

## 2023-06-18 MED ORDER — POTASSIUM CHLORIDE CRYS ER 20 MEQ PO TBCR
40.0000 meq | EXTENDED_RELEASE_TABLET | ORAL | Status: AC
  Administered 2023-06-18 (×2): 40 meq via ORAL
  Filled 2023-06-18 (×2): qty 2

## 2023-06-18 NOTE — Hospital Course (Signed)
[name] is a 77 y.o. male with medical history significant for HTN, dementia, depression, hospitalized in March with multiple rib fractures from a fall as well as pneumonia, lung cancer pending biopsy which was scheduled for today, who presents to the ED with a 2-day history of vomiting, weakness, poor oral intake and mild confusion.  He was found to have severe hypokalemia, is giving IV fluids and potassium. Condition had improved, no longer has any confusion.  Potassium normalized.  Phosphate is low at 1.4, will give IV infusion.  Patient is medically stable for discharge after infusion of phosphate.

## 2023-06-18 NOTE — ED Notes (Signed)
Pt cleaned up x1 liquid diarrhea, all soaked into brief unable to collect a stool sample at this time

## 2023-06-18 NOTE — ED Notes (Signed)
Hospitalist at bedside 

## 2023-06-18 NOTE — Progress Notes (Signed)
  Progress Note   Patient: [name] EXB:[id number] DOB: 09-29-1946 DOA: 06/17/2023     1 DOS: the patient was seen and examined on 06/18/2023   Brief hospital course: [name] is a 77 y.o. male with medical history significant for HTN, dementia, depression, hospitalized in March with multiple rib fractures from a fall as well as pneumonia, lung cancer pending biopsy which was scheduled for today, who presents to the ED with a 2-day history of vomiting, weakness, poor oral intake and mild confusion.  He was found to have severe hypokalemia, is giving IV fluids and potassium.   Principal Problem:   Gastroenteritis due to COVID-19 virus Active Problems:   Encephalopathy due to COVID-19 virus   Dementia without behavioral disturbance (HCC)   Chronic obstructive pulmonary disease (COPD) (HCC)   Lung cancer (HCC)   CAD (coronary artery disease)   Depression   Assessment and Plan:  * Gastroenteritis due to COVID-19 virus Severe hypokalemia secondary to nausea vomiting. Continue supportive care, continue IV fluids to prevent dehydration.  Patient was given large amount of potassium, recheck levels tomorrow.     Acute metabolic encephalopathy due to COVID-19 virus Dementia without behavioral disturbance Mental status appears to be better today.  Continue to follow.  Dysphagia. Not able to clear upper airway, will place on dysphagia 3 diet, speech therapy evaluation obtained.  Lung cancer [facility]) Patient was scheduled for biopsy of mass on 9/3 but missed appointment due to current illness Needed to reschedule biopsy.  Chronic obstructive pulmonary disease (COPD) (HCC) Albuterol as needed  Depression Continue escitalopram  CAD (coronary artery disease) Rosuvastatin and aspirin      Subjective:  Patient doing better today, less confused.  Still has some diarrhea. Patient also appeared to have significant dysphagia, not able to clear upper airway.  Physical  Exam: Vitals:   06/18/23 0500 06/18/23 0600 06/18/23 0800 06/18/23 1029  BP: (!) 164/79 (!) 166/71 (!) 158/58 (!) 155/67  Pulse: 66 63 62 60  Resp: 17 17  20   Temp:    97.7 F (36.5 C)  TempSrc:      SpO2: 97% 98% 96% 98%  Weight:      Height:       General exam: Appears calm and comfortable  Respiratory system: Clear to auscultation. Respiratory effort normal. Cardiovascular system: S1 & S2 heard, RRR. No JVD, murmurs, rubs, gallops or clicks. No pedal edema. Gastrointestinal system: Abdomen is nondistended, soft and nontender. No organomegaly or masses felt. Normal bowel sounds heard. Central nervous system: Alert and oriented x2. No focal neurological deficits. Extremities: Symmetric 5 x 5 power. Skin: No rashes, lesions or ulcers Psychiatry: Mood & affect appropriate.    Data Reviewed:  X-rays, lab results reviewed.  Family Communication: Daughter updated.  Disposition: Status is: Inpatient Remains inpatient appropriate because: Part of disease, IV treatment.     Time spent: 50 minutes  Author: Marrion Coy, MD 06/18/2023 3:15 PM  For on call review [email or web address].

## 2023-06-18 NOTE — ED Notes (Signed)
Hospitalist messaged due to pt critical potassium, no new orders at this time

## 2023-06-19 DIAGNOSIS — U071 COVID-19: Secondary | ICD-10-CM | POA: Diagnosis not present

## 2023-06-19 DIAGNOSIS — F0390 Unspecified dementia without behavioral disturbance: Secondary | ICD-10-CM | POA: Diagnosis not present

## 2023-06-19 DIAGNOSIS — G9349 Other encephalopathy: Secondary | ICD-10-CM | POA: Diagnosis not present

## 2023-06-19 DIAGNOSIS — A0839 Other viral enteritis: Secondary | ICD-10-CM | POA: Diagnosis not present

## 2023-06-19 LAB — BASIC METABOLIC PANEL
Anion gap: 8 (ref 5–15)
BUN: 15 mg/dL (ref 8–23)
CO2: 21 mmol/L — ABNORMAL LOW (ref 22–32)
Calcium: 9.1 mg/dL (ref 8.9–10.3)
Chloride: 108 mmol/L (ref 98–111)
Creatinine, Ser: 0.55 mg/dL — ABNORMAL LOW (ref 0.61–1.24)
GFR, Estimated: >60 mL/min (ref >60)
Glucose, Bld: 120 mg/dL — ABNORMAL HIGH (ref 70–99)
Potassium: 4.1 mmol/L (ref 3.5–5.1)
Sodium: 137 mmol/L (ref 135–145)

## 2023-06-19 LAB — MAGNESIUM: Magnesium: 2.1 mg/dL (ref 1.7–2.4)

## 2023-06-19 LAB — CBC
HCT: 34.8 % — ABNORMAL LOW (ref 39.0–52.0)
Hemoglobin: 11.8 g/dL — ABNORMAL LOW (ref 13.0–17.0)
MCH: 31.9 pg (ref 26.0–34.0)
MCHC: 33.9 g/dL (ref 30.0–36.0)
MCV: 94.1 fL (ref 80.0–100.0)
Platelets: 215 10*3/uL (ref 150–400)
RBC: 3.7 MIL/uL — ABNORMAL LOW (ref 4.22–5.81)
RDW: 13.8 % (ref 11.5–15.5)
WBC: 7.8 10*3/uL (ref 4.0–10.5)
nRBC: 0 % (ref 0.0–0.2)

## 2023-06-19 LAB — PHOSPHORUS: Phosphorus: 1.4 mg/dL — ABNORMAL LOW (ref 2.5–4.6)

## 2023-06-19 MED ORDER — ENSURE ENLIVE PO LIQD
237.0000 mL | Freq: Two times a day (BID) | ORAL | Status: DC
  Administered 2023-06-20: 237 mL via ORAL

## 2023-06-19 MED ORDER — MORPHINE SULFATE ER 15 MG PO TBCR: 15.0000 mg | EXTENDED_RELEASE_TABLET | Freq: Two times a day (BID) | ORAL | Status: DC

## 2023-06-19 MED ORDER — POTASSIUM & SODIUM PHOSPHATES 280-160-250 MG PO PACK: 1.0000 | PACK | Freq: Three times a day (TID) | ORAL | 0 refills | Status: DC

## 2023-06-19 MED ORDER — SODIUM BICARBONATE 650 MG PO TABS
650.0000 mg | ORAL_TABLET | Freq: Two times a day (BID) | ORAL | Status: DC
  Administered 2023-06-19: 650 mg via ORAL
  Filled 2023-06-19: qty 1

## 2023-06-19 MED ORDER — SODIUM PHOSPHATES 45 MMOLE/15ML IV SOLN
30.0000 mmol | Freq: Once | INTRAVENOUS | Status: AC
  Administered 2023-06-19: 30 mmol via INTRAVENOUS
  Filled 2023-06-19: qty 10

## 2023-06-19 MED ORDER — PANCRELIPASE (LIP-PROT-AMYL) 24000-76000 UNITS PO CPEP: 24000.0000 [IU] | ORAL_CAPSULE | Freq: Three times a day (TID) | ORAL | 0 refills | Status: DC

## 2023-06-19 MED ORDER — MORPHINE SULFATE ER 15 MG PO TBCR
15.0000 mg | EXTENDED_RELEASE_TABLET | Freq: Two times a day (BID) | ORAL | Status: DC
  Administered 2023-06-19 – 2023-06-20 (×2): 15 mg via ORAL
  Filled 2023-06-19 (×2): qty 1

## 2023-06-19 MED ORDER — QUETIAPINE FUMARATE 25 MG PO TABS
25.0000 mg | ORAL_TABLET | Freq: Every day | ORAL | Status: DC
  Administered 2023-06-19: 25 mg via ORAL
  Filled 2023-06-19: qty 1

## 2023-06-19 NOTE — Evaluation (Signed)
Clinical/Bedside Swallow Evaluation Patient Details  Name: [name] MRN: [id number] Date of Birth: May 23, 1946  Today's Date: 06/19/2023 Time: SLP Start Time (ACUTE ONLY): 1234 SLP Stop Time (ACUTE ONLY): 1308 SLP Time Calculation (min) (ACUTE ONLY): 34 min  Past Medical History:  Past Medical History:  Diagnosis Date   Anemia    Arthritis    Back pain    scoliosis   CAD (coronary artery disease) 03/24/2011   - S/p stent to RCA in 1999  - Cath in 2019 with patent stent and mod nonobstructive disease elsewhere  - Myoview 06/05/23: EF 61, normal perfusion, low risk     COPD (chronic obstructive pulmonary disease) (HCC)    Depression    Diverticulosis    GERD (gastroesophageal reflux disease)    GI bleed    History of colon polyps    History of hiatal hernia    Hyperkalemia    Hyperlipidemia    Hypertension    Lung cancer (HCC) 06/04/2023   Radiation Rx; [facility]    Scoliosis    Tobacco abuse    Past Surgical History:  Past Surgical History:  Procedure Laterality Date   APPENDECTOMY  1957   BACK SURGERY     CARDIAC CATHETERIZATION  1999   stent placement RCA   CARDIAC CATHETERIZATION  2010   mild left CA distal stenosis, mild irregularities in RCA and LAD. patent setn in RCA   COLONOSCOPY WITH PROPOFOL N/A 06/16/2018   Procedure: COLONOSCOPY WITH PROPOFOL;  Surgeon: Toledo, Boykin Nearing, MD;  Location: [facility] ENDOSCOPY;  Service: Gastroenterology;  Laterality: N/A;   CORONARY ANGIOPLASTY     CORONARY ARTERY BYPASS GRAFT     denies 09/30/19   ESOPHAGOGASTRODUODENOSCOPY (EGD) WITH PROPOFOL N/A 06/16/2018   Procedure: ESOPHAGOGASTRODUODENOSCOPY (EGD) WITH PROPOFOL;  Surgeon: Toledo, Boykin Nearing, MD;  Location: [facility] ENDOSCOPY;  Service: Gastroenterology;  Laterality: N/A;   FOOT SURGERY  1973   left   LEFT HEART CATH AND CORONARY ANGIOGRAPHY N/A 01/22/2018   Procedure: LEFT HEART CATH AND CORONARY ANGIOGRAPHY;  Surgeon: Kathleene Hazel, MD;  [facility]: [facility] INVASIVE  CV LAB;  Service: Cardiovascular;  Laterality: N/A;   LEFT HEART CATHETERIZATION WITH CORONARY ANGIOGRAM N/A 01/11/2015   Procedure: LEFT HEART CATHETERIZATION WITH CORONARY ANGIOGRAM;  Surgeon: Kathleene Hazel, MD;  Location: [facility] CATH LAB;  Service: Cardiovascular;  Laterality: N/A;   OLECRANON BURSECTOMY Right 10/04/2019   Procedure: OLECRANON BURSA;  Surgeon: Signa Kell, MD;  Location: [facility] ORS;  Service: Orthopedics;  Laterality: Right;   TRICEPS TENDON REPAIR Right 10/04/2019   Procedure: TRICEPS TENDON REPAIR AND PLECRANON BURSA EXCISION, POSSIBLE ALLOGRAFT AUGMENTATION.;  Surgeon: Signa Kell, MD;  Location: [facility] ORS;  Service: Orthopedics;  Laterality: Right;   HPI:  77yo male admitted 06/17/23 with vomiting, poor PO intake, mild confusion, weakness. PMH: GERD, COPD, GI bleed, hiatal hernia, HTN, HLD, dementia, depression, falls with multiple rib fx, PNA, lung cancer pending biopsy. CTChest = Rounded masslike opacity noted posteriorly LLL, emphysema. CTHead = No acute intracranial abnormality.    Assessment / Plan / Recommendation  Clinical Impression  Pt presents with functional oropharyngeal swallow as best can be determined at bedside. Pt was alert and cooperative, resting in bed. CN exam WFL. Volitional cough congested and nonproductive. Pt reports no difficulty with swallowing, and has been tolerating a soft diet with thin liquids prior to admit. He is edentulous. Pt also reports poor appetite since admit. He indicated he would try magic cup.   Pt  accepted trials of thin liquid, puree, and soft solid textures. No obvious oral issues, and no overt s/s aspiration observed following any consistency. Pt passed 3oz water challenge, indicating low probability of aspiration. He is at increased risk for aspiration, however, due to dx COPD and GERD. Recommend continuing with dys 3 (soft) solids and thin liquids, meds as tolerated. Please reconsult if needs arise.   SLP assisted pt to [facility]  and return to bed safely.  SLP Visit Diagnosis: Dysphagia, unspecified (R13.10)    Aspiration Risk  Moderate aspiration risk    Diet Recommendation Dysphagia 3 (Mech soft);Thin liquid    Liquid Administration via: Cup;Straw Medication Administration: Other (Comment) (as tolerated) Supervision: Patient able to self feed Compensations: Minimize environmental distractions;Slow rate;Small sips/bites Postural Changes: Remain upright for at least 30 minutes after po intake;Seated upright at 90 degrees    Other  Recommendations Oral Care Recommendations: Oral care BID    Recommendations for follow up therapy are one component of a multi-disciplinary discharge planning process, led by the attending physician.  Recommendations may be updated based on patient status, additional functional criteria and insurance authorization.  Follow up Recommendations Follow physician's recommendations for discharge plan and follow up therapies      Functional Status Assessment Patient has not had a recent decline in their functional status      Prognosis Prognosis for improved oropharyngeal function: Good      Swallow Study   General Date of Onset: 06/17/23 HPI: 77yo male admitted 06/17/23 with vomiting, poor PO intake, mild confusion, weakness. PMH: GERD, COPD, GI bleed, hiatal hernia, HTN, HLD, dementia, depression, falls with multiple rib fx, PNA, lung cancer pending biopsy. CTChest = Rounded masslike opacity noted posteriorly LLL, emphysema. CTHead = No acute intracranial abnormality. Type of Study: Bedside Swallow Evaluation Previous Swallow Assessment: BSE 12/2019 = Dys3, minced meats, gravy, meds whole with puree. Diet Prior to this Study: Dysphagia 3 (mechanical soft);Thin liquids (Level 0) Temperature Spikes Noted: No Respiratory Status: Room air History of Recent Intubation: No Behavior/Cognition: Alert;Cooperative;Pleasant mood Oral Cavity Assessment: Within Functional Limits Oral Care  Completed by SLP: No Oral Cavity - Dentition: Edentulous Vision: Functional for self-feeding Self-Feeding Abilities: Able to feed self Patient Positioning: Upright in bed Baseline Vocal Quality: Normal Volitional Cough: Congested Volitional Swallow: Able to elicit    Oral/Motor/Sensory Function Overall Oral Motor/Sensory Function: Within functional limits   Ice Chips Ice chips: Not tested   Thin Liquid Thin Liquid: Within functional limits Presentation: Straw;Self Fed    Nectar Thick Nectar Thick Liquid: Not tested   Honey Thick Honey Thick Liquid: Not tested   Puree Puree: Within functional limits Presentation: Spoon   Solid     Solid: Within functional limits Presentation: Spoon     Charmon Thorson B. Murvin Natal, [facility], CCC-SLP Speech Language Pathologist  [place] [place] 06/19/2023,1:52 PM

## 2023-06-19 NOTE — Discharge Summary (Signed)
Physician Discharge Summary   Patient: [name] MRN: [id number] DOB: 11/27/1945  Admit date:     06/17/2023  Discharge date: 06/19/23  Discharge Physician: Marrion Coy   PCP: Barbette Reichmann, MD   Recommendations at discharge:   Follow-up with PCP in 1 week.  Discharge Diagnoses: Principal Problem:   Gastroenteritis due to COVID-19 virus Active Problems:   Encephalopathy due to COVID-19 virus   Dementia without behavioral disturbance (HCC)   Chronic obstructive pulmonary disease (COPD) (HCC)   Lung cancer (HCC)   CAD (coronary artery disease)   Depression   Hypophosphatemia Moderate protein calorie malnutrition. Resolved Problems:   * No resolved hospital problems. [facility] Course: [name] is a 77 y.o. male with medical history significant for HTN, dementia, depression, hospitalized in March with multiple rib fractures from a fall as well as pneumonia, lung cancer pending biopsy which was scheduled for today, who presents to the ED with a 2-day history of vomiting, weakness, poor oral intake and mild confusion.  He was found to have severe hypokalemia, is giving IV fluids and potassium. Condition had improved, no longer has any confusion.  Potassium normalized.  Phosphate is low at 1.4, will give IV infusion.  Patient is medically stable for discharge after infusion of phosphate.  Assessment and Plan:  * Gastroenteritis due to COVID-19 virus Severe hypokalemia secondary to nausea vomiting.  Improved. Hypophosphatemia. Mild hyponatremia resolved. Minimal metabolic acidosis. Patient was given IV fluids, potassium.  Condition had improved.  Potassium normalized.  Patient phosphate was 1.4, will give 30 mmol of sodium phosphate before discharge.  I will also continue oral Neutra-Phos for 1 day. Patient also has some chronic diarrhea, consider possibility of malabsorption.  Creon was given.  Diarrhea much better today. Currently, patient is medically stable for  discharge.     Acute metabolic encephalopathy due to COVID-19 virus Dementia without behavioral disturbance Condition had improved.   Dysphagia. Patient initially had increased airway secretion, but that had cleared up today.  May need to follow-up with PCP as outpatient.   Lung cancer [facility]) Patient was scheduled for biopsy of mass on 9/3 but missed appointment due to current illness Need to reschedule biopsy.  Patient and daughter aware.   Chronic obstructive pulmonary disease (COPD) (HCC) Resume home medicines.   Depression Continue escitalopram   CAD (coronary artery disease) Resume home treatment.      Consultants: None Procedures performed: None  Disposition: Home Diet recommendation:  Discharge Diet Orders (From admission, onward)     Start     Ordered   06/19/23 0000  Diet - low sodium heart healthy        06/19/23 1047           Cardiac diet DISCHARGE MEDICATION: Allergies as of 06/19/2023       Reactions   Levofloxacin Itching, Rash   Doxycycline Nausea And Vomiting        Medication List     STOP taking these medications    ibuprofen 800 MG tablet Commonly known as: ADVIL   morphine 15 MG 12 hr tablet Commonly known as: MS CONTIN       TAKE these medications    aspirin EC 81 MG tablet Take 1 tablet (81 mg total) by mouth daily.   cyanocobalamin 500 MCG tablet Commonly known as: VITAMIN B12 Take 500 mcg by mouth 2 (two) times daily. VITAMIN B-12   donepezil 10 MG tablet Commonly known as: ARICEPT TAKE ONE-HALF TABLET BY  MOUTH EVERY DAY FOR DEMENTIA   escitalopram 20 MG tablet Commonly known as: LEXAPRO Take 1 tablet (20 mg total) by mouth daily.   folic acid 1 MG tablet Commonly known as: FOLVITE Take 1 mg by mouth daily.   gabapentin 400 MG capsule Commonly known as: NEURONTIN Take 400 mg by mouth at bedtime.   montelukast 10 MG tablet Commonly known as: SINGULAIR TAKE 1 TABLET AT BEDTIME   oxyCODONE-acetaminophen  5-325 MG tablet Commonly known as: PERCOCET/ROXICET Take 1 tablet by mouth every 4 (four) hours as needed for moderate pain. For pain   Pancrelipase (Lip-Prot-Amyl) 24000-76000 units Cpep Take 1 capsule (24,000 Units total) by mouth 3 (three) times daily before meals for 14 days.   potassium & sodium phosphates 280-160-250 MG Pack Commonly known as: PHOS-NAK Take 1 packet by mouth 4 (four) times daily -  with meals and at bedtime.   rosuvastatin 40 MG tablet Commonly known as: CRESTOR Take 1 tablet (40 mg total) by mouth daily.   Vitamin D (Ergocalciferol) 1.25 MG (50000 UNIT) Caps capsule Commonly known as: DRISDOL Take 50,000 Units by mouth every Wednesday.        Follow-up Information     Barbette Reichmann, MD Follow up in 1 week(s).   Specialty: Internal Medicine Contact information: [facility] [address] [place] [address] [phone]                Discharge Exam: Ceasar Mons Weights   06/17/23 1222  Weight: 57.7 kg   General exam: Appears calm and comfortable, moderately malnourished. Respiratory system: Significant decreased breathing sounds. Respiratory effort normal. Cardiovascular system: S1 & S2 heard, RRR. No JVD, murmurs, rubs, gallops or clicks. No pedal edema. Gastrointestinal system: Abdomen is nondistended, soft and nontender. No organomegaly or masses felt. Normal bowel sounds heard. Central nervous system: Alert and oriented. No focal neurological deficits. Extremities: Symmetric 5 x 5 power. Skin: No rashes, lesions or ulcers Psychiatry: Judgement and insight appear normal. Mood & affect appropriate.    Condition at discharge: fair  The results of significant diagnostics from this hospitalization (including imaging, microbiology, ancillary and laboratory) are listed below for reference.   Imaging Studies: CT Head Wo Contrast  Result Date: 06/17/2023 CLINICAL DATA:  acute encephalopathy.  Weakness. EXAM:  CT HEAD WITHOUT CONTRAST TECHNIQUE: Contiguous axial images were obtained from the base of the skull through the vertex without intravenous contrast. RADIATION DOSE REDUCTION: This exam was performed according to the departmental dose-optimization program which includes automated exposure control, adjustment of the mA and/or kV according to patient size and/or use of iterative reconstruction technique. COMPARISON:  01/21/2023 FINDINGS: Brain: Mild age related cerebral atrophy. No acute intracranial abnormality. Specifically, no hemorrhage, hydrocephalus, mass lesion, acute infarction, or significant intracranial injury. Vascular: No hyperdense vessel or unexpected calcification. Skull: No acute calvarial abnormality. Sinuses/Orbits: No acute findings Other: None IMPRESSION: No acute intracranial abnormality. Electronically Signed   By: Charlett Nose M.D.   On: 06/17/2023 19:09   CT ABDOMEN PELVIS W CONTRAST  Result Date: 06/17/2023 CLINICAL DATA:  Generalized abdominal pain EXAM: CT ABDOMEN AND PELVIS WITH CONTRAST TECHNIQUE: Multidetector CT imaging of the abdomen and pelvis was performed using the standard protocol following bolus administration of intravenous contrast. RADIATION DOSE REDUCTION: This exam was performed according to the departmental dose-optimization program which includes automated exposure control, adjustment of the mA and/or kV according to patient size and/or use of iterative reconstruction technique. CONTRAST:  OMNIPAQUE IOHEXOL 300 MG/ML  SOLN COMPARISON:  03/06/2022.  Chest CT 01/02/2023. FINDINGS: Lower chest: Rounded masslike opacity noted posteriorly in the left lower lobe measuring 4.7 x 2.9 cm. This was noted on prior abdominal CT over 1 year ago, measuring up to 1.9 cm at that time. This was also noted as enlarged and cavitary on more recent chest CT. Appearance and persistence is concerning for lung cancer. Emphysema. No effusions. Hepatobiliary: No focal hepatic abnormality.  Gallbladder unremarkable. Pancreas: No focal abnormality or ductal dilatation. Spleen: Spleen upper limits normal in size at 12.9 cm. No focal abnormality. Adrenals/Urinary Tract: No adrenal abnormality. No focal renal abnormality. No stones or hydronephrosis. Urinary bladder appears mildly thick walled, possibly rib related to bladder outlet obstruction. 2 mm layering bladder stone. Stomach/Bowel: Sigmoid diverticulosis. No active diverticulitis. Stomach and small bowel decompressed, unremarkable. Vascular/Lymphatic: Aortoiliac atherosclerosis. No evidence of aneurysm or adenopathy. Reproductive: No visible focal abnormality. Other: No free fluid or free air. Musculoskeletal: Postoperative and degenerative changes. No acute bony abnormality. IMPRESSION: Rounded mass like opacity posteriorly in the left lower lobe, enlarging since prior abdominal CT, similar to more recent chest CT. Appearance is concerning for malignancy. 2 mm layering bladder stone. No renal or ureteral stones. No hydronephrosis. Sigmoid diverticulosis. Mild bladder wall thickening may be related to chronic bladder outlet obstruction. Aortoiliac atherosclerosis. Emphysema. Electronically Signed   By: Charlett Nose M.D.   On: 06/17/2023 19:08   DG Chest Portable 1 View  Result Date: 06/17/2023 CLINICAL DATA:  Shortness of breath EXAM: PORTABLE CHEST 1 VIEW COMPARISON:  X-ray 01/21/2023 FINDINGS: Film is rotated to the left. Stable cardiopericardial silhouette. Calcified aorta. Diffuse interstitial changes to the lungs and other chronic changes. No pneumothorax or effusion. No consolidation. Overlapping cardiac leads. Curvature and degenerative changes of the spine. Osteopenia. IMPRESSION: Rotated radiograph.  Hyperinflation with chronic changes. Electronically Signed   By: Karen Kays M.D.   On: 06/17/2023 15:02   ECHOCARDIOGRAM COMPLETE  Result Date: 06/09/2023    ECHOCARDIOGRAM REPORT   Patient Name:   DAQUARIUS FITZSIMMONS Date of Exam:  06/09/2023 Medical Rec #:  [id number]      Height:       68.0 in Accession #:    [id number]     Weight:       127.2 lb Date of Birth:  1946/09/09       BSA:          1.686 m Patient Age:    77 years       BP:           116/58 mmHg Patient Gender: M              HR:           69 bpm. Exam Location:  [address] Procedure: 2D Echo, 3D Echo, Cardiac Doppler, Color Doppler and Strain Analysis Indications:     R06.02 SOB  History:         Patient has no prior history of Echocardiogram examinations.                  CAD, Status post stent RCA, COPD and Lung cancer status post                  radiotherapy, Signs/Symptoms:Shortness of Breath; Risk                  Factors:Hypertension.  Sonographer:     Chanetta Marshall BA, RDCS Referring Phys:  Evern Bio WEAVER Diagnosing Phys: Zoila Shutter  MD IMPRESSIONS  1. Left ventricular ejection fraction, by estimation, is 60 to 65%. The left ventricle has normal function. The left ventricle has no regional wall motion abnormalities. Left ventricular diastolic parameters are consistent with Grade I diastolic dysfunction (impaired relaxation).  2. Right ventricular systolic function is normal. The right ventricular size is normal. There is mildly elevated pulmonary artery systolic pressure. The estimated right ventricular systolic pressure is 37.3 mmHg.  3. The mitral valve is abnormal. Trivial mitral valve regurgitation.  4. The posterior leaflet appears thickened. The tricuspid valve is abnormal.  5. The non-coronary cusp appears calcified and immobile. The aortic valve is tricuspid. Aortic valve regurgitation is not visualized. Aortic valve sclerosis/calcification is present, without any evidence of aortic stenosis.  6. The inferior vena cava is normal in size with greater than 50% respiratory variability, suggesting right atrial pressure of 3 mmHg. Comparison(s): No prior Echocardiogram. FINDINGS  Left Ventricle: Left ventricular ejection fraction, by estimation, is 60 to 65%. The  left ventricle has normal function. The left ventricle has no regional wall motion abnormalities. Global longitudinal strain performed but not reported based on interpreter judgement due to suboptimal tracking. 3D ejection fraction reviewed and evaluated as part of the interpretation. Alternate measurement of EF is felt to be most reflective of LV function. The left ventricular internal cavity size was normal in  size. There is no left ventricular hypertrophy. Left ventricular diastolic parameters are consistent with Grade I diastolic dysfunction (impaired relaxation). Indeterminate filling pressures. Right Ventricle: The right ventricular size is normal. No increase in right ventricular wall thickness. Right ventricular systolic function is normal. There is mildly elevated pulmonary artery systolic pressure. The tricuspid regurgitant velocity is 2.93  m/s, and with an assumed right atrial pressure of 3 mmHg, the estimated right ventricular systolic pressure is 37.3 mmHg. Left Atrium: Left atrial size was normal in size. Right Atrium: Right atrial size was normal in size. Pericardium: There is no evidence of pericardial effusion. Mitral Valve: The mitral valve is abnormal. Mild mitral annular calcification. Trivial mitral valve regurgitation. Tricuspid Valve: The posterior leaflet appears thickened. The tricuspid valve is abnormal. Tricuspid valve regurgitation is trivial. Aortic Valve: The non-coronary cusp appears calcified and immobile. The aortic valve is tricuspid. Aortic valve regurgitation is not visualized. Aortic valve sclerosis/calcification is present, without any evidence of aortic stenosis. Pulmonic Valve: The pulmonic valve was grossly normal. Pulmonic valve regurgitation is trivial. Aorta: The aortic root and ascending aorta are structurally normal, with no evidence of dilitation. Venous: The inferior vena cava is normal in size with greater than 50% respiratory variability, suggesting right atrial  pressure of 3 mmHg. IAS/Shunts: No atrial level shunt detected by color flow Doppler.  LEFT VENTRICLE PLAX 2D LVIDd:         3.90 cm   Diastology LVIDs:         2.20 cm   LV e' medial:    7.83 cm/s LV PW:         0.90 cm   LV E/e' medial:  10.8 LV IVS:        1.00 cm   LV e' lateral:   11.90 cm/s LVOT diam:     2.10 cm   LV E/e' lateral: 7.1 LV SV:         76 LV SV Index:   45        2D Longitudinal Strain LVOT Area:     3.46 cm  2D Strain GLS (A2C):   -16.2 %  2D Strain GLS (A3C):   -17.2 %                          2D Strain GLS (A4C):   -20.2 %                          2D Strain GLS Avg:     -17.8 %                           3D Volume EF:                          3D EF:        57 %                          LV EDV:       115 ml                          LV ESV:       50 ml                          LV SV:        65 ml RIGHT VENTRICLE             IVC RV Basal diam:  3.30 cm     IVC diam: 1.80 cm RV Mid diam:    2.70 cm RV S prime:     15.90 cm/s TAPSE (M-mode): 2.2 cm RVSP:           37.3 mmHg LEFT ATRIUM             Index        RIGHT ATRIUM           Index LA diam:        3.80 cm 2.25 cm/m   RA Pressure: 3.00 mmHg LA Vol (A2C):   41.7 ml 24.73 ml/m  RA Area:     14.30 cm LA Vol (A4C):   37.6 ml 22.30 ml/m  RA Volume:   32.70 ml  19.39 ml/m LA Biplane Vol: 39.7 ml 23.54 ml/m  AORTIC VALVE LVOT Vmax:   92.20 cm/s LVOT Vmean:  60.800 cm/s LVOT VTI:    0.219 m  AORTA Ao Root diam: 3.30 cm Ao Asc diam:  2.90 cm MITRAL VALVE               TRICUSPID VALVE MV Area (PHT): 3.17 cm    TR Peak grad:   34.3 mmHg MV Decel Time: 239 msec    TR Vmax:        293.00 cm/s MV E velocity: 84.40 cm/s  Estimated RAP:  3.00 mmHg MV A velocity: 91.70 cm/s  RVSP:           37.3 mmHg MV E/A ratio:  0.92                            SHUNTS                            Systemic VTI:  0.22 m  Systemic Diam: 2.10 cm Zoila Shutter MD Electronically signed by Zoila Shutter MD Signature  Date/Time: 06/09/2023/4:04:26 PM    Final (Updated)    MYOCARDIAL PERFUSION IMAGING  Result Date: 06/05/2023   The study is normal. The study is low risk.   No ST deviation was noted.   Left ventricular function is normal. Nuclear stress EF: 61%. The left ventricular ejection fraction is normal (55-65%). End diastolic cavity size is normal. End systolic cavity size is normal.   Prior study available for comparison. Normal study. Low risk.  Similar to 2014 Report.    Microbiology: Results for orders placed or performed during the hospital encounter of 06/17/23  Resp panel by RT-PCR (RSV, Flu A&B, Covid) Anterior Nasal Swab     Status: Abnormal   Collection Time: 06/17/23 12:28 PM   Specimen: Anterior Nasal Swab  Result Value Ref Range Status   SARS Coronavirus 2 by RT PCR POSITIVE (A) NEGATIVE Final    Comment: (NOTE) SARS-CoV-2 target nucleic acids are DETECTED.  The SARS-CoV-2 RNA is generally detectable in upper respiratory specimens during the acute phase of infection. Positive results are indicative of the presence of the identified virus, but do not rule out bacterial infection or co-infection with other pathogens not detected by the test. Clinical correlation with patient history and other diagnostic information is necessary to determine patient infection status. The expected result is Negative.  Fact Sheet for Patients: BloggerCourse.com  Fact Sheet for Healthcare Providers: SeriousBroker.it  This test is not yet approved or cleared by the [place] FDA and  has been authorized for detection and/or diagnosis of SARS-CoV-2 by FDA under an Emergency Use Authorization (EUA).  This EUA will remain in effect (meaning this test can be used) for the duration of  the COVID-19 declaration under Section 564(b)(1) of the A ct, 21 U.S.C. section 360bbb-3(b)(1), unless the authorization is terminated or revoked sooner.     Influenza  A by PCR NEGATIVE NEGATIVE Final   Influenza B by PCR NEGATIVE NEGATIVE Final    Comment: (NOTE) The Xpert Xpress SARS-CoV-2/FLU/RSV plus assay is intended as an aid in the diagnosis of influenza from Nasopharyngeal swab specimens and should not be used as a sole basis for treatment. Nasal washings and aspirates are unacceptable for Xpert Xpress SARS-CoV-2/FLU/RSV testing.  Fact Sheet for Patients: BloggerCourse.com  Fact Sheet for Healthcare Providers: SeriousBroker.it  This test is not yet approved or cleared by the [place] FDA and has been authorized for detection and/or diagnosis of SARS-CoV-2 by FDA under an Emergency Use Authorization (EUA). This EUA will remain in effect (meaning this test can be used) for the duration of the COVID-19 declaration under Section 564(b)(1) of the Act, 21 U.S.C. section 360bbb-3(b)(1), unless the authorization is terminated or revoked.     Resp Syncytial Virus by PCR NEGATIVE NEGATIVE Final    Comment: (NOTE) Fact Sheet for Patients: BloggerCourse.com  Fact Sheet for Healthcare Providers: SeriousBroker.it  This test is not yet approved or cleared by the [place] FDA and has been authorized for detection and/or diagnosis of SARS-CoV-2 by FDA under an Emergency Use Authorization (EUA). This EUA will remain in effect (meaning this test can be used) for the duration of the COVID-19 declaration under Section 564(b)(1) of the Act, 21 U.S.C. section 360bbb-3(b)(1), unless the authorization is terminated or revoked.  Performed at [facility], [address] [place], [place] [address]   Gastrointestinal Panel by PCR , Stool     Status: None  Collection Time: 06/18/23  3:00 PM   Specimen: Stool  Result Value Ref Range Status   Campylobacter species NOT DETECTED NOT DETECTED Final   Plesimonas shigelloides NOT DETECTED NOT  DETECTED Final   Salmonella species NOT DETECTED NOT DETECTED Final   Yersinia enterocolitica NOT DETECTED NOT DETECTED Final   Vibrio species NOT DETECTED NOT DETECTED Final   Vibrio cholerae NOT DETECTED NOT DETECTED Final   Enteroaggregative E coli (EAEC) NOT DETECTED NOT DETECTED Final   Enteropathogenic E coli (EPEC) NOT DETECTED NOT DETECTED Final   Enterotoxigenic E coli (ETEC) NOT DETECTED NOT DETECTED Final   Shiga like toxin producing E coli (STEC) NOT DETECTED NOT DETECTED Final   Shigella/Enteroinvasive E coli (EIEC) NOT DETECTED NOT DETECTED Final   Cryptosporidium NOT DETECTED NOT DETECTED Final   Cyclospora cayetanensis NOT DETECTED NOT DETECTED Final   Entamoeba histolytica NOT DETECTED NOT DETECTED Final   Giardia lamblia NOT DETECTED NOT DETECTED Final   Adenovirus F40/41 NOT DETECTED NOT DETECTED Final   Astrovirus NOT DETECTED NOT DETECTED Final   Norovirus GI/GII NOT DETECTED NOT DETECTED Final   Rotavirus A NOT DETECTED NOT DETECTED Final   Sapovirus (I, II, IV, and V) NOT DETECTED NOT DETECTED Final    Comment: Performed at [facility], [address] [place], [place] [address]  C Difficile Quick Screen w PCR reflex     Status: Abnormal   Collection Time: 06/18/23  3:00 PM   Specimen: Stool  Result Value Ref Range Status   C Diff antigen POSITIVE (A) NEGATIVE Final   C Diff toxin NEGATIVE NEGATIVE Final   C Diff interpretation Results are indeterminate. See PCR results.  Final    Comment: Performed at [facility], [address] [place], [place] [address]  C. Diff by PCR, Reflexed     Status: None   Collection Time: 06/18/23  3:00 PM  Result Value Ref Range Status   Toxigenic C. Difficile by PCR NEGATIVE NEGATIVE Final    Comment: Patient is colonized with non toxigenic C. difficile. May not need treatment unless significant symptoms are present. Performed at [facility], [address] [place], [place] [address]      Labs: CBC: Recent Labs  Lab 06/17/23 1228 06/18/23 0647 06/19/23 0618  WBC 5.5 6.5 7.8  NEUTROABS 4.4  --   --   HGB 12.6* 12.0* 11.8*  HCT 37.8* 35.0* 34.8*  MCV 95.0 92.6 94.1  PLT 197 210 215   Basic Metabolic Panel: Recent Labs  Lab 06/17/23 1228 06/18/23 0647 06/18/23 1359 06/19/23 0618  NA 135 134*  --  137  K 3.3* 2.5* 3.4* 4.1  CL 99 102  --  108  CO2 27 21*  --  21*  GLUCOSE 116* 106*  --  120*  BUN 16 15  --  15  CREATININE 0.68 0.58*  --  0.55*  CALCIUM 8.8* 8.8*  --  9.1  MG  --  2.0  --  2.1  PHOS  --   --   --  1.4*   Liver Function Tests: Recent Labs  Lab 06/17/23 1228  AST 20  ALT 12  ALKPHOS 108  BILITOT 0.6  PROT 7.7  ALBUMIN 3.2*   CBG: No results for input(s): "GLUCAP" in the last 168 hours.  Discharge time spent: greater than 30 minutes.  Signed: Marrion Coy, MD [facility] 06/19/2023

## 2023-06-19 NOTE — Evaluation (Signed)
Physical Therapy Evaluation Patient Details Name: [name] MRN: [id number] DOB: Feb 19, 1946 Today's Date: 06/19/2023  History of Present Illness  Pt is a 77 y.o. male presenting to hospital 06/17/23 with c/o "generalized bodyaches, shortness of breath, productive cough with white sputum, intermittent chest pain, vague abdominal pain, nausea symptoms over the past 3 days".  Pt admitted with gastroenteritis d/t COVID-19 virus and encephalopathy.   PMH includes OA, CAD s/p PCI and statin to RCA, COPD, depression, GI bleeding, tobacco abuse, back sx, triceps tendon repair R, L foot sx, htn, h/o lung CA, dementia, scoliosis.  Hospitalization in March with multiple rib fx's from fall and PNA.  Clinical Impression  Prior to hospital admission, pt was independent with functional mobility; lives alone; reports his daughter can assist some if needed.  Currently pt is CGA to SBA with bed mobility; CGA with transfers; and CGA to ambulate 30 feet (no AD use).  Pt steady ambulating but limited distance walking d/t pt's c/o significant scrotum pain (at rest and with walking)--pt reports scrotum pain from diarrhea (MD and nurse notified of pt's c/o significant scrotum pain).  Generalized weakness noted.  Pt would currently benefit from skilled PT to address noted impairments and functional limitations (see below for any additional details).  Upon hospital discharge, pt would benefit from ongoing therapy.     If plan is discharge home, recommend the following: A little help with walking and/or transfers;A little help with bathing/dressing/bathroom;Assistance with cooking/housework;Assist for transportation;Help with stairs or ramp for entrance   Can travel by private vehicle    Yes    Equipment Recommendations  (pt has RW at home if needed)  Recommendations for Other Services       Functional Status Assessment Patient has had a recent decline in their functional status and demonstrates the ability to make  significant improvements in function in a reasonable and predictable amount of time.     Precautions / Restrictions Precautions Precautions: Fall Restrictions Weight Bearing Restrictions: No      Mobility  Bed Mobility Overal bed mobility: Needs Assistance Bed Mobility: Supine to Sit, Sit to Supine     Supine to sit: Contact guard (increased effort to perform on own) Sit to supine: Supervision        Transfers Overall transfer level: Needs assistance Equipment used: None Transfers: Sit to/from Stand Sit to Stand: Contact guard assist           General transfer comment: x2 trials standing from bed; mild increased effort to stand but steady    Ambulation/Gait Ambulation/Gait assistance: Contact guard assist Gait Distance (Feet): 30 Feet Assistive device: None Gait Pattern/deviations: Step-through pattern Gait velocity: decreased     General Gait Details: steady ambulation; limited distance walking d/t pt reporting significant scrotum pain with walking  Stairs            Wheelchair Mobility     Tilt Bed    Modified Rankin (Stroke Patients Only)       Balance Overall balance assessment: Needs assistance Sitting-balance support: No upper extremity supported, Feet supported Sitting balance-Leahy Scale: Normal Sitting balance - Comments: steady reaching outside BOS   Standing balance support: No upper extremity supported, During functional activity Standing balance-Leahy Scale: Good Standing balance comment: no loss of balance noted during ambulation                             Pertinent Vitals/Pain Pain Assessment Pain Assessment: 0-10  Pain Score: 9  Pain Location: scrotum (and chronic back pain) Pain Descriptors / Indicators: Tender, Constant, Grimacing, Guarding Pain Intervention(s): Limited activity within patient's tolerance, Monitored during session, Repositioned, Other (comment) (RN and MD notified regarding significant  scrotum pain) HR 70-84 bpm and SpO2 sats 97% or greater on room air during sessions activities.    Home Living Family/patient expects to be discharged to:: Private residence Living Arrangements: Alone Available Help at Discharge: Family (pt's daughter can assist some (lives about 10 minutes away)) Type of Home: House Home Access: Stairs to enter Entrance Stairs-Rails: None Entrance Stairs-Number of Steps: 3   Home Layout: Laundry or work area in basement Home Equipment: Agricultural consultant (2 wheels);Gilmer Mor - single point      Prior Function Prior Level of Function : Independent/Modified Independent             Mobility Comments: Last fall in March 2024.       Extremity/Trunk Assessment   Upper Extremity Assessment Upper Extremity Assessment: Overall WFL for tasks assessed    Lower Extremity Assessment Lower Extremity Assessment: Generalized weakness    Cervical / Trunk Assessment Cervical / Trunk Assessment: Other exceptions Cervical / Trunk Exceptions: scoliosis  Communication   Communication Communication: No apparent difficulties Cueing Techniques: Verbal cues  Cognition Arousal: Alert Behavior During Therapy: WFL for tasks assessed/performed                                   General Comments: Oriented to person, place, and general situation.        General Comments  Nursing cleared pt for participation in physical therapy.  Pt agreeable to PT session.    Exercises     Assessment/Plan    PT Assessment Patient needs continued PT services  PT Problem List Decreased strength;Decreased activity tolerance;Decreased balance;Decreased mobility;Decreased cognition;Pain       PT Treatment Interventions DME instruction;Gait training;Stair training;Functional mobility training;Therapeutic activities;Therapeutic exercise;Balance training;Patient/family education    PT Goals (Current goals can be found in the Care Plan section)  Acute Rehab PT  Goals Patient Stated Goal: to improve strength and mobility PT Goal Formulation: With patient Time For Goal Achievement: 07/03/23 Potential to Achieve Goals: Good    Frequency Min 1X/week     Co-evaluation               AM-PAC PT "6 Clicks" Mobility  Outcome Measure Help needed turning from your back to your side while in a flat bed without using bedrails?: None Help needed moving from lying on your back to sitting on the side of a flat bed without using bedrails?: A Little Help needed moving to and from a bed to a chair (including a wheelchair)?: A Little Help needed standing up from a chair using your arms (e.g., wheelchair or bedside chair)?: A Little Help needed to walk in hospital room?: A Little Help needed climbing 3-5 steps with a railing? : A Little 6 Click Score: 19    End of Session Equipment Utilized During Treatment: Gait belt Activity Tolerance: Patient limited by fatigue Patient left: in bed;with call bell/phone within reach;with bed alarm set;with nursing/sitter in room Nurse Communication: Mobility status;Precautions;Other (comment) (pt's c/o significant scrotum pain) PT Visit Diagnosis: Other abnormalities of gait and mobility (R26.89);Muscle weakness (generalized) (M62.81);History of falling (Z91.81);Pain Pain - part of body:  (scrotum)    Time: 4010-2725 PT Time Calculation (min) (ACUTE ONLY): 27 min  Charges:   PT Evaluation $PT Eval Low Complexity: 1 Low PT Treatments $Therapeutic Activity: 8-22 mins PT General Charges $$ ACUTE PT VISIT: 1 Visit        Hendricks Limes, PT 06/19/23, 12:22 PM

## 2023-06-19 NOTE — Plan of Care (Signed)
  Problem: Coping: Goal: Psychosocial and spiritual needs will be supported Outcome: Progressing   Problem: Respiratory: Goal: Will maintain a patent airway Outcome: Progressing   Problem: Clinical Measurements: Goal: Diagnostic test results will improve Outcome: Progressing Goal: Respiratory complications will improve Outcome: Progressing   Problem: Activity: Goal: Risk for activity intolerance will decrease Outcome: Progressing   Problem: Coping: Goal: Level of anxiety will decrease Outcome: Progressing   Problem: Elimination: Goal: Will not experience complications related to bowel motility Outcome: Progressing Goal: Will not experience complications related to urinary retention Outcome: Progressing   Problem: Pain Managment: Goal: General experience of comfort will improve Outcome: Progressing   Problem: Safety: Goal: Ability to remain free from injury will improve Outcome: Progressing

## 2023-06-20 ENCOUNTER — Other Ambulatory Visit: Payer: Self-pay

## 2023-06-20 DIAGNOSIS — A0839 Other viral enteritis: Secondary | ICD-10-CM | POA: Diagnosis not present

## 2023-06-20 DIAGNOSIS — G9349 Other encephalopathy: Secondary | ICD-10-CM | POA: Diagnosis not present

## 2023-06-20 DIAGNOSIS — F0390 Unspecified dementia without behavioral disturbance: Secondary | ICD-10-CM | POA: Diagnosis not present

## 2023-06-20 DIAGNOSIS — U071 COVID-19: Secondary | ICD-10-CM | POA: Diagnosis not present

## 2023-06-20 LAB — CBC
HCT: 33 % — ABNORMAL LOW (ref 39.0–52.0)
Hemoglobin: 11 g/dL — ABNORMAL LOW (ref 13.0–17.0)
MCH: 31.8 pg (ref 26.0–34.0)
MCHC: 33.3 g/dL (ref 30.0–36.0)
MCV: 95.4 fL (ref 80.0–100.0)
Platelets: 194 10*3/uL (ref 150–400)
RBC: 3.46 MIL/uL — ABNORMAL LOW (ref 4.22–5.81)
RDW: 13.9 % (ref 11.5–15.5)
WBC: 7 10*3/uL (ref 4.0–10.5)
nRBC: 0 % (ref 0.0–0.2)

## 2023-06-20 LAB — BASIC METABOLIC PANEL
Anion gap: 7 (ref 5–15)
BUN: 15 mg/dL (ref 8–23)
CO2: 24 mmol/L (ref 22–32)
Calcium: 8.8 mg/dL — ABNORMAL LOW (ref 8.9–10.3)
Chloride: 105 mmol/L (ref 98–111)
Creatinine, Ser: 0.59 mg/dL — ABNORMAL LOW (ref 0.61–1.24)
GFR, Estimated: >60 mL/min (ref >60)
Glucose, Bld: 86 mg/dL (ref 70–99)
Potassium: 3.5 mmol/L (ref 3.5–5.1)
Sodium: 136 mmol/L (ref 135–145)

## 2023-06-20 LAB — MAGNESIUM: Magnesium: 1.7 mg/dL (ref 1.7–2.4)

## 2023-06-20 LAB — PHOSPHORUS: Phosphorus: 2.4 mg/dL — ABNORMAL LOW (ref 2.5–4.6)

## 2023-06-20 MED ORDER — SODIUM BICARBONATE 650 MG PO TABS
650.0000 mg | ORAL_TABLET | Freq: Two times a day (BID) | ORAL | 0 refills | Status: AC
Start: 2023-06-20 — End: 2023-06-23
  Filled 2023-06-20: qty 6, 3d supply, fill #0

## 2023-06-20 MED ORDER — SODIUM BICARBONATE 650 MG PO TABS: 650.0000 mg | ORAL_TABLET | Freq: Two times a day (BID) | ORAL | 0 refills | Status: DC

## 2023-06-20 MED ORDER — POTASSIUM CHLORIDE CRYS ER 20 MEQ PO TBCR
40.0000 meq | EXTENDED_RELEASE_TABLET | Freq: Once | ORAL | Status: AC
  Administered 2023-06-20: 40 meq via ORAL
  Filled 2023-06-20: qty 2

## 2023-06-20 MED ORDER — PANCRELIPASE (LIP-PROT-AMYL) 24000-76000 UNITS PO CPEP
24000.0000 [IU] | ORAL_CAPSULE | Freq: Three times a day (TID) | ORAL | 0 refills | Status: AC
  Filled 2023-06-20: qty 42, 14d supply, fill #0

## 2023-06-20 MED ORDER — POTASSIUM & SODIUM PHOSPHATES 280-160-250 MG PO PACK
1.0000 | PACK | Freq: Three times a day (TID) | ORAL | Status: DC
  Administered 2023-06-20: 1 via ORAL
  Filled 2023-06-20 (×2): qty 1

## 2023-06-20 MED ORDER — POTASSIUM & SODIUM PHOSPHATES 280-160-250 MG PO PACK
1.0000 | PACK | Freq: Three times a day (TID) | ORAL | 0 refills | Status: DC
  Filled 2023-06-20: qty 4, 1d supply, fill #0

## 2023-06-20 MED ORDER — POTASSIUM & SODIUM PHOSPHATES 280-160-250 MG PO PACK
2.0000 | PACK | Freq: Once | ORAL | Status: AC
  Administered 2023-06-20: 2 via ORAL
  Filled 2023-06-20: qty 2

## 2023-06-20 NOTE — Care Management Important Message (Signed)
Important Message  Patient Details  Name: [name] MRN: [id number] Date of Birth: [birth year]   Medicare Important Message Given:  Yes  Patient is in an isolation room and no answer when I called his room [phone]) so I reviewed the Important Message from Medicare with the patient's daughter, [name] by phone [phone]) and obtained verbal consent on the initial IMM and she stated she understood these rights. I will send to Health Information Management to have document scanned into the patient's medical record.  I thanked her for her time and wished him a speedy recovery.   Olegario Messier A Kline Bulthuis 06/20/2023, 11:47 AM

## 2023-06-20 NOTE — Discharge Summary (Signed)
Physician Discharge Summary   Patient: [name] MRN: [id number] DOB: 1946/07/31  Admit date:     06/17/2023  Discharge date: 06/20/23  Discharge Physician: Marrion Coy   PCP: Barbette Reichmann, MD   Recommendations at discharge:   Follow-up with PCP in 1 week.  Discharge Diagnoses: Principal Problem:   Gastroenteritis due to COVID-19 virus Active Problems:   Encephalopathy due to COVID-19 virus   Dementia without behavioral disturbance (HCC)   Chronic obstructive pulmonary disease (COPD) (HCC)   Lung cancer (HCC)   CAD (coronary artery disease)   Depression   Hypophosphatemia Moderate protein calorie malnutrition. Resolved Problems:   * No resolved hospital problems. [facility] Course: [name] is a 77 y.o. male with medical history significant for HTN, dementia, depression, hospitalized in March with multiple rib fractures from a fall as well as pneumonia, lung cancer pending biopsy which was scheduled for today, who presents to the ED with a 2-day history of vomiting, weakness, poor oral intake and mild confusion.  He was found to have severe hypokalemia, is giving IV fluids and potassium. Condition had improved, no longer has any confusion.  Potassium normalized.  Phosphate is low at 1.4, will give IV infusion.  Patient is medically stable for discharge after infusion of phosphate. But family does not feel patient is ready for discharge yesterday.  Patient was kept in the hospital for another day.  Apparently patient was taking MS Contin chronically at home, which was not restarted until yesterday.  Patient feels much better after restarting MS Contin today, medically stable for discharge. Assessment and Plan: * Gastroenteritis due to COVID-19 virus Severe hypokalemia secondary to nausea vomiting.  Improved. Hypophosphatemia. Mild hyponatremia resolved. Minimal metabolic acidosis. Patient was given IV fluids, potassium.  Condition had improved.  Potassium  normalized.  Patient phosphate was 1.4, received 30 mmol of sodium phosphate yesterday, Phos level increased to 2.4 today.  I will also continue oral Neutra-Phos for 1 day. Patient also has some chronic diarrhea, consider possibility of malabsorption.  Creon was given.  Diarrhea much better 1 dose of oral potassium at 40 mEq for potassium of 3.5. Currently, patient is medically stable for discharge.     Acute metabolic encephalopathy due to COVID-19 virus Dementia without behavioral disturbance Condition had improved.   Dysphagia. Patient doing better today, seen by speech therapy, moderate risk for aspiration.  Instructions to avoid aspiration was provided by speech therapy.   Lung cancer [facility]) Patient was scheduled for biopsy of mass on 9/3 but missed appointment due to current illness Need to reschedule biopsy.  Patient and daughter aware.   Chronic obstructive pulmonary disease (COPD) (HCC) Resume home medicines.   Depression Continue escitalopram   CAD (coronary artery disease) Resume home treatment.         Consultants: None Procedures performed: None  Disposition: Home Diet recommendation:  Discharge Diet Orders (From admission, onward)     Start     Ordered   06/20/23 0000  Diet general       Comments: Dys 3 diet   06/20/23 1007   06/19/23 0000  Diet - low sodium heart healthy        06/19/23 1047           Dysphagia type 3 thin Liquid DISCHARGE MEDICATION: Allergies as of 06/20/2023       Reactions   Levofloxacin Itching, Rash   Doxycycline Nausea And Vomiting        Medication List  STOP taking these medications    ibuprofen 800 MG tablet Commonly known as: ADVIL       TAKE these medications    aspirin EC 81 MG tablet Take 1 tablet (81 mg total) by mouth daily.   cyanocobalamin 500 MCG tablet Commonly known as: VITAMIN B12 Take 500 mcg by mouth 2 (two) times daily. VITAMIN B-12   donepezil 10 MG tablet Commonly known as:  ARICEPT TAKE ONE-HALF TABLET BY MOUTH EVERY DAY FOR DEMENTIA   escitalopram 20 MG tablet Commonly known as: LEXAPRO Take 1 tablet (20 mg total) by mouth daily.   folic acid 1 MG tablet Commonly known as: FOLVITE Take 1 mg by mouth daily.   gabapentin 400 MG capsule Commonly known as: NEURONTIN Take 400 mg by mouth at bedtime.   montelukast 10 MG tablet Commonly known as: SINGULAIR TAKE 1 TABLET AT BEDTIME   morphine 15 MG 12 hr tablet Commonly known as: MS CONTIN Take 15 mg by mouth 2 (two) times daily.   oxyCODONE-acetaminophen 5-325 MG tablet Commonly known as: PERCOCET/ROXICET Take 1 tablet by mouth every 4 (four) hours as needed for moderate pain. For pain   Pancrelipase (Lip-Prot-Amyl) 24000-76000 units Cpep Take 1 capsule (24,000 Units total) by mouth 3 (three) times daily before meals for 14 days.   potassium & sodium phosphates 280-160-250 MG Pack Commonly known as: PHOS-NAK Take 1 packet by mouth 4 (four) times daily -  with meals and at bedtime.   rosuvastatin 40 MG tablet Commonly known as: CRESTOR Take 1 tablet (40 mg total) by mouth daily.   sodium bicarbonate 650 MG tablet Take 1 tablet (650 mg total) by mouth 2 (two) times daily for 7 days.   Vitamin D (Ergocalciferol) 1.25 MG (50000 UNIT) Caps capsule Commonly known as: DRISDOL Take 50,000 Units by mouth every Wednesday.        Follow-up Information     Barbette Reichmann, MD Follow up in 1 week(s).   Specialty: Internal Medicine Why: no answer at office patient to make follow up appt Contact information: [facility]- Internal Medicine [address] [place] [address] [phone]                Discharge Exam: Ceasar Mons Weights   06/17/23 1222  Weight: 57.7 kg   General exam: Appears calm and comfortable,  Respiratory system: Decreased breath sounds. Respiratory effort normal. Cardiovascular system: S1 & S2 heard, RRR. No JVD, murmurs, rubs, gallops or clicks. No  pedal edema. Gastrointestinal system: Abdomen is nondistended, soft and nontender. No organomegaly or masses felt. Normal bowel sounds heard. Central nervous system: Alert and oriented x3. No focal neurological deficits. Extremities: Symmetric 5 x 5 power. Skin: No rashes, lesions or ulcers Psychiatry:  Mood & affect appropriate.    Condition at discharge: good  The results of significant diagnostics from this hospitalization (including imaging, microbiology, ancillary and laboratory) are listed below for reference.   Imaging Studies: CT Head Wo Contrast  Result Date: 06/17/2023 CLINICAL DATA:  acute encephalopathy.  Weakness. EXAM: CT HEAD WITHOUT CONTRAST TECHNIQUE: Contiguous axial images were obtained from the base of the skull through the vertex without intravenous contrast. RADIATION DOSE REDUCTION: This exam was performed according to the departmental dose-optimization program which includes automated exposure control, adjustment of the mA and/or kV according to patient size and/or use of iterative reconstruction technique. COMPARISON:  01/21/2023 FINDINGS: Brain: Mild age related cerebral atrophy. No acute intracranial abnormality. Specifically, no hemorrhage, hydrocephalus, mass lesion, acute infarction, or  significant intracranial injury. Vascular: No hyperdense vessel or unexpected calcification. Skull: No acute calvarial abnormality. Sinuses/Orbits: No acute findings Other: None IMPRESSION: No acute intracranial abnormality. Electronically Signed   By: Charlett Nose M.D.   On: 06/17/2023 19:09   CT ABDOMEN PELVIS W CONTRAST  Result Date: 06/17/2023 CLINICAL DATA:  Generalized abdominal pain EXAM: CT ABDOMEN AND PELVIS WITH CONTRAST TECHNIQUE: Multidetector CT imaging of the abdomen and pelvis was performed using the standard protocol following bolus administration of intravenous contrast. RADIATION DOSE REDUCTION: This exam was performed according to the departmental dose-optimization  program which includes automated exposure control, adjustment of the mA and/or kV according to patient size and/or use of iterative reconstruction technique. CONTRAST:  OMNIPAQUE IOHEXOL 300 MG/ML  SOLN COMPARISON:  03/06/2022.  Chest CT 01/02/2023. FINDINGS: Lower chest: Rounded masslike opacity noted posteriorly in the left lower lobe measuring 4.7 x 2.9 cm. This was noted on prior abdominal CT over 1 year ago, measuring up to 1.9 cm at that time. This was also noted as enlarged and cavitary on more recent chest CT. Appearance and persistence is concerning for lung cancer. Emphysema. No effusions. Hepatobiliary: No focal hepatic abnormality. Gallbladder unremarkable. Pancreas: No focal abnormality or ductal dilatation. Spleen: Spleen upper limits normal in size at 12.9 cm. No focal abnormality. Adrenals/Urinary Tract: No adrenal abnormality. No focal renal abnormality. No stones or hydronephrosis. Urinary bladder appears mildly thick walled, possibly rib related to bladder outlet obstruction. 2 mm layering bladder stone. Stomach/Bowel: Sigmoid diverticulosis. No active diverticulitis. Stomach and small bowel decompressed, unremarkable. Vascular/Lymphatic: Aortoiliac atherosclerosis. No evidence of aneurysm or adenopathy. Reproductive: No visible focal abnormality. Other: No free fluid or free air. Musculoskeletal: Postoperative and degenerative changes. No acute bony abnormality. IMPRESSION: Rounded mass like opacity posteriorly in the left lower lobe, enlarging since prior abdominal CT, similar to more recent chest CT. Appearance is concerning for malignancy. 2 mm layering bladder stone. No renal or ureteral stones. No hydronephrosis. Sigmoid diverticulosis. Mild bladder wall thickening may be related to chronic bladder outlet obstruction. Aortoiliac atherosclerosis. Emphysema. Electronically Signed   By: Charlett Nose M.D.   On: 06/17/2023 19:08   DG Chest Portable 1 View  Result Date:  06/17/2023 CLINICAL DATA:  Shortness of breath EXAM: PORTABLE CHEST 1 VIEW COMPARISON:  X-ray 01/21/2023 FINDINGS: Film is rotated to the left. Stable cardiopericardial silhouette. Calcified aorta. Diffuse interstitial changes to the lungs and other chronic changes. No pneumothorax or effusion. No consolidation. Overlapping cardiac leads. Curvature and degenerative changes of the spine. Osteopenia. IMPRESSION: Rotated radiograph.  Hyperinflation with chronic changes. Electronically Signed   By: Karen Kays M.D.   On: 06/17/2023 15:02   ECHOCARDIOGRAM COMPLETE  Result Date: 06/09/2023    ECHOCARDIOGRAM REPORT   Patient Name:   RUSTON RAPOZO Date of Exam: 06/09/2023 Medical Rec #:  [id number]      Height:       68.0 in Accession #:    [id number]     Weight:       127.2 lb Date of Birth:  22-Nov-1945       BSA:          1.686 m Patient Age:    77 years       BP:           116/58 mmHg Patient Gender: M              HR:           69 bpm. Exam Location:  [address] Procedure: 2D Echo, 3D Echo, Cardiac Doppler, Color Doppler and Strain Analysis Indications:     R06.02 SOB  History:         Patient has no prior history of Echocardiogram examinations.                  CAD, Status post stent RCA, COPD and Lung cancer status post                  radiotherapy, Signs/Symptoms:Shortness of Breath; Risk                  Factors:Hypertension.  Sonographer:     Chanetta Marshall BA, RDCS Referring Phys:  Evern Bio WEAVER Diagnosing Phys: Zoila Shutter MD IMPRESSIONS  1. Left ventricular ejection fraction, by estimation, is 60 to 65%. The left ventricle has normal function. The left ventricle has no regional wall motion abnormalities. Left ventricular diastolic parameters are consistent with Grade I diastolic dysfunction (impaired relaxation).  2. Right ventricular systolic function is normal. The right ventricular size is normal. There is mildly elevated pulmonary artery systolic pressure. The estimated right ventricular systolic  pressure is 37.3 mmHg.  3. The mitral valve is abnormal. Trivial mitral valve regurgitation.  4. The posterior leaflet appears thickened. The tricuspid valve is abnormal.  5. The non-coronary cusp appears calcified and immobile. The aortic valve is tricuspid. Aortic valve regurgitation is not visualized. Aortic valve sclerosis/calcification is present, without any evidence of aortic stenosis.  6. The inferior vena cava is normal in size with greater than 50% respiratory variability, suggesting right atrial pressure of 3 mmHg. Comparison(s): No prior Echocardiogram. FINDINGS  Left Ventricle: Left ventricular ejection fraction, by estimation, is 60 to 65%. The left ventricle has normal function. The left ventricle has no regional wall motion abnormalities. Global longitudinal strain performed but not reported based on interpreter judgement due to suboptimal tracking. 3D ejection fraction reviewed and evaluated as part of the interpretation. Alternate measurement of EF is felt to be most reflective of LV function. The left ventricular internal cavity size was normal in  size. There is no left ventricular hypertrophy. Left ventricular diastolic parameters are consistent with Grade I diastolic dysfunction (impaired relaxation). Indeterminate filling pressures. Right Ventricle: The right ventricular size is normal. No increase in right ventricular wall thickness. Right ventricular systolic function is normal. There is mildly elevated pulmonary artery systolic pressure. The tricuspid regurgitant velocity is 2.93  m/s, and with an assumed right atrial pressure of 3 mmHg, the estimated right ventricular systolic pressure is 37.3 mmHg. Left Atrium: Left atrial size was normal in size. Right Atrium: Right atrial size was normal in size. Pericardium: There is no evidence of pericardial effusion. Mitral Valve: The mitral valve is abnormal. Mild mitral annular calcification. Trivial mitral valve regurgitation. Tricuspid Valve:  The posterior leaflet appears thickened. The tricuspid valve is abnormal. Tricuspid valve regurgitation is trivial. Aortic Valve: The non-coronary cusp appears calcified and immobile. The aortic valve is tricuspid. Aortic valve regurgitation is not visualized. Aortic valve sclerosis/calcification is present, without any evidence of aortic stenosis. Pulmonic Valve: The pulmonic valve was grossly normal. Pulmonic valve regurgitation is trivial. Aorta: The aortic root and ascending aorta are structurally normal, with no evidence of dilitation. Venous: The inferior vena cava is normal in size with greater than 50% respiratory variability, suggesting right atrial pressure of 3 mmHg. IAS/Shunts: No atrial level shunt detected by color flow Doppler.  LEFT VENTRICLE PLAX 2D LVIDd:  3.90 cm   Diastology LVIDs:         2.20 cm   LV e' medial:    7.83 cm/s LV PW:         0.90 cm   LV E/e' medial:  10.8 LV IVS:        1.00 cm   LV e' lateral:   11.90 cm/s LVOT diam:     2.10 cm   LV E/e' lateral: 7.1 LV SV:         76 LV SV Index:   45        2D Longitudinal Strain LVOT Area:     3.46 cm  2D Strain GLS (A2C):   -16.2 %                          2D Strain GLS (A3C):   -17.2 %                          2D Strain GLS (A4C):   -20.2 %                          2D Strain GLS Avg:     -17.8 %                           3D Volume EF:                          3D EF:        57 %                          LV EDV:       115 ml                          LV ESV:       50 ml                          LV SV:        65 ml RIGHT VENTRICLE             IVC RV Basal diam:  3.30 cm     IVC diam: 1.80 cm RV Mid diam:    2.70 cm RV S prime:     15.90 cm/s TAPSE (M-mode): 2.2 cm RVSP:           37.3 mmHg LEFT ATRIUM             Index        RIGHT ATRIUM           Index LA diam:        3.80 cm 2.25 cm/m   RA Pressure: 3.00 mmHg LA Vol (A2C):   41.7 ml 24.73 ml/m  RA Area:     14.30 cm LA Vol (A4C):   37.6 ml 22.30 ml/m  RA Volume:   32.70 ml   19.39 ml/m LA Biplane Vol: 39.7 ml 23.54 ml/m  AORTIC VALVE LVOT Vmax:   92.20 cm/s LVOT Vmean:  60.800 cm/s LVOT VTI:    0.219 m  AORTA Ao Root diam: 3.30 cm Ao Asc diam:  2.90 cm MITRAL VALVE  TRICUSPID VALVE MV Area (PHT): 3.17 cm    TR Peak grad:   34.3 mmHg MV Decel Time: 239 msec    TR Vmax:        293.00 cm/s MV E velocity: 84.40 cm/s  Estimated RAP:  3.00 mmHg MV A velocity: 91.70 cm/s  RVSP:           37.3 mmHg MV E/A ratio:  0.92                            SHUNTS                            Systemic VTI:  0.22 m                            Systemic Diam: 2.10 cm Zoila Shutter MD Electronically signed by Zoila Shutter MD Signature Date/Time: 06/09/2023/4:04:26 PM    Final (Updated)    MYOCARDIAL PERFUSION IMAGING  Result Date: 06/05/2023   The study is normal. The study is low risk.   No ST deviation was noted.   Left ventricular function is normal. Nuclear stress EF: 61%. The left ventricular ejection fraction is normal (55-65%). End diastolic cavity size is normal. End systolic cavity size is normal.   Prior study available for comparison. Normal study. Low risk.  Similar to 2014 Report.    Microbiology: Results for orders placed or performed during the hospital encounter of 06/17/23  Resp panel by RT-PCR (RSV, Flu A&B, Covid) Anterior Nasal Swab     Status: Abnormal   Collection Time: 06/17/23 12:28 PM   Specimen: Anterior Nasal Swab  Result Value Ref Range Status   SARS Coronavirus 2 by RT PCR POSITIVE (A) NEGATIVE Final    Comment: (NOTE) SARS-CoV-2 target nucleic acids are DETECTED.  The SARS-CoV-2 RNA is generally detectable in upper respiratory specimens during the acute phase of infection. Positive results are indicative of the presence of the identified virus, but do not rule out bacterial infection or co-infection with other pathogens not detected by the test. Clinical correlation with patient history and other diagnostic information is necessary to determine  patient infection status. The expected result is Negative.  Fact Sheet for Patients: BloggerCourse.com  Fact Sheet for Healthcare Providers: SeriousBroker.it  This test is not yet approved or cleared by the [place] FDA and  has been authorized for detection and/or diagnosis of SARS-CoV-2 by FDA under an Emergency Use Authorization (EUA).  This EUA will remain in effect (meaning this test can be used) for the duration of  the COVID-19 declaration under Section 564(b)(1) of the A ct, 21 U.S.C. section 360bbb-3(b)(1), unless the authorization is terminated or revoked sooner.     Influenza A by PCR NEGATIVE NEGATIVE Final   Influenza B by PCR NEGATIVE NEGATIVE Final    Comment: (NOTE) The Xpert Xpress SARS-CoV-2/FLU/RSV plus assay is intended as an aid in the diagnosis of influenza from Nasopharyngeal swab specimens and should not be used as a sole basis for treatment. Nasal washings and aspirates are unacceptable for Xpert Xpress SARS-CoV-2/FLU/RSV testing.  Fact Sheet for Patients: BloggerCourse.com  Fact Sheet for Healthcare Providers: SeriousBroker.it  This test is not yet approved or cleared by the [place] FDA and has been authorized for detection and/or diagnosis of SARS-CoV-2 by FDA under an Emergency Use Authorization (EUA). This EUA will remain  in effect (meaning this test can be used) for the duration of the COVID-19 declaration under Section 564(b)(1) of the Act, 21 U.S.C. section 360bbb-3(b)(1), unless the authorization is terminated or revoked.     Resp Syncytial Virus by PCR NEGATIVE NEGATIVE Final    Comment: (NOTE) Fact Sheet for Patients: BloggerCourse.com  Fact Sheet for Healthcare Providers: SeriousBroker.it  This test is not yet approved or cleared by the [place] FDA and has been  authorized for detection and/or diagnosis of SARS-CoV-2 by FDA under an Emergency Use Authorization (EUA). This EUA will remain in effect (meaning this test can be used) for the duration of the COVID-19 declaration under Section 564(b)(1) of the Act, 21 U.S.C. section 360bbb-3(b)(1), unless the authorization is terminated or revoked.  Performed at [facility], [address] [place], [place] [address]   Gastrointestinal Panel by PCR , Stool     Status: None   Collection Time: 06/18/23  3:00 PM   Specimen: Stool  Result Value Ref Range Status   Campylobacter species NOT DETECTED NOT DETECTED Final   Plesimonas shigelloides NOT DETECTED NOT DETECTED Final   Salmonella species NOT DETECTED NOT DETECTED Final   Yersinia enterocolitica NOT DETECTED NOT DETECTED Final   Vibrio species NOT DETECTED NOT DETECTED Final   Vibrio cholerae NOT DETECTED NOT DETECTED Final   Enteroaggregative E coli (EAEC) NOT DETECTED NOT DETECTED Final   Enteropathogenic E coli (EPEC) NOT DETECTED NOT DETECTED Final   Enterotoxigenic E coli (ETEC) NOT DETECTED NOT DETECTED Final   Shiga like toxin producing E coli (STEC) NOT DETECTED NOT DETECTED Final   Shigella/Enteroinvasive E coli (EIEC) NOT DETECTED NOT DETECTED Final   Cryptosporidium NOT DETECTED NOT DETECTED Final   Cyclospora cayetanensis NOT DETECTED NOT DETECTED Final   Entamoeba histolytica NOT DETECTED NOT DETECTED Final   Giardia lamblia NOT DETECTED NOT DETECTED Final   Adenovirus F40/41 NOT DETECTED NOT DETECTED Final   Astrovirus NOT DETECTED NOT DETECTED Final   Norovirus GI/GII NOT DETECTED NOT DETECTED Final   Rotavirus A NOT DETECTED NOT DETECTED Final   Sapovirus (I, II, IV, and V) NOT DETECTED NOT DETECTED Final    Comment: Performed at [facility], [address] [place], [place] [address]  C Difficile Quick Screen w PCR reflex     Status: Abnormal   Collection Time: 06/18/23  3:00 PM   Specimen: Stool  Result  Value Ref Range Status   C Diff antigen POSITIVE (A) NEGATIVE Final   C Diff toxin NEGATIVE NEGATIVE Final   C Diff interpretation Results are indeterminate. See PCR results.  Final    Comment: Performed at [facility], [address] [place], [place] [address]  C. Diff by PCR, Reflexed     Status: None   Collection Time: 06/18/23  3:00 PM  Result Value Ref Range Status   Toxigenic C. Difficile by PCR NEGATIVE NEGATIVE Final    Comment: Patient is colonized with non toxigenic C. difficile. May not need treatment unless significant symptoms are present. Performed at [facility], [address] [place], [place] [address]     Labs: CBC: Recent Labs  Lab 06/17/23 1228 06/18/23 0647 06/19/23 0618 06/20/23 0549  WBC 5.5 6.5 7.8 7.0  NEUTROABS 4.4  --   --   --   HGB 12.6* 12.0* 11.8* 11.0*  HCT 37.8* 35.0* 34.8* 33.0*  MCV 95.0 92.6 94.1 95.4  PLT 197 210 215 194   Basic Metabolic Panel: Recent  Labs  Lab 06/17/23 1228 06/18/23 0647 06/18/23 1359 06/19/23 0618 06/20/23 0549  NA 135 134*  --  137 136  K 3.3* 2.5* 3.4* 4.1 3.5  CL 99 102  --  108 105  CO2 27 21*  --  21* 24  GLUCOSE 116* 106*  --  120* 86  BUN 16 15  --  15 15  CREATININE 0.68 0.58*  --  0.55* 0.59*  CALCIUM 8.8* 8.8*  --  9.1 8.8*  MG  --  2.0  --  2.1 1.7  PHOS  --   --   --  1.4* 2.4*   Liver Function Tests: Recent Labs  Lab 06/17/23 1228  AST 20  ALT 12  ALKPHOS 108  BILITOT 0.6  PROT 7.7  ALBUMIN 3.2*   CBG: No results for input(s): "GLUCAP" in the last 168 hours.  Discharge time spent: greater than 30 minutes.  Signed: Marrion Coy, MD [facility] 06/20/2023

## 2023-06-20 NOTE — TOC Transition Note (Signed)
Transition of Care [facility]) - CM/SW Discharge Note   Patient Details  Name: [name] MRN: [id number] Date of Birth: 1946-09-01  Transition of Care [facility]) CM/SW Contact:  Garret Reddish, RN Phone Number: 06/20/2023, 12:09 PM   Clinical Narrative:    Chart reviewed.  Noted that patient has orders for discharge today.    I have spoken with patient and his daughter [name].  [name] reports that prior to admission [name] lived at home by himself.  [name] reports that patient did not need Assistive devices to get around in the hone.  [name] reports that she takes patient to medical appts and assist with errands.  [name] reports that Medications are afford.  I have spoken to [name] and [name] about the Home Health PT/OT services.  Patient did not have a home health agency preference.  I have asked Barbara Cower with [facility] to accept home health referral for PT and OT services. Barbara Cower reports that Start of Care will be Sunday September 8th 2024.  [name] reports that she will transport her father home today.    I have informed staff nurse of the above information.    Final next level of care: Home w Home Health Services Barriers to Discharge: No Barriers Identified   Patient Goals and CMS Choice   Choice offered to / list presented to : Adult Children, Patient  Discharge Placement                  Patient to be transferred to facility by: patient's daughter [name] will transport patient home today. Name of family member notified: [name] Patient and family notified of of transfer: 06/20/23  Discharge Plan and Services Additional resources added to the After Visit Summary for                  DME Arranged:  (Patient has RW at home.)         HH Arranged: PT, OT HH Agency: [facility] Date HH Agency Contacted: 06/20/23 Time HH Agency Contacted: 1100 Representative spoke with at [facility] Agency: Barbara Cower  Social Determinants of  Health (SDOH) Interventions SDOH Screenings   Food Insecurity: No Food Insecurity (06/18/2023)  Housing: Low Risk  (06/18/2023)  Transportation Needs: No Transportation Needs (06/18/2023)  Utilities: Not At Risk (06/18/2023)  Financial Resource Strain: Low Risk  (01/17/2023)   Received from [facility]  Tobacco Use: High Risk (06/18/2023)     Readmission Risk Interventions     No data to display

## 2023-06-23 NOTE — Group Note (Deleted)

## 2023-06-25 ENCOUNTER — Telehealth: Payer: Self-pay

## 2023-07-01 ENCOUNTER — Telehealth: Payer: Self-pay

## 2023-07-04 ENCOUNTER — Telehealth: Payer: Self-pay | Admitting: Cardiovascular Disease

## 2023-07-04 DIAGNOSIS — I2510 Atherosclerotic heart disease of native coronary artery without angina pectoris: Secondary | ICD-10-CM

## 2023-07-04 DIAGNOSIS — E7800 Pure hypercholesterolemia, unspecified: Secondary | ICD-10-CM

## 2023-07-04 DIAGNOSIS — I10 Essential (primary) hypertension: Secondary | ICD-10-CM

## 2023-07-04 NOTE — Telephone Encounter (Signed)
*  STAT* If patient is at the pharmacy, call can be transferred to refill team.   1. Which medications need to be refilled? (please list name of each medication and dose if known) rosuvastatin (CRESTOR) 40 MG tablet  2. Which pharmacy/location (including street and city if local pharmacy) is medication to be sent to? [facility] - [place], [place] - [address]    3. Do they need a 30 day or 90 day supply?  90 day supply  Patient states he has been completely out of medication since the end of August and [facility] told him that they never received the prescription.

## 2023-07-16 ENCOUNTER — Ambulatory Visit: Payer: Medicare Other | Admitting: Physician Assistant

## 2023-07-21 ENCOUNTER — Telehealth: Payer: Self-pay

## 2023-07-28 ENCOUNTER — Emergency Department: Payer: Medicare Other

## 2023-07-28 ENCOUNTER — Inpatient Hospital Stay: Payer: Medicare Other

## 2023-07-28 ENCOUNTER — Other Ambulatory Visit: Payer: Self-pay

## 2023-07-28 ENCOUNTER — Inpatient Hospital Stay
Admission: EM | Admit: 2023-07-28 | Discharge: 2023-07-30 | DRG: 193 | Disposition: A | Discharge status: Home Health | Payer: Medicare Other | Attending: Internal Medicine | Admitting: Internal Medicine

## 2023-07-28 DIAGNOSIS — K219 Gastro-esophageal reflux disease without esophagitis: Secondary | ICD-10-CM | POA: Diagnosis present

## 2023-07-28 DIAGNOSIS — F1120 Opioid dependence, uncomplicated: Secondary | ICD-10-CM | POA: Diagnosis present

## 2023-07-28 DIAGNOSIS — Z602 Problems related to living alone: Secondary | ICD-10-CM | POA: Diagnosis present

## 2023-07-28 DIAGNOSIS — Z9089 Acquired absence of other organs: Secondary | ICD-10-CM

## 2023-07-28 DIAGNOSIS — M419 Scoliosis, unspecified: Secondary | ICD-10-CM | POA: Diagnosis present

## 2023-07-28 DIAGNOSIS — E7800 Pure hypercholesterolemia, unspecified: Secondary | ICD-10-CM | POA: Diagnosis present

## 2023-07-28 DIAGNOSIS — C3490 Malignant neoplasm of unspecified part of unspecified bronchus or lung: Secondary | ICD-10-CM | POA: Diagnosis present

## 2023-07-28 DIAGNOSIS — J441 Chronic obstructive pulmonary disease with (acute) exacerbation: Secondary | ICD-10-CM | POA: Diagnosis present

## 2023-07-28 DIAGNOSIS — Z9861 Coronary angioplasty status: Secondary | ICD-10-CM

## 2023-07-28 DIAGNOSIS — I10 Essential (primary) hypertension: Secondary | ICD-10-CM | POA: Diagnosis present

## 2023-07-28 DIAGNOSIS — Z860101 Personal history of adenomatous and serrated colon polyps: Secondary | ICD-10-CM

## 2023-07-28 DIAGNOSIS — Z1152 Encounter for screening for COVID-19: Secondary | ICD-10-CM | POA: Diagnosis not present

## 2023-07-28 DIAGNOSIS — C3492 Malignant neoplasm of unspecified part of left bronchus or lung: Secondary | ICD-10-CM | POA: Diagnosis not present

## 2023-07-28 DIAGNOSIS — R531 Weakness: Secondary | ICD-10-CM | POA: Diagnosis present

## 2023-07-28 DIAGNOSIS — Z79899 Other long term (current) drug therapy: Secondary | ICD-10-CM

## 2023-07-28 DIAGNOSIS — I2510 Atherosclerotic heart disease of native coronary artery without angina pectoris: Secondary | ICD-10-CM | POA: Diagnosis present

## 2023-07-28 DIAGNOSIS — F03A0 Unspecified dementia, mild, without behavioral disturbance, psychotic disturbance, mood disturbance, and anxiety: Secondary | ICD-10-CM | POA: Diagnosis present

## 2023-07-28 DIAGNOSIS — Z923 Personal history of irradiation: Secondary | ICD-10-CM

## 2023-07-28 DIAGNOSIS — Z85118 Personal history of other malignant neoplasm of bronchus and lung: Secondary | ICD-10-CM

## 2023-07-28 DIAGNOSIS — R64 Cachexia: Secondary | ICD-10-CM | POA: Diagnosis present

## 2023-07-28 DIAGNOSIS — Z8719 Personal history of other diseases of the digestive system: Secondary | ICD-10-CM

## 2023-07-28 DIAGNOSIS — Z881 Allergy status to other antibiotic agents status: Secondary | ICD-10-CM

## 2023-07-28 DIAGNOSIS — Z833 Family history of diabetes mellitus: Secondary | ICD-10-CM

## 2023-07-28 DIAGNOSIS — D649 Anemia, unspecified: Secondary | ICD-10-CM | POA: Diagnosis present

## 2023-07-28 DIAGNOSIS — M549 Dorsalgia, unspecified: Secondary | ICD-10-CM | POA: Diagnosis present

## 2023-07-28 DIAGNOSIS — Z8249 Family history of ischemic heart disease and other diseases of the circulatory system: Secondary | ICD-10-CM

## 2023-07-28 DIAGNOSIS — J189 Pneumonia, unspecified organism: Secondary | ICD-10-CM | POA: Diagnosis present

## 2023-07-28 DIAGNOSIS — Z7982 Long term (current) use of aspirin: Secondary | ICD-10-CM | POA: Diagnosis not present

## 2023-07-28 DIAGNOSIS — F0390 Unspecified dementia without behavioral disturbance: Secondary | ICD-10-CM | POA: Diagnosis not present

## 2023-07-28 DIAGNOSIS — Z720 Tobacco use: Secondary | ICD-10-CM | POA: Diagnosis not present

## 2023-07-28 DIAGNOSIS — J440 Chronic obstructive pulmonary disease with acute lower respiratory infection: Secondary | ICD-10-CM | POA: Diagnosis present

## 2023-07-28 DIAGNOSIS — F17210 Nicotine dependence, cigarettes, uncomplicated: Secondary | ICD-10-CM | POA: Diagnosis present

## 2023-07-28 DIAGNOSIS — R519 Headache, unspecified: Secondary | ICD-10-CM | POA: Diagnosis present

## 2023-07-28 DIAGNOSIS — M1990 Unspecified osteoarthritis, unspecified site: Secondary | ICD-10-CM | POA: Diagnosis present

## 2023-07-28 DIAGNOSIS — F3342 Major depressive disorder, recurrent, in full remission: Secondary | ICD-10-CM | POA: Diagnosis present

## 2023-07-28 DIAGNOSIS — E43 Unspecified severe protein-calorie malnutrition: Secondary | ICD-10-CM | POA: Diagnosis present

## 2023-07-28 DIAGNOSIS — Z9889 Other specified postprocedural states: Secondary | ICD-10-CM

## 2023-07-28 DIAGNOSIS — E871 Hypo-osmolality and hyponatremia: Secondary | ICD-10-CM | POA: Diagnosis not present

## 2023-07-28 DIAGNOSIS — Z681 Body mass index (BMI) 19 or less, adult: Secondary | ICD-10-CM

## 2023-07-28 DIAGNOSIS — Z8616 Personal history of COVID-19: Secondary | ICD-10-CM

## 2023-07-28 DIAGNOSIS — Z951 Presence of aortocoronary bypass graft: Secondary | ICD-10-CM

## 2023-07-28 DIAGNOSIS — D638 Anemia in other chronic diseases classified elsewhere: Secondary | ICD-10-CM | POA: Diagnosis present

## 2023-07-28 DIAGNOSIS — Z820 Family history of epilepsy and other diseases of the nervous system: Secondary | ICD-10-CM

## 2023-07-28 DIAGNOSIS — F32A Depression, unspecified: Secondary | ICD-10-CM | POA: Diagnosis present

## 2023-07-28 LAB — URINALYSIS, ROUTINE W REFLEX MICROSCOPIC
Bilirubin Urine: NEGATIVE
Glucose, UA: NEGATIVE mg/dL
Hgb urine dipstick: NEGATIVE
Ketones, ur: 80 mg/dL — AB
Leukocytes,Ua: NEGATIVE
Nitrite: NEGATIVE
Protein, ur: 100 mg/dL — AB
Specific Gravity, Urine: 1.029 (ref 1.005–1.030)
pH: 5 (ref 5.0–8.0)

## 2023-07-28 LAB — CBC
HCT: 36.3 % — ABNORMAL LOW (ref 39.0–52.0)
Hemoglobin: 12.1 g/dL — ABNORMAL LOW (ref 13.0–17.0)
MCH: 32.2 pg (ref 26.0–34.0)
MCHC: 33.3 g/dL (ref 30.0–36.0)
MCV: 96.5 fL (ref 80.0–100.0)
Platelets: 237 10*3/uL (ref 150–400)
RBC: 3.76 MIL/uL — ABNORMAL LOW (ref 4.22–5.81)
RDW: 15.1 % (ref 11.5–15.5)
WBC: 6.3 10*3/uL (ref 4.0–10.5)
nRBC: 0 % (ref 0.0–0.2)

## 2023-07-28 LAB — BASIC METABOLIC PANEL
Anion gap: 13 (ref 5–15)
BUN: 18 mg/dL (ref 8–23)
CO2: 24 mmol/L (ref 22–32)
Calcium: 9.2 mg/dL (ref 8.9–10.3)
Chloride: 99 mmol/L (ref 98–111)
Creatinine, Ser: 0.74 mg/dL (ref 0.61–1.24)
GFR, Estimated: >60 mL/min (ref >60)
Glucose, Bld: 91 mg/dL (ref 70–99)
Potassium: 4.1 mmol/L (ref 3.5–5.1)
Sodium: 136 mmol/L (ref 135–145)

## 2023-07-28 LAB — BLOOD GAS, VENOUS
Acid-Base Excess: 4.3 mmol/L — ABNORMAL HIGH (ref 0.0–2.0)
Bicarbonate: 29.8 mmol/L — ABNORMAL HIGH (ref 20.0–28.0)
O2 Saturation: 26.4 %
Patient temperature: 37
pCO2, Ven: 47 mm[Hg] (ref 44–60)
pH, Ven: 7.41 (ref 7.25–7.43)

## 2023-07-28 LAB — SARS CORONAVIRUS 2 BY RT PCR: SARS Coronavirus 2 by RT PCR: NEGATIVE

## 2023-07-28 LAB — CBG MONITORING, ED: Glucose-Capillary: 81 mg/dL (ref 70–99)

## 2023-07-28 MED ORDER — MORPHINE SULFATE ER 15 MG PO TBCR
15.0000 mg | EXTENDED_RELEASE_TABLET | Freq: Two times a day (BID) | ORAL | Status: DC
  Administered 2023-07-28: 15 mg via ORAL
  Filled 2023-07-28: qty 1

## 2023-07-28 MED ORDER — MONTELUKAST SODIUM 10 MG PO TABS
10.0000 mg | ORAL_TABLET | Freq: Every day | ORAL | Status: DC
  Administered 2023-07-28 – 2023-07-29 (×2): 10 mg via ORAL
  Filled 2023-07-28 (×2): qty 1

## 2023-07-28 MED ORDER — METHYLPREDNISOLONE SODIUM SUCC 125 MG IJ SOLR
80.0000 mg | Freq: Once | INTRAMUSCULAR | Status: AC
  Administered 2023-07-28: 80 mg via INTRAVENOUS
  Filled 2023-07-28: qty 2

## 2023-07-28 MED ORDER — HYDRALAZINE HCL 10 MG PO TABS: 10.0000 mg | ORAL_TABLET | Freq: Four times a day (QID) | ORAL | Status: DC | PRN

## 2023-07-28 MED ORDER — POTASSIUM & SODIUM PHOSPHATES 280-160-250 MG PO PACK
1.0000 | PACK | Freq: Three times a day (TID) | ORAL | Status: DC
  Administered 2023-07-29 – 2023-07-30 (×5): 1 via ORAL
  Filled 2023-07-28 (×7): qty 1

## 2023-07-28 MED ORDER — METOCLOPRAMIDE HCL 5 MG/ML IJ SOLN
5.0000 mg | Freq: Once | INTRAMUSCULAR | Status: AC
  Administered 2023-07-28: 5 mg via INTRAVENOUS
  Filled 2023-07-28: qty 2

## 2023-07-28 MED ORDER — GABAPENTIN 400 MG PO CAPS
400.0000 mg | ORAL_CAPSULE | Freq: Every day | ORAL | Status: DC
  Administered 2023-07-28 – 2023-07-29 (×2): 400 mg via ORAL
  Filled 2023-07-28: qty 1
  Filled 2023-07-28: qty 4
  Filled 2023-07-28: qty 1

## 2023-07-28 MED ORDER — NICOTINE 21 MG/24HR TD PT24: 21.0000 mg | MEDICATED_PATCH | Freq: Every day | TRANSDERMAL | Status: DC | PRN

## 2023-07-28 MED ORDER — SODIUM CHLORIDE 0.9 % IV SOLN
500.0000 mg | Freq: Once | INTRAVENOUS | Status: AC
  Administered 2023-07-28: 500 mg via INTRAVENOUS
  Filled 2023-07-28: qty 5

## 2023-07-28 MED ORDER — ROSUVASTATIN CALCIUM 20 MG PO TABS
40.0000 mg | ORAL_TABLET | Freq: Every day | ORAL | Status: DC
  Administered 2023-07-29 – 2023-07-30 (×2): 40 mg via ORAL
  Filled 2023-07-28 (×2): qty 2

## 2023-07-28 MED ORDER — DONEPEZIL HCL 5 MG PO TABS
5.0000 mg | ORAL_TABLET | Freq: Every morning | ORAL | Status: DC
  Administered 2023-07-29 – 2023-07-30 (×2): 5 mg via ORAL
  Filled 2023-07-28 (×2): qty 1

## 2023-07-28 MED ORDER — BUTALBITAL-APAP-CAFFEINE 50-325-40 MG PO TABS: 1.0000 | ORAL_TABLET | Freq: Four times a day (QID) | ORAL | Status: AC | PRN

## 2023-07-28 MED ORDER — IPRATROPIUM-ALBUTEROL 0.5-2.5 (3) MG/3ML IN SOLN
3.0000 mL | Freq: Once | RESPIRATORY_TRACT | Status: AC
  Administered 2023-07-28: 3 mL via RESPIRATORY_TRACT
  Filled 2023-07-28: qty 3

## 2023-07-28 MED ORDER — CYANOCOBALAMIN 500 MCG PO TABS
500.0000 ug | ORAL_TABLET | Freq: Two times a day (BID) | ORAL | Status: DC
  Administered 2023-07-29 – 2023-07-30 (×3): 500 ug via ORAL
  Filled 2023-07-28 (×3): qty 1

## 2023-07-28 MED ORDER — ACETAMINOPHEN 325 MG PO TABS
650.0000 mg | ORAL_TABLET | Freq: Four times a day (QID) | ORAL | Status: DC | PRN
  Administered 2023-07-29 – 2023-07-30 (×4): 650 mg via ORAL
  Filled 2023-07-28 (×4): qty 2

## 2023-07-28 MED ORDER — ONDANSETRON HCL 4 MG PO TABS: 4.0000 mg | ORAL_TABLET | Freq: Four times a day (QID) | ORAL | Status: DC | PRN

## 2023-07-28 MED ORDER — SODIUM CHLORIDE 0.9 % IV SOLN
2.0000 g | INTRAVENOUS | Status: DC
  Administered 2023-07-29: 2 g via INTRAVENOUS
  Filled 2023-07-28: qty 20

## 2023-07-28 MED ORDER — ACETAMINOPHEN 650 MG RE SUPP: 650.0000 mg | Freq: Four times a day (QID) | RECTAL | Status: DC | PRN

## 2023-07-28 MED ORDER — ESCITALOPRAM OXALATE 10 MG PO TABS
20.0000 mg | ORAL_TABLET | Freq: Every day | ORAL | Status: DC
  Administered 2023-07-29 – 2023-07-30 (×2): 20 mg via ORAL
  Filled 2023-07-28 (×2): qty 2

## 2023-07-28 MED ORDER — NICOTINE POLACRILEX 2 MG MT GUM: 2.0000 mg | CHEWING_GUM | OROMUCOSAL | Status: DC | PRN

## 2023-07-28 MED ORDER — SODIUM CHLORIDE 0.9 % IV SOLN
500.0000 mg | INTRAVENOUS | Status: DC
  Administered 2023-07-29: 500 mg via INTRAVENOUS
  Filled 2023-07-28: qty 5

## 2023-07-28 MED ORDER — SODIUM CHLORIDE 0.9 % IV SOLN
2.0000 g | Freq: Once | INTRAVENOUS | Status: AC
  Administered 2023-07-28: 2 g via INTRAVENOUS
  Filled 2023-07-28: qty 20

## 2023-07-28 MED ORDER — OXYCODONE-ACETAMINOPHEN 5-325 MG PO TABS
2.0000 | ORAL_TABLET | Freq: Once | ORAL | Status: AC
  Administered 2023-07-28: 2 via ORAL
  Filled 2023-07-28: qty 2

## 2023-07-28 MED ORDER — KETOROLAC TROMETHAMINE 30 MG/ML IJ SOLN
15.0000 mg | Freq: Once | INTRAMUSCULAR | Status: AC
  Administered 2023-07-28: 15 mg via INTRAVENOUS
  Filled 2023-07-28: qty 1

## 2023-07-28 MED ORDER — LACTATED RINGERS IV BOLUS
1000.0000 mL | Freq: Once | INTRAVENOUS | Status: AC
  Administered 2023-07-28: 1000 mL via INTRAVENOUS

## 2023-07-28 MED ORDER — ENOXAPARIN SODIUM 40 MG/0.4ML IJ SOSY
40.0000 mg | PREFILLED_SYRINGE | INTRAMUSCULAR | Status: DC
  Administered 2023-07-28 – 2023-07-29 (×2): 40 mg via SUBCUTANEOUS
  Filled 2023-07-28 (×2): qty 0.4

## 2023-07-28 MED ORDER — SENNOSIDES-DOCUSATE SODIUM 8.6-50 MG PO TABS: 1.0000 | ORAL_TABLET | Freq: Every evening | ORAL | Status: DC | PRN

## 2023-07-28 MED ORDER — ONDANSETRON HCL 4 MG/2ML IJ SOLN
4.0000 mg | Freq: Four times a day (QID) | INTRAMUSCULAR | Status: DC | PRN
  Administered 2023-07-28: 4 mg via INTRAVENOUS
  Filled 2023-07-28: qty 2

## 2023-07-28 MED ORDER — FOLIC ACID 1 MG PO TABS
1.0000 mg | ORAL_TABLET | Freq: Every day | ORAL | Status: DC
  Administered 2023-07-29 – 2023-07-30 (×2): 1 mg via ORAL
  Filled 2023-07-28 (×2): qty 1

## 2023-07-28 MED ORDER — OXYCODONE-ACETAMINOPHEN 5-325 MG PO TABS
1.0000 | ORAL_TABLET | ORAL | Status: DC | PRN
  Administered 2023-07-29 – 2023-07-30 (×6): 1 via ORAL
  Filled 2023-07-28 (×6): qty 1

## 2023-07-28 NOTE — ED Notes (Signed)
Patient's daughter to nurses station reporting patient still having headache; MD Isaac's made aware.

## 2023-07-28 NOTE — Assessment & Plan Note (Signed)
Escitalopram 20 mg daily resumed

## 2023-07-28 NOTE — Assessment & Plan Note (Signed)
Home morphine 15 mg p.o. twice daily resumed Home oxycodone-acetaminophen 1 tablet, p.o., every 4 hours as needed for moderate pain resumed PDMP reviewed

## 2023-07-28 NOTE — Assessment & Plan Note (Signed)
-  As needed nicotine patch ordered ?

## 2023-07-28 NOTE — ED Notes (Signed)
Pt given urine cup and advised that urine sample is needed

## 2023-07-28 NOTE — Assessment & Plan Note (Signed)
Hydralazine 10 mg p.o. every 6 hours as needed for SBP greater than 170, 4 days ordered

## 2023-07-28 NOTE — ED Notes (Addendum)
Blood sent to lab

## 2023-07-28 NOTE — ED Notes (Signed)
Patient's daughter called [employer] operational officer for an update, provided patient a phone to call back. Patient states "I don't have an update to give but I'll call her when I do".

## 2023-07-28 NOTE — ED Notes (Signed)
Ambulated around room, pulse ox remained 99% but patient was complaining of dizziness. MD Erma Heritage made aware.

## 2023-07-28 NOTE — ED Triage Notes (Signed)
Pt comes via EMs from home with c/o increased weakness. Pt had covid and flu shot on Friday when symptoms started. Pt states he went to UC in [place] and got some kind of shot.   VSS

## 2023-07-28 NOTE — ED Provider Notes (Signed)
[facility] Provider Note    Event Date/Time   First MD Initiated Contact with Patient 07/28/23 1519     (approximate)   History   Weakness   HPI  [name] is a 77 y.o. male  with PMHx MDD, dementia (mild), COPD, bilateral lung CA, here with weakness. Pt reports that he just got over COVID earlier this month. He went to the [place] to see an MD for his lung cancer last week and got two shots - COVID and flu. Over the last 48 hours, he has had worsening cough, fatigue, chills, body aches, and nausea. He has had difficulty keeping any food/water down. He has had generalized headaches. Reports he feels very similar to how he did when he actually had COVID. He has been coughing more. No diarrhea.      Physical Exam   Triage Vital Signs: ED Triage Vitals  Encounter Vitals Group     BP 07/28/23 1201 120/71     Systolic BP Percentile --      Diastolic BP Percentile --      Pulse Rate 07/28/23 1201 76     Resp 07/28/23 1201 16     Temp 07/28/23 1201 98.5 F (36.9 C)     Temp Source 07/28/23 1201 Oral     SpO2 07/28/23 1201 96 %     Weight --      Height --      Head Circumference --      Peak Flow --      Pain Score 07/28/23 1200 0     Pain Loc --      Pain Education --      Exclude from Growth Chart --     Most recent vital signs: Vitals:   07/28/23 1600 07/28/23 1630  BP: (!) 143/63 (!) 152/67  Pulse: 62 60  Resp: 20   Temp: 98.1 F (36.7 C)   SpO2: 100%      General: Awake, no distress.  CV:  Good peripheral perfusion. RRR.  Resp:  Slight tachypnea with bilateral wheezes and diminished aeration. Abd:  No distention. No tenderness. Other:  No LE edema. Mildly dry MM.   ED Results / Procedures / Treatments   Labs (all labs ordered are listed, but only abnormal results are displayed) Labs Reviewed  CBC - Abnormal; Notable for the following components:      Result Value   RBC 3.76 (*)    Hemoglobin 12.1 (*)    HCT 36.3 (*)     All other components within normal limits  URINALYSIS, ROUTINE W REFLEX MICROSCOPIC - Abnormal; Notable for the following components:   Color, Urine AMBER (*)    APPearance HAZY (*)    Ketones, ur 80 (*)    Protein, ur 100 (*)    Bacteria, UA RARE (*)    All other components within normal limits  BLOOD GAS, VENOUS - Abnormal; Notable for the following components:   Bicarbonate 29.8 (*)    Acid-Base Excess 4.3 (*)    All other components within normal limits  SARS CORONAVIRUS 2 BY RT PCR  CULTURE, BLOOD (ROUTINE X 2)  CULTURE, BLOOD (ROUTINE X 2)  BASIC METABOLIC PANEL  CBG MONITORING, ED     EKG Normal sinus rhythm, VR 74. PR 122, QRS 82, QTc 499. No acute ST elevations or depressions. No ischemia or infarct.   RADIOLOGY CXR: Left lung base PNA   I also independently reviewed and agree  with radiologist interpretations.   PROCEDURES:  Critical Care performed: No  MEDICATIONS ORDERED IN ED: Medications  cefTRIAXone (ROCEPHIN) 2 g in sodium chloride 0.9 % 100 mL IVPB (has no administration in time range)  azithromycin (ZITHROMAX) 500 mg in sodium chloride 0.9 % 250 mL IVPB (has no administration in time range)  lactated ringers bolus 1,000 mL (0 mLs Intravenous Stopped 07/28/23 1803)  ipratropium-albuterol (DUONEB) 0.5-2.5 (3) MG/3ML nebulizer solution 3 mL (3 mLs Nebulization Given 07/28/23 1633)  ipratropium-albuterol (DUONEB) 0.5-2.5 (3) MG/3ML nebulizer solution 3 mL (3 mLs Nebulization Given 07/28/23 1632)  ipratropium-albuterol (DUONEB) 0.5-2.5 (3) MG/3ML nebulizer solution 3 mL (3 mLs Nebulization Given 07/28/23 1627)  methylPREDNISolone sodium succinate (SOLU-MEDROL) 125 mg/2 mL injection 80 mg (80 mg Intravenous Given 07/28/23 1604)  ketorolac (TORADOL) 30 MG/ML injection 15 mg (15 mg Intravenous Given 07/28/23 1603)  oxyCODONE-acetaminophen (PERCOCET/ROXICET) 5-325 MG per tablet 2 tablet (2 tablets Oral Given 07/28/23 1604)  metoCLOPramide (REGLAN) injection 5 mg  (5 mg Intravenous Given 07/28/23 1804)     IMPRESSION / MDM / ASSESSMENT AND PLAN / ED COURSE  I reviewed the triage vital signs and the nursing notes.                              Differential diagnosis includes, but is not limited to, PNA, URI, COVID-19, dehydration and fatigue from vaccination, anemia, ACS, CHF  Patient's presentation is most consistent with acute presentation with potential threat to life or bodily function.  The patient is on the cardiac monitor to evaluate for evidence of arrhythmia and/or significant heart rate changes  77 yo M with h/o lung CA, COPD, here with cough, weakness. Imaging shows LLL PNA and pt has moderate leukocytosis, +sputum production, diffuse wheezing and fatigue w/ nausea. Will tx for CAP and start steroids, breathing tx. UA is consistent with dehydration. BMP unremarkable., normal renal function. Blood gas without retention. COVID is negative.    FINAL CLINICAL IMPRESSION(S) / ED DIAGNOSES   Final diagnoses:  Community acquired pneumonia of left lower lobe of lung  COPD exacerbation (HCC)     Rx / DC Orders   ED Discharge Orders     None        Note:  This document was prepared using Dragon voice recognition software and may include unintentional dictation errors.   Shaune Pollack, MD 07/28/23 [phone]

## 2023-07-28 NOTE — Hospital Course (Signed)
Mr. [name] is a 77 year old male with history of dementia, CAD, hypertension, opioid pain dependence, depression, hyperlipidemia, COPD, history of lung mass, history of diverticulosis, tubular adenoma of the colon, history of lower GI bleed, who presents to the emergency department for chief concerns of weakness.  Vitals in the ED showed temperature of 98.1, respiration rate of 20, heart rate 62, blood pressure 143/63, SpO2 100% on room air.  Serum sodium is 136, potassium 4.1, chloride 99, bicarb 24, BUN of 18, serum creatinine of 0.74, EGFR greater than 60, nonfasting blood glucose 91, WBC 6.3, hemoglobin 12.1, platelets of 237.  UA was negative for leukocytes and nitrates.  COVID PCR was negative.  Chest x-ray: Was read as left lower lobe pneumonia.  ED treatment: DuoNeb x 3, Toradol 15 mg IV, Solu-Medrol 50 mg IV, Reglan 5 mg IV, Percocet 5-325 mg, 2 tablets p.o., LR 1 L bolus.

## 2023-07-28 NOTE — Assessment & Plan Note (Addendum)
New onset in patient older than 50 CT head w/o contrast ordered pending completion and read

## 2023-07-28 NOTE — Assessment & Plan Note (Signed)
Patient on Rocephin and Zithromax.  Procalcitonin negative.  Will send off viral respiratory panel.  With history of lung cancer in that left lower lobe this is what we could be seeing on the x-ray.

## 2023-07-28 NOTE — Assessment & Plan Note (Addendum)
PT evaluation.  Will MRI brain.

## 2023-07-28 NOTE — Assessment & Plan Note (Signed)
Mild dementia Resumed home donepezil 5 mg every morning

## 2023-07-28 NOTE — H&P (Addendum)
History and Physical   ELIGE STOVES WUJ:[id number] DOB: 24-Jun-1946 DOA: 07/28/2023  PCP: Barbette Reichmann, MD  Outpatient Specialists: Dr. Clifton James, cardiology Patient coming from: home  I have personally briefly reviewed patient's old medical records in [facility] EMR.  Chief Concern: Weakness, increased cough  HPI: Mr. [name] is a 77 year old male with history of dementia, CAD, hypertension, opioid pain dependence, depression, hyperlipidemia, COPD, history of lung mass, history of diverticulosis, tubular adenoma of the colon, history of lower GI bleed, who presents to the emergency department for chief concerns of weakness.  Vitals in the ED showed temperature of 98.1, respiration rate of 20, heart rate 62, blood pressure 143/63, SpO2 100% on room air.  Serum sodium is 136, potassium 4.1, chloride 99, bicarb 24, BUN of 18, serum creatinine of 0.74, EGFR greater than 60, nonfasting blood glucose 91, WBC 6.3, hemoglobin 12.1, platelets of 237.  UA was negative for leukocytes and nitrates.  COVID PCR was negative.  Chest x-ray: Was read as left lower lobe pneumonia.  ED treatment: DuoNeb x 3, Toradol 15 mg IV, Solu-Medrol 50 mg IV, Reglan 5 mg IV, Percocet 5-325 mg, 2 tablets p.o., LR 1 L bolus.  At bedside, he is able to tell me his name, age, current location, current calendar year.  He endorses subjective fever over the last two days. He endorses nausea and vomiting, 4-5 times, bile.  He reports the headache started about 2 days ago.  He states the headache is in the back of his head.  It is relieved with Toradol.  He developed a cough about two days ago. He reports increased productive cough of bright yellow. He has lost about 40 pounds in 6 months. He lost his appetite.   Social history: He lives on his own. He smokes 1/2 to 1 ppd. He denies etoh and recreational drug use. He is retired and formerly was in [employer]  ROS: Constitutional: + weight change, no  fever ENT/Mouth: no sore throat, no rhinorrhea Eyes: no eye pain, no vision changes Cardiovascular: no chest pain, no dyspnea,  no edema, no palpitations Respiratory: + cough, + sputum, no wheezing Gastrointestinal: no nausea, no vomiting, no diarrhea, no constipation Genitourinary: no urinary incontinence, no dysuria, no hematuria Musculoskeletal: no arthralgias, no myalgias Skin: no skin lesions, no pruritus, Neuro: + weakness, no loss of consciousness, no syncope Psych: no anxiety, no depression, + decrease appetite Heme/Lymph: no bruising, no bleeding  ED Course: Discussed with EDP, patient requiring hospitalization for weakness.   Assessment/Plan  Principal Problem:   Weakness Active Problems:   Headache   Left lower lobe pneumonia   Essential hypertension   Dementia without behavioral disturbance (HCC)   Tobacco user   Pure hypercholesterolemia   Major depressive disorder, recurrent, in full remission (HCC)   Narcotic dependency, continuous (HCC)   Depression   Assessment and Plan:  * Weakness Etiology workup in progress, left lower lobe pneumonia is a possibility however given patient's increased weight loss and new onset of headache, CT head without contrast has been ordered CT head is pending Fall precautions Admit to telemetry medical, inpatient  Left lower lobe pneumonia Azithromycin 500 mg IV daily, ceftriaxone 2 g IV daily to complete 5-day course Incentive spirometry, flutter valve every 1-2 hours while awake Nursing order: Please ensure incentive spirometry and flutter valve for within reach for patient Respiratory care team: Please educate patient on appropriate incentive spirometry and flutter valve use  Headache New onset in patient older than  50 CT head w/o contrast ordered pending completion and read  Dementia without behavioral disturbance (HCC) Mild dementia Resumed home donepezil 5 mg every morning  Essential hypertension Hydralazine 10 mg  p.o. every 6 hours as needed for SBP greater than 170, 4 days ordered  Depression Escitalopram 20 mg daily resumed  Narcotic dependency, continuous (HCC) Home morphine 15 mg p.o. twice daily resumed Home oxycodone-acetaminophen 1 tablet, p.o., every 4 hours as needed for moderate pain resumed PDMP reviewed  Pure hypercholesterolemia Home rosuvastatin 40 mg daily resumed  Tobacco user As needed nicotine patch ordered  Chart reviewed.   DVT prophylaxis: Enoxaparin 40 mg subcutaneous every 24 hours Code Status: Full code, patient wants resuscitation attempt only one time. [name] is patient's POA Diet: Heart healthy Family Communication: Phone call offered, paitent declined  Disposition Plan: pending CT head and clinical course Consults called: none at this time Admission status: telemetry medical, inpatient  Past Medical History:  Diagnosis Date   Anemia    Arthritis    Back pain    scoliosis   CAD (coronary artery disease) 03/24/2011   - S/p stent to RCA in 1999  - Cath in 2019 with patent stent and mod nonobstructive disease elsewhere  - Myoview 06/05/23: EF 61, normal perfusion, low risk     COPD (chronic obstructive pulmonary disease) (HCC)    Depression    Diverticulosis    GERD (gastroesophageal reflux disease)    GI bleed    History of colon polyps    History of hiatal hernia    Hyperkalemia    Hyperlipidemia    Hypertension    Lung cancer (HCC) 06/04/2023   Radiation Rx; [facility]    Scoliosis    Tobacco abuse    Past Surgical History:  Procedure Laterality Date   APPENDECTOMY  1957   BACK SURGERY     CARDIAC CATHETERIZATION  1999   stent placement RCA   CARDIAC CATHETERIZATION  2010   mild left CA distal stenosis, mild irregularities in RCA and LAD. patent setn in RCA   COLONOSCOPY WITH PROPOFOL N/A 06/16/2018   Procedure: COLONOSCOPY WITH PROPOFOL;  Surgeon: Toledo, Boykin Nearing, MD;  Location: [facility] ENDOSCOPY;  Service:  Gastroenterology;  Laterality: N/A;   CORONARY ANGIOPLASTY     CORONARY ARTERY BYPASS GRAFT     denies 09/30/19   ESOPHAGOGASTRODUODENOSCOPY (EGD) WITH PROPOFOL N/A 06/16/2018   Procedure: ESOPHAGOGASTRODUODENOSCOPY (EGD) WITH PROPOFOL;  Surgeon: Toledo, Boykin Nearing, MD;  Location: [facility] ENDOSCOPY;  Service: Gastroenterology;  Laterality: N/A;   FOOT SURGERY  1973   left   LEFT HEART CATH AND CORONARY ANGIOGRAPHY N/A 01/22/2018   Procedure: LEFT HEART CATH AND CORONARY ANGIOGRAPHY;  Surgeon: Kathleene Hazel, MD;  Location: [facility] INVASIVE CV LAB;  Service: Cardiovascular;  Laterality: N/A;   LEFT HEART CATHETERIZATION WITH CORONARY ANGIOGRAM N/A 01/11/2015   Procedure: LEFT HEART CATHETERIZATION WITH CORONARY ANGIOGRAM;  Surgeon: Kathleene Hazel, MD;  Location: [facility] CATH LAB;  Service: Cardiovascular;  Laterality: N/A;   OLECRANON BURSECTOMY Right 10/04/2019   Procedure: OLECRANON BURSA;  Surgeon: Signa Kell, MD;  Location: [facility] ORS;  Service: Orthopedics;  Laterality: Right;   TRICEPS TENDON REPAIR Right 10/04/2019   Procedure: TRICEPS TENDON REPAIR AND PLECRANON BURSA EXCISION, POSSIBLE ALLOGRAFT AUGMENTATION.;  Surgeon: Signa Kell, MD;  Location: [facility] ORS;  Service: Orthopedics;  Laterality: Right;   Social History:  reports that he has been smoking cigarettes. He has a 17.5 pack-year smoking history. He has never  used smokeless tobacco. He reports current alcohol use of about 6.0 standard drinks of alcohol per week. He reports that he does not use drugs.  Allergies  Allergen Reactions   Levofloxacin Itching and Rash   Doxycycline Nausea And Vomiting   Family History  Problem Relation Age of Onset   Alzheimer's disease Father    Angina Father    Diabetes Cousin        mothers side   Heart attack Cousin        fathers side   Hyperlipidemia Cousin        fathers side   Hypertension Cousin        fathers side   Family history: Family history reviewed and not  pertinent.  Prior to Admission medications   Medication Sig Start Date End Date Taking? Authorizing Provider  aspirin EC 81 MG tablet Take 1 tablet (81 mg total) by mouth daily. 02/24/18  Yes Robbie Lis M, PA-C  cyanocobalamin 500 MCG tablet Take 500 mcg by mouth 2 (two) times daily. VITAMIN B-12 08/18/14  Yes [provider]  donepezil (ARICEPT) 10 MG tablet TAKE ONE-HALF TABLET BY MOUTH EVERY DAY FOR DEMENTIA 08/31/21  Yes [provider]  escitalopram (LEXAPRO) 20 MG tablet Take 1 tablet (20 mg total) by mouth daily. 04/12/14  Yes Kathleene Hazel, MD  folic acid (FOLVITE) 1 MG tablet Take 1 mg by mouth daily. 10/01/21  Yes [provider]  gabapentin (NEURONTIN) 400 MG capsule Take 400 mg by mouth at bedtime.    Yes [provider]  montelukast (SINGULAIR) 10 MG tablet TAKE 1 TABLET AT BEDTIME 04/28/20  Yes Scarboro, Coralee North, NP  morphine (MS CONTIN) 15 MG 12 hr tablet Take 15 mg by mouth 2 (two) times daily.   Yes [provider]  rosuvastatin (CRESTOR) 40 MG tablet Take 1 tablet (40 mg total) by mouth daily. 06/04/23  Yes Tereso Newcomer T, PA-C  Vitamin D, Ergocalciferol, (DRISDOL) 50000 UNITS CAPS capsule Take 50,000 Units by mouth every Wednesday.  12/15/14  Yes [provider]  oxyCODONE-acetaminophen (PERCOCET) 5-325 MG per tablet Take 1 tablet by mouth every 4 (four) hours as needed for moderate pain. For pain    [provider]  potassium & sodium phosphates (PHOS-NAK) 280-160-250 MG PACK Take 1 packet by mouth 4 (four) times daily -  with meals and at bedtime. 06/20/23   Marrion Coy, MD   Physical Exam: Vitals:   07/28/23 1630 07/28/23 1830 07/28/23 1956 07/28/23 2020  BP: (!) 152/67 (!) 142/51 123/76 (!) 140/60  Pulse: 60 73 74 76  Resp:  18 17 18   Temp:   98.4 F (36.9 C) 98.1 F (36.7 C)  TempSrc:   Oral   SpO2:  99% 100% 95%  Weight:    55.5 kg  Height:    5\' 8"  (1.727 m)   Constitutional: appears  older than chronological age, frail, cachectic  Eyes: PERRL, lids and conjunctivae normal ENMT: Mucous membranes are moist. Posterior pharynx clear of any exudate or lesions. Age-appropriate dentition. Hearing appropriate Neck: normal, supple, no masses, no thyromegaly Respiratory: clear to auscultation bilaterally, no wheezing, no crackles. Normal respiratory effort. No accessory muscle use.  Cardiovascular: Regular rate and rhythm, no murmurs / rubs / gallops. No extremity edema. 2+ pedal pulses. No carotid bruits.  Abdomen: no tenderness, no masses palpated, no hepatosplenomegaly. Bowel sounds positive.  Musculoskeletal: no clubbing / cyanosis. No joint deformity upper and lower extremities. Good ROM, no contractures,  no atrophy. Normal muscle tone.  Skin: no rashes, lesions, ulcers. No induration Neurologic: Sensation intact. Strength 5/5 in all 4.  Psychiatric: Normal judgment and insight. Alert and oriented x 3. Normal mood.   EKG: independently reviewed, showing sinus rhythm with rate of 74, QTc 499  Chest x-ray on Admission: I personally reviewed and I agree with radiologist reading as below.  DG Chest 2 View  Result Date: 07/28/2023 CLINICAL DATA:  Weakness and fatigue. EXAM: CHEST - 2 VIEW COMPARISON:  Chest radiograph dated 06/17/2023. FINDINGS: Faint diffuse interstitial coarsening and nodularity, improved since the prior radiograph. Left lung base density concerning for pneumonia. Clinical correlation and follow-up to resolution recommended. Trace left pleural effusion suspected. No pneumothorax. Stable mild cardiomegaly. Atherosclerotic calcification of the aorta. No acute osseous pathology. IMPRESSION: Left lung base density concerning for pneumonia. Clinical correlation and follow-up to resolution recommended. Electronically Signed   By: Elgie Collard M.D.   On: 07/28/2023 18:28    Labs on Admission: I have personally reviewed following labs  CBC: Recent Labs  Lab  07/28/23 1203  WBC 6.3  HGB 12.1*  HCT 36.3*  MCV 96.5  PLT 237   Basic Metabolic Panel: Recent Labs  Lab 07/28/23 1203  NA 136  K 4.1  CL 99  CO2 24  GLUCOSE 91  BUN 18  CREATININE 0.74  CALCIUM 9.2   GFR: Estimated Creatinine Clearance: 58.8 mL/min (by C-G formula based on SCr of 0.74 mg/dL).  CBG: Recent Labs  Lab 07/28/23 1221  GLUCAP 81   Urine analysis:    Component Value Date/Time   COLORURINE AMBER (A) 07/28/2023 1217   APPEARANCEUR HAZY (A) 07/28/2023 1217   LABSPEC 1.029 07/28/2023 1217   PHURINE 5.0 07/28/2023 1217   GLUCOSEU NEGATIVE 07/28/2023 1217   HGBUR NEGATIVE 07/28/2023 1217   BILIRUBINUR NEGATIVE 07/28/2023 1217   KETONESUR 80 (A) 07/28/2023 1217   PROTEINUR 100 (A) 07/28/2023 1217   NITRITE NEGATIVE 07/28/2023 1217   LEUKOCYTESUR NEGATIVE 07/28/2023 1217   This document was prepared using Dragon Voice Recognition software and may include unintentional dictation errors.  Dr. Sedalia Muta [facility]  If 7PM-7AM, please contact overnight-coverage provider If 7AM-7PM, please contact day attending provider [email or web address]  07/28/2023, 10:49 PM

## 2023-07-28 NOTE — Assessment & Plan Note (Signed)
Home rosuvastatin 40 mg daily resumed

## 2023-07-29 ENCOUNTER — Inpatient Hospital Stay: Payer: Medicare Other

## 2023-07-29 DIAGNOSIS — E871 Hypo-osmolality and hyponatremia: Secondary | ICD-10-CM | POA: Insufficient documentation

## 2023-07-29 DIAGNOSIS — F0390 Unspecified dementia without behavioral disturbance: Secondary | ICD-10-CM

## 2023-07-29 DIAGNOSIS — R519 Headache, unspecified: Secondary | ICD-10-CM | POA: Diagnosis not present

## 2023-07-29 DIAGNOSIS — J189 Pneumonia, unspecified organism: Secondary | ICD-10-CM | POA: Diagnosis not present

## 2023-07-29 DIAGNOSIS — F3342 Major depressive disorder, recurrent, in full remission: Secondary | ICD-10-CM

## 2023-07-29 DIAGNOSIS — F1120 Opioid dependence, uncomplicated: Secondary | ICD-10-CM

## 2023-07-29 DIAGNOSIS — J441 Chronic obstructive pulmonary disease with (acute) exacerbation: Secondary | ICD-10-CM | POA: Diagnosis not present

## 2023-07-29 DIAGNOSIS — E43 Unspecified severe protein-calorie malnutrition: Secondary | ICD-10-CM | POA: Insufficient documentation

## 2023-07-29 DIAGNOSIS — Z720 Tobacco use: Secondary | ICD-10-CM

## 2023-07-29 DIAGNOSIS — C3492 Malignant neoplasm of unspecified part of left bronchus or lung: Secondary | ICD-10-CM | POA: Diagnosis not present

## 2023-07-29 DIAGNOSIS — E7800 Pure hypercholesterolemia, unspecified: Secondary | ICD-10-CM

## 2023-07-29 LAB — BASIC METABOLIC PANEL
Anion gap: 9 (ref 5–15)
BUN: 20 mg/dL (ref 8–23)
CO2: 25 mmol/L (ref 22–32)
Calcium: 8.9 mg/dL (ref 8.9–10.3)
Chloride: 100 mmol/L (ref 98–111)
Creatinine, Ser: 0.72 mg/dL (ref 0.61–1.24)
GFR, Estimated: >60 mL/min (ref >60)
Glucose, Bld: 149 mg/dL — ABNORMAL HIGH (ref 70–99)
Potassium: 3.5 mmol/L (ref 3.5–5.1)
Sodium: 134 mmol/L — ABNORMAL LOW (ref 135–145)

## 2023-07-29 LAB — CBC
HCT: 31.6 % — ABNORMAL LOW (ref 39.0–52.0)
Hemoglobin: 10.6 g/dL — ABNORMAL LOW (ref 13.0–17.0)
MCH: 32.2 pg (ref 26.0–34.0)
MCHC: 33.5 g/dL (ref 30.0–36.0)
MCV: 96 fL (ref 80.0–100.0)
Platelets: 202 10*3/uL (ref 150–400)
RBC: 3.29 MIL/uL — ABNORMAL LOW (ref 4.22–5.81)
RDW: 15.3 % (ref 11.5–15.5)
WBC: 4.4 10*3/uL (ref 4.0–10.5)
nRBC: 0 % (ref 0.0–0.2)

## 2023-07-29 LAB — PROCALCITONIN: Procalcitonin: <0.1 ng/mL

## 2023-07-29 MED ORDER — MORPHINE SULFATE ER 15 MG PO TBCR
15.0000 mg | EXTENDED_RELEASE_TABLET | Freq: Two times a day (BID) | ORAL | Status: DC
  Administered 2023-07-29 – 2023-07-30 (×3): 15 mg via ORAL
  Filled 2023-07-29 (×3): qty 1

## 2023-07-29 MED ORDER — IPRATROPIUM-ALBUTEROL 0.5-2.5 (3) MG/3ML IN SOLN
3.0000 mL | Freq: Four times a day (QID) | RESPIRATORY_TRACT | Status: DC
  Administered 2023-07-29: 3 mL via RESPIRATORY_TRACT
  Filled 2023-07-29: qty 3

## 2023-07-29 MED ORDER — BUDESONIDE 0.25 MG/2ML IN SUSP
0.2500 mg | Freq: Two times a day (BID) | RESPIRATORY_TRACT | Status: DC
  Administered 2023-07-29 – 2023-07-30 (×2): 0.25 mg via RESPIRATORY_TRACT
  Filled 2023-07-29 (×2): qty 2

## 2023-07-29 MED ORDER — ENSURE ENLIVE PO LIQD
237.0000 mL | Freq: Three times a day (TID) | ORAL | Status: DC
  Administered 2023-07-30 (×2): 237 mL via ORAL

## 2023-07-29 MED ORDER — METHYLPREDNISOLONE SODIUM SUCC 40 MG IJ SOLR
40.0000 mg | Freq: Every day | INTRAMUSCULAR | Status: DC
  Administered 2023-07-29 – 2023-07-30 (×2): 40 mg via INTRAVENOUS
  Filled 2023-07-29 (×2): qty 1

## 2023-07-29 MED ORDER — IPRATROPIUM-ALBUTEROL 0.5-2.5 (3) MG/3ML IN SOLN
RESPIRATORY_TRACT | Status: AC
  Filled 2023-07-29: qty 3

## 2023-07-29 MED ORDER — IPRATROPIUM-ALBUTEROL 0.5-2.5 (3) MG/3ML IN SOLN
3.0000 mL | Freq: Two times a day (BID) | RESPIRATORY_TRACT | Status: DC
  Administered 2023-07-29: 3 mL via RESPIRATORY_TRACT
  Filled 2023-07-29: qty 3

## 2023-07-29 MED ORDER — KETOROLAC TROMETHAMINE 15 MG/ML IJ SOLN
15.0000 mg | Freq: Once | INTRAMUSCULAR | Status: AC
  Administered 2023-07-29: 15 mg via INTRAVENOUS
  Filled 2023-07-29: qty 1

## 2023-07-29 MED ORDER — ADULT MULTIVITAMIN W/MINERALS CH
1.0000 | ORAL_TABLET | Freq: Every day | ORAL | Status: DC
  Administered 2023-07-30: 1 via ORAL
  Filled 2023-07-29 (×2): qty 1

## 2023-07-29 MED ORDER — GADOBUTROL 1 MMOL/ML IV SOLN
5.0000 mL | Freq: Once | INTRAVENOUS | Status: AC | PRN
  Administered 2023-07-29: 5 mL via INTRAVENOUS

## 2023-07-29 NOTE — Assessment & Plan Note (Signed)
Hemoglobin 10.6.  Check ferritin tomorrow.

## 2023-07-29 NOTE — Plan of Care (Signed)
Problem: Education: Goal: Knowledge of risk factors and measures for prevention of condition will improve Outcome: Progressing   Problem: Respiratory: Goal: Will maintain a patent airway Outcome: Progressing Goal: Complications related to the disease process, condition or treatment will be avoided or minimized Outcome: Progressing   Problem: Education: Goal: Knowledge of General Education information will improve Description: Including pain rating scale, medication(s)/side effects and non-pharmacologic comfort measures Outcome: Progressing

## 2023-07-29 NOTE — Assessment & Plan Note (Signed)
Sodium 1 point less than the normal range.

## 2023-07-29 NOTE — Evaluation (Signed)
Occupational Therapy Evaluation Patient Details Name: [name] MRN: [id number] DOB: December 28, 1945 Today's Date: 07/29/2023   History of Present Illness [name] is a 77 year old male with history of dementia, CAD, hypertension, opioid pain dependence, depression, hyperlipidemia, COPD, history of lung mass, history of diverticulosis, tubular adenoma of the colon, history of lower GI bleed, who presents to the emergency department for chief concerns of weakness.   Clinical Impression   [name] was seen for OT evaluation this date. Prior to hospital admission, pt was IND. Pt lives alone. Pt currently requires SBA for ADL t/f and standing grooming tasks. Noted 1 lateral LOB with turns, corrected with stagger step.  MOD I don B socks seated EOB. Reports back pain and headache limiting tolerance, states RN aware. Educated on flutter valve and IS frequency and use. Pt would benefit from skilled OT to address noted impairments and functional limitations (see below for any additional details). Upon hospital discharge, recommend OT follow up and use of RW for safety.    If plan is discharge home, recommend the following: A little help with walking and/or transfers;Help with stairs or ramp for entrance    Functional Status Assessment  Patient has had a recent decline in their functional status and demonstrates the ability to make significant improvements in function in a reasonable and predictable amount of time.  Equipment Recommendations  BSC/3in1    Recommendations for Other Services       Precautions / Restrictions Precautions Precautions: Fall Restrictions Weight Bearing Restrictions: No      Mobility Bed Mobility Overal bed mobility: Modified Independent             General bed mobility comments: increased time    Transfers Overall transfer level: Needs assistance Equipment used: None Transfers: Sit to/from Stand Sit to Stand: Supervision                   Balance Overall balance assessment: Needs assistance Sitting-balance support: No upper extremity supported, Feet supported Sitting balance-Leahy Scale: Normal     Standing balance support: No upper extremity supported, During functional activity Standing balance-Leahy Scale: Fair Standing balance comment: 1 lateral LOB with turns                           ADL either performed or assessed with clinical judgement   ADL Overall ADL's : Needs assistance/impaired                                       General ADL Comments: SBA for ADL t/f and standing grooming tasks. MOD I don B socks seated EOB.      Pertinent Vitals/Pain Pain Assessment Pain Assessment: Faces Faces Pain Scale: Hurts even more Pain Location: headache, back Pain Descriptors / Indicators: Dull, Grimacing, Headache Pain Intervention(s): Limited activity within patient's tolerance, Repositioned     Extremity/Trunk Assessment Upper Extremity Assessment Upper Extremity Assessment: Overall WFL for tasks assessed   Lower Extremity Assessment Lower Extremity Assessment: Generalized weakness       Communication Communication Communication: No apparent difficulties   Cognition Arousal: Alert Behavior During Therapy: WFL for tasks assessed/performed Overall Cognitive Status: Within Functional Limits for tasks assessed  Home Living Family/patient expects to be discharged to:: Private residence Living Arrangements: Alone Available Help at Discharge: Family;Available PRN/intermittently Type of Home: House Home Access: Stairs to enter [employer] of Steps: 3 Entrance Stairs-Rails: None Home Layout: Laundry or work area in basement     [employer] Shower/Tub: Chief Strategy Officer: Standard     Home Equipment: Agricultural consultant (2 wheels);Cane - single point   Additional Comments: daughter  nearby      Prior Functioning/Environment Prior Level of Function : Independent/Modified Independent;Driving             Mobility Comments: reports does not need to use AD          OT Problem List: Decreased activity tolerance;Impaired balance (sitting and/or standing)      OT Treatment/Interventions: Self-care/ADL training;Therapeutic exercise;Energy conservation;DME and/or AE instruction;Therapeutic activities;Balance training;Patient/family education    OT Goals(Current goals can be found in the care plan section) Acute Rehab OT Goals Patient Stated Goal: to go home OT Goal Formulation: With patient Time For Goal Achievement: 08/12/23 Potential to Achieve Goals: Good ADL Goals Pt Will Perform Grooming: Independently;standing Pt Will Perform Lower Body Dressing: Independently;sit to/from stand Pt Will Transfer to Toilet: Independently;ambulating;regular height toilet  OT Frequency: Min 1X/week    Co-evaluation              AM-PAC OT "6 Clicks" Daily Activity     Outcome Measure Help from another person eating meals?: None Help from another person taking care of personal grooming?: A Little Help from another person toileting, which includes using toliet, bedpan, or urinal?: A Little Help from another person bathing (including washing, rinsing, drying)?: A Little Help from another person to put on and taking off regular upper body clothing?: None Help from another person to put on and taking off regular lower body clothing?: A Little 6 Click Score: 20   End of Session    Activity Tolerance: Patient tolerated treatment well Patient left: in bed;with call bell/phone within reach  OT Visit Diagnosis: Other abnormalities of gait and mobility (R26.89);Muscle weakness (generalized) (M62.81)                Time: 7829-5621 OT Time Calculation (min): 18 min Charges:  OT General Charges $OT Visit: 1 Visit OT Evaluation $OT Eval Moderate Complexity: 1 Mod  Kathie Dike, M.S. OTR/L  07/29/23, 9:30 AM  ascom [phone]

## 2023-07-29 NOTE — Progress Notes (Signed)
Initial Nutrition Assessment  DOCUMENTATION CODES:   Severe malnutrition in context of chronic illness  INTERVENTION:   -Ensure Enlive po TID, each supplement provides 350 kcal and 20 grams of protein -MVI with minerals daily -Liberalize diet to regular for wider variety of meal selections  NUTRITION DIAGNOSIS:   Severe Malnutrition related to chronic illness (dementia) as evidenced by severe fat depletion, severe muscle depletion, percent weight loss.  GOAL:   Patient will meet greater than or equal to 90% of their needs  MONITOR:   PO intake, Supplement acceptance  REASON FOR ASSESSMENT:   Malnutrition Screening Tool    ASSESSMENT:   Pt with history of dementia, CAD, hypertension, opioid pain dependence, depression, hyperlipidemia, COPD, history of lung mass, history of diverticulosis, tubular adenoma of the colon, history of lower GI bleed, who presents for chief concerns of weakness.  Pt admitted with weakness, LLE pneumonia, and dementia.   Reviewed I/O's: +700 ml x 24 hours  UOP: 300 ml x 24 hours  Spoke with pt at bedside, who was pleasant and in good spirits today. He reports feeling better after admission and having the ability to rest. He reports poor appetite at baseline, however, this has worsened over the past 2 weeks due to side effects from his COVID vaccine and flu shot.  Pt also shares that due to chronic back pain, it can be difficult for him to get out of bed in order to prepare meals.   Pt is currently on a heart healthy diet. Noted meal completions 50%. Pt shares that the first meal he has had in the past few days was dinner yesterday, , which he consumed most of. Pt with multiple missing teeth, but does not use dentures; he denies any difficult chewing or swallowing foods- "I could eat a steak if I wanted to".   Pt endorses significant wt loss over the past 6 months. He shares his UBW is around 165# and has lost about 40# over this time periods and  about 40 pounds over the past month. Reviewed wt hx; pt has experienced a 19.2% wt loss over the past 7 months, which is significant for time frame.   Discussed importance of good meal and supplement intake to promote healing. Pt likes Ensure and is willing to continue to drink them.   Medications reviewed and include vitamin B-12, folic acid, neurontin, morphine, and sodium and potassium phosphates.   Labs reviewed. Na: 134.    NUTRITION - FOCUSED PHYSICAL EXAM:  Flowsheet Row Most Recent Value  Orbital Region Severe depletion  Upper Arm Region Severe depletion  Thoracic and Lumbar Region Severe depletion  Buccal Region Severe depletion  Temple Region Severe depletion  Clavicle Bone Region Severe depletion  Clavicle and Acromion Bone Region Severe depletion  Scapular Bone Region Severe depletion  Dorsal Hand Severe depletion  Patellar Region Severe depletion  Posterior Calf Region Severe depletion  Edema (RD Assessment) None  Hair Reviewed  Eyes Reviewed  Mouth Reviewed  Skin Reviewed  Nails Reviewed       Diet Order:   Diet Order             Diet regular Fluid consistency: Thin  Diet effective now                   EDUCATION NEEDS:   Education needs have been addressed  Skin:  Skin Assessment: Reviewed RN Assessment  Last BM:  07/28/23  Height:   Ht Readings from Last 1 Encounters:  07/28/23 5\' 8"  (1.727 m)    Weight:   Wt Readings from Last 1 Encounters:  07/28/23 55.5 kg    Ideal Body Weight:  70 kg  BMI:  Body mass index is 18.6 kg/m.  Estimated Nutritional Needs:   Kcal:  2000-2200  Protein:  100-115 grams  Fluid:  > 2 L    Levada Schilling, RD, LDN, CDCES Registered Dietitian III Certified Diabetes Care and Education Specialist Please refer to [facility] for RD and/or RD on-call/weekend/after hours pager

## 2023-07-29 NOTE — Assessment & Plan Note (Signed)
Continue Solu-Medrol 40 mg daily and nebulizer treatments.

## 2023-07-29 NOTE — TOC Initial Note (Addendum)
Transition of Care [facility]) - Initial/Assessment Note    Patient Details  Name: [name] MRN: [id number] Date of Birth: Jul 08, 1946  [facility]) CM/SW Contact:    Allena Katz, LCSW Phone Number: 07/29/2023, 3:38 PM  Clinical Narrative:    Pt lives at home alone. He reports he has a walker at home. Pt reports he didn't have HH prior to coming in but feels that will be helpful. Pt is able to drive and has a way to appointments. Pt is active with Dr. Marcello Fennel and reports he had an appt with him tomorrow but will need to cancel it. Pt reports he would like a handheld nebulizer for home use.   Nebulizer ordered through [facility] for home use. Pt was [facility] with [facility] prior to admission but they did not admit as pt refused. Pt is willing to try [facility] again and would like to use them. Referral given to [facility] with [facility].              Expected Discharge Plan: Home w Home Health Services Barriers to Discharge: Continued Medical Work up   Patient Goals and CMS Choice Patient states their goals for this hospitalization and ongoing recovery are:: return home CMS Medicare.gov Compare Post Acute Care list provided to:: Patient Choice offered to / list presented to : Patient      Expected Discharge Plan and Services                                              Prior Living Arrangements/Services   Lives with:: Self Patient language and need for interpreter reviewed:: Yes Do you feel safe going back to the place where you live?: Yes               Activities of Daily Living   ADL Screening (condition at time of admission) Independently performs ADLs?: Yes (appropriate for developmental age) Is the patient deaf or have difficulty hearing?: No Does the patient have difficulty seeing, even when wearing glasses/contacts?: No (Eye implant) Does the patient have difficulty concentrating, remembering, or making decisions?: No  Permission  Sought/Granted                  Emotional Assessment       Orientation: : Oriented to Self, Oriented to Place, Oriented to  Time, Oriented to Situation      Admission diagnosis:  Weakness [R53.1] COPD exacerbation (HCC) [J44.1] Community acquired pneumonia of left lower lobe of lung [J18.9] Patient Active Problem List   Diagnosis Date Noted   Hyponatremia 07/29/2023   Weakness 07/28/2023   Headache 07/28/2023   Left lower lobe pneumonia 07/28/2023   Hypophosphatemia 06/19/2023   Gastroenteritis due to COVID-19 virus 06/17/2023   Encephalopathy due to COVID-19 virus 06/17/2023   Lung cancer (HCC) 06/04/2023   Preoperative clearance 06/04/2023   Multiple fractures of ribs, left side, initial encounter for closed fracture 01/03/2023   Mediastinal adenopathy 01/03/2023   Aspiration pneumonia (HCC) 01/02/2023   Dementia without behavioral disturbance (HCC) 01/02/2023   GERD without esophagitis 01/02/2023   COPD exacerbation (HCC) 01/02/2023   Alcohol withdrawal delirium (HCC) 01/11/2020   Lobar pneumonia, unspecified organism (HCC) 01/11/2020   Acute respiratory failure with hypoxia (HCC) 01/11/2020   Acute pulmonary edema (HCC) 01/11/2020   Toxic metabolic encephalopathy 01/11/2020   Anemia 01/11/2020  Narcotic dependency, continuous (HCC) 01/11/2020   Centrilobular emphysema (HCC) 02/13/2017   Chronic bilateral low back pain with right-sided sciatica 02/13/2017   Gastroesophageal reflux disease 02/13/2017   Major depressive disorder, recurrent, in full remission (HCC) 02/13/2017   CAD (coronary artery disease) 03/24/2011   Tobacco user 03/24/2011   Pure hypercholesterolemia 03/24/2011   PCP:  Barbette Reichmann, MD Pharmacy:   [facility] DELIVERY - [address] [place] - [address] [place] [address] Phone: [phone] Fax: [phone]  [facility] [address] [place], [place] - [address] AT [facility] [address] Jacqlyn Krauss [facility] [place] [address] Phone: [phone] Fax: [phone]  [facility] - [facility] [address] [place] [address] Phone: [phone] Fax: [phone]     Social Determinants of Health (SDOH) Social History: SDOH Screenings   Food Insecurity: No Food Insecurity (07/28/2023)  Housing: Low Risk  (07/28/2023)  Transportation Needs: No Transportation Needs (07/28/2023)  Utilities: Not At Risk (07/28/2023)  Financial Resource Strain: Low Risk  (01/17/2023)   [facility] from [facility]  Tobacco Use: High Risk (07/28/2023)   SDOH Interventions:     Readmission Risk Interventions    07/29/2023    3:36 PM  Readmission Risk Prevention Plan  Transportation Screening Complete  Medication Review (RN Care Manager) Complete  PCP or Specialist appointment within 3-5 days of discharge Complete  SW Recovery Care/Counseling Consult Complete  Skilled Nursing Facility Complete

## 2023-07-29 NOTE — Evaluation (Signed)
Physical Therapy Evaluation Patient Details Name: [name] MRN: [id number] DOB: 14-Feb-1946 Today's Date: 07/29/2023  History of Present Illness  [name] is a 77 year old male with history of dementia, CAD, hypertension, opioid pain dependence, depression, hyperlipidemia, COPD, history of lung mass, history of diverticulosis, tubular adenoma of the colon, history of lower GI bleed, who presents to the emergency department for chief concerns of weakness; admitted for management of LLE PNA  Clinical Impression  Patient seated indep at edge of bed upon arrival to session; alert and oriented, follows commands and agreeable to participation with treatment session.  Denies acute pain, but does endorse generalized soreness due to acute illness.  Bilat UE/LE strength and ROM grossly symmetrical and WFL; no focal weakness appreciated.  Able to complete bed mobility indep; sit/stand, standing balance, basic transfers and gait (240') without assist device, close sup; up/down stairs (6-7) with single rail, cga/close sup.  Gait pattern significant for reciprocal stepping pattern with fair step height/length; forward flexed, kyphotic posture; broad, choppy BOS, but no buckling or LOB. Mild sway/deviations with head turns, but self-corrects without difficulty.  Fair cadence (10' walk time, 7-8 seconds), indicative of household, limited community ambulator. Mild desat to 87-88% on Matthew with gait efforts; quickly recovers >90% with 10-20 seconds of seated rest and pursed lip breathing.  Reinforced role of IS as introduced by OT; patient voiced understanding. Would benefit from skilled PT to address above deficits and promote optimal return to PLOF.; recommend post-acute PT follow up as indicated by interdisciplinary care team.            If plan is discharge home, recommend the following: A little help with walking and/or transfers;A little help with bathing/dressing/bathroom   Can travel by private vehicle         Equipment Recommendations    Recommendations for Other Services       Functional Status Assessment Patient has had a recent decline in their functional status and demonstrates the ability to make significant improvements in function in a reasonable and predictable amount of time.     Precautions / Restrictions Precautions Precautions: Fall Restrictions Weight Bearing Restrictions: No      Mobility  Bed Mobility Overal bed mobility: Modified Independent                  Transfers Overall transfer level: Modified independent Equipment used: None Transfers: Sit to/from Stand Sit to Stand: Supervision                Ambulation/Gait Ambulation/Gait assistance: Supervision Gait Distance (Feet): 240 Feet Assistive device: None   Gait velocity: 10' walk time, 7-8 seconds     General Gait Details: reciprocal stepping pattern with fair step height/length; forward flexed, kyphotic posture; broad, choppy BOS, but no buckling or LOB. Mild sway/deviations with head turns, but self-corrects without difficulty  Stairs Stairs: Yes Stairs assistance: Contact guard assist, Supervision Stair Management: One rail Right Number of Stairs: 7 General stair comments: reciprocal stepping; does require UE support for optimal safety  Wheelchair Mobility     Tilt Bed    Modified Rankin (Stroke Patients Only)       Balance Overall balance assessment: Needs assistance Sitting-balance support: No upper extremity supported, Feet supported Sitting balance-Leahy Scale: Good     Standing balance support: Bilateral upper extremity supported Standing balance-Leahy Scale: Fair  Pertinent Vitals/Pain Pain Assessment Pain Assessment: No/denies pain    Home Living Family/patient expects to be discharged to:: Private residence Living Arrangements: Alone Available Help at Discharge: Family;Available PRN/intermittently Type of  Home: House Home Access: Stairs to enter   Entrance Stairs-Number of Steps: 3     Home Equipment: Agricultural consultant (2 wheels);Cane - single point Additional Comments: daughter nearby    Prior Function Prior Level of Function : Independent/Modified Independent;Driving             Mobility Comments: reports does not need to use AD       Extremity/Trunk Assessment   Upper Extremity Assessment Upper Extremity Assessment: Overall WFL for tasks assessed    Lower Extremity Assessment Lower Extremity Assessment: Overall WFL for tasks assessed (grossly 4+/5 throughout; no focal weakness appreciated)       Communication      Cognition Arousal: Alert Behavior During Therapy: WFL for tasks assessed/performed Overall Cognitive Status: Within Functional Limits for tasks assessed                                          General Comments      Exercises     Assessment/Plan    PT Assessment Patient needs continued PT services  PT Problem List Decreased activity tolerance;Decreased balance;Decreased mobility;Decreased coordination;Decreased knowledge of use of DME;Decreased safety awareness;Decreased knowledge of precautions;Cardiopulmonary status limiting activity       PT Treatment Interventions DME instruction;Gait training;Stair training;Functional mobility training;Therapeutic activities;Therapeutic exercise;Balance training;Patient/family education    PT Goals (Current goals can be found in the Care Plan section)  Acute Rehab PT Goals Patient Stated Goal: to go home PT Goal Formulation: With patient Time For Goal Achievement: 08/12/23 Potential to Achieve Goals: Good    Frequency Min 1X/week     Co-evaluation               AM-PAC PT "6 Clicks" Mobility  Outcome Measure Help needed turning from your back to your side while in a flat bed without using bedrails?: None Help needed moving from lying on your back to sitting on the side of a flat  bed without using bedrails?: None Help needed moving to and from a bed to a chair (including a wheelchair)?: None Help needed standing up from a chair using your arms (e.g., wheelchair or bedside chair)?: A Little Help needed to walk in hospital room?: A Little Help needed climbing 3-5 steps with a railing? : A Little 6 Click Score: 21    End of Session Equipment Utilized During Treatment: Gait belt Activity Tolerance: Patient tolerated treatment well Patient left: in bed;with call bell/phone within reach Nurse Communication: Mobility status PT Visit Diagnosis: Muscle weakness (generalized) (M62.81);Difficulty in walking, not elsewhere classified (R26.2)    Time: 1135-1150 PT Time Calculation (min) (ACUTE ONLY): 15 min   Charges:   PT Evaluation $PT Eval Low Complexity: 1 Low   PT General Charges $$ ACUTE PT VISIT: 1 Visit         Sheniece Ruggles H. Manson Passey, PT, DPT, NCS 07/29/23, 2:02 PM [phone]

## 2023-07-29 NOTE — Assessment & Plan Note (Signed)
On Lexapro

## 2023-07-29 NOTE — Assessment & Plan Note (Signed)
Follows at the [place].  Get an MRI of the brain to rule out metastases.

## 2023-07-29 NOTE — Progress Notes (Signed)
Progress Note   Patient: JATHEN ALBANESE WGN:[id number] DOB: May 25, 1946 DOA: 07/28/2023     1 DOS: the patient was seen and examined on 07/29/2023   Brief hospital course: Mr. [name] is a 77 year old male with history of dementia, CAD, hypertension, opioid pain dependence, depression, hyperlipidemia, COPD, history of lung mass, history of diverticulosis, tubular adenoma of the colon, history of lower GI bleed, who presents to the emergency department for chief concerns of weakness.  10/15.  As per patient's daughter patient has a history of lung cancer.  She is concerned with his new headache that it may have spread to the brain.  Patient complained of headache and he normally does not get a headache.  Patient complains of shortness of breath cough and wheeze and weakness.  Assessment and Plan: * Left lower lobe pneumonia Patient on Rocephin and Zithromax.  Procalcitonin negative.  Will send off viral respiratory panel.  With history of lung cancer in that left lower lobe this is what we could be seeing on the x-ray.  COPD exacerbation (HCC) Continue Solu-Medrol 40 mg daily and nebulizer treatments.  Headache New onset in patient older than 50 and history of lung cancer.  CT scan of the head without contrast negative.  Will give a dose of Toradol and Solu-Medrol to see if that helps.  Get patient back on his chronic pain medication schedule.  Will get an MRI of the brain with contrast to rule out metastases.  Lung cancer (HCC) Follows at the [place].  Get an MRI of the brain to rule out metastases.  Dementia without behavioral disturbance (HCC) Mild dementia Resumed home donepezil 5 mg every morning  Hyponatremia Sodium 1 point less than the normal range  Weakness PT evaluation.  Will MRI brain.  Narcotic dependency, continuous (HCC) Continue MS Contin and Percocet.  Asked pharmacist to dose closer to him when he is taking at home.  Anemia Hemoglobin 10.6.  Check ferritin  tomorrow.  Major depressive disorder, recurrent, in full remission (HCC) On Lexapro  Pure hypercholesterolemia Continue Crestor  Tobacco user As needed nicotine patch ordered        Subjective: Patient complains of a headache and states he has been off his pain medication schedule that he takes at home.  Complains of coughing and shortness of breath and weakness.  Physical Exam: Vitals:   07/28/23 2020 07/29/23 0041 07/29/23 0439 07/29/23 0749  BP: (!) 140/60 (!) 123/57 (!) 141/74 (!) 130/53  Pulse: 76 69 61 62  Resp: 18 18 18 18   Temp: 98.1 F (36.7 C) 97.9 F (36.6 C) 98.2 F (36.8 C) 98.5 F (36.9 C)  TempSrc:    Oral  SpO2: 95% 96% 97% 97%  Weight: 55.5 kg     Height: 5\' 8"  (1.727 m)      Physical Exam HENT:     Head: Normocephalic.     Mouth/Throat:     Pharynx: No oropharyngeal exudate.  Eyes:     General: Lids are normal.     Conjunctiva/sclera: Conjunctivae normal.  Cardiovascular:     Rate and Rhythm: Normal rate and regular rhythm.     Heart sounds: Normal heart sounds, S1 normal and S2 normal.  Pulmonary:     Breath sounds: Examination of the right-middle field reveals decreased breath sounds and wheezing. Examination of the left-middle field reveals decreased breath sounds and wheezing. Examination of the right-lower field reveals decreased breath sounds and wheezing. Examination of the left-lower field reveals decreased breath sounds  and wheezing. Decreased breath sounds and wheezing present. No rhonchi or rales.  Abdominal:     Palpations: Abdomen is soft.     Tenderness: There is no abdominal tenderness.  Musculoskeletal:     Right lower leg: No swelling.     Left lower leg: No swelling.  Skin:    General: Skin is warm.     Findings: No rash.  Neurological:     Mental Status: He is alert and oriented to person, place, and time.     Data Reviewed: Sodium 134, potassium 3.5, creatinine 0.72, procalcitonin negative, white blood cell count 4.4,  hemoglobin 10.6 and platelet count 202  Family Communication:   Disposition: Status is: Inpatient Remains inpatient appropriate because: Patient with diffuse bronchospasm.  Continue IV Solu-Medrol.  Nebulizer treatments  Planned Discharge Destination: Home with Home Health    Time spent: 28 minutes  Author: Alford Highland, MD 07/29/2023 11:41 AM  For on call review [email or web address].

## 2023-07-30 DIAGNOSIS — F3342 Major depressive disorder, recurrent, in full remission: Secondary | ICD-10-CM | POA: Diagnosis not present

## 2023-07-30 DIAGNOSIS — F0390 Unspecified dementia without behavioral disturbance: Secondary | ICD-10-CM | POA: Diagnosis not present

## 2023-07-30 DIAGNOSIS — J189 Pneumonia, unspecified organism: Secondary | ICD-10-CM | POA: Diagnosis not present

## 2023-07-30 DIAGNOSIS — J441 Chronic obstructive pulmonary disease with (acute) exacerbation: Secondary | ICD-10-CM | POA: Diagnosis not present

## 2023-07-30 LAB — BASIC METABOLIC PANEL
Anion gap: 7 (ref 5–15)
BUN: 23 mg/dL (ref 8–23)
CO2: 27 mmol/L (ref 22–32)
Calcium: 9 mg/dL (ref 8.9–10.3)
Chloride: 102 mmol/L (ref 98–111)
Creatinine, Ser: 0.72 mg/dL (ref 0.61–1.24)
GFR, Estimated: >60 mL/min (ref >60)
Glucose, Bld: 118 mg/dL — ABNORMAL HIGH (ref 70–99)
Potassium: 3.7 mmol/L (ref 3.5–5.1)
Sodium: 136 mmol/L (ref 135–145)

## 2023-07-30 LAB — FERRITIN: Ferritin: 86 ng/mL (ref 24–336)

## 2023-07-30 LAB — HEMOGLOBIN: Hemoglobin: 10.3 g/dL — ABNORMAL LOW (ref 13.0–17.0)

## 2023-07-30 MED ORDER — CEFDINIR 300 MG PO CAPS: 300.0000 mg | ORAL_CAPSULE | Freq: Two times a day (BID) | ORAL | 0 refills | Status: AC

## 2023-07-30 MED ORDER — NICOTINE 21 MG/24HR TD PT24: 21.0000 mg | MEDICATED_PATCH | Freq: Every day | TRANSDERMAL | 0 refills | Status: DC | PRN

## 2023-07-30 MED ORDER — AZITHROMYCIN 500 MG PO TABS
250.0000 mg | ORAL_TABLET | Freq: Every day | ORAL | Status: DC
  Administered 2023-07-30: 250 mg via ORAL
  Filled 2023-07-30: qty 1

## 2023-07-30 MED ORDER — ENSURE ENLIVE PO LIQD: 237.0000 mL | Freq: Three times a day (TID) | ORAL | 12 refills | Status: DC

## 2023-07-30 MED ORDER — CEFDINIR 300 MG PO CAPS
300.0000 mg | ORAL_CAPSULE | Freq: Two times a day (BID) | ORAL | Status: DC
  Administered 2023-07-30: 300 mg via ORAL
  Filled 2023-07-30: qty 1

## 2023-07-30 MED ORDER — IPRATROPIUM-ALBUTEROL 0.5-2.5 (3) MG/3ML IN SOLN: 3.0000 mL | Freq: Two times a day (BID) | RESPIRATORY_TRACT | 1 refills | Status: DC

## 2023-07-30 MED ORDER — ADULT MULTIVITAMIN W/MINERALS CH: 1.0000 | ORAL_TABLET | Freq: Every day | ORAL | 0 refills | Status: DC

## 2023-07-30 MED ORDER — PREDNISONE 20 MG PO TABS: 20.0000 mg | ORAL_TABLET | Freq: Every day | ORAL | 0 refills | Status: AC

## 2023-07-30 MED ORDER — PREDNISONE 20 MG PO TABS: 20.0000 mg | ORAL_TABLET | Freq: Every day | ORAL | Status: DC

## 2023-07-30 MED ORDER — AZITHROMYCIN 250 MG PO TABS: 250.0000 mg | ORAL_TABLET | Freq: Every day | ORAL | 0 refills | Status: AC

## 2023-07-30 NOTE — Progress Notes (Signed)
Physical Therapy Treatment Patient Details Name: [name] MRN: [id number] DOB: Apr 29, 1946 Today's Date: 07/30/2023   History of Present Illness [name] is a 77 year old male with history of dementia, CAD, hypertension, opioid pain dependence, depression, hyperlipidemia, COPD, history of lung mass, history of diverticulosis, tubular adenoma of the colon, history of lower GI bleed, who presents to the emergency department for chief concerns of weakness; admitted for management of LLE PNA    PT Comments  Patient in bed upon arrival, agreeable to PT session this date. Denies pain at start of session, but did experience increase in LBP with functional activities. Patient able to tolerate ambulation of approx 250' without use of AD and supervision. Mild unsteadiness with high level balance activities including head turns. Patient continue to desat to 88% on RA with ambulation, but recovers to low to mids 90's with seated rest break and pursed lip breathing. Patient will continue to benefit from skilled acute PT services to address deficits and return to PLOF. Will continue to follow acutely.     If plan is discharge home, recommend the following: A little help with walking and/or transfers;A little help with bathing/dressing/bathroom   Can travel by private vehicle        Equipment Recommendations       Recommendations for Other Services       Precautions / Restrictions Precautions Precautions: Fall Restrictions Weight Bearing Restrictions: No     Mobility  Bed Mobility Overal bed mobility: Modified Independent             General bed mobility comments: increased time required; supine <> sit at start and end of session    Transfers Overall transfer level: Modified independent Equipment used: None Transfers: Sit to/from Stand Sit to Stand: Supervision           General transfer comment: supervision for safety, no physical assist.     Ambulation/Gait Ambulation/Gait assistance: Supervision Gait Distance (Feet): 250 Feet Assistive device: None         General Gait Details: reciprocal stepping pattern with fair step height/length; forward flexed, kyphotic posture; No LOB or buckling, but some mild veering with addition of horiz/vertical head movements to challenge balance. Pt with mild SOB, Sp02: 88%, improved to 93% with pursed lip breathing and seated rest break   Stairs             Wheelchair Mobility     Tilt Bed    Modified Rankin (Stroke Patients Only)       Balance Overall balance assessment: Needs assistance Sitting-balance support: No upper extremity supported, Feet supported Sitting balance-Leahy Scale: Good     Standing balance support: No upper extremity supported, During functional activity Standing balance-Leahy Scale: Fair               High level balance activites: Head turns High Level Balance Comments: mild unsteadiness with ambulation with addition of head movement/turns during ambulation; supervision/CGA            Cognition Arousal: Alert Behavior During Therapy: WFL for tasks assessed/performed Overall Cognitive Status: Within Functional Limits for tasks assessed                                          Exercises Other Exercises Other Exercises: sit to stands from EOB with intermittent UE use x 10 reps for functional strengthening. Other Exercises: patient stood at  sink with supervision, completed ADL task including brushing teeth and combing hair Mod I    General Comments        Pertinent Vitals/Pain Pain Assessment Pain Assessment: No/denies pain    Home Living                          Prior Function            PT Goals (current goals can now be found in the care plan section) Acute Rehab PT Goals Patient Stated Goal: to go home PT Goal Formulation: With patient Time For Goal Achievement: 08/12/23 Potential to  Achieve Goals: Good    Frequency    Min 1X/week      PT Plan      Co-evaluation              AM-PAC PT "6 Clicks" Mobility   Outcome Measure  Help needed turning from your back to your side while in a flat bed without using bedrails?: None Help needed moving from lying on your back to sitting on the side of a flat bed without using bedrails?: None Help needed moving to and from a bed to a chair (including a wheelchair)?: None Help needed standing up from a chair using your arms (e.g., wheelchair or bedside chair)?: A Little Help needed to walk in hospital room?: A Little Help needed climbing 3-5 steps with a railing? : A Little 6 Click Score: 21    End of Session Equipment Utilized During Treatment: Gait belt Activity Tolerance: Patient tolerated treatment well Patient left: in bed;with call bell/phone within reach Nurse Communication: Mobility status PT Visit Diagnosis: Muscle weakness (generalized) (M62.81);Difficulty in walking, not elsewhere classified (R26.2)     Time: 9147-8295 PT Time Calculation (min) (ACUTE ONLY): 17 min  Charges:    $Gait Training: 8-22 mins PT General Charges $$ ACUTE PT VISIT: 1 Visit                     Creed Copper Fairly, PT, DPT 07/30/23 9:35 AM

## 2023-07-30 NOTE — TOC Transition Note (Signed)
Transition of Care [facility]) - CM/SW Discharge Note   Patient Details  Name: [name] MRN: [id number] Date of Birth: 07-10-1946  [facility] CM/SW Contact:  Allena Katz, LCSW Phone Number: 07/30/2023, 10:43 AM   Clinical Narrative:   Pt discharging home with [facility]. Morrie Sheldon with [facility] notified. Nebulizer delivered to patients room via [employer]. No other needs at this time.    Final next level of care: Home w Home Health Services Barriers to Discharge: Barriers Resolved   Patient Goals and CMS Choice CMS Medicare.gov Compare Post Acute Care list provided to:: Patient Choice offered to / list presented to : Patient  Discharge Placement                         Discharge Plan and Services Additional resources added to the After Visit Summary for                  DME Arranged: Nebulizer machine DME Agency: [facility]       HH Arranged: PT, OT HH Agency: [facility] Date HH Agency Contacted: 07/29/23 Time HH Agency Contacted: 1000 Representative spoke with at [facility] Agency: Morrie Sheldon  Social Determinants of Health (SDOH) Interventions SDOH Screenings   Food Insecurity: No Food Insecurity (07/28/2023)  Housing: Low Risk  (07/28/2023)  Transportation Needs: No Transportation Needs (07/28/2023)  Utilities: Not At Risk (07/28/2023)  Financial Resource Strain: Low Risk  (01/17/2023)   Received from [facility], [facility]  Tobacco Use: High Risk (07/28/2023)     Readmission Risk Interventions    07/29/2023    3:36 PM  Readmission Risk Prevention Plan  Transportation Screening Complete  Medication Review (RN Care Manager) Complete  PCP or Specialist appointment within 3-5 days of discharge Complete  SW Recovery Care/Counseling Consult Complete  Skilled Nursing Facility Complete

## 2023-07-30 NOTE — Discharge Instructions (Signed)
Patient advised to uses nebulizer as before. Smoking cessation advised he [facility]

## 2023-07-30 NOTE — Discharge Summary (Signed)
Physician Discharge Summary   Patient: [name] MRN: [id number] DOB: 19-Sep-1946  Admit date:     07/28/2023  Discharge date: 07/30/23  Discharge Physician: Enedina Finner   PCP: Barbette Reichmann, MD   Recommendations at discharge:    Patient advised to uses nebulizer as before. Smoking cessation advised he will [facility] follow-up Dr. Marcello Fennel in 1 to 2 weeks  Discharge Diagnoses: Principal Problem:   Left lower lobe pneumonia Active Problems:   COPD exacerbation (HCC)   Lung cancer (HCC)   Headache   Dementia without behavioral disturbance (HCC)   Tobacco user   Pure hypercholesterolemia   Major depressive disorder, recurrent, in full remission (HCC)   Anemia   Narcotic dependency, continuous (HCC)   Weakness   Hyponatremia   Protein-calorie malnutrition, severe  [name] is a 77 year old male with history of dementia, CAD, hypertension, opioid pain dependence, depression, hyperlipidemia, COPD, history of lung mass, history of diverticulosis, tubular adenoma of the colon, history of lower GI bleed, who presents to the emergency department for chief concerns of weakness.   10/15.  As per patient's daughter patient has a history of lung cancer.  She is concerned with his new headache that it may have spread to the brain.  Patient complained of headache and he normally does not get a headache.  Patient complains of shortness of breath cough and wheeze and weakness.  10/16--assumed care-- patient overall feels better. He is eager to go home. Ambulated with physical therapy about 250 feet. Oxygen transiently drop to 88% however most of the time has remained more than 90%. Discussed about smoking cessation and if patient is agreeable for home oxygen. Patient declined home oxygen..   Assessment and Plan:  Left lower lobe pneumonia Patient on Rocephin and Zithromax.  Procalcitonin negative.  Will send off viral respiratory panel.  With  history of lung cancer in that left lower lobe this is what we could be seeing on the x-ray. -- Change to PO antibiotics. -- Patient will follow-up with PCP   COPD exacerbation (HCC) Continue Solu-Medrol 40 mg daily and nebulizer treatments.-- Change to PO steroids for five days.   Headache New onset in patient older than 50 and history of lung cancer.  CT scan of the head without contrast negative.  Will give a dose of Toradol and Solu-Medrol to see if that helps.  Get patient back on his chronic pain medication schedule.   --MRI of the brain negative for metastases.   Lung cancer (HCC) Follows at the [place].     Dementia without behavioral disturbance (HCC) Mild dementia Resumed home donepezil 5 mg every morning   Hyponatremia Sodium 1 point less than the normal range   Weakness PT evaluation-- noted. Home health PT arranged.   Narcotic dependency, continuous (HCC) Continue MS Contin and Percocet.  Asked pharmacist to dose closer to him when he is taking at home.   Anemia Hemoglobin 10.6.     Major depressive disorder, recurrent, in full remission (HCC) On Lexapro   Pure hypercholesterolemia Continue Crestor   Tobacco user As needed nicotine patch ordered   hemodynamically stable. Discharge plan discussed with patient and daughter [name] on the phone.        Pain control - [employer] Controlled Substance Reporting System database was reviewed. and patient was instructed, not to drive, operate heavy machinery, perform activities at heights, swimming or participation in water activities or provide baby-sitting services while on  Pain, Sleep and Anxiety Medications; until their outpatient Physician has advised to do so again. Also recommended to not to take more than prescribed Pain, Sleep and Anxiety Medications.   Disposition: Home health Diet recommendation:  Cardiac diet DISCHARGE MEDICATION: Allergies as of 07/30/2023       Reactions   Levofloxacin  Itching, Rash   Doxycycline Nausea And Vomiting        Medication List     TAKE these medications    aspirin EC 81 MG tablet Take 1 tablet (81 mg total) by mouth daily.   azithromycin 250 MG tablet Commonly known as: ZITHROMAX Take 1 tablet (250 mg total) by mouth daily for 4 days.   cefdinir 300 MG capsule Commonly known as: OMNICEF Take 1 capsule (300 mg total) by mouth every 12 (twelve) hours for 6 days.   cyanocobalamin 500 MCG tablet Commonly known as: VITAMIN B12 Take 500 mcg by mouth 2 (two) times daily. VITAMIN B-12   donepezil 10 MG tablet Commonly known as: ARICEPT TAKE ONE-HALF TABLET BY MOUTH EVERY DAY FOR DEMENTIA   escitalopram 20 MG tablet Commonly known as: LEXAPRO Take 1 tablet (20 mg total) by mouth daily.   feeding supplement Liqd Take 237 mLs by mouth 3 (three) times daily between meals.   folic acid 1 MG tablet Commonly known as: FOLVITE Take 1 mg by mouth daily.   gabapentin 400 MG capsule Commonly known as: NEURONTIN Take 400 mg by mouth at bedtime.   ipratropium-albuterol 0.5-2.5 (3) MG/3ML Soln Commonly known as: DUONEB Take 3 mLs by nebulization 2 (two) times daily.   montelukast 10 MG tablet Commonly known as: SINGULAIR TAKE 1 TABLET AT BEDTIME   morphine 15 MG 12 hr tablet Commonly known as: MS CONTIN Take 15 mg by mouth 2 (two) times daily.   multivitamin with minerals Tabs tablet Take 1 tablet by mouth daily. Start taking on: July 31, 2023   nicotine 21 mg/24hr patch Commonly known as: NICODERM CQ - dosed in mg/24 hours Place 1 patch (21 mg total) onto the skin daily as needed (nicotine craving).   oxyCODONE-acetaminophen 5-325 MG tablet Commonly known as: PERCOCET/ROXICET Take 1 tablet by mouth every 4 (four) hours as needed for moderate pain. For pain   potassium & sodium phosphates 280-160-250 MG Pack Commonly known as: PHOS-NAK Take 1 packet by mouth 4 (four) times daily -  with meals and at bedtime.    predniSONE 20 MG tablet Commonly known as: DELTASONE Take 1 tablet (20 mg total) by mouth daily with breakfast for 5 days. Start taking on: July 31, 2023   rosuvastatin 40 MG tablet Commonly known as: CRESTOR Take 1 tablet (40 mg total) by mouth daily.   Vitamin D (Ergocalciferol) 1.25 MG (50000 UNIT) Caps capsule Commonly known as: DRISDOL Take 50,000 Units by mouth every Wednesday.               Durable Medical Equipment  (From admission, onward)           Start     Ordered   07/29/23 1548  For home use only DME Nebulizer machine  Once       Question Answer Comment  Patient needs a nebulizer to treat with the following condition COPD exacerbation (HCC)   Length of Need Lifetime      07/29/23 1548            Follow-up Information     Barbette Reichmann, MD. Go on 08/04/2023.   Specialty:  Internal Medicine Why: 8:45am Contact information: [facility] [address] [place] [address] [phone]                Discharge Exam: Ceasar Mons Weights   07/28/23 2020  Weight: 55.5 kg   Alert and o x3 Resp distant BS Cvs normal     Condition at discharge: fair  The results of significant diagnostics from this hospitalization (including imaging, microbiology, ancillary and laboratory) are listed below for reference.   Imaging Studies: MR BRAIN W WO CONTRAST  Result Date: 07/29/2023 CLINICAL DATA:  Headache, new onset. History of lung cancer. Weakness. Dementia. EXAM: MRI HEAD WITHOUT AND WITH CONTRAST TECHNIQUE: Multiplanar, multiecho pulse sequences of the brain and surrounding structures were obtained without and with intravenous contrast. CONTRAST:  5mL GADAVIST GADOBUTROL 1 MMOL/ML IV SOLN COMPARISON:  Head CT yesterday.  MRI 03/22/2015. FINDINGS: Brain: Diffusion imaging does not show any acute or subacute infarction or other cause of restricted diffusion. Minimal small vessel change affects the pons. No focal  cerebellar finding. Cerebral hemispheres show age related volume loss without subjective lobar predominance. There are old small vessel infarctions within the hemispheric white matter, mild in degree. No mass lesion, hemorrhage, hydrocephalus or extra-axial collection. After contrast administration, no abnormal enhancement occurs. Vascular: Major vessels at the base of the brain show flow. Skull and upper cervical spine: Negative Sinuses/Orbits: Clear/normal Other: None IMPRESSION: 1. No acute finding by MRI. No evidence of metastatic disease. 2. Age related volume loss. Mild chronic small-vessel ischemic changes of the pons and cerebral hemispheric white matter. Electronically Signed   By: Paulina Fusi M.D.   On: 07/29/2023 17:24   CT HEAD WO CONTRAST ( )  Result Date: 07/29/2023 CLINICAL DATA:  Headache EXAM: CT HEAD WITHOUT CONTRAST TECHNIQUE: Contiguous axial images were obtained from the base of the skull through the vertex without intravenous contrast. RADIATION DOSE REDUCTION: This exam was performed according to the departmental dose-optimization program which includes automated exposure control, adjustment of the mA and/or kV according to patient size and/or use of iterative reconstruction technique. COMPARISON:  06/17/2023 FINDINGS: Brain: No evidence of acute infarction, hemorrhage, mass, mass effect, or midline shift. No hydrocephalus or extra-axial fluid collection. Vascular: No hyperdense vessel. Skull: Negative for fracture or focal lesion. Sinuses/Orbits: No acute finding. Status post bilateral lens replacements. Other: The mastoid air cells are well aerated. IMPRESSION: No acute intracranial process. Electronically Signed   By: Wiliam Ke M.D.   On: 07/29/2023 01:07   DG Chest 2 View  Result Date: 07/28/2023 CLINICAL DATA:  Weakness and fatigue. EXAM: CHEST - 2 VIEW COMPARISON:  Chest radiograph dated 06/17/2023. FINDINGS: Faint diffuse interstitial coarsening and nodularity, improved  since the prior radiograph. Left lung base density concerning for pneumonia. Clinical correlation and follow-up to resolution recommended. Trace left pleural effusion suspected. No pneumothorax. Stable mild cardiomegaly. Atherosclerotic calcification of the aorta. No acute osseous pathology. IMPRESSION: Left lung base density concerning for pneumonia. Clinical correlation and follow-up to resolution recommended. Electronically Signed   By: Elgie Collard M.D.   On: 07/28/2023 18:28    Microbiology: Results for orders placed or performed during the hospital encounter of 07/28/23  SARS Coronavirus 2 by RT PCR (hospital order, performed in [facility] hospital lab) *cepheid single result test* Anterior Nasal Swab     Status: None   Collection Time: 07/28/23  3:40 PM   Specimen: Anterior Nasal Swab  Result Value Ref Range Status   SARS Coronavirus 2 by  RT PCR NEGATIVE NEGATIVE Final    Comment: (NOTE) SARS-CoV-2 target nucleic acids are NOT DETECTED.  The SARS-CoV-2 RNA is generally detectable in upper and lower respiratory specimens during the acute phase of infection. The lowest concentration of SARS-CoV-2 viral copies this assay can detect is 250 copies / mL. A negative result does not preclude SARS-CoV-2 infection and should not be used as the sole basis for treatment or other patient management decisions.  A negative result may occur with improper specimen collection / handling, submission of specimen other than nasopharyngeal swab, presence of viral mutation(s) within the areas targeted by this assay, and inadequate number of viral copies (<250 copies / mL). A negative result must be combined with clinical observations, patient history, and epidemiological information.  Fact Sheet for Patients:   RoadLapTop.co.za  Fact Sheet for Healthcare Providers: http://kim-miller.com/  This test is not yet approved or  cleared by the [place] FDA  and has been authorized for detection and/or diagnosis of SARS-CoV-2 by FDA under an Emergency Use Authorization (EUA).  This EUA will remain in effect (meaning this test can be used) for the duration of the COVID-19 declaration under Section 564(b)(1) of the Act, 21 U.S.C. section 360bbb-3(b)(1), unless the authorization is terminated or revoked sooner.  Performed at [facility], [address] [place], [place] [address]   Blood culture (routine x 2)     Status: None (Preliminary result)   Collection Time: 07/28/23  6:39 PM   Specimen: BLOOD  Result Value Ref Range Status   Specimen Description BLOOD BLOOD LEFT ARM  Final   Special Requests   Final    BOTTLES DRAWN AEROBIC AND ANAEROBIC Blood Culture results may not be optimal due to an inadequate volume of blood received in culture bottles   Culture   Final    NO GROWTH 2 DAYS Performed at [facility], [address] [place], [place] [address]    Report Status PENDING  Incomplete  Blood culture (routine x 2)     Status: None (Preliminary result)   Collection Time: 07/28/23  6:44 PM   Specimen: BLOOD  Result Value Ref Range Status   Specimen Description BLOOD BLOOD RIGHT ARM  Final   Special Requests   Final    BOTTLES DRAWN AEROBIC AND ANAEROBIC Blood Culture adequate volume   Culture   Final    NO GROWTH 2 DAYS Performed at [facility], [address], [place], [place] [address]    Report Status PENDING  Incomplete    Labs: CBC: Recent Labs  Lab 07/28/23 1203 07/29/23 0512 07/30/23 0425  WBC 6.3 4.4  --   HGB 12.1* 10.6* 10.3*  HCT 36.3* 31.6*  --   MCV 96.5 96.0  --   PLT 237 202  --    Basic Metabolic Panel: Recent Labs  Lab 07/28/23 1203 07/29/23 0512 07/30/23 0425  NA 136 134* 136  K 4.1 3.5 3.7  CL 99 100 102  CO2 24 25 27   GLUCOSE 91 149* 118*  BUN 18 20 23   CREATININE 0.74 0.72 0.72  CALCIUM 9.2 8.9 9.0   CBG: Recent Labs  Lab 07/28/23 1221  GLUCAP 81     Discharge time spent: greater than 30 minutes.  Signed: Enedina Finner, MD [facility] 07/30/2023

## 2023-07-30 NOTE — Plan of Care (Signed)
  Problem: Education: Goal: Knowledge of risk factors and measures for prevention of condition will improve Outcome: Completed/Met   Problem: Coping: Goal: Psychosocial and spiritual needs will be supported Outcome: Completed/Met   Problem: Respiratory: Goal: Will maintain a patent airway Outcome: Completed/Met Goal: Complications related to the disease process, condition or treatment will be avoided or minimized Outcome: Completed/Met   Problem: Education: Goal: Knowledge of General Education information will improve Description: Including pain rating scale, medication(s)/side effects and non-pharmacologic comfort measures Outcome: Completed/Met   Problem: Clinical Measurements: Goal: Will remain free from infection Outcome: Completed/Met   Problem: Clinical Measurements: Goal: Ability to maintain clinical measurements within normal limits will improve Outcome: Completed/Met Goal: Will remain free from infection Outcome: Completed/Met Goal: Diagnostic test results will improve Outcome: Completed/Met Goal: Respiratory complications will improve Outcome: Completed/Met Goal: Cardiovascular complication will be avoided Outcome: Completed/Met   Problem: Activity: Goal: Risk for activity intolerance will decrease Outcome: Completed/Met

## 2023-08-02 LAB — CULTURE, BLOOD (ROUTINE X 2)
Culture: NO GROWTH
Culture: NO GROWTH
Special Requests: ADEQUATE

## 2023-08-06 ENCOUNTER — Telehealth: Payer: Self-pay

## 2023-08-20 ENCOUNTER — Emergency Department (HOSPITAL_COMMUNITY): Payer: No Typology Code available for payment source

## 2023-08-20 ENCOUNTER — Inpatient Hospital Stay (HOSPITAL_COMMUNITY)
Admission: EM | Admit: 2023-08-20 | Discharge: 2023-08-22 | DRG: 190 | Disposition: A | Discharge status: Home / Self Care | Payer: No Typology Code available for payment source | Attending: Family Medicine | Admitting: Family Medicine

## 2023-08-20 ENCOUNTER — Other Ambulatory Visit: Payer: Self-pay

## 2023-08-20 DIAGNOSIS — Z881 Allergy status to other antibiotic agents status: Secondary | ICD-10-CM

## 2023-08-20 DIAGNOSIS — D649 Anemia, unspecified: Secondary | ICD-10-CM | POA: Diagnosis present

## 2023-08-20 DIAGNOSIS — F0390 Unspecified dementia without behavioral disturbance: Secondary | ICD-10-CM | POA: Diagnosis not present

## 2023-08-20 DIAGNOSIS — Z8249 Family history of ischemic heart disease and other diseases of the circulatory system: Secondary | ICD-10-CM

## 2023-08-20 DIAGNOSIS — Z951 Presence of aortocoronary bypass graft: Secondary | ICD-10-CM

## 2023-08-20 DIAGNOSIS — E785 Hyperlipidemia, unspecified: Secondary | ICD-10-CM | POA: Diagnosis present

## 2023-08-20 DIAGNOSIS — Z79899 Other long term (current) drug therapy: Secondary | ICD-10-CM

## 2023-08-20 DIAGNOSIS — M419 Scoliosis, unspecified: Secondary | ICD-10-CM | POA: Diagnosis present

## 2023-08-20 DIAGNOSIS — Z1152 Encounter for screening for COVID-19: Secondary | ICD-10-CM

## 2023-08-20 DIAGNOSIS — Z923 Personal history of irradiation: Secondary | ICD-10-CM

## 2023-08-20 DIAGNOSIS — Z860100 Personal history of colon polyps, unspecified: Secondary | ICD-10-CM

## 2023-08-20 DIAGNOSIS — I2510 Atherosclerotic heart disease of native coronary artery without angina pectoris: Secondary | ICD-10-CM | POA: Diagnosis present

## 2023-08-20 DIAGNOSIS — Z7982 Long term (current) use of aspirin: Secondary | ICD-10-CM

## 2023-08-20 DIAGNOSIS — Z681 Body mass index (BMI) 19 or less, adult: Secondary | ICD-10-CM

## 2023-08-20 DIAGNOSIS — J440 Chronic obstructive pulmonary disease with acute lower respiratory infection: Secondary | ICD-10-CM | POA: Diagnosis present

## 2023-08-20 DIAGNOSIS — F1120 Opioid dependence, uncomplicated: Secondary | ICD-10-CM | POA: Diagnosis not present

## 2023-08-20 DIAGNOSIS — F17210 Nicotine dependence, cigarettes, uncomplicated: Secondary | ICD-10-CM | POA: Diagnosis present

## 2023-08-20 DIAGNOSIS — Z820 Family history of epilepsy and other diseases of the nervous system: Secondary | ICD-10-CM

## 2023-08-20 DIAGNOSIS — J209 Acute bronchitis, unspecified: Secondary | ICD-10-CM | POA: Diagnosis present

## 2023-08-20 DIAGNOSIS — R0902 Hypoxemia: Secondary | ICD-10-CM | POA: Diagnosis present

## 2023-08-20 DIAGNOSIS — J441 Chronic obstructive pulmonary disease with (acute) exacerbation: Secondary | ICD-10-CM | POA: Diagnosis not present

## 2023-08-20 DIAGNOSIS — E43 Unspecified severe protein-calorie malnutrition: Secondary | ICD-10-CM | POA: Diagnosis present

## 2023-08-20 DIAGNOSIS — I10 Essential (primary) hypertension: Secondary | ICD-10-CM | POA: Diagnosis present

## 2023-08-20 DIAGNOSIS — C3490 Malignant neoplasm of unspecified part of unspecified bronchus or lung: Secondary | ICD-10-CM | POA: Diagnosis not present

## 2023-08-20 DIAGNOSIS — Z7952 Long term (current) use of systemic steroids: Secondary | ICD-10-CM

## 2023-08-20 DIAGNOSIS — Z833 Family history of diabetes mellitus: Secondary | ICD-10-CM

## 2023-08-20 DIAGNOSIS — Z79891 Long term (current) use of opiate analgesic: Secondary | ICD-10-CM

## 2023-08-20 DIAGNOSIS — Z955 Presence of coronary angioplasty implant and graft: Secondary | ICD-10-CM

## 2023-08-20 DIAGNOSIS — D638 Anemia in other chronic diseases classified elsewhere: Secondary | ICD-10-CM | POA: Diagnosis present

## 2023-08-20 LAB — CBC WITH DIFFERENTIAL/PLATELET
Abs Immature Granulocytes: 0.02 10*3/uL (ref 0.00–0.07)
Basophils Absolute: 0 10*3/uL (ref 0.0–0.1)
Basophils Relative: 0 %
Eosinophils Absolute: 0 10*3/uL (ref 0.0–0.5)
Eosinophils Relative: 0 %
HCT: 33.7 % — ABNORMAL LOW (ref 39.0–52.0)
Hemoglobin: 10.7 g/dL — ABNORMAL LOW (ref 13.0–17.0)
Immature Granulocytes: 1 %
Lymphocytes Relative: 10 %
Lymphs Abs: 0.4 10*3/uL — ABNORMAL LOW (ref 0.7–4.0)
MCH: 31.2 pg (ref 26.0–34.0)
MCHC: 31.8 g/dL (ref 30.0–36.0)
MCV: 98.3 fL (ref 80.0–100.0)
Monocytes Absolute: 0.2 10*3/uL (ref 0.1–1.0)
Monocytes Relative: 5 %
Neutro Abs: 3.6 10*3/uL (ref 1.7–7.7)
Neutrophils Relative %: 84 %
Platelets: 169 10*3/uL (ref 150–400)
RBC: 3.43 MIL/uL — ABNORMAL LOW (ref 4.22–5.81)
RDW: 15.2 % (ref 11.5–15.5)
WBC: 4.3 10*3/uL (ref 4.0–10.5)
nRBC: 0 % (ref 0.0–0.2)

## 2023-08-20 LAB — I-STAT VENOUS BLOOD GAS, ED
Acid-Base Excess: 3 mmol/L — ABNORMAL HIGH (ref 0.0–2.0)
Bicarbonate: 27.7 mmol/L (ref 20.0–28.0)
Calcium, Ion: 1.17 mmol/L (ref 1.15–1.40)
HCT: 31 % — ABNORMAL LOW (ref 39.0–52.0)
Hemoglobin: 10.5 g/dL — ABNORMAL LOW (ref 13.0–17.0)
O2 Saturation: 99 %
Potassium: 3.9 mmol/L (ref 3.5–5.1)
Sodium: 140 mmol/L (ref 135–145)
TCO2: 29 mmol/L (ref 22–32)
pCO2, Ven: 42.3 mm[Hg] — ABNORMAL LOW (ref 44–60)
pH, Ven: 7.425 (ref 7.25–7.43)
pO2, Ven: 128 mm[Hg] — ABNORMAL HIGH (ref 32–45)

## 2023-08-20 LAB — COMPREHENSIVE METABOLIC PANEL
ALT: 9 U/L (ref 0–44)
AST: 20 U/L (ref 15–41)
Albumin: 2.9 g/dL — ABNORMAL LOW (ref 3.5–5.0)
Alkaline Phosphatase: 98 U/L (ref 38–126)
Anion gap: 10 (ref 5–15)
BUN: 18 mg/dL (ref 8–23)
CO2: 26 mmol/L (ref 22–32)
Calcium: 9.1 mg/dL (ref 8.9–10.3)
Chloride: 103 mmol/L (ref 98–111)
Creatinine, Ser: 0.79 mg/dL (ref 0.61–1.24)
GFR, Estimated: >60 mL/min (ref >60)
Glucose, Bld: 120 mg/dL — ABNORMAL HIGH (ref 70–99)
Potassium: 3.9 mmol/L (ref 3.5–5.1)
Sodium: 139 mmol/L (ref 135–145)
Total Bilirubin: 0.6 mg/dL (ref <1.2)
Total Protein: 6.5 g/dL (ref 6.5–8.1)

## 2023-08-20 LAB — TROPONIN I (HIGH SENSITIVITY)
Troponin I (High Sensitivity): 8 ng/L (ref <18)
Troponin I (High Sensitivity): 11 ng/L (ref <18)

## 2023-08-20 LAB — RESP PANEL BY RT-PCR (RSV, FLU A&B, COVID)  RVPGX2
Influenza A by PCR: NEGATIVE
Influenza B by PCR: NEGATIVE
Resp Syncytial Virus by PCR: NEGATIVE
SARS Coronavirus 2 by RT PCR: NEGATIVE

## 2023-08-20 LAB — BRAIN NATRIURETIC PEPTIDE: B Natriuretic Peptide: 149.2 pg/mL — ABNORMAL HIGH (ref 0.0–100.0)

## 2023-08-20 MED ORDER — IPRATROPIUM-ALBUTEROL 0.5-2.5 (3) MG/3ML IN SOLN
3.0000 mL | RESPIRATORY_TRACT | Status: AC
  Administered 2023-08-20: 3 mL via RESPIRATORY_TRACT
  Filled 2023-08-20: qty 3

## 2023-08-20 MED ORDER — IPRATROPIUM-ALBUTEROL 0.5-2.5 (3) MG/3ML IN SOLN
3.0000 mL | Freq: Once | RESPIRATORY_TRACT | Status: AC
  Administered 2023-08-20: 3 mL via RESPIRATORY_TRACT
  Filled 2023-08-20: qty 3

## 2023-08-20 MED ORDER — ENSURE ENLIVE PO LIQD: 237.0000 mL | Freq: Two times a day (BID) | ORAL | Status: DC

## 2023-08-20 MED ORDER — AZITHROMYCIN 500 MG PO TABS
500.0000 mg | ORAL_TABLET | Freq: Every day | ORAL | Status: DC
  Administered 2023-08-20 – 2023-08-22 (×3): 500 mg via ORAL
  Filled 2023-08-20 (×2): qty 1

## 2023-08-20 MED ORDER — MORPHINE SULFATE ER 15 MG PO TBCR
15.0000 mg | EXTENDED_RELEASE_TABLET | Freq: Two times a day (BID) | ORAL | Status: DC
  Administered 2023-08-20 – 2023-08-22 (×4): 15 mg via ORAL
  Filled 2023-08-20 (×4): qty 1

## 2023-08-20 MED ORDER — DONEPEZIL HCL 10 MG PO TABS
5.0000 mg | ORAL_TABLET | Freq: Every day | ORAL | Status: DC
  Administered 2023-08-21 – 2023-08-22 (×2): 5 mg via ORAL
  Filled 2023-08-20 (×2): qty 1

## 2023-08-20 MED ORDER — FOLIC ACID 1 MG PO TABS
1.0000 mg | ORAL_TABLET | Freq: Every day | ORAL | Status: DC
  Administered 2023-08-21 – 2023-08-22 (×2): 1 mg via ORAL
  Filled 2023-08-20 (×2): qty 1

## 2023-08-20 MED ORDER — PREDNISONE 10 MG PO TABS
40.0000 mg | ORAL_TABLET | Freq: Every day | ORAL | Status: DC
  Administered 2023-08-21 – 2023-08-22 (×2): 40 mg via ORAL
  Filled 2023-08-20 (×2): qty 4

## 2023-08-20 MED ORDER — GABAPENTIN 400 MG PO CAPS
400.0000 mg | ORAL_CAPSULE | Freq: Two times a day (BID) | ORAL | Status: DC
  Administered 2023-08-20 – 2023-08-22 (×4): 400 mg via ORAL
  Filled 2023-08-20 (×4): qty 1

## 2023-08-20 MED ORDER — ACETAMINOPHEN 500 MG PO TABS
1000.0000 mg | ORAL_TABLET | Freq: Four times a day (QID) | ORAL | Status: DC | PRN
  Administered 2023-08-20 – 2023-08-21 (×3): 1000 mg via ORAL
  Filled 2023-08-20 (×4): qty 2

## 2023-08-20 MED ORDER — MAGNESIUM SULFATE 2 GM/50ML IV SOLN
2.0000 g | Freq: Once | INTRAVENOUS | Status: AC
  Administered 2023-08-20: 2 g via INTRAVENOUS
  Filled 2023-08-20: qty 50

## 2023-08-20 MED ORDER — HYDROMORPHONE HCL 1 MG/ML IJ SOLN
1.0000 mg | Freq: Once | INTRAMUSCULAR | Status: AC
  Administered 2023-08-20: 1 mg via INTRAVENOUS
  Filled 2023-08-20: qty 1

## 2023-08-20 MED ORDER — OXYCODONE-ACETAMINOPHEN 5-325 MG PO TABS
2.0000 | ORAL_TABLET | Freq: Once | ORAL | Status: AC
  Administered 2023-08-20: 2 via ORAL
  Filled 2023-08-20: qty 2

## 2023-08-20 MED ORDER — ENOXAPARIN SODIUM 40 MG/0.4ML IJ SOSY
40.0000 mg | PREFILLED_SYRINGE | INTRAMUSCULAR | Status: DC
  Administered 2023-08-20 – 2023-08-21 (×2): 40 mg via SUBCUTANEOUS
  Filled 2023-08-20 (×3): qty 0.4

## 2023-08-20 MED ORDER — ASPIRIN 81 MG PO TBEC
81.0000 mg | DELAYED_RELEASE_TABLET | Freq: Every day | ORAL | Status: DC
  Administered 2023-08-21 – 2023-08-22 (×2): 81 mg via ORAL
  Filled 2023-08-20 (×2): qty 1

## 2023-08-20 MED ORDER — MOMETASONE FURO-FORMOTEROL FUM 200-5 MCG/ACT IN AERO
2.0000 | INHALATION_SPRAY | Freq: Two times a day (BID) | RESPIRATORY_TRACT | Status: DC
  Administered 2023-08-21: 2 via RESPIRATORY_TRACT
  Filled 2023-08-20: qty 8.8

## 2023-08-20 MED ORDER — GUAIFENESIN ER 600 MG PO TB12: 600.0000 mg | ORAL_TABLET | Freq: Two times a day (BID) | ORAL | Status: DC | PRN

## 2023-08-20 MED ORDER — ROSUVASTATIN CALCIUM 20 MG PO TABS
40.0000 mg | ORAL_TABLET | Freq: Every evening | ORAL | Status: DC
  Administered 2023-08-21: 40 mg via ORAL
  Filled 2023-08-20: qty 2

## 2023-08-20 MED ORDER — VITAMIN B-12 1000 MCG PO TABS
500.0000 ug | ORAL_TABLET | Freq: Two times a day (BID) | ORAL | Status: DC
  Administered 2023-08-20 – 2023-08-22 (×4): 500 ug via ORAL
  Filled 2023-08-20 (×4): qty 1

## 2023-08-20 MED ORDER — MONTELUKAST SODIUM 10 MG PO TABS
10.0000 mg | ORAL_TABLET | Freq: Every day | ORAL | Status: DC
  Administered 2023-08-20 – 2023-08-21 (×2): 10 mg via ORAL
  Filled 2023-08-20 (×2): qty 1

## 2023-08-20 MED ORDER — ESCITALOPRAM OXALATE 10 MG PO TABS
20.0000 mg | ORAL_TABLET | Freq: Every morning | ORAL | Status: DC
  Administered 2023-08-21 – 2023-08-22 (×2): 20 mg via ORAL
  Filled 2023-08-20 (×2): qty 2

## 2023-08-20 MED ORDER — IBUPROFEN 200 MG PO TABS
800.0000 mg | ORAL_TABLET | Freq: Four times a day (QID) | ORAL | Status: DC | PRN
  Administered 2023-08-21 – 2023-08-22 (×3): 800 mg via ORAL
  Filled 2023-08-20 (×3): qty 4

## 2023-08-20 MED ORDER — IOHEXOL 350 MG/ML SOLN
75.0000 mL | Freq: Once | INTRAVENOUS | Status: AC | PRN
  Administered 2023-08-20: 75 mL via INTRAVENOUS

## 2023-08-20 MED ORDER — IBUPROFEN 200 MG PO TABS: 800.0000 mg | ORAL_TABLET | Freq: Four times a day (QID) | ORAL | Status: DC | PRN

## 2023-08-20 MED ORDER — ALBUTEROL SULFATE (2.5 MG/3ML) 0.083% IN NEBU
2.5000 mg | INHALATION_SOLUTION | RESPIRATORY_TRACT | Status: DC | PRN
  Administered 2023-08-21: 2.5 mg via RESPIRATORY_TRACT
  Filled 2023-08-20 (×2): qty 3

## 2023-08-20 MED ORDER — DEXTROSE 5 % IV SOLN: 500.0000 mg | INTRAVENOUS | Status: AC

## 2023-08-20 MED ORDER — OXYCODONE-ACETAMINOPHEN 5-325 MG PO TABS
1.0000 | ORAL_TABLET | ORAL | Status: DC | PRN
  Administered 2023-08-21 – 2023-08-22 (×7): 1 via ORAL
  Filled 2023-08-20 (×7): qty 1

## 2023-08-20 NOTE — Assessment & Plan Note (Signed)
HGB 10.6, c/w prior baseline.

## 2023-08-20 NOTE — H&P (Signed)
History and Physical    Patient: [name] WNU:[id number] DOB: 1946/04/10 DOA: 08/20/2023 DOS: the patient was seen and examined on 08/20/2023 PCP: [facility]  Patient coming from: Home  Chief Complaint:  Chief Complaint  Patient presents with   Shortness of Breath   HPI: Previn R Schupp is a 77 y.o. male with medical history significant of Lung CA s/p radiation at [place], COPD.  Pt with admit for COPD exacerbation to [facility] around middle of last month.  Pt in to ED with wheezing, increased WOB.  Put on 2L via [place].  CTA showed increasing size lung mass c/w progressive lung CA.   Review of Systems: As mentioned in the history of present illness. All other systems reviewed and are negative. Past Medical History:  Diagnosis Date   Anemia    Arthritis    Back pain    scoliosis   CAD (coronary artery disease) 03/24/2011   - S/p stent to RCA in 1999  - Cath in 2019 with patent stent and mod nonobstructive disease elsewhere  - Myoview 06/05/23: EF 61, normal perfusion, low risk     COPD (chronic obstructive pulmonary disease) (HCC)    Depression    Diverticulosis    GERD (gastroesophageal reflux disease)    GI bleed    History of colon polyps    History of hiatal hernia    Hyperkalemia    Hyperlipidemia    Hypertension    Lung cancer (HCC) 06/04/2023   Radiation Rx; [facility]    Scoliosis    Tobacco abuse    Past Surgical History:  Procedure Laterality Date   APPENDECTOMY  1957   BACK SURGERY     CARDIAC CATHETERIZATION  1999   stent placement RCA   CARDIAC CATHETERIZATION  2010   mild left CA distal stenosis, mild irregularities in RCA and LAD. patent setn in RCA   COLONOSCOPY WITH PROPOFOL N/A 06/16/2018   Procedure: COLONOSCOPY WITH PROPOFOL;  Surgeon: Toledo, Boykin Nearing, MD;  Location: [facility] ENDOSCOPY;  Service: Gastroenterology;  Laterality: N/A;   CORONARY ANGIOPLASTY     CORONARY ARTERY BYPASS GRAFT     denies 09/30/19    ESOPHAGOGASTRODUODENOSCOPY (EGD) WITH PROPOFOL N/A 06/16/2018   Procedure: ESOPHAGOGASTRODUODENOSCOPY (EGD) WITH PROPOFOL;  Surgeon: Toledo, Boykin Nearing, MD;  Location: [facility] ENDOSCOPY;  Service: Gastroenterology;  Laterality: N/A;   FOOT SURGERY  1973   left   LEFT HEART CATH AND CORONARY ANGIOGRAPHY N/A 01/22/2018   Procedure: LEFT HEART CATH AND CORONARY ANGIOGRAPHY;  Surgeon: Kathleene Hazel, MD;  Location: [facility] INVASIVE CV LAB;  Service: Cardiovascular;  Laterality: N/A;   LEFT HEART CATHETERIZATION WITH CORONARY ANGIOGRAM N/A 01/11/2015   Procedure: LEFT HEART CATHETERIZATION WITH CORONARY ANGIOGRAM;  Surgeon: Kathleene Hazel, MD;  Location: [facility] CATH LAB;  Service: Cardiovascular;  Laterality: N/A;   OLECRANON BURSECTOMY Right 10/04/2019   Procedure: OLECRANON BURSA;  Surgeon: Signa Kell, MD;  Location: [facility] ORS;  Service: Orthopedics;  Laterality: Right;   TRICEPS TENDON REPAIR Right 10/04/2019   Procedure: TRICEPS TENDON REPAIR AND PLECRANON BURSA EXCISION, POSSIBLE ALLOGRAFT AUGMENTATION.;  Surgeon: Signa Kell, MD;  Location: [facility] ORS;  Service: Orthopedics;  Laterality: Right;   Social History:  reports that he has been smoking cigarettes. He has a 17.5 pack-year smoking history. He has never used smokeless tobacco. He reports current alcohol use of about 6.0 standard drinks of alcohol per week. He reports that he does not use drugs.  Allergies  Allergen Reactions   Levofloxacin Itching and Rash   Doxycycline Nausea And Vomiting    Family History  Problem Relation Age of Onset   Alzheimer's disease Father    Angina Father    Diabetes Cousin        mothers side   Heart attack Cousin        fathers side   Hyperlipidemia Cousin        fathers side   Hypertension Cousin        fathers side    Prior to Admission medications   Medication Sig Start Date End Date Taking? Authorizing Provider  amoxicillin-clavulanate (AUGMENTIN) 875-125 MG tablet Take 1 tablet by mouth  2 (two) times daily. 07/28/23  Yes [provider]  aspirin EC 81 MG tablet Take 1 tablet (81 mg total) by mouth daily. 02/24/18   Robbie Lis M, PA-C  cyanocobalamin 500 MCG tablet Take 500 mcg by mouth 2 (two) times daily. VITAMIN B-12 08/18/14   [provider]  donepezil (ARICEPT) 10 MG tablet Take 5 mg by mouth daily. 08/31/21   [provider]  escitalopram (LEXAPRO) 20 MG tablet Take 1 tablet (20 mg total) by mouth daily. 04/12/14   Kathleene Hazel, MD  feeding supplement (ENSURE ENLIVE / ENSURE PLUS) LIQD Take 237 mLs by mouth 3 (three) times daily between meals. 07/30/23   Enedina Finner, MD  folic acid (FOLVITE) 1 MG tablet Take 1 mg by mouth daily. 10/01/21   [provider]  gabapentin (NEURONTIN) 400 MG capsule Take 400 mg by mouth at bedtime.     [provider]  ipratropium-albuterol (DUONEB) 0.5-2.5 (3) MG/3ML SOLN Take 3 mLs by nebulization 2 (two) times daily. 07/30/23   Enedina Finner, MD  montelukast (SINGULAIR) 10 MG tablet TAKE 1 TABLET AT BEDTIME 04/28/20   Johnna Acosta, NP  morphine (MS CONTIN) 15 MG 12 hr tablet Take 15 mg by mouth 2 (two) times daily.    [provider]  Multiple Vitamin (MULTIVITAMIN WITH MINERALS) TABS tablet Take 1 tablet by mouth daily. 07/31/23   Enedina Finner, MD  nicotine (NICODERM CQ - DOSED IN MG/24 HOURS) 21 mg/24hr patch Place 1 patch (21 mg total) onto the skin daily as needed (nicotine craving). 07/30/23   Enedina Finner, MD  oxyCODONE-acetaminophen (PERCOCET) 5-325 MG per tablet Take 1 tablet by mouth every 4 (four) hours as needed for moderate pain. For pain    [provider]  potassium & sodium phosphates (PHOS-NAK) 280-160-250 MG PACK Take 1 packet by mouth 4 (four) times daily -  with meals and at bedtime. 06/20/23   Marrion Coy, MD  predniSONE (DELTASONE) 10 MG tablet Take 10 mg by mouth daily. 08/13/23   [provider]  rosuvastatin (CRESTOR) 40 MG tablet Take 1  tablet (40 mg total) by mouth daily. 06/04/23   Tereso Newcomer T, PA-C  Vitamin D, Ergocalciferol, (DRISDOL) 50000 UNITS CAPS capsule Take 50,000 Units by mouth every Wednesday.  12/15/14   [provider]    Physical Exam: Vitals:   08/20/23 1500 08/20/23 1530 08/20/23 1630 08/20/23 1638  BP: (!) 142/60 139/69 (!) 149/61   Pulse: 70 68 74   Resp: 15 17 13    Temp:    98.2 F (36.8 C)  TempSrc:      SpO2: 98% 96% 95%   Weight:      Height:       Constitutional: NAD, calm, comfortable Respiratory: Wheezing Cardiovascular: Regular rate and rhythm,  no murmurs / rubs / gallops. No extremity edema. 2+ pedal pulses. No carotid bruits.  Abdomen: no tenderness, no masses palpated. No hepatosplenomegaly. Bowel sounds positive.  Neurologic: CN 2-12 grossly intact. Sensation intact, DTR normal. Strength 5/5 in all 4.  Psychiatric: Normal judgment and insight. Alert and oriented x 3. Normal mood.   Data Reviewed:    Labs on Admission: I have personally reviewed following labs and imaging studies  CBC: Recent Labs  Lab 08/20/23 1316 08/20/23 1421  WBC 4.3  --   NEUTROABS 3.6  --   HGB 10.7* 10.5*  HCT 33.7* 31.0*  MCV 98.3  --   PLT 169  --    Basic Metabolic Panel: Recent Labs  Lab 08/20/23 1316 08/20/23 1421  NA 139 140  K 3.9 3.9  CL 103  --   CO2 26  --   GLUCOSE 120*  --   BUN 18  --   CREATININE 0.79  --   CALCIUM 9.1  --    GFR: Estimated Creatinine Clearance: 59.3 mL/min (by C-G formula based on SCr of 0.79 mg/dL). Liver Function Tests: Recent Labs  Lab 08/20/23 1316  AST 20  ALT 9  ALKPHOS 98  BILITOT 0.6  PROT 6.5  ALBUMIN 2.9*   No results for input(s): "LIPASE", "AMYLASE" in the last 168 hours. No results for input(s): "AMMONIA" in the last 168 hours. Coagulation Profile: No results for input(s): "INR", "PROTIME" in the last 168 hours. Cardiac Enzymes: No results for input(s): "CKTOTAL", "CKMB", "CKMBINDEX", "TROPONINI" in the last 168  hours. BNP (last 3 results) No results for input(s): "PROBNP" in the last 8760 hours. HbA1C: No results for input(s): "HGBA1C" in the last 72 hours. CBG: No results for input(s): "GLUCAP" in the last 168 hours. Lipid Profile: No results for input(s): "CHOL", "HDL", "LDLCALC", "TRIG", "CHOLHDL", "LDLDIRECT" in the last 72 hours. Thyroid Function Tests: No results for input(s): "TSH", "T4TOTAL", "FREET4", "T3FREE", "THYROIDAB" in the last 72 hours. Anemia Panel: No results for input(s): "VITAMINB12", "FOLATE", "FERRITIN", "TIBC", "IRON", "RETICCTPCT" in the last 72 hours. Urine analysis:    Component Value Date/Time   COLORURINE AMBER (A) 07/28/2023 1217   APPEARANCEUR HAZY (A) 07/28/2023 1217   LABSPEC 1.029 07/28/2023 1217   PHURINE 5.0 07/28/2023 1217   GLUCOSEU NEGATIVE 07/28/2023 1217   HGBUR NEGATIVE 07/28/2023 1217   BILIRUBINUR NEGATIVE 07/28/2023 1217   KETONESUR 80 (A) 07/28/2023 1217   PROTEINUR 100 (A) 07/28/2023 1217   NITRITE NEGATIVE 07/28/2023 1217   LEUKOCYTESUR NEGATIVE 07/28/2023 1217    Radiological Exams on Admission: DG Chest Port 1 View  Result Date: 08/20/2023 CLINICAL DATA:  Short of breath EXAM: PORTABLE CHEST 1 VIEW COMPARISON:  07/28/2023 FINDINGS: Single frontal view of the chest demonstrates a stable cardiac silhouette. Masslike consolidation within the left lower lobe compatible with known history of malignancy. Background emphysema. No acute airspace disease, effusion, or pneumothorax. IMPRESSION: 1. Continued left lower lobe masslike consolidation compatible with known history of malignancy. Please refer to concurrently performed CT chest exam for findings related to progressive bilateral lung cancer. 2. Emphysema.  No evidence of acute airspace disease. Electronically Signed   By: Sharlet Salina M.D.   On: 08/20/2023 18:17   CT Angio Chest PE W and/or Wo Contrast  Result Date: 08/20/2023 CLINICAL DATA:  Short of breath since 3 a.m., history of lung  cancer EXAM: CT ANGIOGRAPHY CHEST WITH CONTRAST TECHNIQUE: Multidetector CT imaging of the chest was performed using the standard protocol during bolus  administration of intravenous contrast. Multiplanar CT image reconstructions and MIPs were obtained to evaluate the vascular anatomy. RADIATION DOSE REDUCTION: This exam was performed according to the departmental dose-optimization program which includes automated exposure control, adjustment of the mA and/or kV according to patient size and/or use of iterative reconstruction technique. CONTRAST:  75mL OMNIPAQUE IOHEXOL 350 MG/ML SOLN COMPARISON:  07/28/2023, 01/02/2023 FINDINGS: Cardiovascular: This is a technically adequate evaluation of the pulmonary vasculature. No filling defects or pulmonary emboli. The heart is unremarkable without significant pericardial fluid. No evidence of thoracic aortic aneurysm or dissection. Stable atherosclerosis of the aorta and coronary vasculature. Mediastinum/Nodes: Mediastinal adenopathy again identified. Index lymph nodes in the AP window measuring 2.1 cm is and in the subcarinal region measuring 1.6 cm are unchanged by my measurement. No new adenopathy. Thyroid, trachea, and esophagus are unremarkable. Lungs/Pleura: A spiculated mass has developed in the right upper lobe abutting the mediastinum, measuring 2.4 x 2.0 cm reference image 58/6. Increasing spiculated peripheral masslike consolidation within the left lower lobe, now measuring 3.0 x 4.4 cm reference image 116/6. Findings are consistent with progressive bilateral lung cancer. There is severe background emphysema again noted. There is no evidence of acute airspace disease. Trace partially loculated left pleural effusion is noted within the medial costophrenic angle. No pneumothorax. Bilateral bronchial wall thickening, most pronounced within the left lower lobe. Upper Abdomen: No acute abnormality. Musculoskeletal: No acute displaced fracture. Postsurgical changes are  seen within the lumbar spine. Reconstructed images demonstrate no additional findings. Review of the MIP images confirms the above findings. IMPRESSION: 1. No evidence of pulmonary embolus. 2. Enlarging left lower lobe spiculated mass, with a new right upper lobe paramediastinal spiculated nodule, consistent with progressive lung cancer. 3. Stable mediastinal adenopathy, concerning for nodal metastases. 4. Bilateral bronchial wall thickening, most pronounced within the left lower lobe, consistent with bronchitis. 5. Trace partially loculated left pleural effusion. 6. Aortic Atherosclerosis (ICD10-I70.0) and Emphysema (ICD10-J43.9). Electronically Signed   By: Sharlet Salina M.D.   On: 08/20/2023 18:16    EKG: Independently reviewed.   Assessment and Plan: * COPD exacerbation (HCC) COPD pathway Rocephin PRN SABA Scheduled LABA, LAMA, and INH steroid Prednisone  Lung cancer (HCC) Followed at [facility], s/p radiation Enlarging lung masses c/w worsening lung CA on todays CT chest. Turns out that its already known that his lung CA is progressing and [facility] to his COPD exacerbation requiring admission. After discussing with patient, will defer further work up and management to [place] oncologists who are already seeing him / aware of progressive lung CA rather than getting our onc guys involved.  Narcotic dependency, continuous (HCC) Re order home opiates once med rec is completed.  Anemia HGB 10.6, c/w prior baseline.      Advance Care Planning:   Code Status: Full Code  Consults: None  Family Communication: No family in room  Severity of Illness: The appropriate patient status for this patient is OBSERVATION. Observation status is judged to be reasonable and necessary in order to provide the required intensity of service to ensure the patient's safety. The patient's presenting symptoms, physical exam findings, and initial  radiographic and laboratory data in the context of their medical condition is felt to place them at decreased risk for further clinical deterioration. Furthermore, it is anticipated that the patient will be medically stable for discharge from the hospital within 2 midnights of admission.   Author: Hillary Bow.,  DO 08/20/2023 7:28 PM  For on call review [email or web address].

## 2023-08-20 NOTE — Assessment & Plan Note (Signed)
COPD pathway Rocephin PRN SABA Scheduled LABA, LAMA, and INH steroid Prednisone

## 2023-08-20 NOTE — Assessment & Plan Note (Signed)
Re order home opiates once med rec is completed.

## 2023-08-20 NOTE — ED Notes (Signed)
ED TO INPATIENT HANDOFF REPORT  ED Nurse Name and Phone #: [phone]  S Name/Age/Gender Deni R Robleto 77 y.o. male Room/Bed: 029C/029C  Code Status   Code Status: Full Code  Home/SNF/Other Home Patient oriented to: self, place, time, and situation Is this baseline? Yes   Triage Complete: Triage complete  Chief Complaint COPD exacerbation (HCC) [J44.1]  Triage Note Pt BIB [facility] from home due to shortness of breath that started at 0300 this morning.  Pt normally not on oxygen at home.  Pt reports he took 4 albuterol treatments with no relief. Pt has lung cancer that started on the right lung and now has traveled to the left lung.  [facility] did give 125 solumedrol en route.  18g left AC.  VS BP 130/80, HR 72, SpO2 89% RA;  pt currently on 2L oxygen nasal  cannula.   Allergies Allergies  Allergen Reactions   Levofloxacin Itching and Rash   Doxycycline Nausea And Vomiting    Level of Care/Admitting Diagnosis ED Disposition     ED Disposition  Admit   Condition  --   Comment  Hospital Area: [facility] [[id number]]  Level of Care: Med-Surg [16]  May place patient in observation at [facility] or [facility] if equivalent level of care is available:: Yes  Covid Evaluation: Asymptomatic - no recent exposure (last 10 days) testing not required  Diagnosis: COPD exacerbation [facility]) [[id number]]  Admitting Physician: Hillary Bow [phone]  Attending Physician: Hillary Bow [phone]          B Medical/Surgery History Past Medical History:  Diagnosis Date   Anemia    Arthritis    Back pain    scoliosis   CAD (coronary artery disease) 03/24/2011   - S/p stent to RCA in 1999  - Cath in 2019 with patent stent and mod nonobstructive disease elsewhere  - Myoview 06/05/23: EF 61, normal perfusion, low risk     COPD (chronic obstructive pulmonary disease) (HCC)    Depression    Diverticulosis    GERD (gastroesophageal reflux disease)    GI bleed    History of  colon polyps    History of hiatal hernia    Hyperkalemia    Hyperlipidemia    Hypertension    Lung cancer (HCC) 06/04/2023   Radiation Rx; [facility]    Scoliosis    Tobacco abuse    Past Surgical History:  Procedure Laterality Date   APPENDECTOMY  1957   BACK SURGERY     CARDIAC CATHETERIZATION  1999   stent placement RCA   CARDIAC CATHETERIZATION  2010   mild left CA distal stenosis, mild irregularities in RCA and LAD. patent setn in RCA   COLONOSCOPY WITH PROPOFOL N/A 06/16/2018   Procedure: COLONOSCOPY WITH PROPOFOL;  Surgeon: Toledo, Boykin Nearing, MD;  Location: [facility] ENDOSCOPY;  Service: Gastroenterology;  Laterality: N/A;   CORONARY ANGIOPLASTY     CORONARY ARTERY BYPASS GRAFT     denies 09/30/19   ESOPHAGOGASTRODUODENOSCOPY (EGD) WITH PROPOFOL N/A 06/16/2018   Procedure: ESOPHAGOGASTRODUODENOSCOPY (EGD) WITH PROPOFOL;  Surgeon: Toledo, Boykin Nearing, MD;  Location: [facility] ENDOSCOPY;  Service: Gastroenterology;  Laterality: N/A;   FOOT SURGERY  1973   left   LEFT HEART CATH AND CORONARY ANGIOGRAPHY N/A 01/22/2018   Procedure: LEFT HEART CATH AND CORONARY ANGIOGRAPHY;  Surgeon: Kathleene Hazel, MD;  [facility] INVASIVE CV LAB;  Service: Cardiovascular;  Laterality: N/A;   LEFT HEART CATHETERIZATION WITH CORONARY ANGIOGRAM  N/A 01/11/2015   Procedure: LEFT HEART CATHETERIZATION WITH CORONARY ANGIOGRAM;  Surgeon: Kathleene Hazel, MD;  Location: [facility] CATH LAB;  Service: Cardiovascular;  Laterality: N/A;   OLECRANON BURSECTOMY Right 10/04/2019   Procedure: OLECRANON BURSA;  Surgeon: Signa Kell, MD;  Location: [facility] ORS;  Service: Orthopedics;  Laterality: Right;   TRICEPS TENDON REPAIR Right 10/04/2019   Procedure: TRICEPS TENDON REPAIR AND PLECRANON BURSA EXCISION, POSSIBLE ALLOGRAFT AUGMENTATION.;  Surgeon: Signa Kell, MD;  Location: [facility] ORS;  Service: Orthopedics;  Laterality: Right;     A IV Location/Drains/Wounds Patient Lines/Drains/Airways Status      Active Line/Drains/Airways     Name Placement date Placement time Site Days   Peripheral IV 08/20/23 18 G Left Antecubital 08/20/23  1245  Antecubital  less than 1            Intake/Output Last 24 hours No intake or output data in the 24 hours ending 08/20/23 2030  Labs/Imaging Results for orders placed or performed during the hospital encounter of 08/20/23 (from the past 48 hour(s))  CBC with Differential     Status: Abnormal   Collection Time: 08/20/23  1:16 PM  Result Value Ref Range   WBC 4.3 4.0 - 10.5 K/uL   RBC 3.43 (L) 4.22 - 5.81 MIL/uL   Hemoglobin 10.7 (L) 13.0 - 17.0 g/dL   HCT 46.9 (L) 62.9 - 52.8 %   MCV 98.3 80.0 - 100.0 fL   MCH 31.2 26.0 - 34.0 pg   MCHC 31.8 30.0 - 36.0 g/dL   RDW 41.3 24.4 - 01.0 %   Platelets 169 150 - 400 K/uL   nRBC 0.0 0.0 - 0.2 %   Neutrophils Relative % 84 %   Neutro Abs 3.6 1.7 - 7.7 K/uL   Lymphocytes Relative 10 %   Lymphs Abs 0.4 (L) 0.7 - 4.0 K/uL   Monocytes Relative 5 %   Monocytes Absolute 0.2 0.1 - 1.0 K/uL   Eosinophils Relative 0 %   Eosinophils Absolute 0.0 0.0 - 0.5 K/uL   Basophils Relative 0 %   Basophils Absolute 0.0 0.0 - 0.1 K/uL   Immature Granulocytes 1 %   Abs Immature Granulocytes 0.02 0.00 - 0.07 K/uL    Comment: Performed at [facility], [address], [place], [place] [address]  Comprehensive metabolic panel     Status: Abnormal   Collection Time: 08/20/23  1:16 PM  Result Value Ref Range   Sodium 139 135 - 145 mmol/L   Potassium 3.9 3.5 - 5.1 mmol/L   Chloride 103 98 - 111 mmol/L   CO2 26 22 - 32 mmol/L   Glucose, Bld 120 (H) 70 - 99 mg/dL    Comment: Glucose reference range applies only to samples taken after fasting for at least 8 hours.   BUN 18 8 - 23 mg/dL   Creatinine, Ser 6.64 0.61 - 1.24 mg/dL   Calcium 9.1 8.9 - 40.3 mg/dL   Total Protein 6.5 6.5 - 8.1 g/dL   Albumin 2.9 (L) 3.5 - 5.0 g/dL   AST 20 15 - 41 U/L   ALT 9 0 - 44 U/L   Alkaline Phosphatase 98 38 - 126 U/L   Total  Bilirubin 0.6 <1.2 mg/dL   GFR, Estimated >47 >42 mL/min    Comment: (NOTE) Calculated using the CKD-EPI Creatinine Equation (2021)    Anion gap 10 5 - 15    Comment: Performed at [facility], [address], [place],  [place] [address]  Brain natriuretic peptide     Status: Abnormal   Collection Time: 08/20/23  1:16 PM  Result Value Ref Range   B Natriuretic Peptide 149.2 (H) 0.0 - 100.0 pg/mL    Comment: Performed at [facility], [address] [place], [place] [address]  Troponin I (High Sensitivity)     Status: None   Collection Time: 08/20/23  1:16 PM  Result Value Ref Range   Troponin I (High Sensitivity) 8 <18 ng/L    Comment: (NOTE) Elevated high sensitivity troponin I (hsTnI) values and significant  changes across serial measurements may suggest ACS but many other  chronic and acute conditions are known to elevate hsTnI results.  Refer to the "Links" section for chest pain algorithms and additional  guidance. Performed at [facility], [address], [place], [place] [address]   Resp panel by RT-PCR (RSV, Flu A&B, Covid) Anterior Nasal Swab     Status: None   Collection Time: 08/20/23  1:17 PM   Specimen: Anterior Nasal Swab  Result Value Ref Range   SARS Coronavirus 2 by RT PCR NEGATIVE NEGATIVE   Influenza A by PCR NEGATIVE NEGATIVE   Influenza B by PCR NEGATIVE NEGATIVE    Comment: (NOTE) The Xpert Xpress SARS-CoV-2/FLU/RSV plus assay is intended as an aid in the diagnosis of influenza from Nasopharyngeal swab specimens and should not be used as a sole basis for treatment. Nasal washings and aspirates are unacceptable for Xpert Xpress SARS-CoV-2/FLU/RSV testing.  Fact Sheet for Patients: BloggerCourse.com  Fact Sheet for Healthcare Providers: SeriousBroker.it  This test is not yet approved or cleared by the [place] FDA and has been authorized for detection and/or diagnosis of  SARS-CoV-2 by FDA under an Emergency Use Authorization (EUA). This EUA will remain in effect (meaning this test can be used) for the duration of the COVID-19 declaration under Section 564(b)(1) of the Act, 21 U.S.C. section 360bbb-3(b)(1), unless the authorization is terminated or revoked.     Resp Syncytial Virus by PCR NEGATIVE NEGATIVE    Comment: (NOTE) Fact Sheet for Patients: BloggerCourse.com  Fact Sheet for Healthcare Providers: SeriousBroker.it  This test is not yet approved or cleared by the [place] FDA and has been authorized for detection and/or diagnosis of SARS-CoV-2 by FDA under an Emergency Use Authorization (EUA). This EUA will remain in effect (meaning this test can be used) for the duration of the COVID-19 declaration under Section 564(b)(1) of the Act, 21 U.S.C. section 360bbb-3(b)(1), unless the authorization is terminated or revoked.  Performed at [facility], [address], [place], [place] [address]   I-Stat venous blood gas, [facility], [facility])     Status: Abnormal   Collection Time: 08/20/23  2:21 PM  Result Value Ref Range   pH, Ven 7.425 7.25 - 7.43   pCO2, Ven 42.3 (L) 44 - 60 mmHg   pO2, Ven 128 (H) 32 - 45 mmHg   Bicarbonate 27.7 20.0 - 28.0 mmol/L   TCO2 29 22 - 32 mmol/L   O2 Saturation 99 %   Acid-Base Excess 3.0 (H) 0.0 - 2.0 mmol/L   Sodium 140 135 - 145 mmol/L   Potassium 3.9 3.5 - 5.1 mmol/L   Calcium, Ion 1.17 1.15 - 1.40 mmol/L   HCT 31.0 (L) 39.0 - 52.0 %   Hemoglobin 10.5 (L) 13.0 - 17.0 g/dL   Sample type VENOUS   Troponin I (High Sensitivity)     Status:  None   Collection Time: 08/20/23  3:56 PM  Result Value Ref Range   Troponin I (High Sensitivity) 11 <18 ng/L    Comment: (NOTE) Elevated high sensitivity troponin I (hsTnI) values and significant  changes across serial measurements may suggest ACS but many other  chronic and acute conditions are known to elevate  hsTnI results.  Refer to the "Links" section for chest pain algorithms and additional  guidance. Performed at [facility], [address], [place], [place] [address]    DG Chest Port 1 View  Result Date: 08/20/2023 CLINICAL DATA:  Short of breath EXAM: PORTABLE CHEST 1 VIEW COMPARISON:  07/28/2023 FINDINGS: Single frontal view of the chest demonstrates a stable cardiac silhouette. Masslike consolidation within the left lower lobe compatible with known history of malignancy. Background emphysema. No acute airspace disease, effusion, or pneumothorax. IMPRESSION: 1. Continued left lower lobe masslike consolidation compatible with known history of malignancy. Please refer to concurrently performed CT chest exam for findings related to progressive bilateral lung cancer. 2. Emphysema.  No evidence of acute airspace disease. Electronically Signed   By: Sharlet Salina M.D.   On: 08/20/2023 18:17   CT Angio Chest PE W and/or Wo Contrast  Result Date: 08/20/2023 CLINICAL DATA:  Short of breath since 3 a.m., history of lung cancer EXAM: CT ANGIOGRAPHY CHEST WITH CONTRAST TECHNIQUE: Multidetector CT imaging of the chest was performed using the standard protocol during bolus administration of intravenous contrast. Multiplanar CT image reconstructions and MIPs were obtained to evaluate the vascular anatomy. RADIATION DOSE REDUCTION: This exam was performed according to the departmental dose-optimization program which includes automated exposure control, adjustment of the mA and/or kV according to patient size and/or use of iterative reconstruction technique. CONTRAST:  75mL OMNIPAQUE IOHEXOL 350 MG/ML SOLN COMPARISON:  07/28/2023, 01/02/2023 FINDINGS: Cardiovascular: This is a technically adequate evaluation of the pulmonary vasculature. No filling defects or pulmonary emboli. The heart is unremarkable without significant pericardial fluid. No evidence of thoracic aortic aneurysm or dissection. Stable  atherosclerosis of the aorta and coronary vasculature. Mediastinum/Nodes: Mediastinal adenopathy again identified. Index lymph nodes in the AP window measuring 2.1 cm is and in the subcarinal region measuring 1.6 cm are unchanged by my measurement. No new adenopathy. Thyroid, trachea, and esophagus are unremarkable. Lungs/Pleura: A spiculated mass has developed in the right upper lobe abutting the mediastinum, measuring 2.4 x 2.0 cm reference image 58/6. Increasing spiculated peripheral masslike consolidation within the left lower lobe, now measuring 3.0 x 4.4 cm reference image 116/6. Findings are consistent with progressive bilateral lung cancer. There is severe background emphysema again noted. There is no evidence of acute airspace disease. Trace partially loculated left pleural effusion is noted within the medial costophrenic angle. No pneumothorax. Bilateral bronchial wall thickening, most pronounced within the left lower lobe. Upper Abdomen: No acute abnormality. Musculoskeletal: No acute displaced fracture. Postsurgical changes are seen within the lumbar spine. Reconstructed images demonstrate no additional findings. Review of the MIP images confirms the above findings. IMPRESSION: 1. No evidence of pulmonary embolus. 2. Enlarging left lower lobe spiculated mass, with a new right upper lobe paramediastinal spiculated nodule, consistent with progressive lung cancer. 3. Stable mediastinal adenopathy, concerning for nodal metastases. 4. Bilateral bronchial wall thickening, most pronounced within the left lower lobe, consistent with bronchitis. 5. Trace partially loculated left pleural effusion. 6. Aortic Atherosclerosis (ICD10-I70.0) and Emphysema (ICD10-J43.9). Electronically Signed   By: Sharlet Salina M.D.   On: 08/20/2023 18:16    Pending Labs Unresulted  Labs (From admission, onward)     Start     Ordered   08/21/23 0500  CBC  Tomorrow morning,   R        08/20/23 1928   08/21/23 0500  Basic  metabolic panel  Tomorrow morning,   R        08/20/23 1928   08/20/23 1927  HIV Antibody (routine testing w rflx)  (HIV Antibody (Routine testing w reflex) panel)  Once,   R        08/20/23 1928            Vitals/Pain Today's Vitals   08/20/23 1530 08/20/23 1627 08/20/23 1630 08/20/23 1638  BP: 139/69  (!) 149/61   Pulse: 68  74   Resp: 17  13   Temp:    98.2 F (36.8 C)  TempSrc:      SpO2: 96%  95%   Weight:      Height:      PainSc:  6       Isolation Precautions No active isolations  Medications Medications  ipratropium-albuterol (DUONEB) 0.5-2.5 (3) MG/3ML nebulizer solution 3 mL (3 mLs Nebulization Not Given 08/20/23 1403)  enoxaparin (LOVENOX) injection 40 mg (40 mg Subcutaneous Given 08/20/23 1940)  azithromycin (ZITHROMAX) 500 mg in dextrose 5 % 250 mL IVPB (500 mg Intravenous Not Given 08/20/23 1941)    Followed by  azithromycin (ZITHROMAX) tablet 500 mg (500 mg Oral Given 08/20/23 1940)  predniSONE (DELTASONE) tablet 40 mg (has no administration in time range)  mometasone-formoterol (DULERA) 200-5 MCG/ACT inhaler 2 puff (2 puffs Inhalation Not Given 08/20/23 1952)  albuterol (PROVENTIL) (2.5 MG/3ML) 0.083% nebulizer solution 2.5 mg (has no administration in time range)  oxyCODONE-acetaminophen (PERCOCET/ROXICET) 5-325 MG per tablet 1 tablet (has no administration in time range)  morphine (MS CONTIN) 12 hr tablet 15 mg (has no administration in time range)  magnesium sulfate IVPB 2 g 50 mL (0 g Intravenous Stopped 08/20/23 1720)  ipratropium-albuterol (DUONEB) 0.5-2.5 (3) MG/3ML nebulizer solution 3 mL (3 mLs Nebulization Given 08/20/23 1543)  oxyCODONE-acetaminophen (PERCOCET/ROXICET) 5-325 MG per tablet 2 tablet (2 tablets Oral Given 08/20/23 1558)  HYDROmorphone (DILAUDID) injection 1 mg (1 mg Intravenous Given 08/20/23 1558)  iohexol (OMNIPAQUE) 350 MG/ML injection 75 mL (75 mLs Intravenous Contrast Given 08/20/23 1621)    Mobility walks with person assist      Focused Assessments Pulmonary Assessment Handoff:  Lung sounds: Bilateral Breath Sounds: Expiratory wheezes L Breath Sounds: Expiratory wheezes O2 Device: Nasal Cannula O2 Flow Rate (L/min): 2 L/min    R Recommendations: See Admitting Provider Note  Report given to:   Additional Notes: full code with metastatic lung cancer, fall risk

## 2023-08-20 NOTE — ED Notes (Signed)
Phlebotomy to collect second troponin

## 2023-08-20 NOTE — ED Provider Notes (Signed)
  Physical Exam  BP (!) 149/61   Pulse 74   Temp 98.2 F (36.8 C)   Resp 13   Ht 5\' 8"  (1.727 m)   Wt 56 kg   SpO2 95%   BMI 18.77 kg/m   Physical Exam  Procedures  Procedures  ED Course / MDM   Clinical Course as of 08/20/23 1845  Wed Aug 20, 2023  1513 Troponin I (High Sensitivity): 8 neg [HN]  1513 Resp panel by RT-PCR (RSV, Flu A&B, Covid) Anterior Nasal Swab neg [HN]  1600 WBC: 4.3 No leukocytosis  [HN]  1601 Comprehensive metabolic panel(!) Unremarkable in the context of this patient's presentation  [HN]  1601 B Natriuretic Peptide(!): 149.2 Very mildly elevated [HN]  1602 Hemoglobin(!): 10.7 C/w prior baseline [HN]    Clinical Course User Index [HN] Loetta Rough, MD   Medical Decision Making Care assumed at 3:30 PM.  Patient is here with shortness of breath and wheezing.  Recently admitted for COPD and pneumonia.  Signout pending repeat CTA chest and admission.  6:46 PM Patient is still wheezing.  Patient still has increased work of breathing.  He was put on 2 L nasal cannula for increased work of breathing.  CTA showed increased lung mass consistent with progressive lung cancer.  Patient also has bronchitis.  At this point, patient will be admitted for COPD exacerbation  Problems Addressed: Chronic obstructive pulmonary disease with acute exacerbation (HCC): chronic illness or injury with exacerbation, progression, or side effects of treatment Malignant neoplasm of lung, unspecified laterality, unspecified part of lung (HCC): chronic illness or injury with exacerbation, progression, or side effects of treatment  Amount and/or Complexity of Data Reviewed Labs: ordered. Decision-making details documented in ED Course. Radiology: ordered and independent interpretation performed. Decision-making details documented in ED Course. ECG/medicine tests: ordered and independent interpretation performed. Decision-making details documented in ED  Course.  Risk Prescription drug management. Decision regarding hospitalization.          Charlynne Pander, MD 08/20/23 [phone]

## 2023-08-20 NOTE — Assessment & Plan Note (Addendum)
Followed at [facility], s/p radiation Enlarging lung masses c/w worsening lung CA on todays CT chest. Turns out that its already known that his lung CA is progressing and [facility] prior to his COPD exacerbation requiring admission. After discussing with patient, will defer further work up and management to [place] oncologists who are already seeing him / aware of progressive lung CA rather than getting our onc guys involved.

## 2023-08-20 NOTE — ED Notes (Signed)
Patient transported to CT 

## 2023-08-20 NOTE — ED Triage Notes (Signed)
Pt BIB [facility] from home due to shortness of breath that started at 0300 this morning.  Pt normally not on oxygen at home.  Pt reports he took 4 albuterol treatments with no relief. Pt has lung cancer that started on the right lung and now has traveled to the left lung.  [facility] did give 125 solumedrol en route.  18g left AC.  VS BP 130/80, HR 72, SpO2 89% RA;  pt currently on 2L oxygen nasal  cannula.

## 2023-08-20 NOTE — ED Notes (Signed)
Pt is refusing x-ray and any other interventions at this time until he receives pain medicine.  MD Silverio Lay notified and aware.

## 2023-08-20 NOTE — ED Provider Notes (Signed)
[place] EMERGENCY DEPARTMENT AT [facility] Provider Note   CSN: [id number] Arrival date & time: 08/20/23  1255     History  Chief Complaint  Patient presents with   Shortness of Breath    [name] is a 77 y.o. male with PMH as listed below who presents with SOB that started yesterday. No orthopnea. +Productive cough worse than normal. Endorses subjective fever/chills. No sick contacts. Very mild left-sided chest pain. SOB worse with exertion. No leg swelling. Has lung cancer, not on chemotherapy/radiation. No h/o DVT/PE. Took 4 albuterol nebulizers at home with no improvement. EMS gave solumedrol 125 mg but no nebulizers. Was hypoxic to 89% on RA with EMS who placed him on 2L [place].   Past Medical History:  Diagnosis Date   Anemia    Arthritis    Back pain    scoliosis   CAD (coronary artery disease) 03/24/2011   - S/p stent to RCA in 1999  - Cath in 2019 with patent stent and mod nonobstructive disease elsewhere  - Myoview 06/05/23: EF 61, normal perfusion, low risk     COPD (chronic obstructive pulmonary disease) (HCC)    Depression    Diverticulosis    GERD (gastroesophageal reflux disease)    GI bleed    History of colon polyps    History of hiatal hernia    Hyperkalemia    Hyperlipidemia    Hypertension    Lung cancer (HCC) 06/04/2023   Radiation Rx; [facility]    Scoliosis    Tobacco abuse        Home Medications Prior to Admission medications   Medication Sig Start Date End Date Taking? Authorizing Provider  aspirin EC 81 MG tablet Take 1 tablet (81 mg total) by mouth daily. 02/24/18   Robbie Lis M, PA-C  cyanocobalamin 500 MCG tablet Take 500 mcg by mouth 2 (two) times daily. VITAMIN B-12 08/18/14   [provider]  donepezil (ARICEPT) 10 MG tablet TAKE ONE-HALF TABLET BY MOUTH EVERY DAY FOR DEMENTIA 08/31/21   [provider]  escitalopram (LEXAPRO) 20 MG tablet Take 1 tablet (20 mg total) by mouth daily.  04/12/14   Kathleene Hazel, MD  feeding supplement (ENSURE ENLIVE / ENSURE PLUS) LIQD Take 237 mLs by mouth 3 (three) times daily between meals. 07/30/23   Enedina Finner, MD  folic acid (FOLVITE) 1 MG tablet Take 1 mg by mouth daily. 10/01/21   [provider]  gabapentin (NEURONTIN) 400 MG capsule Take 400 mg by mouth at bedtime.     [provider]  ipratropium-albuterol (DUONEB) 0.5-2.5 (3) MG/3ML SOLN Take 3 mLs by nebulization 2 (two) times daily. 07/30/23   Enedina Finner, MD  montelukast (SINGULAIR) 10 MG tablet TAKE 1 TABLET AT BEDTIME 04/28/20   Johnna Acosta, NP  morphine (MS CONTIN) 15 MG 12 hr tablet Take 15 mg by mouth 2 (two) times daily.    [provider]  Multiple Vitamin (MULTIVITAMIN WITH MINERALS) TABS tablet Take 1 tablet by mouth daily. 07/31/23   Enedina Finner, MD  nicotine (NICODERM CQ - DOSED IN MG/24 HOURS) 21 mg/24hr patch Place 1 patch (21 mg total) onto the skin daily as needed (nicotine craving). 07/30/23   Enedina Finner, MD  oxyCODONE-acetaminophen (PERCOCET) 5-325 MG per tablet Take 1 tablet by mouth every 4 (four) hours as needed for moderate pain. For pain    [provider]  potassium & sodium phosphates (PHOS-NAK) 280-160-250 MG PACK  Take 1 packet by mouth 4 (four) times daily -  with meals and at bedtime. 06/20/23   Marrion Coy, MD  rosuvastatin (CRESTOR) 40 MG tablet Take 1 tablet (40 mg total) by mouth daily. 06/04/23   Tereso Newcomer T, PA-C  Vitamin D, Ergocalciferol, (DRISDOL) 50000 UNITS CAPS capsule Take 50,000 Units by mouth every Wednesday.  12/15/14   [provider]      Allergies    Levofloxacin and Doxycycline    Review of Systems   Review of Systems A 10 point review of systems was performed and is negative unless otherwise reported in HPI.  Physical Exam Updated Vital Signs BP 139/69   Pulse 68   Temp 98 F (36.7 C) (Oral)   Resp 17   Ht 5\' 8"  (1.727 m)   Wt 56 kg   SpO2 96%   BMI 18.77 kg/m   Physical Exam General: chronically ill-appearing male, lying in bed.  HEENT: Sclera anicteric, dry mucous membranes, trachea midline.  Cardiology: RRR, no murmurs/rubs/gallops. Resp: Normal respiratory rate and effort. +Inspiratory/expiratory wheezing. No rhonchi, crackles.  Abd: Soft, non-tender, non-distended. No rebound tenderness or guarding.  GU: Deferred. MSK: No peripheral edema or signs of trauma.  Skin: warm, dry.  Back: No CVA tenderness Neuro: A&Ox4, CNs II-XII grossly intact. MAEs. Sensation grossly intact.  Psych: Normal mood and affect.   ED Results / Procedures / Treatments   Labs (all labs ordered are listed, but only abnormal results are displayed) Labs Reviewed  CBC WITH DIFFERENTIAL/PLATELET - Abnormal; Notable for the following components:      Result Value   RBC 3.43 (*)    Hemoglobin 10.7 (*)    HCT 33.7 (*)    Lymphs Abs 0.4 (*)    All other components within normal limits  COMPREHENSIVE METABOLIC PANEL - Abnormal; Notable for the following components:   Glucose, Bld 120 (*)    Albumin 2.9 (*)    All other components within normal limits  BRAIN NATRIURETIC PEPTIDE - Abnormal; Notable for the following components:   B Natriuretic Peptide 149.2 (*)    All other components within normal limits  I-STAT VENOUS BLOOD GAS, ED - Abnormal; Notable for the following components:   pCO2, Ven 42.3 (*)    pO2, Ven 128 (*)    Acid-Base Excess 3.0 (*)    HCT 31.0 (*)    Hemoglobin 10.5 (*)    All other components within normal limits  RESP PANEL BY RT-PCR (RSV, FLU A&B, COVID)  [id number]  TROPONIN I (HIGH SENSITIVITY)  TROPONIN I (HIGH SENSITIVITY)    EKG EKG Interpretation Date/Time:  Wednesday August 20 2023 13:03:57 EST Ventricular Rate:  76 PR Interval:  129 QRS Duration:  95 QT Interval:  428 QTC Calculation: 482 R Axis:   86  Text Interpretation: Unknown rhythm, irregular rate Borderline right axis deviation Borderline prolonged QT interval No  significant change since last tracing Confirmed by Richardean Canal [id number]) on 08/20/2023 3:29:47 PM  Radiology No results found.  Procedures Procedures    Medications Ordered in ED Medications  ipratropium-albuterol (DUONEB) 0.5-2.5 (3) MG/3ML nebulizer solution 3 mL (3 mLs Nebulization Not Given 08/20/23 1403)  magnesium sulfate IVPB 2 g 50 mL (2 g Intravenous New Bag/Given 08/20/23 1543)  ipratropium-albuterol (DUONEB) 0.5-2.5 (3) MG/3ML nebulizer solution 3 mL (3 mLs Nebulization Given 08/20/23 1543)  oxyCODONE-acetaminophen (PERCOCET/ROXICET) 5-325 MG per tablet 2 tablet (2 tablets Oral Given 08/20/23 1558)  HYDROmorphone (DILAUDID) injection 1 mg (1 mg Intravenous  Given 08/20/23 1558)    ED Course/ Medical Decision Making/ A&P                          Medical Decision Making Amount and/or Complexity of Data Reviewed Labs: ordered. Decision-making details documented in ED Course. Radiology: ordered.  Risk Prescription drug management.    This patient presents to the ED for concern of SOB, this involves an extensive number of treatment options, and is a complaint that carries with it a high risk of complications and morbidity.  I considered the following differential and admission for this acute, potentially life threatening condition.   MDM:    DDX for dyspnea includes but is not limited to:  W/ h/o COPD and wheezing on exam, greatest concern for COPD exacerbation. Also consider PNA, bronchitis, URI. With reported minimal improvement with nebulizers at home and cancer status, must consider PE and will get CT. Does have minimal CP; EKG w/o signs of ischemia and will get trops to r/o ACS. No arrhythmia on EKG/telemetry. No significant LEE and no orthopnea reported, no crackles on exam, lower c/f HF.   Clinical Course as of 08/20/23 1602  Wed Aug 20, 2023  1513 Troponin I (High Sensitivity): 8 neg [HN]  1513 Resp panel by RT-PCR (RSV, Flu A&B, Covid) Anterior Nasal Swab neg [HN]   1600 WBC: 4.3 No leukocytosis  [HN]  1601 Comprehensive metabolic panel(!) Unremarkable in the context of this patient's presentation  [HN]  1601 B Natriuretic Peptide(!): 149.2 Very mildly elevated [HN]  1602 Hemoglobin(!): 10.7 C/w prior baseline [HN]    Clinical Course User Index [HN] Loetta Rough, MD    Labs: I Ordered, and personally interpreted labs.  The pertinent results include:  those listed above  Imaging Studies ordered: I ordered imaging studies including CT PE I independently visualized and interpreted imaging. I agree with the radiologist interpretation  Additional history obtained from chart review.    Cardiac Monitoring: The patient was maintained on a cardiac monitor.  I personally viewed and interpreted the cardiac monitored which showed an underlying rhythm of: NSR  Social Determinants of Health: Lives independently  Disposition:  Patient is signed out to the oncoming ED physician Dr. Silverio Lay who is made aware of her history, presentation, exam, workup, and plan.    Co morbidities that complicate the patient evaluation  Past Medical History:  Diagnosis Date   Anemia    Arthritis    Back pain    scoliosis   CAD (coronary artery disease) 03/24/2011   - S/p stent to RCA in 1999  - Cath in 2019 with patent stent and mod nonobstructive disease elsewhere  - Myoview 06/05/23: EF 61, normal perfusion, low risk     COPD (chronic obstructive pulmonary disease) (HCC)    Depression    Diverticulosis    GERD (gastroesophageal reflux disease)    GI bleed    History of colon polyps    History of hiatal hernia    Hyperkalemia    Hyperlipidemia    Hypertension    Lung cancer (HCC) 06/04/2023   Radiation Rx; [facility]    Scoliosis    Tobacco abuse      Medicines Meds ordered this encounter  Medications   ipratropium-albuterol (DUONEB) 0.5-2.5 (3) MG/3ML nebulizer solution 3 mL   magnesium sulfate IVPB 2 g 50 mL   ipratropium-albuterol  (DUONEB) 0.5-2.5 (3) MG/3ML nebulizer solution 3 mL   oxyCODONE-acetaminophen (PERCOCET/ROXICET) 5-325  MG per tablet 2 tablet   HYDROmorphone (DILAUDID) injection 1 mg    I have reviewed the patients home medicines and have made adjustments as needed  Problem List / ED Course: Problem List Items Addressed This Visit   None               This note was created using dictation software, which may contain spelling or grammatical errors.    Loetta Rough, MD 08/20/23 [phone]

## 2023-08-21 ENCOUNTER — Encounter (HOSPITAL_COMMUNITY): Payer: Self-pay | Admitting: Internal Medicine

## 2023-08-21 DIAGNOSIS — Z1152 Encounter for screening for COVID-19: Secondary | ICD-10-CM | POA: Diagnosis not present

## 2023-08-21 DIAGNOSIS — I10 Essential (primary) hypertension: Secondary | ICD-10-CM | POA: Diagnosis present

## 2023-08-21 DIAGNOSIS — Z951 Presence of aortocoronary bypass graft: Secondary | ICD-10-CM | POA: Diagnosis not present

## 2023-08-21 DIAGNOSIS — R0902 Hypoxemia: Secondary | ICD-10-CM | POA: Diagnosis present

## 2023-08-21 DIAGNOSIS — J441 Chronic obstructive pulmonary disease with (acute) exacerbation: Secondary | ICD-10-CM | POA: Diagnosis present

## 2023-08-21 DIAGNOSIS — F17210 Nicotine dependence, cigarettes, uncomplicated: Secondary | ICD-10-CM | POA: Diagnosis present

## 2023-08-21 DIAGNOSIS — Z833 Family history of diabetes mellitus: Secondary | ICD-10-CM | POA: Diagnosis not present

## 2023-08-21 DIAGNOSIS — J209 Acute bronchitis, unspecified: Secondary | ICD-10-CM | POA: Diagnosis present

## 2023-08-21 DIAGNOSIS — I2510 Atherosclerotic heart disease of native coronary artery without angina pectoris: Secondary | ICD-10-CM | POA: Diagnosis present

## 2023-08-21 DIAGNOSIS — D649 Anemia, unspecified: Secondary | ICD-10-CM | POA: Diagnosis present

## 2023-08-21 DIAGNOSIS — Z923 Personal history of irradiation: Secondary | ICD-10-CM | POA: Diagnosis not present

## 2023-08-21 DIAGNOSIS — F1120 Opioid dependence, uncomplicated: Secondary | ICD-10-CM | POA: Diagnosis not present

## 2023-08-21 DIAGNOSIS — J440 Chronic obstructive pulmonary disease with acute lower respiratory infection: Secondary | ICD-10-CM | POA: Diagnosis present

## 2023-08-21 DIAGNOSIS — C3490 Malignant neoplasm of unspecified part of unspecified bronchus or lung: Secondary | ICD-10-CM | POA: Diagnosis present

## 2023-08-21 DIAGNOSIS — Z881 Allergy status to other antibiotic agents status: Secondary | ICD-10-CM | POA: Diagnosis not present

## 2023-08-21 DIAGNOSIS — Z79899 Other long term (current) drug therapy: Secondary | ICD-10-CM | POA: Diagnosis not present

## 2023-08-21 DIAGNOSIS — Z7982 Long term (current) use of aspirin: Secondary | ICD-10-CM | POA: Diagnosis not present

## 2023-08-21 DIAGNOSIS — Z681 Body mass index (BMI) 19 or less, adult: Secondary | ICD-10-CM | POA: Diagnosis not present

## 2023-08-21 DIAGNOSIS — Z79891 Long term (current) use of opiate analgesic: Secondary | ICD-10-CM | POA: Diagnosis not present

## 2023-08-21 DIAGNOSIS — Z820 Family history of epilepsy and other diseases of the nervous system: Secondary | ICD-10-CM | POA: Diagnosis not present

## 2023-08-21 DIAGNOSIS — F0390 Unspecified dementia without behavioral disturbance: Secondary | ICD-10-CM | POA: Diagnosis present

## 2023-08-21 DIAGNOSIS — Z860100 Personal history of colon polyps, unspecified: Secondary | ICD-10-CM | POA: Diagnosis not present

## 2023-08-21 DIAGNOSIS — M419 Scoliosis, unspecified: Secondary | ICD-10-CM | POA: Diagnosis present

## 2023-08-21 DIAGNOSIS — E785 Hyperlipidemia, unspecified: Secondary | ICD-10-CM | POA: Diagnosis present

## 2023-08-21 DIAGNOSIS — E43 Unspecified severe protein-calorie malnutrition: Secondary | ICD-10-CM | POA: Diagnosis present

## 2023-08-21 LAB — CBC
HCT: 32.6 % — ABNORMAL LOW (ref 39.0–52.0)
Hemoglobin: 10.9 g/dL — ABNORMAL LOW (ref 13.0–17.0)
MCH: 32.2 pg (ref 26.0–34.0)
MCHC: 33.4 g/dL (ref 30.0–36.0)
MCV: 96.4 fL (ref 80.0–100.0)
Platelets: 168 10*3/uL (ref 150–400)
RBC: 3.38 MIL/uL — ABNORMAL LOW (ref 4.22–5.81)
RDW: 15.1 % (ref 11.5–15.5)
WBC: 7.5 10*3/uL (ref 4.0–10.5)
nRBC: 0 % (ref 0.0–0.2)

## 2023-08-21 LAB — BASIC METABOLIC PANEL
Anion gap: 10 (ref 5–15)
BUN: 16 mg/dL (ref 8–23)
CO2: 25 mmol/L (ref 22–32)
Calcium: 9.2 mg/dL (ref 8.9–10.3)
Chloride: 102 mmol/L (ref 98–111)
Creatinine, Ser: 0.61 mg/dL (ref 0.61–1.24)
GFR, Estimated: >60 mL/min (ref >60)
Glucose, Bld: 115 mg/dL — ABNORMAL HIGH (ref 70–99)
Potassium: 3.7 mmol/L (ref 3.5–5.1)
Sodium: 137 mmol/L (ref 135–145)

## 2023-08-21 LAB — HIV ANTIBODY (ROUTINE TESTING W REFLEX): HIV Screen 4th Generation wRfx: NONREACTIVE

## 2023-08-21 MED ORDER — GUAIFENESIN ER 600 MG PO TB12
1200.0000 mg | ORAL_TABLET | Freq: Two times a day (BID) | ORAL | Status: DC
  Administered 2023-08-21 – 2023-08-22 (×2): 1200 mg via ORAL
  Filled 2023-08-21 (×2): qty 2

## 2023-08-21 MED ORDER — IPRATROPIUM-ALBUTEROL 0.5-2.5 (3) MG/3ML IN SOLN
3.0000 mL | Freq: Four times a day (QID) | RESPIRATORY_TRACT | Status: DC
  Administered 2023-08-21 – 2023-08-22 (×3): 3 mL via RESPIRATORY_TRACT
  Filled 2023-08-21 (×3): qty 3

## 2023-08-21 MED ORDER — BOOST / RESOURCE BREEZE PO LIQD CUSTOM: 1.0000 | ORAL | Status: DC

## 2023-08-21 MED ORDER — POLYETHYLENE GLYCOL 3350 17 G PO PACK
17.0000 g | PACK | Freq: Every day | ORAL | Status: DC
Start: 2023-08-21 — End: 2023-08-22
  Administered 2023-08-21 – 2023-08-22 (×2): 17 g via ORAL
  Filled 2023-08-21 (×2): qty 1

## 2023-08-21 NOTE — Progress Notes (Signed)
Initial Nutrition Assessment  DOCUMENTATION CODES:   Severe malnutrition in context of chronic illness  INTERVENTION:  Liberalize diet to regular; ordering with assistance Ensure Enlive po BID, each supplement provides 350 kcal and 20 grams of protein. Boost Breeze po once, each supplement provides 250 kcal and 9 grams of protein Magic cup TID with meals, each supplement provides 290 kcal and 9 grams of protein  NUTRITION DIAGNOSIS:   Severe Malnutrition related to chronic illness, cancer and cancer related treatments as evidenced by severe fat depletion, severe muscle depletion.  GOAL:   Patient will meet greater than or equal to 90% of their needs  MONITOR:   PO intake, Supplement acceptance, Labs, Weight trends  REASON FOR ASSESSMENT:   Consult Assessment of nutrition requirement/status  ASSESSMENT:   Pt admitted with SOB. PMH significant for lung cancer s/p radiation at [place], COPD and dementia. Recently admitted to [facility] for COPD exacerbation 10/14-10/16.   CT chest findings of increasing size lung mass c/w progressive lung cancer.   Pt eating breakfast upon RD visit. He is very thrilled with the hospital foods provided. He states that during prior admission to [facility], they were calling to take his meal orders and desires this during current admission. Order updated.   Pt states that after last discharge he was eating well up until about 2-3 days ago where he has been unable to consume any PO d/t difficulty breathing. Prior to this, he has been reliant on a majority of frozen dinners. He really enjoys the [employer]. He had been eating about 2 times per day. Breakfast had consisted of cereal. He has Ensure supplements at home but is tired of them so has not been drinking them. Discussed trying alternative nutrition supplements, pt is agreeable to this plan.   Average Meal Intake: No documented meal completions on file to review.   Nutritionally Relevant  Medications: Scheduled Meds:  aspirin EC  81 mg Oral Daily   azithromycin  500 mg Oral Daily   cyanocobalamin  500 mcg Oral BID   donepezil  5 mg Oral Daily   enoxaparin (LOVENOX) injection  40 mg Subcutaneous Q24H   escitalopram  20 mg Oral q AM   feeding supplement  1 Container Oral Q24H   feeding supplement  237 mL Oral BID BM   folic acid  1 mg Oral Daily   gabapentin  400 mg Oral BID   mometasone-formoterol  2 puff Inhalation BID   montelukast  10 mg Oral QHS   morphine  15 mg Oral BID   polyethylene glycol  17 g Oral Daily   predniSONE  40 mg Oral Q breakfast   rosuvastatin  40 mg Oral QPM   Continuous Infusions:  azithromycin     PRN Meds:.acetaminophen, albuterol, guaiFENesin, ibuprofen, oxyCODONE-acetaminophen  Admit weight: 56 kg (question stated versus actual) Current weight: none documented   Labs Reviewed  NUTRITION - FOCUSED PHYSICAL EXAM:  Flowsheet Row Most Recent Value  Orbital Region Severe depletion  Upper Arm Region Severe depletion  Thoracic and Lumbar Region Severe depletion  Buccal Region Severe depletion  Temple Region Severe depletion  Clavicle Bone Region Severe depletion  Clavicle and Acromion Bone Region Severe depletion  Scapular Bone Region Severe depletion  Dorsal Hand Severe depletion  Patellar Region Severe depletion  Anterior Thigh Region Severe depletion  Posterior Calf Region Severe depletion  Edema (RD Assessment) None  Hair Reviewed  Eyes Reviewed  Mouth Other (Comment)  [edentulous]  Skin Reviewed  Nails Reviewed       Diet Order:   Diet Order             Diet regular Room service appropriate? Yes with Assist; Fluid consistency: Thin  Diet effective now                   EDUCATION NEEDS:   Education needs have been addressed  Skin:  Skin Assessment: Reviewed RN Assessment  Last BM:  PTA  Height:   Ht Readings from Last 1 Encounters:  08/20/23 5\' 8"  (1.727 m)    Weight:   Wt Readings from Last 1  Encounters:  08/20/23 56 kg   BMI:  Body mass index is 18.77 kg/m.  Estimated Nutritional Needs:   Kcal:  1900-2100  Protein:  95-110g  Fluid:  >/=1.9L  Drusilla Kanner, RDN, LDN Clinical Nutrition

## 2023-08-21 NOTE — Plan of Care (Signed)
Problem: Education: Goal: Knowledge of General Education information will improve Description: Including pain rating scale, medication(s)/side effects and non-pharmacologic comfort measures Outcome: Progressing Pt was admitted into the hospital for shortness of breath and very mild left-sided chest pain. SOB worsening with exertion.  He has an admitting diagnosis of COPD exacerbations.    Problem: Clinical Measurements: Goal: Will remain free from infection Outcome: Progressing S/Sx of infection monitored and assessed q-shift.  Pt has remained afebrile thus far.  He is on PO abx per MD's orders.    Problem: Clinical Measurements: Goal: Respiratory complications will improve Outcome: Progressing Respiratory status monitored and assessed q-shift.  Pt is on room air with PO2 at 92-95% and respiration rate of 17-20 breaths per minute.  Pt has not endorsed c/o SOB or DOE.    Problem: Clinical Measurements: Goal: Cardiovascular complication will be avoided Outcome: Progressing Pt's VS WNL thus far.     Problem: Activity: Goal: Risk for activity intolerance will decrease Outcome: Progressing Pt is independent of all his ADLs.  He can get up OOB with the assistance of RN staff.  PT came to assess pt this shift and got him OOB to walk the hallways.   See their note.    Problem: Nutrition: Goal: Adequate nutrition will be maintained Outcome: Progressing Pt is on a regular diet per MD's orders and can tolerate it w/o s/sx of abdominal pain/ distention or n/v.    Problem: Safety: Goal: Ability to remain free from injury will improve Outcome: Progressing Pt has remained free from falls thus far.  Instructed pt to utilize RN call light for assistance.  Hourly rounds performed.  Bed alarm implemented to  keep safe from falls.  Settings activated to third most sensitive mode.  Bed in lowest position, locked with two upper side rails engaged.  Belongings and call light within reach.    Problem:  Skin Integrity: Goal: Risk for impaired skin integrity will decrease Outcome: Progressing Skin integrity monitored and assessed q-shift.  Instructed pt to turn q2 hours to prevent further skin impairment.  Tubes and drains assessed for device related pressures sores.  Pt is continent of both bowel and bladder.  Dressing changes performed per MD's orders.    Problem: Pain Management: Goal: General experience of comfort will improve Outcome: Progressing Pt has endorsed c/o 5-10/10 mid upper/mid/ lower back pain and headache describing it as a constant dull, ache.  Reiterated pain scale so he could adequately rate his pain.  Pt stated his pain goal this admission would be 0/10.  Discussed nonpharmacological methods to help reduce s/sx of pain.  Interventions given per pt's request and MD's orders.

## 2023-08-21 NOTE — Progress Notes (Signed)
[facility]  PROGRESS NOTE  ZAKHAI MEISINGER WGN:[id number] DOB: 24-Dec-1945 DOA: 08/20/2023 [facility]   Brief HPI:   77 y.o. male with medical history significant of Lung CA s/p radiation at [place], COPD.   Pt with admit for COPD exacerbation to [facility] around middle of last month.   Pt in to ED with wheezing, increased WOB.  Put on 2L via [place].   CTA showed increasing size lung mass c/w progressive lung CA.      Assessment/Plan:   COPD exacerbation -Continue prednisone 40 mg daily -Add flutter valve, Mucinex -Start DuoNebs every 6 hours  Lung cancer (HCC) Followed at [facility], s/p radiation Enlarging lung masses c/w worsening lung CA on todays CT chest. Turns out that its already known that his lung CA is progressing and [facility] prior to his COPD exacerbation requiring admission. will defer further work up and management to [place] oncologists who are already seeing him / aware of progressive lung CA rather than getting oncology getting involved in our hospital   Narcotic dependency, continuous (HCC) Reordered home opiates    Anemia HGB 10.6, c/w prior baseline. Medications     aspirin EC  81 mg Oral Daily   azithromycin  500 mg Oral Daily   cyanocobalamin  500 mcg Oral BID   donepezil  5 mg Oral Daily   enoxaparin (LOVENOX) injection  40 mg Subcutaneous Q24H   escitalopram  20 mg Oral q AM   feeding supplement  1 Container Oral Q24H   feeding supplement  237 mL Oral BID BM   folic acid  1 mg Oral Daily   gabapentin  400 mg Oral BID   mometasone-formoterol  2 puff Inhalation BID   montelukast  10 mg Oral QHS   morphine  15 mg Oral BID   polyethylene glycol  17 g Oral Daily   predniSONE  40 mg Oral Q breakfast   rosuvastatin  40 mg Oral QPM     Data Reviewed:   CBG:  No results for input(s): "GLUCAP" in the last 168 hours.  SpO2: 92 % O2 Flow Rate (L/min): 2 L/min    Vitals:    08/21/23 0547 08/21/23 0823 08/21/23 0838 08/21/23 1704  BP: (!) 137/91 130/67  (!) 135/56  Pulse: 72 60 64 70  Resp: 20  17   Temp:  98.6 F (37 C)  98.6 F (37 C)  TempSrc:      SpO2: 94% 95% 93% 92%  Weight:      Height:          Data Reviewed:  Basic Metabolic Panel: Recent Labs  Lab 08/20/23 1316 08/20/23 1421 08/21/23 0538  NA 139 140 137  K 3.9 3.9 3.7  CL 103  --  102  CO2 26  --  25  GLUCOSE 120*  --  115*  BUN 18  --  16  CREATININE 0.79  --  0.61  CALCIUM 9.1  --  9.2    CBC: Recent Labs  Lab 08/20/23 1316 08/20/23 1421 08/21/23 0538  WBC 4.3  --  7.5  NEUTROABS 3.6  --   --   HGB 10.7* 10.5* 10.9*  HCT 33.7* 31.0* 32.6*  MCV 98.3  --  96.4  PLT 169  --  168    LFT Recent Labs  Lab 08/20/23 1316  AST 20  ALT 9  ALKPHOS 98  BILITOT 0.6  PROT 6.5  ALBUMIN 2.9*     Antibiotics: Anti-infectives (From admission, onward)    Start     Dose/Rate Route Frequency Ordered Stop   08/21/23 1930  azithromycin (ZITHROMAX) tablet 500 mg       Placed in "Followed by" Linked Group   500 mg Oral Daily 08/20/23 1928 08/25/23 0959   08/20/23 1930  azithromycin (ZITHROMAX) 500 mg in dextrose 5 % 250 mL IVPB       Placed in "Followed by" Linked Group   500 mg 250 mL/hr over 60 Minutes Intravenous Every 24 hours 08/20/23 1928 08/21/23 1929        DVT prophylaxis: Lovenox  Code Status: Full code  Family Communication: No family at bedside   CONSULTS none   Subjective   Still coughing up phlegm.  Breathing has improved.   Objective    Physical Examination:   General-appears in no acute distress Heart-S1-S2, regular, no murmur auscultated Lungs-bilateral rhonchi auscultated Abdomen-soft, nontender, no organomegaly Extremities-no edema in the lower extremities Neuro-alert, oriented x3, no focal deficit noted  Status is: Inpatient:             Adler Chartrand S [place]   [facility] If 7PM-7AM, please contact night-coverage  at [email or web address], Office  [phone]   08/21/2023, 5:44 PM  LOS: 0 days

## 2023-08-21 NOTE — Evaluation (Signed)
Physical Therapy Evaluation Patient Details Name: [name] MRN: [id number] DOB: 1946/06/05 Today's Date: 08/21/2023  History of Present Illness  The pt is a 77 y.o. male presenting 11/6 with SOB, SpO2 89% on RA. Dx with COPD exacerbation. PMH includes: lung cancer, dementia, CAD, hypertension, opioid pain dependence, depression, hyperlipidemia, COPD, history of diverticulosis, tubular adenoma of the colon, and history of lower GI bleed.   Clinical Impression  Pt in bed upon arrival of PT, agreeable to evaluation at this time. Prior to admission the pt was independent with mobility, but does reports limited endurance at times. He reports he is managing well, active and often walking around outside on his property. The pt was able to complete all bed mobility and transfers without assistance and demos good mobility in the room without instability. With longer hallway ambulation, he did take standing rest breaks due to coughing or SOB, but SpO2 to low of 89% and recovered quickly to mid 90s with PLB and standing rest. Pt is safe to return home when medically stable, SpO2 mostly 95% on RA with gait and rest.         If plan is discharge home, recommend the following: A little help with walking and/or transfers;A little help with bathing/dressing/bathroom   Can travel by private vehicle        Equipment Recommendations None recommended by PT  Recommendations for Other Services       Functional Status Assessment Patient has had a recent decline in their functional status and demonstrates the ability to make significant improvements in function in a reasonable and predictable amount of time.     Precautions / Restrictions Precautions Precautions: Fall Precaution Comments: watch O2, on RA this session Restrictions Weight Bearing Restrictions: No      Mobility  Bed Mobility Overal bed mobility: Independent                  Transfers Overall transfer level:  Independent Equipment used: None Transfers: Sit to/from Stand, Bed to chair/wheelchair/BSC             General transfer comment: good balance, able to transfer without LOB or SOB. supervision for long distances.    Ambulation/Gait Ambulation/Gait assistance: Supervision Gait Distance (Feet): 500 Feet Assistive device: None Gait Pattern/deviations: WFL(Within Functional Limits)   Gait velocity interpretation: 1.31 - 2.62 ft/sec, indicative of limited community ambulator   General Gait Details: kyphotic posture with at times staggering steps or pt stopping briefly for standing rest. SpO2 to low of 89% but recovered to mid 90s with PLB     Balance Overall balance assessment: Independent                                           Pertinent Vitals/Pain Pain Assessment Pain Assessment: No/denies pain    Home Living Family/patient expects to be discharged to:: Private residence Living Arrangements: Alone Available Help at Discharge: Family;Available PRN/intermittently Type of Home: House Home Access: Stairs to enter Entrance Stairs-Rails: None Entrance Stairs-Number of Steps: 2   Home Layout: Two level;Able to live on main level with bedroom/bathroom Home Equipment: Rolling Walker (2 wheels);Cane - single point Additional Comments: Pt lives alone, has daughter nearby who can assist occasionally.    Prior Function Prior Level of Function : Independent/Modified Independent;Driving             Mobility Comments: no use  of DME, very active ADLs Comments: independent     Extremity/Trunk Assessment   Upper Extremity Assessment Upper Extremity Assessment: Defer to OT evaluation LUE Deficits / Details: L shoudler pain at EROM, stiffness, ROM to 90 degrees. LUE Sensation: WNL    Lower Extremity Assessment Lower Extremity Assessment: Overall WFL for tasks assessed    Cervical / Trunk Assessment Cervical / Trunk Assessment: Kyphotic  Communication    Communication Communication: No apparent difficulties  Cognition Arousal: Alert Behavior During Therapy: WFL for tasks assessed/performed Overall Cognitive Status: Within Functional Limits for tasks assessed                                          General Comments General comments (skin integrity, edema, etc.): SpO2 low of 89% on RA with walking, improved to mid 90s quickly    Exercises     Assessment/Plan    PT Assessment Patient needs continued PT services  PT Problem List Decreased activity tolerance;Decreased balance;Decreased mobility;Decreased coordination;Decreased knowledge of use of DME;Decreased safety awareness;Decreased knowledge of precautions;Cardiopulmonary status limiting activity       PT Treatment Interventions DME instruction;Gait training;Stair training;Functional mobility training;Therapeutic activities;Therapeutic exercise;Balance training;Patient/family education    PT Goals (Current goals can be found in the Care Plan section)  Acute Rehab PT Goals Patient Stated Goal: to go home PT Goal Formulation: With patient Time For Goal Achievement: 09/04/23 Potential to Achieve Goals: Good    Frequency Min 1X/week        AM-PAC PT "6 Clicks" Mobility  Outcome Measure Help needed turning from your back to your side while in a flat bed without using bedrails?: None Help needed moving from lying on your back to sitting on the side of a flat bed without using bedrails?: None Help needed moving to and from a bed to a chair (including a wheelchair)?: None Help needed standing up from a chair using your arms (e.g., wheelchair or bedside chair)?: None Help needed to walk in hospital room?: A Little Help needed climbing 3-5 steps with a railing? : A Little 6 Click Score: 22    End of Session Equipment Utilized During Treatment: Gait belt Activity Tolerance: Patient tolerated treatment well Patient left: in bed;with call bell/phone within  reach Nurse Communication: Mobility status PT Visit Diagnosis: Muscle weakness (generalized) (M62.81);Difficulty in walking, not elsewhere classified (R26.2)    Time: 4098-1191 PT Time Calculation (min) (ACUTE ONLY): 17 min   Charges:   PT Evaluation $PT Eval Low Complexity: 1 Low   PT General Charges $$ ACUTE PT VISIT: 1 Visit         Vickki Muff, PT, DPT   Acute Rehabilitation Department Office [phone] Secure Chat Communication Preferred  Ronnie Derby 08/21/2023, 3:42 PM

## 2023-08-21 NOTE — Care Management Obs Status (Signed)
MEDICARE OBSERVATION STATUS NOTIFICATION   Patient Details  Name: [name] MRN: [id number] Date of Birth: [birth year]   Medicare Observation Status Notification Given:  Yes    Harriet Masson, RN 08/21/2023, 9:55 AM

## 2023-08-21 NOTE — Evaluation (Signed)
Occupational Therapy Evaluation Patient Details Name: [name] MRN: [id number] DOB: 01/14/1946 Today's Date: 08/21/2023   History of Present Illness is a 77 y.o. male with admit for COPD exacerbation. history of dementia, CAD, hypertension, opioid pain dependence, depression, hyperlipidemia, COPD, history of lung mass, history of diverticulosis, tubular adenoma of the colon, history of lower GI bleed.   Clinical Impression   Pt feeling good, no pain, c/o SOB with prolonged OOB activities. Pt lives alone, 2 steps to enter home, daughter lives 10 minutes away and can assist at times if needed.  PLOF independent, driving. Pt currently close to baseline, able to complete ADLs and transfer independently short distances. Able to walk down the hall to nurses station and back but required a 3-5 minute rest break, wheezing, able to recover in a few minutes. Pt overall doing well, significantly decreased activity tolerance will limit Pt with IADLs, instructed on safety awareness, use of Rollator in home setting ,frequent breaks to ensure no exacerbation of symptoms. No OT follow up necessary, Pt has no further acute OT needs. Pt would benefit from a tub bench to allow for safety when showering as Pt states he slips sometimes when getting into shower.       If plan is discharge home, recommend the following: Assist for transportation;Assistance with cooking/housework    Functional Status Assessment  Patient has had a recent decline in their functional status and demonstrates the ability to make significant improvements in function in a reasonable and predictable amount of time.  Equipment Recommendations  Tub/shower bench    Recommendations for Other Services       Precautions / Restrictions Precautions Precautions: Fall Restrictions Weight Bearing Restrictions: No      Mobility Bed Mobility Overal bed mobility: Independent                  Transfers Overall transfer level:  Independent Equipment used: None Transfers: Sit to/from Stand, Bed to chair/wheelchair/BSC             General transfer comment: good balance, able to transfer without LOB or SOB. supervision for long distances.      Balance Overall balance assessment: Independent                                         ADL either performed or assessed with clinical judgement   ADL Overall ADL's : At baseline;Modified independent                                       General ADL Comments: ind with ADLs, supervision for long distances due to significant SOB and wheezing/fatigue.     Vision Baseline Vision/History: 1 Wears glasses Ability to See in Adequate Light: 1 Impaired Patient Visual Report: No change from baseline       Perception         Praxis         Pertinent Vitals/Pain Pain Assessment Pain Assessment: No/denies pain     Extremity/Trunk Assessment Upper Extremity Assessment Upper Extremity Assessment: LUE deficits/detail LUE Deficits / Details: L shoudler pain at EROM, stiffness, ROM to 90 degrees. LUE: Shoulder pain with ROM LUE Sensation: WNL           Communication Communication Communication: No apparent difficulties   Cognition Arousal: Alert Behavior  During Therapy: WFL for tasks assessed/performed Overall Cognitive Status: Within Functional Limits for tasks assessed                                       General Comments       Exercises     Shoulder Instructions      Home Living Family/patient expects to be discharged to:: Private residence Living Arrangements: Alone Available Help at Discharge: Family;Available PRN/intermittently Type of Home: House Home Access: Stairs to enter [employer] of Steps: 2 Entrance Stairs-Rails: None Home Layout: Two level;Able to live on main level with bedroom/bathroom     Bathroom Shower/Tub: Chief Strategy Officer: Standard      Home Equipment: Agricultural consultant (2 wheels);Cane - single point   Additional Comments: Pt lives alone, has daughter nearby who can assist occasionally.      Prior Functioning/Environment Prior Level of Function : Independent/Modified Independent;Driving                        OT Problem List: Decreased activity tolerance      OT Treatment/Interventions: Self-care/ADL training;Energy conservation;Therapeutic activities    OT Goals(Current goals can be found in the care plan section) Acute Rehab OT Goals Patient Stated Goal: to return home OT Goal Formulation: With patient Time For Goal Achievement: 09/04/23 Potential to Achieve Goals: Good  OT Frequency: Min 1X/week    Co-evaluation              AM-PAC OT "6 Clicks" Daily Activity     Outcome Measure Help from another person eating meals?: None Help from another person taking care of personal grooming?: None Help from another person toileting, which includes using toliet, bedpan, or urinal?: None Help from another person bathing (including washing, rinsing, drying)?: None Help from another person to put on and taking off regular upper body clothing?: None Help from another person to put on and taking off regular lower body clothing?: None 6 Click Score: 24   End of Session Equipment Utilized During Treatment: Gait belt Nurse Communication: Mobility status  Activity Tolerance: Patient tolerated treatment well Patient left: in bed;with call bell/phone within reach;with bed alarm set  OT Visit Diagnosis: Other (comment);Muscle weakness (generalized) (M62.81) (decreased activity tolerance)                Time: 1610-9604 OT Time Calculation (min): 35 min Charges:  OT General Charges $OT Visit: 1 Visit OT Evaluation $OT Eval Low Complexity: 1 Low OT Treatments $Self Care/Home Management : 8-22 mins  [place], OTR/L   Alexis Goodell 08/21/2023, 10:59 AM

## 2023-08-22 ENCOUNTER — Other Ambulatory Visit (HOSPITAL_COMMUNITY): Payer: Self-pay

## 2023-08-22 DIAGNOSIS — F0390 Unspecified dementia without behavioral disturbance: Secondary | ICD-10-CM | POA: Diagnosis not present

## 2023-08-22 DIAGNOSIS — J441 Chronic obstructive pulmonary disease with (acute) exacerbation: Secondary | ICD-10-CM | POA: Diagnosis not present

## 2023-08-22 DIAGNOSIS — C3490 Malignant neoplasm of unspecified part of unspecified bronchus or lung: Secondary | ICD-10-CM | POA: Diagnosis not present

## 2023-08-22 DIAGNOSIS — F1120 Opioid dependence, uncomplicated: Secondary | ICD-10-CM | POA: Diagnosis not present

## 2023-08-22 MED ORDER — GUAIFENESIN ER 600 MG PO TB12
600.0000 mg | ORAL_TABLET | Freq: Two times a day (BID) | ORAL | 0 refills | Status: AC
  Filled 2023-08-22: qty 10, 5d supply, fill #0

## 2023-08-22 MED ORDER — AZITHROMYCIN 500 MG PO TABS
500.0000 mg | ORAL_TABLET | Freq: Every day | ORAL | 0 refills | Status: DC
  Filled 2023-08-22: qty 2, 2d supply, fill #0

## 2023-08-22 NOTE — Discharge Summary (Signed)
Physician Discharge Summary   Patient: [name] MRN: [id number] DOB: 1946-08-03  Admit date:     08/20/2023  Discharge date: 08/22/23  Discharge Physician: Meredeth Ide   PCP: [facility]   Recommendations at discharge:   Follow-up PCP in 1 week Follow-up oncology at [facility]  Discharge Diagnoses: Principal Problem:   COPD exacerbation (HCC) Active Problems:   Lung cancer (HCC)   Dementia without behavioral disturbance (HCC)   Narcotic dependency, continuous (HCC)   Anemia   Acute bronchitis  Resolved Problems:   * No resolved hospital problems. *  Hospital Course: 77 y.o. male with medical history significant of Lung CA s/p radiation at [place], COPD.   Pt with admit for COPD exacerbation to [facility] around middle of last month.   Pt in to ED with wheezing, increased WOB.  Put on 2L via [place].   CTA showed increasing size lung mass c/w progressive lung CA.  Assessment and Plan:  COPD exacerbation -Significantly improved, back to baseline -No longer requiring oxygen -Continue Zithromax 500 mg p.o. daily for 2 more days -Continue prednisone 10 mg daily -Continue Mucinex 600 mg p.o. twice daily for 5 more days    Lung cancer (HCC) Followed at [facility], s/p radiation Enlarging lung masses c/w worsening lung CA on todays CT chest. Turns out that its already known that his lung CA is progressing and [facility] prior to his COPD exacerbation requiring admission. will defer further work up and management to [place] oncologists who are already seeing him / aware of progressive lung CA rather than getting oncology getting involved in our hospital     Narcotic dependency, continuous (HCC) -Continue home medications   Anemia HGB 10.6, c/w prior baseline. Medications        Consultants:  Procedures performed:  Disposition: Home Diet recommendation:  Discharge Diet Orders (From admission, onward)      Start     Ordered   08/22/23 0000  Diet - low sodium heart healthy        08/22/23 1035           Regular diet DISCHARGE MEDICATION: Allergies as of 08/22/2023       Reactions   Levofloxacin Itching, Rash   Doxycycline Nausea And Vomiting        Medication List     TAKE these medications    acetaminophen 500 MG tablet Commonly known as: TYLENOL Take 1,000 mg by mouth every 6 (six) hours as needed for mild pain (pain score 1-3) or moderate pain (pain score 4-6).   aspirin EC 81 MG tablet Take 1 tablet (81 mg total) by mouth daily.   azithromycin 500 MG tablet Commonly known as: ZITHROMAX Take 1 tablet (500 mg total) by mouth daily. Start taking on: August 23, 2023   cyanocobalamin 500 MCG tablet Commonly known as: VITAMIN B12 Take 500 mcg by mouth 2 (two) times daily. VITAMIN B-12   donepezil 10 MG tablet Commonly known as: ARICEPT Take 5 mg by mouth daily.   escitalopram 20 MG tablet Commonly known as: LEXAPRO Take 1 tablet (20 mg total) by mouth daily. What changed: when to take this   feeding supplement Liqd Take 237 mLs by mouth 3 (three) times daily between meals. What changed: when to take this   folic acid 1 MG tablet Commonly known as: FOLVITE Take 1 mg by mouth daily.   gabapentin 400 MG  capsule Commonly known as: NEURONTIN Take 400 mg by mouth 2 (two) times daily.   guaiFENesin 600 MG 12 hr tablet Commonly known as: MUCINEX Take 1 tablet (600 mg total) by mouth 2 (two) times daily for 5 days. What changed:  when to take this reasons to take this   ibuprofen 800 MG tablet Commonly known as: ADVIL Take 800 mg by mouth every 6 (six) hours as needed for mild pain (pain score 1-3), moderate pain (pain score 4-6) or headache. Takes with Oxy if needed   ipratropium-albuterol 0.5-2.5 (3) MG/3ML Soln Commonly known as: DUONEB Take 3 mLs by nebulization 2 (two) times daily.   montelukast 10 MG tablet Commonly known as: SINGULAIR TAKE 1  TABLET AT BEDTIME   morphine 15 MG 12 hr tablet Commonly known as: MS CONTIN Take 15 mg by mouth 2 (two) times daily.   oxyCODONE-acetaminophen 5-325 MG tablet Commonly known as: PERCOCET/ROXICET Take 1 tablet by mouth every 4 (four) hours as needed for moderate pain. For pain   predniSONE 10 MG tablet Commonly known as: DELTASONE Take 10 mg by mouth daily.   rosuvastatin 40 MG tablet Commonly known as: CRESTOR Take 1 tablet (40 mg total) by mouth daily. What changed: when to take this   Vitamin D (Ergocalciferol) 1.25 MG (50000 UNIT) Caps capsule Commonly known as: DRISDOL Take 50,000 Units by mouth every Wednesday.        Discharge Exam: Filed Weights   08/20/23 1304  Weight: 56 kg   General-appears in no acute distress Heart-S1-S2, regular, no murmur auscultated Lungs-clear to auscultation bilaterally, no wheezing or crackles auscultated Abdomen-soft, nontender, no organomegaly Extremities-no edema in the lower extremities Neuro-alert, oriented x3, no focal deficit noted  Condition at discharge: good  The results of significant diagnostics from this hospitalization (including imaging, microbiology, ancillary and laboratory) are listed below for reference.   Imaging Studies: DG Chest Port 1 View  Result Date: 08/20/2023 CLINICAL DATA:  Short of breath EXAM: PORTABLE CHEST 1 VIEW COMPARISON:  07/28/2023 FINDINGS: Single frontal view of the chest demonstrates a stable cardiac silhouette. Masslike consolidation within the left lower lobe compatible with known history of malignancy. Background emphysema. No acute airspace disease, effusion, or pneumothorax. IMPRESSION: 1. Continued left lower lobe masslike consolidation compatible with known history of malignancy. Please refer to concurrently performed CT chest exam for findings related to progressive bilateral lung cancer. 2. Emphysema.  No evidence of acute airspace disease. Electronically Signed   By: Sharlet Salina  M.D.   On: 08/20/2023 18:17   CT Angio Chest PE W and/or Wo Contrast  Result Date: 08/20/2023 CLINICAL DATA:  Short of breath since 3 a.m., history of lung cancer EXAM: CT ANGIOGRAPHY CHEST WITH CONTRAST TECHNIQUE: Multidetector CT imaging of the chest was performed using the standard protocol during bolus administration of intravenous contrast. Multiplanar CT image reconstructions and MIPs were obtained to evaluate the vascular anatomy. RADIATION DOSE REDUCTION: This exam was performed according to the departmental dose-optimization program which includes automated exposure control, adjustment of the mA and/or kV according to patient size and/or use of iterative reconstruction technique. CONTRAST:  75mL OMNIPAQUE IOHEXOL 350 MG/ML SOLN COMPARISON:  07/28/2023, 01/02/2023 FINDINGS: Cardiovascular: This is a technically adequate evaluation of the pulmonary vasculature. No filling defects or pulmonary emboli. The heart is unremarkable without significant pericardial fluid. No evidence of thoracic aortic aneurysm or dissection. Stable atherosclerosis of the aorta and coronary vasculature. Mediastinum/Nodes: Mediastinal adenopathy again identified. Index lymph nodes in the AP window  measuring 2.1 cm is and in the subcarinal region measuring 1.6 cm are unchanged by my measurement. No new adenopathy. Thyroid, trachea, and esophagus are unremarkable. Lungs/Pleura: A spiculated mass has developed in the right upper lobe abutting the mediastinum, measuring 2.4 x 2.0 cm reference image 58/6. Increasing spiculated peripheral masslike consolidation within the left lower lobe, now measuring 3.0 x 4.4 cm reference image 116/6. Findings are consistent with progressive bilateral lung cancer. There is severe background emphysema again noted. There is no evidence of acute airspace disease. Trace partially loculated left pleural effusion is noted within the medial costophrenic angle. No pneumothorax. Bilateral bronchial wall  thickening, most pronounced within the left lower lobe. Upper Abdomen: No acute abnormality. Musculoskeletal: No acute displaced fracture. Postsurgical changes are seen within the lumbar spine. Reconstructed images demonstrate no additional findings. Review of the MIP images confirms the above findings. IMPRESSION: 1. No evidence of pulmonary embolus. 2. Enlarging left lower lobe spiculated mass, with a new right upper lobe paramediastinal spiculated nodule, consistent with progressive lung cancer. 3. Stable mediastinal adenopathy, concerning for nodal metastases. 4. Bilateral bronchial wall thickening, most pronounced within the left lower lobe, consistent with bronchitis. 5. Trace partially loculated left pleural effusion. 6. Aortic Atherosclerosis (ICD10-I70.0) and Emphysema (ICD10-J43.9). Electronically Signed   By: Sharlet Salina M.D.   On: 08/20/2023 18:16   MR BRAIN W WO CONTRAST  Result Date: 07/29/2023 CLINICAL DATA:  Headache, new onset. History of lung cancer. Weakness. Dementia. EXAM: MRI HEAD WITHOUT AND WITH CONTRAST TECHNIQUE: Multiplanar, multiecho pulse sequences of the brain and surrounding structures were obtained without and with intravenous contrast. CONTRAST:  5mL GADAVIST GADOBUTROL 1 MMOL/ML IV SOLN COMPARISON:  Head CT yesterday.  MRI 03/22/2015. FINDINGS: Brain: Diffusion imaging does not show any acute or subacute infarction or other cause of restricted diffusion. Minimal small vessel change affects the pons. No focal cerebellar finding. Cerebral hemispheres show age related volume loss without subjective lobar predominance. There are old small vessel infarctions within the hemispheric white matter, mild in degree. No mass lesion, hemorrhage, hydrocephalus or extra-axial collection. After contrast administration, no abnormal enhancement occurs. Vascular: Major vessels at the base of the brain show flow. Skull and upper cervical spine: Negative Sinuses/Orbits: Clear/Matthew Other: None  IMPRESSION: 1. No acute finding by MRI. No evidence of metastatic disease. 2. Age related volume loss. Mild chronic small-vessel ischemic changes of the pons and cerebral hemispheric white matter. Electronically Signed   By: Paulina Fusi M.D.   On: 07/29/2023 17:24   CT HEAD WO CONTRAST ( )  Result Date: 07/29/2023 CLINICAL DATA:  Headache EXAM: CT HEAD WITHOUT CONTRAST TECHNIQUE: Contiguous axial images were obtained from the base of the skull through the vertex without intravenous contrast. RADIATION DOSE REDUCTION: This exam was performed according to the departmental dose-optimization program which includes automated exposure control, adjustment of the mA and/or kV according to patient size and/or use of iterative reconstruction technique. COMPARISON:  06/17/2023 FINDINGS: Brain: No evidence of acute infarction, hemorrhage, mass, mass effect, or midline shift. No hydrocephalus or extra-axial fluid collection. Vascular: No hyperdense vessel. Skull: Negative for fracture or focal lesion. Sinuses/Orbits: No acute finding. Status post bilateral lens replacements. Other: The mastoid air cells are well aerated. IMPRESSION: No acute intracranial process. Electronically Signed   By: Wiliam Ke M.D.   On: 07/29/2023 01:07   DG Chest 2 View  Result Date: 07/28/2023 CLINICAL DATA:  Weakness and fatigue. EXAM: CHEST - 2 VIEW COMPARISON:  Chest radiograph dated 06/17/2023. FINDINGS: Faint  diffuse interstitial coarsening and nodularity, improved since the prior radiograph. Left lung base density concerning for pneumonia. Clinical correlation and follow-up to resolution recommended. Trace left pleural effusion suspected. No pneumothorax. Stable mild cardiomegaly. Atherosclerotic calcification of the aorta. No acute osseous pathology. IMPRESSION: Left lung base density concerning for pneumonia. Clinical correlation and follow-up to resolution recommended. Electronically Signed   By: Elgie Collard M.D.   On:  07/28/2023 18:28    Microbiology: Results for orders placed or performed during the hospital encounter of 08/20/23  Resp panel by RT-PCR (RSV, Flu A&B, Covid) Anterior Nasal Swab     Status: None   Collection Time: 08/20/23  1:17 PM   Specimen: Anterior Nasal Swab  Result Value Ref Range Status   SARS Coronavirus 2 by RT PCR NEGATIVE NEGATIVE Final   Influenza A by PCR NEGATIVE NEGATIVE Final   Influenza B by PCR NEGATIVE NEGATIVE Final    Comment: (NOTE) The Xpert Xpress SARS-CoV-2/FLU/RSV plus assay is intended as an aid in the diagnosis of influenza from Nasopharyngeal swab specimens and should not be used as a sole basis for treatment. Nasal washings and aspirates are unacceptable for Xpert Xpress SARS-CoV-2/FLU/RSV testing.  Fact Sheet for Patients: BloggerCourse.com  Fact Sheet for Healthcare Providers: SeriousBroker.it  This test is not yet approved or cleared by the [place] FDA and has been authorized for detection and/or diagnosis of SARS-CoV-2 by FDA under an Emergency Use Authorization (EUA). This EUA will remain in effect (meaning this test can be used) for the duration of the COVID-19 declaration under Section 564(b)(1) of the Act, 21 U.S.C. section 360bbb-3(b)(1), unless the authorization is terminated or revoked.     Resp Syncytial Virus by PCR NEGATIVE NEGATIVE Final    Comment: (NOTE) Fact Sheet for Patients: BloggerCourse.com  Fact Sheet for Healthcare Providers: SeriousBroker.it  This test is not yet approved or cleared by the [place] FDA and has been authorized for detection and/or diagnosis of SARS-CoV-2 by FDA under an Emergency Use Authorization (EUA). This EUA will remain in effect (meaning this test can be used) for the duration of the COVID-19 declaration under Section 564(b)(1) of the Act, 21 U.S.C. section 360bbb-3(b)(1), unless the  authorization is terminated or revoked.  Performed at [facility], [address], [place], [place] [address]     Labs: CBC: Recent Labs  Lab 08/20/23 1316 08/20/23 1421 08/21/23 0538  WBC 4.3  --  7.5  NEUTROABS 3.6  --   --   HGB 10.7* 10.5* 10.9*  HCT 33.7* 31.0* 32.6*  MCV 98.3  --  96.4  PLT 169  --  168   Basic Metabolic Panel: Recent Labs  Lab 08/20/23 1316 08/20/23 1421 08/21/23 0538  NA 139 140 137  K 3.9 3.9 3.7  CL 103  --  102  CO2 26  --  25  GLUCOSE 120*  --  115*  BUN 18  --  16  CREATININE 0.79  --  0.61  CALCIUM 9.1  --  9.2   Liver Function Tests: Recent Labs  Lab 08/20/23 1316  AST 20  ALT 9  ALKPHOS 98  BILITOT 0.6  PROT 6.5  ALBUMIN 2.9*   CBG: No results for input(s): "GLUCAP" in the last 168 hours.  Discharge time spent: greater than 30 minutes.  Signed: Meredeth Ide, MD [facility] 08/22/2023

## 2023-08-22 NOTE — Plan of Care (Signed)
Problem: Education: Goal: Knowledge of General Education information will improve Description: Including pain rating scale, medication(s)/side effects and non-pharmacologic comfort measures Outcome: Progressing Pt was admitted into the hospital for shortness of breath and very mild left-sided chest pain. SOB worsening with exertion.  He has an admitting diagnosis of COPD exacerbations.  Pt is currently awaiting discharge to self care.    Problem: Clinical Measurements: Goal: Will remain free from infection Outcome: Progressing S/Sx of infection monitored and assessed q-shift.  Pt has remained afebrile thus far.  He is on PO abx per MD's orders.    Problem: Clinical Measurements: Goal: Respiratory complications will improve Outcome: Progressing Respiratory status monitored and assessed q-shift.  Pt is on room air with PO2 at 93-96% and respiration rate of 16 breaths per minute.  Pt has not endorsed c/o SOB or DOE.    Problem: Clinical Measurements: Goal: Cardiovascular complication will be avoided Outcome: Progressing Pt's VS WNL thus far.     Problem: Activity: Goal: Risk for activity intolerance will decrease Outcome: Progressing Pt is independent of all his ADLs.  He can get up OOB with the assistance of RN staff.     Problem: Nutrition: Goal: Adequate nutrition will be maintained Outcome: Progressing Pt is on a regular diet per MD's orders and can tolerate it w/o s/sx of abdominal pain/ distention or n/v.    Problem: Safety: Goal: Ability to remain free from injury will improve Outcome: Progressing Pt has remained free from falls thus far.  Instructed pt to utilize RN call light for assistance.  Hourly rounds performed.  Bed alarm implemented to  keep safe from falls.  Settings activated to third most sensitive mode.  Bed in lowest position, locked with two upper side rails engaged.  Belongings and call light within reach.    Problem: Skin Integrity: Goal: Risk for impaired  skin integrity will decrease Outcome: Progressing Skin integrity monitored and assessed q-shift.  Instructed pt to turn q2 hours to prevent further skin impairment.  Tubes and drains assessed for device related pressures sores.  Pt is continent of both bowel and bladder.  Dressing changes performed per MD's orders.    Problem: Pain Management: Goal: General experience of comfort will improve Outcome: Progressing Pt has endorsed c/o 5-6/10 mid upper/mid/ lower back pain describing it as a constant ache.  Reiterated pain scale so he could adequately rate his pain.  Pt stated his pain goal this admission would be 0/10.  Discussed nonpharmacological methods to help reduce s/sx of pain.  Interventions given per pt's request and MD's orders.

## 2023-08-22 NOTE — Progress Notes (Signed)
Pt to be discharge home to self care. Reviewed AVS, informed pt of changes to his medications and that he had pending prescriptions to be picked up from the Outpatient Pharmacy ([facility]). Made pt aware of follow up appointments.  Answered any pending questions. Pt had no further questions. Removed PIV which was CDI and free from s/sx of infection. Assisted pt in gathering his belongings. Pt was escorted to the discharge lounge by Lorie Phenix RN to wait for his ride home. Pt discharged in stable condition.

## 2023-12-07 ENCOUNTER — Encounter (HOSPITAL_COMMUNITY): Payer: Self-pay | Admitting: Emergency Medicine

## 2023-12-07 ENCOUNTER — Inpatient Hospital Stay (HOSPITAL_COMMUNITY)
Admission: EM | Admit: 2023-12-07 | Discharge: 2023-12-10 | DRG: 871 | Disposition: A | Discharge status: Home / Self Care | Payer: No Typology Code available for payment source | Attending: Internal Medicine | Admitting: Internal Medicine

## 2023-12-07 ENCOUNTER — Other Ambulatory Visit: Payer: Self-pay

## 2023-12-07 ENCOUNTER — Emergency Department (HOSPITAL_COMMUNITY): Payer: No Typology Code available for payment source

## 2023-12-07 DIAGNOSIS — C3411 Malignant neoplasm of upper lobe, right bronchus or lung: Secondary | ICD-10-CM | POA: Diagnosis present

## 2023-12-07 DIAGNOSIS — A419 Sepsis, unspecified organism: Principal | ICD-10-CM

## 2023-12-07 DIAGNOSIS — J440 Chronic obstructive pulmonary disease with acute lower respiratory infection: Secondary | ICD-10-CM | POA: Diagnosis present

## 2023-12-07 DIAGNOSIS — F17210 Nicotine dependence, cigarettes, uncomplicated: Secondary | ICD-10-CM | POA: Diagnosis present

## 2023-12-07 DIAGNOSIS — J441 Chronic obstructive pulmonary disease with (acute) exacerbation: Secondary | ICD-10-CM | POA: Diagnosis present

## 2023-12-07 DIAGNOSIS — F32A Depression, unspecified: Secondary | ICD-10-CM | POA: Diagnosis present

## 2023-12-07 DIAGNOSIS — G894 Chronic pain syndrome: Secondary | ICD-10-CM | POA: Diagnosis present

## 2023-12-07 DIAGNOSIS — Z66 Do not resuscitate: Secondary | ICD-10-CM | POA: Diagnosis present

## 2023-12-07 DIAGNOSIS — I2510 Atherosclerotic heart disease of native coronary artery without angina pectoris: Secondary | ICD-10-CM | POA: Diagnosis present

## 2023-12-07 DIAGNOSIS — G9341 Metabolic encephalopathy: Secondary | ICD-10-CM | POA: Diagnosis present

## 2023-12-07 DIAGNOSIS — R32 Unspecified urinary incontinence: Secondary | ICD-10-CM | POA: Diagnosis present

## 2023-12-07 DIAGNOSIS — E782 Mixed hyperlipidemia: Secondary | ICD-10-CM | POA: Diagnosis not present

## 2023-12-07 DIAGNOSIS — Z8249 Family history of ischemic heart disease and other diseases of the circulatory system: Secondary | ICD-10-CM | POA: Diagnosis not present

## 2023-12-07 DIAGNOSIS — E785 Hyperlipidemia, unspecified: Secondary | ICD-10-CM | POA: Diagnosis present

## 2023-12-07 DIAGNOSIS — Z7189 Other specified counseling: Secondary | ICD-10-CM | POA: Diagnosis not present

## 2023-12-07 DIAGNOSIS — J9601 Acute respiratory failure with hypoxia: Secondary | ICD-10-CM | POA: Diagnosis present

## 2023-12-07 DIAGNOSIS — Z1152 Encounter for screening for COVID-19: Secondary | ICD-10-CM

## 2023-12-07 DIAGNOSIS — R54 Age-related physical debility: Secondary | ICD-10-CM | POA: Diagnosis present

## 2023-12-07 DIAGNOSIS — C3432 Malignant neoplasm of lower lobe, left bronchus or lung: Secondary | ICD-10-CM | POA: Diagnosis present

## 2023-12-07 DIAGNOSIS — E861 Hypovolemia: Secondary | ICD-10-CM | POA: Diagnosis present

## 2023-12-07 DIAGNOSIS — Z888 Allergy status to other drugs, medicaments and biological substances status: Secondary | ICD-10-CM

## 2023-12-07 DIAGNOSIS — E871 Hypo-osmolality and hyponatremia: Secondary | ICD-10-CM

## 2023-12-07 DIAGNOSIS — E860 Dehydration: Secondary | ICD-10-CM | POA: Diagnosis present

## 2023-12-07 DIAGNOSIS — Z716 Tobacco abuse counseling: Secondary | ICD-10-CM

## 2023-12-07 DIAGNOSIS — R6520 Severe sepsis without septic shock: Secondary | ICD-10-CM

## 2023-12-07 DIAGNOSIS — J189 Pneumonia, unspecified organism: Secondary | ICD-10-CM | POA: Diagnosis present

## 2023-12-07 DIAGNOSIS — M419 Scoliosis, unspecified: Secondary | ICD-10-CM | POA: Diagnosis present

## 2023-12-07 DIAGNOSIS — Z7982 Long term (current) use of aspirin: Secondary | ICD-10-CM

## 2023-12-07 DIAGNOSIS — R7989 Other specified abnormal findings of blood chemistry: Secondary | ICD-10-CM

## 2023-12-07 DIAGNOSIS — Z515 Encounter for palliative care: Secondary | ICD-10-CM | POA: Diagnosis not present

## 2023-12-07 DIAGNOSIS — N179 Acute kidney failure, unspecified: Principal | ICD-10-CM

## 2023-12-07 DIAGNOSIS — E876 Hypokalemia: Secondary | ICD-10-CM

## 2023-12-07 DIAGNOSIS — Z7952 Long term (current) use of systemic steroids: Secondary | ICD-10-CM

## 2023-12-07 DIAGNOSIS — D638 Anemia in other chronic diseases classified elsewhere: Secondary | ICD-10-CM | POA: Diagnosis present

## 2023-12-07 DIAGNOSIS — Z951 Presence of aortocoronary bypass graft: Secondary | ICD-10-CM

## 2023-12-07 DIAGNOSIS — R4182 Altered mental status, unspecified: Secondary | ICD-10-CM

## 2023-12-07 DIAGNOSIS — Z820 Family history of epilepsy and other diseases of the nervous system: Secondary | ICD-10-CM

## 2023-12-07 DIAGNOSIS — Z833 Family history of diabetes mellitus: Secondary | ICD-10-CM

## 2023-12-07 DIAGNOSIS — Z79899 Other long term (current) drug therapy: Secondary | ICD-10-CM

## 2023-12-07 DIAGNOSIS — I10 Essential (primary) hypertension: Secondary | ICD-10-CM | POA: Diagnosis present

## 2023-12-07 DIAGNOSIS — K219 Gastro-esophageal reflux disease without esophagitis: Secondary | ICD-10-CM | POA: Diagnosis present

## 2023-12-07 LAB — CBC WITH DIFFERENTIAL/PLATELET
Abs Immature Granulocytes: 0.11 10*3/uL — ABNORMAL HIGH (ref 0.00–0.07)
Basophils Absolute: 0 10*3/uL (ref 0.0–0.1)
Basophils Relative: 0 %
Eosinophils Absolute: 0 10*3/uL (ref 0.0–0.5)
Eosinophils Relative: 0 %
HCT: 29.5 % — ABNORMAL LOW (ref 39.0–52.0)
Hemoglobin: 10 g/dL — ABNORMAL LOW (ref 13.0–17.0)
Immature Granulocytes: 1 %
Lymphocytes Relative: 6 %
Lymphs Abs: 0.8 10*3/uL (ref 0.7–4.0)
MCH: 31.3 pg (ref 26.0–34.0)
MCHC: 33.9 g/dL (ref 30.0–36.0)
MCV: 92.5 fL (ref 80.0–100.0)
Monocytes Absolute: 0.9 10*3/uL (ref 0.1–1.0)
Monocytes Relative: 7 %
Neutro Abs: 11.2 10*3/uL — ABNORMAL HIGH (ref 1.7–7.7)
Neutrophils Relative %: 86 %
Platelets: 259 10*3/uL (ref 150–400)
RBC: 3.19 MIL/uL — ABNORMAL LOW (ref 4.22–5.81)
RDW: 13.5 % (ref 11.5–15.5)
WBC: 13.1 10*3/uL — ABNORMAL HIGH (ref 4.0–10.5)
nRBC: 0 % (ref 0.0–0.2)

## 2023-12-07 LAB — COMPREHENSIVE METABOLIC PANEL
ALT: 44 U/L (ref 0–44)
AST: 55 U/L — ABNORMAL HIGH (ref 15–41)
Albumin: 2.6 g/dL — ABNORMAL LOW (ref 3.5–5.0)
Alkaline Phosphatase: 114 U/L (ref 38–126)
Anion gap: 13 (ref 5–15)
BUN: 24 mg/dL — ABNORMAL HIGH (ref 8–23)
CO2: 23 mmol/L (ref 22–32)
Calcium: 9.7 mg/dL (ref 8.9–10.3)
Chloride: 94 mmol/L — ABNORMAL LOW (ref 98–111)
Creatinine, Ser: 1.75 mg/dL — ABNORMAL HIGH (ref 0.61–1.24)
GFR, Estimated: 39 mL/min — ABNORMAL LOW (ref >60)
Glucose, Bld: 129 mg/dL — ABNORMAL HIGH (ref 70–99)
Potassium: 2.6 mmol/L — CL (ref 3.5–5.1)
Sodium: 130 mmol/L — ABNORMAL LOW (ref 135–145)
Total Bilirubin: 0.5 mg/dL (ref 0.0–1.2)
Total Protein: 7.4 g/dL (ref 6.5–8.1)

## 2023-12-07 LAB — I-STAT CHEM 8, ED
BUN: 25 mg/dL — ABNORMAL HIGH (ref 8–23)
Calcium, Ion: 1.24 mmol/L (ref 1.15–1.40)
Chloride: 94 mmol/L — ABNORMAL LOW (ref 98–111)
Creatinine, Ser: 1.8 mg/dL — ABNORMAL HIGH (ref 0.61–1.24)
Glucose, Bld: 110 mg/dL — ABNORMAL HIGH (ref 70–99)
HCT: 31 % — ABNORMAL LOW (ref 39.0–52.0)
Hemoglobin: 10.5 g/dL — ABNORMAL LOW (ref 13.0–17.0)
Potassium: 2.7 mmol/L — CL (ref 3.5–5.1)
Sodium: 131 mmol/L — ABNORMAL LOW (ref 135–145)
TCO2: 26 mmol/L (ref 22–32)

## 2023-12-07 LAB — URINALYSIS, W/ REFLEX TO CULTURE (INFECTION SUSPECTED)
Bilirubin Urine: NEGATIVE
Glucose, UA: NEGATIVE mg/dL
Ketones, ur: NEGATIVE mg/dL
Leukocytes,Ua: NEGATIVE
Nitrite: NEGATIVE
Protein, ur: >=300 mg/dL — AB
Specific Gravity, Urine: 1.014 (ref 1.005–1.030)
pH: 6 (ref 5.0–8.0)

## 2023-12-07 LAB — RESP PANEL BY RT-PCR (RSV, FLU A&B, COVID)  RVPGX2
Influenza A by PCR: NEGATIVE
Influenza B by PCR: NEGATIVE
Resp Syncytial Virus by PCR: NEGATIVE
SARS Coronavirus 2 by RT PCR: NEGATIVE

## 2023-12-07 LAB — TROPONIN I (HIGH SENSITIVITY): Troponin I (High Sensitivity): 39 ng/L — ABNORMAL HIGH (ref <18)

## 2023-12-07 LAB — CBG MONITORING, ED: Glucose-Capillary: 137 mg/dL — ABNORMAL HIGH (ref 70–99)

## 2023-12-07 LAB — PROTIME-INR
INR: 1.1 (ref 0.8–1.2)
Prothrombin Time: 14.6 s (ref 11.4–15.2)

## 2023-12-07 LAB — I-STAT CG4 LACTIC ACID, ED: Lactic Acid, Venous: 0.7 mmol/L (ref 0.5–1.9)

## 2023-12-07 LAB — BRAIN NATRIURETIC PEPTIDE: B Natriuretic Peptide: 197.3 pg/mL — ABNORMAL HIGH (ref 0.0–100.0)

## 2023-12-07 MED ORDER — LACTATED RINGERS IV SOLN: INTRAVENOUS | Status: AC

## 2023-12-07 MED ORDER — POTASSIUM CHLORIDE 20 MEQ PO PACK
40.0000 meq | PACK | Freq: Once | ORAL | Status: AC
  Administered 2023-12-07: 40 meq via ORAL
  Filled 2023-12-07: qty 2

## 2023-12-07 MED ORDER — METRONIDAZOLE 500 MG/100ML IV SOLN
500.0000 mg | Freq: Once | INTRAVENOUS | Status: AC
  Administered 2023-12-07: 500 mg via INTRAVENOUS
  Filled 2023-12-07: qty 100

## 2023-12-07 MED ORDER — LIDOCAINE 5 % EX PTCH
1.0000 | MEDICATED_PATCH | CUTANEOUS | Status: DC
  Administered 2023-12-07 – 2023-12-09 (×3): 1 via TRANSDERMAL
  Filled 2023-12-07 (×3): qty 1

## 2023-12-07 MED ORDER — LACTATED RINGERS IV BOLUS (SEPSIS)
500.0000 mL | Freq: Once | INTRAVENOUS | Status: AC
  Administered 2023-12-07: 500 mL via INTRAVENOUS

## 2023-12-07 MED ORDER — POTASSIUM CHLORIDE 10 MEQ/100ML IV SOLN: 10.0000 meq | INTRAVENOUS | Status: DC

## 2023-12-07 MED ORDER — LACTATED RINGERS IV BOLUS (SEPSIS)
250.0000 mL | Freq: Once | INTRAVENOUS | Status: AC
  Administered 2023-12-07: 250 mL via INTRAVENOUS

## 2023-12-07 MED ORDER — LACTATED RINGERS IV BOLUS (SEPSIS)
1000.0000 mL | Freq: Once | INTRAVENOUS | Status: AC
  Administered 2023-12-07: 1000 mL via INTRAVENOUS

## 2023-12-07 MED ORDER — VANCOMYCIN HCL IN DEXTROSE 1-5 GM/200ML-% IV SOLN
1000.0000 mg | Freq: Once | INTRAVENOUS | Status: AC
  Administered 2023-12-07: 1000 mg via INTRAVENOUS
  Filled 2023-12-07: qty 200

## 2023-12-07 MED ORDER — POTASSIUM CHLORIDE 10 MEQ/100ML IV SOLN
10.0000 meq | INTRAVENOUS | Status: AC
  Administered 2023-12-07 – 2023-12-08 (×4): 10 meq via INTRAVENOUS
  Filled 2023-12-07 (×4): qty 100

## 2023-12-07 MED ORDER — ACETAMINOPHEN 500 MG PO TABS
1000.0000 mg | ORAL_TABLET | Freq: Once | ORAL | Status: AC
  Administered 2023-12-07: 1000 mg via ORAL
  Filled 2023-12-07: qty 2

## 2023-12-07 MED ORDER — SODIUM CHLORIDE 0.9 % IV SOLN
2.0000 g | Freq: Once | INTRAVENOUS | Status: AC
  Administered 2023-12-07: 2 g via INTRAVENOUS
  Filled 2023-12-07: qty 12.5

## 2023-12-07 NOTE — ED Triage Notes (Addendum)
 Pt arrives POV with neighbor who states she and pt's daughter have noticed weakness and AMS increasingly x 2 days. Pt's sats 85% on RA on ED arrival, placed on 4LNC O2 and sats improved, doesn't wear home O2. Pt does report frequent falls and incontinence lately, +cough with yellow sputum. Reports L hip pain after falling today, no thinners or LOC

## 2023-12-07 NOTE — ED Notes (Signed)
 Patient transported to CT

## 2023-12-07 NOTE — ED Notes (Signed)
 Bladder scan resulted at .

## 2023-12-07 NOTE — ED Provider Triage Note (Signed)
 Emergency Medicine Provider Triage Evaluation Note  [name] , a 78 y.o. male  was evaluated in triage.  Pt complains of SOB, AMS.  Review of Systems  Positive:  Negative:   Physical Exam  BP (!) 107/49 (BP Location: Right Arm)   Pulse 85   Temp (!) 97.4 F (36.3 C) (Oral)   Resp (!) 22   Wt 56 kg   SpO2 95%   BMI 18.77 kg/m  Gen:   Awake, no distress   Resp:  Normal effort  MSK:   Moves extremities without difficulty  Other:    Medical Decision Making  Medically screening exam initiated at 8:15 PM.  Appropriate orders placed.  Angelgabriel R Booth was informed that the remainder of the evaluation will be completed by another provider, this initial triage assessment does not replace that evaluation, and the importance of remaining in the ED until their evaluation is complete.  Increasing AMS x2 days. [name] today and has left hip pain but denies head trauma or LOC. Hx of COPD but does not have home O2 requirement. Currently on 2L to maintaine O2 >90%. Also with productive cough. Also with urinary incontinence.    [name], [place] 12/07/23 2017

## 2023-12-07 NOTE — Sepsis Progress Note (Signed)
 Elink following code sepsis

## 2023-12-07 NOTE — ED Provider Notes (Signed)
 [place] [facility]MENT AT [facility] Provider Note   CSN: [id number] Arrival date & time: 12/07/23  1945     History  Chief Complaint  Patient presents with   Weakness    Matthew R Gwynne is a 78 y.o. male.  78 year old male with past medical history significant for COPD presents today for concern of altered mental status, cough, urinary incontinence.  Patient lives alone.  His neighbor who he is very close with checks on him daily.  Today he was not acting like himself.  He has been having jerking, he has been "talking out of his head" according to his neighbor who is at bedside.  He had a hard time recognizing his neighbor earlier today which is unusual for him.  Patient has also been hallucinating and has been seeing people that have passed away for a while or do not live around him anymore.  Collateral obtained from daughter who states since last week he has been starting to forget things and she has been concerned about dehydration and UTI because he drinks significant amount of soft drinks.  Today her daughter was notified by her neighbor that patient has become significantly confused and he was hallucinating.  The history is provided by the patient. No language interpreter was used.       Home Medications Prior to Admission medications   Medication Sig Start Date End Date Taking? Authorizing Provider  acetaminophen (TYLENOL) 500 MG tablet Take 1,000 mg by mouth every 6 (six) hours as needed for mild pain (pain score 1-3) or moderate pain (pain score 4-6).    [provider]  aspirin EC 81 MG tablet Take 1 tablet (81 mg total) by mouth daily. 02/24/18   Robbie Lis M, PA-C  azithromycin (ZITHROMAX) 500 MG tablet Take 1 tablet (500 mg total) by mouth daily. 08/23/23   Meredeth Ide, MD  cyanocobalamin 500 MCG tablet Take 500 mcg by mouth 2 (two) times daily. VITAMIN B-12 08/18/14   [provider]  donepezil (ARICEPT) 10 MG tablet Take 5  mg by mouth daily. 08/31/21   [provider]  escitalopram (LEXAPRO) 20 MG tablet Take 1 tablet (20 mg total) by mouth daily. Patient taking differently: Take 20 mg by mouth in the morning. 04/12/14   Kathleene Hazel, MD  feeding supplement (ENSURE ENLIVE / ENSURE PLUS) LIQD Take 237 mLs by mouth 3 (three) times daily between meals. Patient taking differently: Take 237 mLs by mouth 2 (two) times daily between meals. 07/30/23   Enedina Finner, MD  folic acid (FOLVITE) 1 MG tablet Take 1 mg by mouth daily. 10/01/21   [provider]  gabapentin (NEURONTIN) 400 MG capsule Take 400 mg by mouth 2 (two) times daily.    [provider]  ibuprofen (ADVIL) 800 MG tablet Take 800 mg by mouth every 6 (six) hours as needed for mild pain (pain score 1-3), moderate pain (pain score 4-6) or headache. Takes with Oxy if needed    [provider]  ipratropium-albuterol (DUONEB) 0.5-2.5 (3) MG/3ML SOLN Take 3 mLs by nebulization 2 (two) times daily. 07/30/23   Enedina Finner, MD  montelukast (SINGULAIR) 10 MG tablet TAKE 1 TABLET AT BEDTIME 04/28/20   Johnna Acosta, NP  morphine (MS CONTIN) 15 MG 12 hr tablet Take 15 mg by mouth 2 (two) times daily.    [provider]  oxyCODONE-acetaminophen (PERCOCET) 5-325 MG per tablet Take 1 tablet by mouth every 4 (four) hours as  needed for moderate pain. For pain    [provider]  predniSONE (DELTASONE) 10 MG tablet Take 10 mg by mouth daily. 08/13/23   [provider]  rosuvastatin (CRESTOR) 40 MG tablet Take 1 tablet (40 mg total) by mouth daily. Patient taking differently: Take 40 mg by mouth every evening. 06/04/23   Tereso Newcomer T, PA-C  Vitamin D, Ergocalciferol, (DRISDOL) 50000 UNITS CAPS capsule Take 50,000 Units by mouth every Wednesday.  12/15/14   [provider]      Allergies    Levofloxacin and Doxycycline    Review of Systems   Review of Systems  Constitutional:  Negative for fever.   Respiratory:  Positive for cough and shortness of breath.   Cardiovascular:  Negative for chest pain.  Gastrointestinal:  Negative for abdominal pain.  Genitourinary:  Positive for difficulty urinating and frequency.  Neurological:  Negative for light-headedness.  All other systems reviewed and are negative.   Physical Exam Updated Vital Signs BP (!) 132/49   Pulse 86   Temp (!) 97.4 F (36.3 C) (Oral)   Resp 18   Wt 56 kg   SpO2 94%   BMI 18.77 kg/m  Physical Exam Vitals and nursing note reviewed.  Constitutional:      General: He is not in acute distress.    Appearance: Normal appearance. He is not ill-appearing.  HENT:     Head: Normocephalic and atraumatic.     Nose: Nose normal.  Eyes:     General: No scleral icterus.    Extraocular Movements: Extraocular movements intact.     Conjunctiva/sclera: Conjunctivae normal.  Cardiovascular:     Rate and Rhythm: Normal rate and regular rhythm.  Pulmonary:     Effort: Pulmonary effort is normal. No respiratory distress.     Breath sounds: Normal breath sounds. No wheezing.     Comments: Coarse lung sounds in the lower lung fields Abdominal:     General: There is no distension.     Tenderness: There is abdominal tenderness. There is guarding.  Musculoskeletal:        General: Normal range of motion.     Cervical back: Normal range of motion.  Skin:    General: Skin is warm and dry.  Neurological:     General: No focal deficit present.     Mental Status: He is alert. Mental status is at baseline.     ED Results / Procedures / Treatments   Labs (all labs ordered are listed, but only abnormal results are displayed) Labs Reviewed  COMPREHENSIVE METABOLIC PANEL - Abnormal; Notable for the following components:      Result Value   Sodium 130 (*)    Potassium 2.6 (*)    Chloride 94 (*)    Glucose, Bld 129 (*)    BUN 24 (*)    Creatinine, Ser 1.75 (*)    Albumin 2.6 (*)    AST 55 (*)    GFR, Estimated 39 (*)     All other components within normal limits  CBC WITH DIFFERENTIAL/PLATELET - Abnormal; Notable for the following components:   WBC 13.1 (*)    RBC 3.19 (*)    Hemoglobin 10.0 (*)    HCT 29.5 (*)    Neutro Abs 11.2 (*)    Abs Immature Granulocytes 0.11 (*)    All other components within normal limits  BRAIN NATRIURETIC PEPTIDE - Abnormal; Notable for the following components:   B Natriuretic Peptide 197.3 (*)  All other components within normal limits  CBG MONITORING, ED - Abnormal; Notable for the following components:   Glucose-Capillary 137 (*)    All other components within normal limits  I-STAT CHEM 8, ED - Abnormal; Notable for the following components:   Sodium 131 (*)    Potassium 2.7 (*)    Chloride 94 (*)    BUN 25 (*)    Creatinine, Ser 1.80 (*)    Glucose, Bld 110 (*)    Hemoglobin 10.5 (*)    HCT 31.0 (*)    All other components within normal limits  TROPONIN I (HIGH SENSITIVITY) - Abnormal; Notable for the following components:   Troponin I (High Sensitivity) 39 (*)    All other components within normal limits  CULTURE, BLOOD (ROUTINE X 2)  CULTURE, BLOOD (ROUTINE X 2)  RESP PANEL BY RT-PCR (RSV, FLU A&B, COVID)  RVPGX2  PROTIME-INR  URINALYSIS, W/ REFLEX TO CULTURE (INFECTION SUSPECTED)  I-STAT CG4 LACTIC ACID, ED    EKG None  Radiology DG Chest 2 View Result Date: 12/07/2023 CLINICAL DATA:  Fall, suspected sepsis EXAM: CHEST - 2 VIEW COMPARISON:  08/20/2023 FINDINGS: Mild left basilar scarring/atelectasis with volume loss. Right lung is clear. Small bilateral pleural effusions. No pneumothorax. The heart is normal in size.  Thoracic aortic atherosclerosis. Exaggerated lower thoracic kyphosis. Lumbar spine fixation hardware, incompletely visualized. IMPRESSION: Small bilateral pleural effusions. Electronically Signed   By: Charline Bills M.D.   On: 12/07/2023 20:44   DG Hip Unilat W or Wo Pelvis 2-3 Views Left Result Date: 12/07/2023 CLINICAL DATA:  Fall  EXAM: DG HIP (WITH OR WITHOUT PELVIS) 2-3V LEFT COMPARISON:  None Available. FINDINGS: No fracture or dislocation is seen. Bilateral hip joint spaces are preserved. Visualized bony pelvis appears intact. Postsurgical changes involving the lower lumbar spine, incompletely visualized. IMPRESSION: Negative. Electronically Signed   By: Charline Bills M.D.   On: 12/07/2023 20:43    Procedures .Critical Care  Performed by: Marita Kansas, PA-C Authorized by: Marita Kansas, PA-C   Critical care provider statement:    Critical care time (minutes):  35   Critical care was necessary to treat or prevent imminent or life-threatening deterioration of the following conditions:  Sepsis   Critical care was time spent personally by me on the following activities:  Development of treatment plan with patient or surrogate, discussions with consultants, evaluation of patient's response to treatment, examination of patient, ordering and review of laboratory studies, ordering and review of radiographic studies, ordering and performing treatments and interventions, pulse oximetry, re-evaluation of patient's condition and review of old charts   Care discussed with: admitting provider       Medications Ordered in ED Medications  lactated ringers infusion (has no administration in time range)  metroNIDAZOLE (FLAGYL) IVPB 500 mg (500 mg Intravenous New Bag/Given 12/07/23 2202)  vancomycin (VANCOCIN) IVPB 1000 mg/200 mL premix (has no administration in time range)  potassium chloride (KLOR-CON) packet 40 mEq (has no administration in time range)  potassium chloride 10 mEq in 100 mL IVPB (has no administration in time range)  acetaminophen (TYLENOL) tablet 1,000 mg (has no administration in time range)  lidocaine (LIDODERM) 5 % 1 patch (has no administration in time range)  lactated ringers bolus 1,000 mL (1,000 mLs Intravenous New Bag/Given 12/07/23 2117)    And  lactated ringers bolus 500 mL (0 mLs Intravenous Stopped  12/07/23 2142)    And  lactated ringers bolus 250 mL (0 mLs Intravenous Stopped 12/07/23 2140)  ceFEPIme (MAXIPIME) 2 g in sodium chloride 0.9 % 100 mL IVPB (0 g Intravenous Stopped 12/07/23 2200)    ED Course/ Medical Decision Making/ A&P                                 Medical Decision Making Amount and/or Complexity of Data Reviewed Labs: ordered. Radiology: ordered.  Risk OTC drugs. Prescription drug management.   Medical Decision Making / ED Course   This patient presents to the ED for concern of hypoxia, urinary frequency, this involves an extensive number of treatment options, and is a complaint that carries with it a high risk of complications and morbidity.  The differential diagnosis includes sepsis, UTI, AKI, intracranial bleed, pneumonia, PE, ACS, acute hypoxic respiratory failure  MDM: 78 year old male presents today for concern of altered mental status, hallucination, urinary frequency, and was found to be hypoxic in triage.  CBC with mild leukocytosis of 13.1 with mild left shift.  Hemoglobin of 10.0.  Which is around his baseline.  CMP shows potassium of 2.6, glucose 129, sodium of 130.  P.o. repletion and IV repletion has been ordered.  Creatinine of 1.75.  UA shows no evidence of UTI.  BNP of 197, troponin elevated at 39.  BNP is only mildly elevated beyond his baseline.  Troponin is above his baseline.  Typically this is within normal.  Could be demand but will continue to trend.  X-rays show bilateral small pleural effusions, hip x-rays are negative.  Will obtain CT head, CT cervical spine, CT chest abdomen pelvis without contrast given his AKI.  CT shows evidence for potential pneumonia.  He has received broad-spectrum antibiotics which should be sufficient but can be tailored down while he is admitted.  No evidence of UTI.  Discussed with hospitalist.  He will evaluate patient for admission.  Lab Tests: -I ordered, reviewed, and interpreted labs.   The pertinent  results include:   Labs Reviewed  COMPREHENSIVE METABOLIC PANEL - Abnormal; Notable for the following components:      Result Value   Sodium 130 (*)    Potassium 2.6 (*)    Chloride 94 (*)    Glucose, Bld 129 (*)    BUN 24 (*)    Creatinine, Ser 1.75 (*)    Albumin 2.6 (*)    AST 55 (*)    GFR, Estimated 39 (*)    All other components within normal limits  CBC WITH DIFFERENTIAL/PLATELET - Abnormal; Notable for the following components:   WBC 13.1 (*)    RBC 3.19 (*)    Hemoglobin 10.0 (*)    HCT 29.5 (*)    Neutro Abs 11.2 (*)    Abs Immature Granulocytes 0.11 (*)    All other components within normal limits  BRAIN NATRIURETIC PEPTIDE - Abnormal; Notable for the following components:   B Natriuretic Peptide 197.3 (*)    All other components within normal limits  CBG MONITORING, ED - Abnormal; Notable for the following components:   Glucose-Capillary 137 (*)    All other components within normal limits  I-STAT CHEM 8, ED - Abnormal; Notable for the following components:   Sodium 131 (*)    Potassium 2.7 (*)    Chloride 94 (*)    BUN 25 (*)    Creatinine, Ser 1.80 (*)    Glucose, Bld 110 (*)    Hemoglobin 10.5 (*)    HCT 31.0 (*)  All other components within normal limits  TROPONIN I (HIGH SENSITIVITY) - Abnormal; Notable for the following components:   Troponin I (High Sensitivity) 39 (*)    All other components within normal limits  CULTURE, BLOOD (ROUTINE X 2)  CULTURE, BLOOD (ROUTINE X 2)  RESP PANEL BY RT-PCR (RSV, FLU A&B, COVID)  RVPGX2  PROTIME-INR  URINALYSIS, W/ REFLEX TO CULTURE (INFECTION SUSPECTED)  I-STAT CG4 LACTIC ACID, ED      EKG  EKG Interpretation Date/Time:    Ventricular Rate:    PR Interval:    QRS Duration:    QT Interval:    QTC Calculation:   R Axis:      Text Interpretation:           Imaging Studies ordered: I ordered imaging studies including cxr, left hip.  CT chest abdomen pelvis without contrast, CT head, CT cervical  spine ordered I independently visualized and interpreted imaging. I agree with the radiologist interpretation   Medicines ordered and prescription drug management: Meds ordered this encounter  Medications   lactated ringers infusion   AND Linked Order Group    lactated ringers bolus 1,000 mL     Total Body Weight basis for 30 mL/kg  bolus delivery:   56 kg    lactated ringers bolus 500 mL     Total Body Weight basis for 30 mL/kg  bolus delivery:   56 kg    lactated ringers bolus 250 mL     Total Body Weight basis for 30 mL/kg  bolus delivery:   56 kg   ceFEPIme (MAXIPIME) 2 g in sodium chloride 0.9 % 100 mL IVPB    Antibiotic Indication::   Other Indication (list below)    Other Indication::   Unknown Source.   metroNIDAZOLE (FLAGYL) IVPB 500 mg    Antibiotic Indication::   Other Indication (list below)    Other Indication::   Unknown Source.   vancomycin (VANCOCIN) IVPB 1000 mg/200 mL premix    Indication::   Other Indication (list below)    Other Indication::   Unknown Source.   DISCONTD: potassium chloride 10 mEq in 100 mL IVPB   potassium chloride (KLOR-CON) packet 40 mEq   potassium chloride 10 mEq in 100 mL IVPB   acetaminophen (TYLENOL) tablet 1,000 mg   lidocaine (LIDODERM) 5 % 1 patch    -I have reviewed the patients home medicines and have made adjustments as needed  Critical interventions Sepsis protocol ordered  Cardiac Monitoring: The patient was maintained on a cardiac monitor.  I personally viewed and interpreted the cardiac monitored which showed an underlying rhythm of: NSR  Reevaluation: After the interventions noted above, I reevaluated the patient and found that they have :stayed the same  Co morbidities that complicate the patient evaluation  Past Medical History:  Diagnosis Date   Anemia    Arthritis    Back pain    scoliosis   CAD (coronary artery disease) 03/24/2011   - S/p stent to RCA in 1999  - Cath in 2019 with patent stent and mod  nonobstructive disease elsewhere  - Myoview 06/05/23: EF 61, normal perfusion, low risk     COPD (chronic obstructive pulmonary disease) (HCC)    Depression    Diverticulosis    GERD (gastroesophageal reflux disease)    GI bleed    History of colon polyps    History of hiatal hernia    Hyperkalemia    Hyperlipidemia    Hypertension  Lung cancer (HCC) 06/04/2023   Radiation Rx; [facility]    Scoliosis    Tobacco abuse       Dispostion: Discussed with hospitalist Dr. Arlean Hopping.  They will evaluate patient for admission.   Final Clinical Impression(s) / ED Diagnoses Final diagnoses:  Sepsis with acute renal failure without septic shock, due to unspecified organism, unspecified acute renal failure type (HCC)  Altered mental status, unspecified altered mental status type    Rx / DC Orders ED Discharge Orders     None         Marita Kansas, PA-C 12/07/23 2337    Pricilla Loveless, MD 12/10/23 1214

## 2023-12-07 NOTE — H&P (Incomplete)
 History and Physical      [name] UJW:[id number] DOB: 1946-03-07 DOA: 12/07/2023; DOS: 12/08/2023  PCP: [facility] *** Patient coming from: home ***  I have personally briefly reviewed patient's old medical records in [facility]  Chief Complaint: ***  HPI: [name] is a 78 y.o. male with medical history significant for *** who is admitted to [facility] on 12/07/2023 with *** after presenting from home*** to [facility] ED complaining of ***.    ***       ***   ED Course:  Vital signs in the ED were notable for the following: ***  Labs were notable for the following: ***  Per my interpretation, EKG in ED demonstrated the following:  ***  Imaging in the ED, per corresponding formal radiology read, was notable for the following:  ***  While in the ED, the following were administered: ***  Subsequently, the patient was admitted  ***  ***red    Review of Systems: As per HPI otherwise 10 point review of systems negative.   Past Medical History:  Diagnosis Date   Anemia    Arthritis    Back pain    scoliosis   CAD (coronary artery disease) 03/24/2011   - S/p stent to RCA in 1999  - Cath in 2019 with patent stent and mod nonobstructive disease elsewhere  - Myoview 06/05/23: EF 61, normal perfusion, low risk     COPD (chronic obstructive pulmonary disease) (HCC)    Depression    Diverticulosis    GERD (gastroesophageal reflux disease)    GI bleed    History of colon polyps    History of hiatal hernia    Hyperkalemia    Hyperlipidemia    Hypertension    Lung cancer (HCC) 06/04/2023   Radiation Rx; [facility]    Scoliosis    Tobacco abuse     Past Surgical History:  Procedure Laterality Date   APPENDECTOMY  1957   BACK SURGERY     CARDIAC CATHETERIZATION  1999   stent placement RCA   CARDIAC CATHETERIZATION  2010   mild left CA distal stenosis, mild irregularities in RCA and LAD. patent setn in RCA    COLONOSCOPY WITH PROPOFOL N/A 06/16/2018   Procedure: COLONOSCOPY WITH PROPOFOL;  Surgeon: Toledo, Boykin Nearing, MD;  Location: [facility] ENDOSCOPY;  Service: Gastroenterology;  Laterality: N/A;   CORONARY ANGIOPLASTY     CORONARY ARTERY BYPASS GRAFT     denies 09/30/19   ESOPHAGOGASTRODUODENOSCOPY (EGD) WITH PROPOFOL N/A 06/16/2018   Procedure: ESOPHAGOGASTRODUODENOSCOPY (EGD) WITH PROPOFOL;  Surgeon: Toledo, Boykin Nearing, MD;  Location: [facility] ENDOSCOPY;  Service: Gastroenterology;  Laterality: N/A;   FOOT SURGERY  1973   left   LEFT HEART CATH AND CORONARY ANGIOGRAPHY N/A 01/22/2018   Procedure: LEFT HEART CATH AND CORONARY ANGIOGRAPHY;  Surgeon: Kathleene Hazel, MD;  Location: [facility] INVASIVE CV LAB;  Service: Cardiovascular;  Laterality: N/A;   LEFT HEART CATHETERIZATION WITH CORONARY ANGIOGRAM N/A 01/11/2015   Procedure: LEFT HEART CATHETERIZATION WITH CORONARY ANGIOGRAM;  Surgeon: Kathleene Hazel, MD;  Location: [facility] CATH LAB;  Service: Cardiovascular;  Laterality: N/A;   OLECRANON BURSECTOMY Right 10/04/2019   Procedure: OLECRANON BURSA;  Surgeon: Signa Kell, MD;  Location: [facility] ORS;  Service: Orthopedics;  Laterality: Right;   TRICEPS TENDON REPAIR Right 10/04/2019   Procedure: TRICEPS TENDON REPAIR AND PLECRANON BURSA EXCISION, POSSIBLE ALLOGRAFT AUGMENTATION.;  Surgeon: Signa Kell, MD;  Location: [facility] ORS;  Service: Orthopedics;  Laterality: Right;    Social History:  reports that he has been smoking cigarettes. He has a 17.5 pack-year smoking history. He has never used smokeless tobacco. He reports current alcohol use of about 6.0 standard drinks of alcohol per week. He reports that he does not use drugs.   Allergies  Allergen Reactions   Levofloxacin Itching and Rash   Doxycycline Nausea And Vomiting    Family History  Problem Relation Age of Onset   Alzheimer's disease Father    Angina Father    Diabetes Cousin        mothers side   Heart attack Cousin        fathers side    Hyperlipidemia Cousin        fathers side   Hypertension Cousin        fathers side    Family history reviewed and not pertinent ***   Prior to Admission medications   Medication Sig Start Date End Date Taking? Authorizing Provider  acetaminophen (TYLENOL) 500 MG tablet Take 1,000 mg by mouth every 6 (six) hours as needed for mild pain (pain score 1-3) or moderate pain (pain score 4-6).    [provider]  aspirin EC 81 MG tablet Take 1 tablet (81 mg total) by mouth daily. 02/24/18   Robbie Lis M, PA-C  azithromycin (ZITHROMAX) 500 MG tablet Take 1 tablet (500 mg total) by mouth daily. 08/23/23   Meredeth Ide, MD  cyanocobalamin 500 MCG tablet Take 500 mcg by mouth 2 (two) times daily. VITAMIN B-12 08/18/14   [provider]  donepezil (ARICEPT) 10 MG tablet Take 5 mg by mouth daily. 08/31/21   [provider]  escitalopram (LEXAPRO) 20 MG tablet Take 1 tablet (20 mg total) by mouth daily. Patient taking differently: Take 20 mg by mouth in the morning. 04/12/14   Kathleene Hazel, MD  feeding supplement (ENSURE ENLIVE / ENSURE PLUS) LIQD Take 237 mLs by mouth 3 (three) times daily between meals. Patient taking differently: Take 237 mLs by mouth 2 (two) times daily between meals. 07/30/23   Enedina Finner, MD  folic acid (FOLVITE) 1 MG tablet Take 1 mg by mouth daily. 10/01/21   [provider]  gabapentin (NEURONTIN) 400 MG capsule Take 400 mg by mouth 2 (two) times daily.    [provider]  ibuprofen (ADVIL) 800 MG tablet Take 800 mg by mouth every 6 (six) hours as needed for mild pain (pain score 1-3), moderate pain (pain score 4-6) or headache. Takes with Oxy if needed    [provider]  ipratropium-albuterol (DUONEB) 0.5-2.5 (3) MG/3ML SOLN Take 3 mLs by nebulization 2 (two) times daily. 07/30/23   Enedina Finner, MD  montelukast (SINGULAIR) 10 MG tablet TAKE 1 TABLET AT BEDTIME 04/28/20   Johnna Acosta, NP  morphine (MS  CONTIN) 15 MG 12 hr tablet Take 15 mg by mouth 2 (two) times daily.    [provider]  oxyCODONE-acetaminophen (PERCOCET) 5-325 MG per tablet Take 1 tablet by mouth every 4 (four) hours as needed for moderate pain. For pain    [provider]  predniSONE (DELTASONE) 10 MG tablet Take 10 mg by mouth daily. 08/13/23   [provider]  rosuvastatin (CRESTOR) 40 MG tablet Take 1 tablet (40 mg total) by mouth daily. Patient taking differently: Take 40 mg by mouth every evening. 06/04/23   Tereso Newcomer T, PA-C  Vitamin D, Ergocalciferol, (DRISDOL) 50000 UNITS CAPS capsule  Take 50,000 Units by mouth every Wednesday.  12/15/14   [provider]     Objective    Physical Exam: Vitals:   12/07/23 2315 12/07/23 2330 12/07/23 2345 12/07/23 2351  BP: (!) 102/53 (!) 88/47 (!) 87/49   Pulse: 73 69 69   Resp: 16 15 16    Temp:    (!) 97.3 F (36.3 C)  TempSrc:    Axillary  SpO2: 100% 100% 99%   Weight:        General: appears to be stated age; alert, oriented Skin: warm, dry, no rash Head:  AT/[place] Mouth:  Oral mucosa membranes appear moist, normal dentition Neck: supple; trachea midline Heart:  RRR; did not appreciate any M/R/G Lungs: CTAB, did not appreciate any wheezes, rales, or rhonchi Abdomen: + BS; soft, ND, NT Vascular: 2+ pedal pulses b/l; 2+ radial pulses b/l Extremities: no peripheral edema, no muscle wasting Neuro: strength and sensation intact in upper and lower extremities b/l ***   *** Neuro: 5/5 strength of the proximal and distal flexors and extensors of the upper and lower extremities bilaterally; sensation intact in upper and lower extremities b/l; cranial nerves II through XII grossly intact; no pronator drift; no evidence suggestive of slurred speech, dysarthria, or facial droop; Normal muscle tone. No tremors.  *** Neuro: In the setting of the patient's current mental status and associated inability to follow instructions, unable to  perform full neurologic exam at this time.  As such, assessment of strength, sensation, and cranial nerves is limited at this time. Patient noted to spontaneously move all 4 extremities. No tremors.  ***    Labs on Admission: I have personally reviewed following labs and imaging studies  CBC: Recent Labs  Lab 12/07/23 2012 12/07/23 2117  WBC 13.1*  --   NEUTROABS 11.2*  --   HGB 10.0* 10.5*  HCT 29.5* 31.0*  MCV 92.5  --   PLT 259  --    Basic Metabolic Panel: Recent Labs  Lab 12/07/23 2012 12/07/23 2117  NA 130* 131*  K 2.6* 2.7*  CL 94* 94*  CO2 23  --   GLUCOSE 129* 110*  BUN 24* 25*  CREATININE 1.75* 1.80*  CALCIUM 9.7  --    GFR: Estimated Creatinine Clearance: 26.4 mL/min (A) (by C-G formula based on SCr of 1.8 mg/dL (H)). Liver Function Tests: Recent Labs  Lab 12/07/23 2012  AST 55*  ALT 44  ALKPHOS 114  BILITOT 0.5  PROT 7.4  ALBUMIN 2.6*   No results for input(s): "LIPASE", "AMYLASE" in the last 168 hours. No results for input(s): "AMMONIA" in the last 168 hours. Coagulation Profile: Recent Labs  Lab 12/07/23 2012  INR 1.1   Cardiac Enzymes: No results for input(s): "CKTOTAL", "CKMB", "CKMBINDEX", "TROPONINI" in the last 168 hours. BNP (last 3 results) No results for input(s): "PROBNP" in the last 8760 hours. HbA1C: No results for input(s): "HGBA1C" in the last 72 hours. CBG: Recent Labs  Lab 12/07/23 2000  GLUCAP 137*   Lipid Profile: No results for input(s): "CHOL", "HDL", "LDLCALC", "TRIG", "CHOLHDL", "LDLDIRECT" in the last 72 hours. Thyroid Function Tests: No results for input(s): "TSH", "T4TOTAL", "FREET4", "T3FREE", "THYROIDAB" in the last 72 hours. Anemia Panel: No results for input(s): "VITAMINB12", "FOLATE", "FERRITIN", "TIBC", "IRON", "RETICCTPCT" in the last 72 hours. Urine analysis:    Component Value Date/Time   COLORURINE YELLOW 12/07/2023 2110   APPEARANCEUR HAZY (A) 12/07/2023 2110   LABSPEC 1.014 12/07/2023 2110    PHURINE 6.0  12/07/2023 2110   GLUCOSEU NEGATIVE 12/07/2023 2110   HGBUR MODERATE (A) 12/07/2023 2110   BILIRUBINUR NEGATIVE 12/07/2023 2110   KETONESUR NEGATIVE 12/07/2023 2110   PROTEINUR >=300 (A) 12/07/2023 2110   NITRITE NEGATIVE 12/07/2023 2110   LEUKOCYTESUR NEGATIVE 12/07/2023 2110    Radiological Exams on Admission: CT CHEST ABDOMEN PELVIS WO CONTRAST Result Date: 12/07/2023 CLINICAL DATA:  Fall, sepsis, history of lung cancer EXAM: CT CHEST, ABDOMEN AND PELVIS WITHOUT CONTRAST TECHNIQUE: Multidetector CT imaging of the chest, abdomen and pelvis was performed following the standard protocol without IV contrast. RADIATION DOSE REDUCTION: This exam was performed according to the departmental dose-optimization program which includes automated exposure control, adjustment of the mA and/or kV according to patient size and/or use of iterative reconstruction technique. COMPARISON:  CTA chest dated 08/20/2023 FINDINGS: CT CHEST FINDINGS Cardiovascular: The heart is normal in size. No pericardial effusion. Stable mild leftward cardiomediastinal shift. No evidence of thoracic aortic aneurysm. Atherosclerotic calcifications of the aortic arch. Moderate coronary atherosclerosis of the LAD and right coronary artery. Mediastinum/Nodes: Progressive mediastinal lymphadenopathy, including a dominant 2.4 cm subcarinal nodal mass (series 5/image 35), previously 16 mm. Visualized thyroid is unremarkable. Lungs/Pleura: Biapical pleural-parenchymal scarring. Moderate centrilobular and paraseptal emphysematous changes, upper lung predominant. Dominant 5.3 x 4.1 cm left lower lobe mass (series 7/image 108), previously 4.4 x 3.0 cm, likely corresponding to the patient's known primary bronchogenic carcinoma. Progressive patchy opacity extending inferiorly to the right lung base (series 7/image 128). Progressive patchy opacity with air bronchograms in the medial right upper lobe (series 7/image 49), favoring chronic  scarring and/or post treatment changes, although infection/pneumonia is also possible. Numerous subpleural patchy/nodular opacities in the lungs bilaterally, new/progressive from the prior. This appearance is unusual for infection given the subpleural distribution. Regardless, this is of little clinical consequence in this patient. Trace bilateral pleural effusions, stable on the left and new on the right. No pneumothorax. Musculoskeletal: Mild degenerative changes of the lower thoracic spine. No fracture is seen. CT ABDOMEN PELVIS FINDINGS Hepatobiliary: Unenhanced liver is unremarkable. Gallbladder is unremarkable. No intrahepatic or extrahepatic ductal dilatation. Pancreas: Within normal limits. Spleen: Within normal limits. Adrenals/Urinary Tract: Adrenal glands are within normal limits. Kidneys are within normal limits. No renal calculi or hydronephrosis. Mildly thick-walled bladder, although underdistended. Stomach/Bowel: Stomach is within normal limits. No evidence of bowel obstruction. Appendix is not discretely visualized Left colonic diverticulosis, without evidence of diverticulitis. Vascular/Lymphatic: No evidence of abdominal aortic aneurysm. Atherosclerotic calcifications of the abdominal aorta and branch vessels. No suspicious abdominopelvic lymphadenopathy. Reproductive: Prostate is unremarkable. Other: No abdominopelvic ascites. Musculoskeletal: Status post PLIF at L1-5. Mild degenerative changes. No fracture is seen. IMPRESSION: No evidence of traumatic injury in the chest, abdomen, or pelvis. Dominant 5.3 cm left lower lobe mass, likely corresponding to the patient's known primary bronchogenic carcinoma. Associated progressive thoracic nodal metastases. Progressive patchy opacity with air bronchograms in the medial right upper lobe, favoring chronic scarring and/or post treatment changes, although infection/pneumonia is also possible. No evidence of metastatic disease in the abdomen/pelvis.  Additional ancillary findings as above. Aortic Atherosclerosis (ICD10-I70.0) and Emphysema (ICD10-J43.9). Electronically Signed   By: Charline Bills M.D.   On: 12/07/2023 22:59   CT Head Wo Contrast Result Date: 12/07/2023 CLINICAL DATA:  Fall, sepsis EXAM: CT HEAD WITHOUT CONTRAST CT CERVICAL SPINE WITHOUT CONTRAST TECHNIQUE: Multidetector CT imaging of the head and cervical spine was performed following the standard protocol without intravenous contrast. Multiplanar CT image reconstructions of the cervical spine were also generated. RADIATION  DOSE REDUCTION: This exam was performed according to the departmental dose-optimization program which includes automated exposure control, adjustment of the mA and/or kV according to patient size and/or use of iterative reconstruction technique. COMPARISON:  MRI brain dated 07/29/2023 FINDINGS: CT HEAD FINDINGS Brain: No evidence of acute infarction, hemorrhage, hydrocephalus, extra-axial collection or mass lesion/mass effect. Subcortical white matter and periventricular small vessel ischemic changes. Vascular: Intracranial atherosclerosis. Skull: Normal. Negative for fracture or focal lesion. Sinuses/Orbits: The visualized paranasal sinuses are essentially clear. The mastoid air cells are unopacified. Other: None. CT CERVICAL SPINE FINDINGS Alignment: Normal cervical lordosis. Skull base and vertebrae: No acute fracture. No primary bone lesion or focal pathologic process. Soft tissues and spinal canal: No prevertebral fluid or swelling. No visible canal hematoma. Disc levels: Mild degenerative changes at C5-6. Spinal canal is patent. Upper chest: Evaluated on dedicated CT chest. Other: None. IMPRESSION: No acute intracranial abnormality. Small vessel ischemic changes. No traumatic injury to the cervical spine. Electronically Signed   By: Charline Bills M.D.   On: 12/07/2023 22:50   CT Cervical Spine Wo Contrast Result Date: 12/07/2023 CLINICAL DATA:  Fall, sepsis  EXAM: CT HEAD WITHOUT CONTRAST CT CERVICAL SPINE WITHOUT CONTRAST TECHNIQUE: Multidetector CT imaging of the head and cervical spine was performed following the standard protocol without intravenous contrast. Multiplanar CT image reconstructions of the cervical spine were also generated. RADIATION DOSE REDUCTION: This exam was performed according to the departmental dose-optimization program which includes automated exposure control, adjustment of the mA and/or kV according to patient size and/or use of iterative reconstruction technique. COMPARISON:  MRI brain dated 07/29/2023 FINDINGS: CT HEAD FINDINGS Brain: No evidence of acute infarction, hemorrhage, hydrocephalus, extra-axial collection or mass lesion/mass effect. Subcortical white matter and periventricular small vessel ischemic changes. Vascular: Intracranial atherosclerosis. Skull: Normal. Negative for fracture or focal lesion. Sinuses/Orbits: The visualized paranasal sinuses are essentially clear. The mastoid air cells are unopacified. Other: None. CT CERVICAL SPINE FINDINGS Alignment: Normal cervical lordosis. Skull base and vertebrae: No acute fracture. No primary bone lesion or focal pathologic process. Soft tissues and spinal canal: No prevertebral fluid or swelling. No visible canal hematoma. Disc levels: Mild degenerative changes at C5-6. Spinal canal is patent. Upper chest: Evaluated on dedicated CT chest. Other: None. IMPRESSION: No acute intracranial abnormality. Small vessel ischemic changes. No traumatic injury to the cervical spine. Electronically Signed   By: Charline Bills M.D.   On: 12/07/2023 22:50   DG Chest 2 View Result Date: 12/07/2023 CLINICAL DATA:  Fall, suspected sepsis EXAM: CHEST - 2 VIEW COMPARISON:  08/20/2023 FINDINGS: Mild left basilar scarring/atelectasis with volume loss. Right lung is clear. Small bilateral pleural effusions. No pneumothorax. The heart is normal in size.  Thoracic aortic atherosclerosis. Exaggerated  lower thoracic kyphosis. Lumbar spine fixation hardware, incompletely visualized. IMPRESSION: Small bilateral pleural effusions. Electronically Signed   By: Charline Bills M.D.   On: 12/07/2023 20:44   DG Hip Unilat W or Wo Pelvis 2-3 Views Left Result Date: 12/07/2023 CLINICAL DATA:  Fall EXAM: DG HIP (WITH OR WITHOUT PELVIS) 2-3V LEFT COMPARISON:  None Available. FINDINGS: No fracture or dislocation is seen. Bilateral hip joint spaces are preserved. Visualized bony pelvis appears intact. Postsurgical changes involving the lower lumbar spine, incompletely visualized. IMPRESSION: Negative. Electronically Signed   By: Charline Bills M.D.   On: 12/07/2023 20:43      Assessment/Plan   Principal Problem:   Acute metabolic encephalopathy   ***            ***                ***                 ***               ***               ***               ***                ***               ***               ***               ***               ***              ***          ***  DVT prophylaxis: SCD's ***  Code Status: Full code*** Family Communication: none*** Disposition Plan: Per Rounding Team Consults called: none***;  Admission status: ***     I SPENT GREATER THAN 75 *** MINUTES IN CLINICAL CARE TIME/MEDICAL DECISION-MAKING IN COMPLETING THIS ADMISSION.      Chaney Born Jalaiyah Throgmorton DO [facility]  From 7PM - 7AM   12/08/2023, 12:00 AM   ***

## 2023-12-08 ENCOUNTER — Inpatient Hospital Stay (HOSPITAL_COMMUNITY): Payer: No Typology Code available for payment source

## 2023-12-08 ENCOUNTER — Encounter (HOSPITAL_COMMUNITY): Payer: Self-pay | Admitting: Internal Medicine

## 2023-12-08 DIAGNOSIS — R7989 Other specified abnormal findings of blood chemistry: Secondary | ICD-10-CM

## 2023-12-08 DIAGNOSIS — G9341 Metabolic encephalopathy: Secondary | ICD-10-CM | POA: Diagnosis not present

## 2023-12-08 DIAGNOSIS — J9601 Acute respiratory failure with hypoxia: Secondary | ICD-10-CM

## 2023-12-08 DIAGNOSIS — E785 Hyperlipidemia, unspecified: Secondary | ICD-10-CM | POA: Diagnosis present

## 2023-12-08 DIAGNOSIS — F32A Depression, unspecified: Secondary | ICD-10-CM | POA: Diagnosis present

## 2023-12-08 DIAGNOSIS — E876 Hypokalemia: Secondary | ICD-10-CM | POA: Diagnosis present

## 2023-12-08 DIAGNOSIS — N179 Acute kidney failure, unspecified: Secondary | ICD-10-CM | POA: Diagnosis present

## 2023-12-08 DIAGNOSIS — R6520 Severe sepsis without septic shock: Secondary | ICD-10-CM | POA: Diagnosis present

## 2023-12-08 LAB — CBC WITH DIFFERENTIAL/PLATELET
Abs Immature Granulocytes: 0.05 10*3/uL (ref 0.00–0.07)
Basophils Absolute: 0 10*3/uL (ref 0.0–0.1)
Basophils Relative: 0 %
Eosinophils Absolute: 0 10*3/uL (ref 0.0–0.5)
Eosinophils Relative: 0 %
HCT: 25.5 % — ABNORMAL LOW (ref 39.0–52.0)
Hemoglobin: 8.3 g/dL — ABNORMAL LOW (ref 13.0–17.0)
Immature Granulocytes: 1 %
Lymphocytes Relative: 5 %
Lymphs Abs: 0.4 10*3/uL — ABNORMAL LOW (ref 0.7–4.0)
MCH: 31 pg (ref 26.0–34.0)
MCHC: 32.5 g/dL (ref 30.0–36.0)
MCV: 95.1 fL (ref 80.0–100.0)
Monocytes Absolute: 0.2 10*3/uL (ref 0.1–1.0)
Monocytes Relative: 2 %
Neutro Abs: 7.2 10*3/uL (ref 1.7–7.7)
Neutrophils Relative %: 92 %
Platelets: 201 10*3/uL (ref 150–400)
RBC: 2.68 MIL/uL — ABNORMAL LOW (ref 4.22–5.81)
RDW: 13.8 % (ref 11.5–15.5)
WBC: 7.8 10*3/uL (ref 4.0–10.5)
nRBC: 0 % (ref 0.0–0.2)

## 2023-12-08 LAB — BLOOD GAS, VENOUS
Acid-base deficit: 1.8 mmol/L (ref 0.0–2.0)
Bicarbonate: 24.7 mmol/L (ref 20.0–28.0)
O2 Saturation: 99.5 %
Patient temperature: 36.1
pCO2, Ven: 46 mm[Hg] (ref 44–60)
pH, Ven: 7.33 (ref 7.25–7.43)
pO2, Ven: 95 mm[Hg] — ABNORMAL HIGH (ref 32–45)

## 2023-12-08 LAB — COMPREHENSIVE METABOLIC PANEL
ALT: 33 U/L (ref 0–44)
AST: 42 U/L — ABNORMAL HIGH (ref 15–41)
Albumin: 2 g/dL — ABNORMAL LOW (ref 3.5–5.0)
Alkaline Phosphatase: 85 U/L (ref 38–126)
Anion gap: 7 (ref 5–15)
BUN: 20 mg/dL (ref 8–23)
CO2: 21 mmol/L — ABNORMAL LOW (ref 22–32)
Calcium: 8.9 mg/dL (ref 8.9–10.3)
Chloride: 104 mmol/L (ref 98–111)
Creatinine, Ser: 1.51 mg/dL — ABNORMAL HIGH (ref 0.61–1.24)
GFR, Estimated: 47 mL/min — ABNORMAL LOW (ref >60)
Glucose, Bld: 120 mg/dL — ABNORMAL HIGH (ref 70–99)
Potassium: 3.6 mmol/L (ref 3.5–5.1)
Sodium: 132 mmol/L — ABNORMAL LOW (ref 135–145)
Total Bilirubin: 0.6 mg/dL (ref 0.0–1.2)
Total Protein: 5.5 g/dL — ABNORMAL LOW (ref 6.5–8.1)

## 2023-12-08 LAB — RAPID URINE DRUG SCREEN, HOSP PERFORMED
Amphetamines: NOT DETECTED
Barbiturates: NOT DETECTED
Benzodiazepines: NOT DETECTED
Cocaine: NOT DETECTED
Opiates: POSITIVE — AB
Tetrahydrocannabinol: NOT DETECTED

## 2023-12-08 LAB — PROCALCITONIN: Procalcitonin: 0.52 ng/mL

## 2023-12-08 LAB — CK: Total CK: 414 U/L — ABNORMAL HIGH (ref 49–397)

## 2023-12-08 LAB — MAGNESIUM
Magnesium: 1.8 mg/dL (ref 1.7–2.4)
Magnesium: 1.7 mg/dL (ref 1.7–2.4)

## 2023-12-08 LAB — ECHOCARDIOGRAM COMPLETE
Area-P 1/2: 3.85 cm2
S' Lateral: 2.5 cm
Weight: 1975.32 [oz_av]

## 2023-12-08 LAB — CREATININE, URINE, RANDOM: Creatinine, Urine: 21 mg/dL

## 2023-12-08 LAB — SODIUM, URINE, RANDOM: Sodium, Ur: <10 mmol/L

## 2023-12-08 LAB — PROTEIN / CREATININE RATIO, URINE
Creatinine, Urine: 22 mg/dL
Protein Creatinine Ratio: 3.09 mg/mg{creat} — ABNORMAL HIGH (ref 0.00–0.15)
Total Protein, Urine: 68 mg/dL

## 2023-12-08 LAB — STREP PNEUMONIAE URINARY ANTIGEN: Strep Pneumo Urinary Antigen: NEGATIVE

## 2023-12-08 LAB — OSMOLALITY: Osmolality: 283 mosm/kg (ref 275–295)

## 2023-12-08 LAB — PHOSPHORUS: Phosphorus: 2.5 mg/dL (ref 2.5–4.6)

## 2023-12-08 LAB — VITAMIN B12: Vitamin B-12: 2584 pg/mL — ABNORMAL HIGH (ref 180–914)

## 2023-12-08 LAB — OSMOLALITY, URINE: Osmolality, Ur: 110 mosm/kg — ABNORMAL LOW (ref 300–900)

## 2023-12-08 LAB — TSH: TSH: 0.612 u[IU]/mL (ref 0.350–4.500)

## 2023-12-08 LAB — AMMONIA: Ammonia: 34 umol/L (ref 9–35)

## 2023-12-08 LAB — TROPONIN I (HIGH SENSITIVITY): Troponin I (High Sensitivity): 35 ng/L — ABNORMAL HIGH (ref <18)

## 2023-12-08 MED ORDER — MONTELUKAST SODIUM 10 MG PO TABS
10.0000 mg | ORAL_TABLET | Freq: Every day | ORAL | Status: DC
  Administered 2023-12-08 – 2023-12-09 (×2): 10 mg via ORAL
  Filled 2023-12-08 (×2): qty 1

## 2023-12-08 MED ORDER — ROSUVASTATIN CALCIUM 20 MG PO TABS
40.0000 mg | ORAL_TABLET | Freq: Every day | ORAL | Status: DC
  Administered 2023-12-08 – 2023-12-10 (×3): 40 mg via ORAL
  Filled 2023-12-08 (×3): qty 2

## 2023-12-08 MED ORDER — BENZONATATE 100 MG PO CAPS: 200.0000 mg | ORAL_CAPSULE | Freq: Three times a day (TID) | ORAL | Status: DC | PRN

## 2023-12-08 MED ORDER — SODIUM CHLORIDE 0.9 % IV SOLN
500.0000 mg | Freq: Every day | INTRAVENOUS | Status: DC
  Administered 2023-12-08 (×2): 500 mg via INTRAVENOUS
  Filled 2023-12-08 (×2): qty 5

## 2023-12-08 MED ORDER — METHYLPREDNISOLONE SODIUM SUCC 125 MG IJ SOLR
80.0000 mg | Freq: Two times a day (BID) | INTRAMUSCULAR | Status: DC
  Administered 2023-12-08 – 2023-12-09 (×4): 80 mg via INTRAVENOUS
  Filled 2023-12-08 (×4): qty 2

## 2023-12-08 MED ORDER — LACTATED RINGERS IV BOLUS
1000.0000 mL | Freq: Once | INTRAVENOUS | Status: AC
Start: 2023-12-08 — End: 2023-12-08
  Administered 2023-12-08: 1000 mL via INTRAVENOUS

## 2023-12-08 MED ORDER — ONDANSETRON HCL 4 MG/2ML IJ SOLN: 4.0000 mg | Freq: Four times a day (QID) | INTRAMUSCULAR | Status: DC | PRN

## 2023-12-08 MED ORDER — ACETAMINOPHEN 325 MG PO TABS
650.0000 mg | ORAL_TABLET | Freq: Four times a day (QID) | ORAL | Status: DC | PRN
  Administered 2023-12-10: 650 mg via ORAL
  Filled 2023-12-08: qty 2

## 2023-12-08 MED ORDER — MELATONIN 3 MG PO TABS: 3.0000 mg | ORAL_TABLET | Freq: Every evening | ORAL | Status: DC | PRN

## 2023-12-08 MED ORDER — NICOTINE 14 MG/24HR TD PT24
14.0000 mg | MEDICATED_PATCH | Freq: Every day | TRANSDERMAL | Status: DC
Start: 2023-12-08 — End: 2023-12-10
  Administered 2023-12-08 – 2023-12-10 (×3): 14 mg via TRANSDERMAL
  Filled 2023-12-08 (×3): qty 1

## 2023-12-08 MED ORDER — SODIUM CHLORIDE 0.9 % IV BOLUS
1000.0000 mL | Freq: Once | INTRAVENOUS | Status: AC
  Administered 2023-12-08: 1000 mL via INTRAVENOUS

## 2023-12-08 MED ORDER — SODIUM CHLORIDE 0.9 % IV SOLN
1.0000 g | INTRAVENOUS | Status: DC
  Administered 2023-12-08 – 2023-12-10 (×3): 1 g via INTRAVENOUS
  Filled 2023-12-08 (×3): qty 10

## 2023-12-08 MED ORDER — ACETAMINOPHEN 650 MG RE SUPP: 650.0000 mg | Freq: Four times a day (QID) | RECTAL | Status: DC | PRN

## 2023-12-08 MED ORDER — IPRATROPIUM-ALBUTEROL 0.5-2.5 (3) MG/3ML IN SOLN
3.0000 mL | Freq: Four times a day (QID) | RESPIRATORY_TRACT | Status: DC
  Administered 2023-12-08 – 2023-12-09 (×6): 3 mL via RESPIRATORY_TRACT
  Filled 2023-12-08 (×7): qty 3

## 2023-12-08 MED ORDER — LACTATED RINGERS IV BOLUS
500.0000 mL | Freq: Once | INTRAVENOUS | Status: AC
  Administered 2023-12-08: 500 mL via INTRAVENOUS

## 2023-12-08 MED ORDER — ALBUTEROL SULFATE (2.5 MG/3ML) 0.083% IN NEBU: 2.5000 mg | INHALATION_SOLUTION | RESPIRATORY_TRACT | Status: DC | PRN

## 2023-12-08 NOTE — Progress Notes (Signed)
 PROGRESS NOTE    [name]  HYQ:[id number] DOB: Jan 24, 1946 DOA: 12/07/2023 PCP: [facility]    Brief Narrative:  78 year old gentleman with history of left lower lobe lung cancer persistent, COPD, ongoing smoker, chronic pain syndrome, anemia of chronic disease noted to be very confused at home related to the baseline mental status so EMS was called.  Patient also was reporting excess coughing and occasionally coughing up blood to the neighbor.  In the emergency room afebrile.  Heart rate normal.  Blood pressures initially systolic 80.  83% on room air.  2 L nasal cannula oxygen with oxygen saturation improvement.  Potassium 2.6.  Creatinine 1.75.  Lactic acid was 0.7.  Urine normal.  CT scan abdomen pelvis and chest was done that shows a 5 cm left lower lobe pneumonia bronchogenic carcinoma, patchy opacity right upper lobe concerning for pneumonia.  Patient was given IV fluid boluses, started on IV antibiotics and admitted to the hospital.  Subjective: Patient seen and examined.  Afebrile.  Still pretty sleepy.  He answers basic questions.  He was given another 1 L of bolus early morning hours.  VBG without CO2 retention.  Assessment & Plan:   Acute hypoxemic respiratory failure, severe sepsis secondary to community-acquired pneumonia: Neutrophilic leukocytosis, tachycardia and tachypnea. Possible postobstructive pneumonia.  Agree with admission given severity of symptoms. Antibiotics to treat bacterial pneumonia.  continue Rocephin and azithromycin. Chest physiotherapy, incentive spirometry, deep breathing exercises, sputum induction, mucolytic's and bronchodilators. Sputum cultures, blood cultures, Legionella and streptococcal antigen. Supplemental oxygen to keep saturations more than 90%. With PT OT. Given significant history of COPD, started on high-dose steroids.  Will continue. Patient was resuscitated with isotonic fluid, will continue maintenance fluid  today.  Acute metabolic encephalopathy: Likely secondary to pneumonia.  CT head was essentially normal.  UA was normal.  COVID, flu, RSV were negative.  No focal deficit. Continue delirium precautions.  Fall precautions.  ABG was essentially normal.  Ammonia was normal.  Drug screen positive for opiates.  Patient on MS Contin, Percocet and gabapentin at home. Continue monitoring.  If persistent, may need MRI of the brain.  AKI, clinical dehydration, hypokalemia: Probably secondary to poor oral intake.  Continue maintenance IV fluids.  Continue to monitor potassium.  Check magnesium and phosphorus.  Chronic pain syndrome: Patient on MS Contin, Percocet, gabapentin.  Hold MS Contin.  Will start as needed Percocet and resume gabapentin.  Also resume antidepressants.  Lung cancer with progressive disease: Followed up at [facility].  Looks like he has progressive disease now.  Also complains of intermittent hemoptysis which is likely related to this.  He will [facility].  Will involve palliative care discussions.  Smoker: Not ready to receive counseling.  Will provide nicotine patch.   DVT prophylaxis: SCDs Start: 12/08/23 0005   Code Status: Full code Family Communication: Daughter called and updated on phone Disposition Plan: Status is: Inpatient Remains inpatient appropriate because: Significant home lethargic, IV antibiotics, IV fluids     Consultants:  None  Procedures:  None  Antimicrobials:  Rocephin azithromycin 2/23--     Objective: Vitals:   12/08/23 1100 12/08/23 1215 12/08/23 1216 12/08/23 1330  BP: (!) 98/48 (!) 120/58  106/61  Pulse: (!) 57 61  63  Resp: 14 16  14   Temp:   98.1 F (36.7 C)   TempSrc:   Axillary   SpO2: 99% 100%  100%  Weight:        Intake/Output  Summary (Last 24 hours) at 12/08/2023 1359 Last data filed at 12/08/2023 1007 Gross per 24 hour  Intake 5505.43 ml  Output 1750 ml  Net 3755.43 ml   Filed Weights   12/07/23 2004   Weight: 56 kg    Examination:  General exam: Appears frail and debilitated.  Chronically sick looking. Respiratory system: Clear to auscultation.  mostly conducted upper airway sounds.  Poor inspiratory effort. Cardiovascular system: S1 & S2 heard, RRR.  Gastrointestinal system: Abdomen is nondistended, soft and nontender. No organomegaly or masses felt. Normal bowel sounds heard. Central nervous system: Alert and awake.  Sleepy and lethargic.  Moves all extremities equally.     Data Reviewed: I have personally reviewed following labs and imaging studies  CBC: Recent Labs  Lab 12/07/23 2012 12/07/23 2117 12/08/23 0442  WBC 13.1*  --  7.8  NEUTROABS 11.2*  --  7.2  HGB 10.0* 10.5* 8.3*  HCT 29.5* 31.0* 25.5*  MCV 92.5  --  95.1  PLT 259  --  201   Basic Metabolic Panel: Recent Labs  Lab 12/07/23 2012 12/07/23 2117 12/07/23 2315 12/08/23 0442  NA 130* 131*  --  132*  K 2.6* 2.7*  --  3.6  CL 94* 94*  --  104  CO2 23  --   --  21*  GLUCOSE 129* 110*  --  120*  BUN 24* 25*  --  20  CREATININE 1.75* 1.80*  --  1.51*  CALCIUM 9.7  --   --  8.9  MG  --   --  1.8 1.7  PHOS  --   --   --  2.5   GFR: Estimated Creatinine Clearance: 31.4 mL/min (A) (by C-G formula based on SCr of 1.51 mg/dL (H)). Liver Function Tests: Recent Labs  Lab 12/07/23 2012 12/08/23 0442  AST 55* 42*  ALT 44 33  ALKPHOS 114 85  BILITOT 0.5 0.6  PROT 7.4 5.5*  ALBUMIN 2.6* 2.0*   No results for input(s): "LIPASE", "AMYLASE" in the last 168 hours. Recent Labs  Lab 12/08/23 0442  AMMONIA 34   Coagulation Profile: Recent Labs  Lab 12/07/23 2012  INR 1.1   Cardiac Enzymes: Recent Labs  Lab 12/08/23 0442  CKTOTAL 414*   BNP (last 3 results) No results for input(s): "PROBNP" in the last 8760 hours. HbA1C: No results for input(s): "HGBA1C" in the last 72 hours. CBG: Recent Labs  Lab 12/07/23 2000  GLUCAP 137*   Lipid Profile: No results for input(s): "CHOL", "HDL",  "LDLCALC", "TRIG", "CHOLHDL", "LDLDIRECT" in the last 72 hours. Thyroid Function Tests: Recent Labs    12/08/23 0442  TSH 0.612   Anemia Panel: Recent Labs    12/08/23 0040  VITAMINB12 2,584*   Sepsis Labs: Recent Labs  Lab 12/07/23 2027 12/07/23 2315  PROCALCITON  --  0.52  LATICACIDVEN 0.7  --     Recent Results (from the past 240 hours)  Culture, blood (Routine x 2)     Status: None (Preliminary result)   Collection Time: 12/07/23  8:11 PM   Specimen: BLOOD  Result Value Ref Range Status   Specimen Description BLOOD RIGHT FOREARM  Final   Special Requests   Final    BOTTLES DRAWN AEROBIC AND ANAEROBIC Blood Culture adequate volume   Culture   Final    NO GROWTH < 12 HOURS Performed at [facility], [address], [place], [place] [address]    Report Status PENDING  Incomplete  Culture, blood (Routine x 2)     Status: None (Preliminary result)   Collection Time: 12/07/23  9:10 PM   Specimen: BLOOD RIGHT ARM  Result Value Ref Range Status   Specimen Description BLOOD RIGHT ARM  Final   Special Requests   Final    BOTTLES DRAWN AEROBIC AND ANAEROBIC Blood Culture adequate volume   Culture   Final    NO GROWTH < 12 HOURS Performed at [facility], [address], [place], [place] [address]    Report Status PENDING  Incomplete  Resp panel by RT-PCR (RSV, Flu A&B, Covid) Anterior Nasal Swab     Status: None   Collection Time: 12/07/23  9:10 PM   Specimen: Anterior Nasal Swab  Result Value Ref Range Status   SARS Coronavirus 2 by RT PCR NEGATIVE NEGATIVE Final   Influenza A by PCR NEGATIVE NEGATIVE Final   Influenza B by PCR NEGATIVE NEGATIVE Final    Comment: (NOTE) The Xpert Xpress SARS-CoV-2/FLU/RSV plus assay is intended as an aid in the diagnosis of influenza from Nasopharyngeal swab specimens and should not be used as a sole basis for treatment. Nasal washings and aspirates are unacceptable for Xpert Xpress SARS-CoV-2/FLU/RSV testing.  Fact  Sheet for Patients: BloggerCourse.com  Fact Sheet for Healthcare Providers: SeriousBroker.it  This test is not yet approved or cleared by the [place] FDA and has been authorized for detection and/or diagnosis of SARS-CoV-2 by FDA under an Emergency Use Authorization (EUA). This EUA will remain in effect (meaning this test can be used) for the duration of the COVID-19 declaration under Section 564(b)(1) of the Act, 21 U.S.C. section 360bbb-3(b)(1), unless the authorization is terminated or revoked.     Resp Syncytial Virus by PCR NEGATIVE NEGATIVE Final    Comment: (NOTE) Fact Sheet for Patients: BloggerCourse.com  Fact Sheet for Healthcare Providers: SeriousBroker.it  This test is not yet approved or cleared by the [place] FDA and has been authorized for detection and/or diagnosis of SARS-CoV-2 by FDA under an Emergency Use Authorization (EUA). This EUA will remain in effect (meaning this test can be used) for the duration of the COVID-19 declaration under Section 564(b)(1) of the Act, 21 U.S.C. section 360bbb-3(b)(1), unless the authorization is terminated or revoked.  Performed at [facility], [address], [place], [place] [address]          Radiology Studies: CT CHEST ABDOMEN PELVIS WO CONTRAST Result Date: 12/07/2023 CLINICAL DATA:  Fall, sepsis, history of lung cancer EXAM: CT CHEST, ABDOMEN AND PELVIS WITHOUT CONTRAST TECHNIQUE: Multidetector CT imaging of the chest, abdomen and pelvis was performed following the standard protocol without IV contrast. RADIATION DOSE REDUCTION: This exam was performed according to the departmental dose-optimization program which includes automated exposure control, adjustment of the mA and/or kV according to patient size and/or use of iterative reconstruction technique. COMPARISON:  CTA chest dated 08/20/2023 FINDINGS:  CT CHEST FINDINGS Cardiovascular: The heart is normal in size. No pericardial effusion. Stable mild leftward cardiomediastinal shift. No evidence of thoracic aortic aneurysm. Atherosclerotic calcifications of the aortic arch. Moderate coronary atherosclerosis of the LAD and right coronary artery. Mediastinum/Nodes: Progressive mediastinal lymphadenopathy, including a dominant 2.4 cm subcarinal nodal mass (series 5/image 35), previously 16 mm. Visualized thyroid is unremarkable. Lungs/Pleura: Biapical pleural-parenchymal scarring. Moderate centrilobular and paraseptal emphysematous changes, upper lung predominant. Dominant 5.3 x 4.1 cm left lower lobe mass (series 7/image 108), previously 4.4 x 3.0 cm, likely corresponding to the patient's known primary  bronchogenic carcinoma. Progressive patchy opacity extending inferiorly to the right lung base (series 7/image 128). Progressive patchy opacity with air bronchograms in the medial right upper lobe (series 7/image 49), favoring chronic scarring and/or post treatment changes, although infection/pneumonia is also possible. Numerous subpleural patchy/nodular opacities in the lungs bilaterally, new/progressive from the prior. This appearance is unusual for infection given the subpleural distribution. Regardless, this is of little clinical consequence in this patient. Trace bilateral pleural effusions, stable on the left and new on the right. No pneumothorax. Musculoskeletal: Mild degenerative changes of the lower thoracic spine. No fracture is seen. CT ABDOMEN PELVIS FINDINGS Hepatobiliary: Unenhanced liver is unremarkable. Gallbladder is unremarkable. No intrahepatic or extrahepatic ductal dilatation. Pancreas: Within normal limits. Spleen: Within normal limits. Adrenals/Urinary Tract: Adrenal glands are within normal limits. Kidneys are within normal limits. No renal calculi or hydronephrosis. Mildly thick-walled bladder, although underdistended. Stomach/Bowel: Stomach  is within normal limits. No evidence of bowel obstruction. Appendix is not discretely visualized Left colonic diverticulosis, without evidence of diverticulitis. Vascular/Lymphatic: No evidence of abdominal aortic aneurysm. Atherosclerotic calcifications of the abdominal aorta and branch vessels. No suspicious abdominopelvic lymphadenopathy. Reproductive: Prostate is unremarkable. Other: No abdominopelvic ascites. Musculoskeletal: Status post PLIF at L1-5. Mild degenerative changes. No fracture is seen. IMPRESSION: No evidence of traumatic injury in the chest, abdomen, or pelvis. Dominant 5.3 cm left lower lobe mass, likely corresponding to the patient's known primary bronchogenic carcinoma. Associated progressive thoracic nodal metastases. Progressive patchy opacity with air bronchograms in the medial right upper lobe, favoring chronic scarring and/or post treatment changes, although infection/pneumonia is also possible. No evidence of metastatic disease in the abdomen/pelvis. Additional ancillary findings as above. Aortic Atherosclerosis (ICD10-I70.0) and Emphysema (ICD10-J43.9). Electronically Signed   By: Charline Bills M.D.   On: 12/07/2023 22:59   CT Head Wo Contrast Result Date: 12/07/2023 CLINICAL DATA:  Fall, sepsis EXAM: CT HEAD WITHOUT CONTRAST CT CERVICAL SPINE WITHOUT CONTRAST TECHNIQUE: Multidetector CT imaging of the head and cervical spine was performed following the standard protocol without intravenous contrast. Multiplanar CT image reconstructions of the cervical spine were also generated. RADIATION DOSE REDUCTION: This exam was performed according to the departmental dose-optimization program which includes automated exposure control, adjustment of the mA and/or kV according to patient size and/or use of iterative reconstruction technique. COMPARISON:  MRI brain dated 07/29/2023 FINDINGS: CT HEAD FINDINGS Brain: No evidence of acute infarction, hemorrhage, hydrocephalus, extra-axial  collection or mass lesion/mass effect. Subcortical white matter and periventricular small vessel ischemic changes. Vascular: Intracranial atherosclerosis. Skull: Normal. Negative for fracture or focal lesion. Sinuses/Orbits: The visualized paranasal sinuses are essentially clear. The mastoid air cells are unopacified. Other: None. CT CERVICAL SPINE FINDINGS Alignment: Normal cervical lordosis. Skull base and vertebrae: No acute fracture. No primary bone lesion or focal pathologic process. Soft tissues and spinal canal: No prevertebral fluid or swelling. No visible canal hematoma. Disc levels: Mild degenerative changes at C5-6. Spinal canal is patent. Upper chest: Evaluated on dedicated CT chest. Other: None. IMPRESSION: No acute intracranial abnormality. Small vessel ischemic changes. No traumatic injury to the cervical spine. Electronically Signed   By: Charline Bills M.D.   On: 12/07/2023 22:50   CT Cervical Spine Wo Contrast Result Date: 12/07/2023 CLINICAL DATA:  Fall, sepsis EXAM: CT HEAD WITHOUT CONTRAST CT CERVICAL SPINE WITHOUT CONTRAST TECHNIQUE: Multidetector CT imaging of the head and cervical spine was performed following the standard protocol without intravenous contrast. Multiplanar CT image reconstructions of the cervical spine were also generated. RADIATION DOSE REDUCTION:  This exam was performed according to the departmental dose-optimization program which includes automated exposure control, adjustment of the mA and/or kV according to patient size and/or use of iterative reconstruction technique. COMPARISON:  MRI brain dated 07/29/2023 FINDINGS: CT HEAD FINDINGS Brain: No evidence of acute infarction, hemorrhage, hydrocephalus, extra-axial collection or mass lesion/mass effect. Subcortical white matter and periventricular small vessel ischemic changes. Vascular: Intracranial atherosclerosis. Skull: Normal. Negative for fracture or focal lesion. Sinuses/Orbits: The visualized paranasal sinuses  are essentially clear. The mastoid air cells are unopacified. Other: None. CT CERVICAL SPINE FINDINGS Alignment: Normal cervical lordosis. Skull base and vertebrae: No acute fracture. No primary bone lesion or focal pathologic process. Soft tissues and spinal canal: No prevertebral fluid or swelling. No visible canal hematoma. Disc levels: Mild degenerative changes at C5-6. Spinal canal is patent. Upper chest: Evaluated on dedicated CT chest. Other: None. IMPRESSION: No acute intracranial abnormality. Small vessel ischemic changes. No traumatic injury to the cervical spine. Electronically Signed   By: Charline Bills M.D.   On: 12/07/2023 22:50   DG Chest 2 View Result Date: 12/07/2023 CLINICAL DATA:  Fall, suspected sepsis EXAM: CHEST - 2 VIEW COMPARISON:  08/20/2023 FINDINGS: Mild left basilar scarring/atelectasis with volume loss. Right lung is clear. Small bilateral pleural effusions. No pneumothorax. The heart is normal in size.  Thoracic aortic atherosclerosis. Exaggerated lower thoracic kyphosis. Lumbar spine fixation hardware, incompletely visualized. IMPRESSION: Small bilateral pleural effusions. Electronically Signed   By: Charline Bills M.D.   On: 12/07/2023 20:44   DG Hip Unilat W or Wo Pelvis 2-3 Views Left Result Date: 12/07/2023 CLINICAL DATA:  Fall EXAM: DG HIP (WITH OR WITHOUT PELVIS) 2-3V LEFT COMPARISON:  None Available. FINDINGS: No fracture or dislocation is seen. Bilateral hip joint spaces are preserved. Visualized bony pelvis appears intact. Postsurgical changes involving the lower lumbar spine, incompletely visualized. IMPRESSION: Negative. Electronically Signed   By: Charline Bills M.D.   On: 12/07/2023 20:43        Scheduled Meds:  ipratropium-albuterol  3 mL Nebulization Q6H   lidocaine  1 patch Transdermal Q24H   methylPREDNISolone (SOLU-MEDROL) injection  80 mg Intravenous Q12H   montelukast  10 mg Oral QHS   nicotine  14 mg Transdermal Daily   rosuvastatin  40  mg Oral Daily   Continuous Infusions:  azithromycin Stopped (12/08/23 0156)   cefTRIAXone (ROCEPHIN)  IV Stopped (12/08/23 1007)   lactated ringers 150 mL/hr at 12/08/23 0648     LOS: 1 day    Time spent: 52 minutes    Dorcas Carrow, MD [facility]

## 2023-12-09 DIAGNOSIS — Z7189 Other specified counseling: Secondary | ICD-10-CM | POA: Diagnosis not present

## 2023-12-09 DIAGNOSIS — J189 Pneumonia, unspecified organism: Secondary | ICD-10-CM | POA: Diagnosis present

## 2023-12-09 DIAGNOSIS — Z515 Encounter for palliative care: Secondary | ICD-10-CM

## 2023-12-09 DIAGNOSIS — G9341 Metabolic encephalopathy: Secondary | ICD-10-CM | POA: Diagnosis not present

## 2023-12-09 LAB — COMPREHENSIVE METABOLIC PANEL
ALT: 32 U/L (ref 0–44)
AST: 36 U/L (ref 15–41)
Albumin: 1.9 g/dL — ABNORMAL LOW (ref 3.5–5.0)
Alkaline Phosphatase: 86 U/L (ref 38–126)
Anion gap: 10 (ref 5–15)
BUN: 23 mg/dL (ref 8–23)
CO2: 22 mmol/L (ref 22–32)
Calcium: 8.9 mg/dL (ref 8.9–10.3)
Chloride: 105 mmol/L (ref 98–111)
Creatinine, Ser: 1.16 mg/dL (ref 0.61–1.24)
GFR, Estimated: >60 mL/min (ref >60)
Glucose, Bld: 128 mg/dL — ABNORMAL HIGH (ref 70–99)
Potassium: 3 mmol/L — ABNORMAL LOW (ref 3.5–5.1)
Sodium: 137 mmol/L (ref 135–145)
Total Bilirubin: 0.4 mg/dL (ref 0.0–1.2)
Total Protein: 5.4 g/dL — ABNORMAL LOW (ref 6.5–8.1)

## 2023-12-09 LAB — CBC WITH DIFFERENTIAL/PLATELET
Abs Immature Granulocytes: 0.1 10*3/uL — ABNORMAL HIGH (ref 0.00–0.07)
Basophils Absolute: 0 10*3/uL (ref 0.0–0.1)
Basophils Relative: 0 %
Eosinophils Absolute: 0 10*3/uL (ref 0.0–0.5)
Eosinophils Relative: 0 %
HCT: 27.6 % — ABNORMAL LOW (ref 39.0–52.0)
Hemoglobin: 9 g/dL — ABNORMAL LOW (ref 13.0–17.0)
Immature Granulocytes: 1 %
Lymphocytes Relative: 3 %
Lymphs Abs: 0.4 10*3/uL — ABNORMAL LOW (ref 0.7–4.0)
MCH: 30.9 pg (ref 26.0–34.0)
MCHC: 32.6 g/dL (ref 30.0–36.0)
MCV: 94.8 fL (ref 80.0–100.0)
Monocytes Absolute: 0.4 10*3/uL (ref 0.1–1.0)
Monocytes Relative: 3 %
Neutro Abs: 12.2 10*3/uL — ABNORMAL HIGH (ref 1.7–7.7)
Neutrophils Relative %: 93 %
Platelets: 240 10*3/uL (ref 150–400)
RBC: 2.91 MIL/uL — ABNORMAL LOW (ref 4.22–5.81)
RDW: 13.8 % (ref 11.5–15.5)
WBC: 13.1 10*3/uL — ABNORMAL HIGH (ref 4.0–10.5)
nRBC: 0 % (ref 0.0–0.2)

## 2023-12-09 LAB — MAGNESIUM: Magnesium: 1.8 mg/dL (ref 1.7–2.4)

## 2023-12-09 LAB — PHOSPHORUS: Phosphorus: 3.3 mg/dL (ref 2.5–4.6)

## 2023-12-09 MED ORDER — ASPIRIN 81 MG PO TBEC
81.0000 mg | DELAYED_RELEASE_TABLET | Freq: Every day | ORAL | Status: DC
Start: 2023-12-09 — End: 2023-12-10
  Administered 2023-12-09 – 2023-12-10 (×2): 81 mg via ORAL
  Filled 2023-12-09 (×2): qty 1

## 2023-12-09 MED ORDER — GABAPENTIN 300 MG PO CAPS: 400.0000 mg | ORAL_CAPSULE | Freq: Two times a day (BID) | ORAL | Status: DC

## 2023-12-09 MED ORDER — ESCITALOPRAM OXALATE 20 MG PO TABS
20.0000 mg | ORAL_TABLET | Freq: Every morning | ORAL | Status: DC
  Administered 2023-12-09 – 2023-12-10 (×2): 20 mg via ORAL
  Filled 2023-12-09: qty 2
  Filled 2023-12-09: qty 1

## 2023-12-09 MED ORDER — POTASSIUM CHLORIDE CRYS ER 20 MEQ PO TBCR
40.0000 meq | EXTENDED_RELEASE_TABLET | Freq: Two times a day (BID) | ORAL | Status: DC
  Administered 2023-12-09 – 2023-12-10 (×3): 40 meq via ORAL
  Filled 2023-12-09 (×3): qty 2

## 2023-12-09 MED ORDER — OXYCODONE-ACETAMINOPHEN 5-325 MG PO TABS
1.0000 | ORAL_TABLET | ORAL | Status: DC | PRN
  Administered 2023-12-09: 1 via ORAL
  Filled 2023-12-09: qty 1

## 2023-12-09 MED ORDER — METHYLPREDNISOLONE SODIUM SUCC 40 MG IJ SOLR
40.0000 mg | Freq: Two times a day (BID) | INTRAMUSCULAR | Status: DC
  Administered 2023-12-10: 40 mg via INTRAVENOUS
  Filled 2023-12-09: qty 1

## 2023-12-09 MED ORDER — MORPHINE SULFATE ER 15 MG PO TBCR
15.0000 mg | EXTENDED_RELEASE_TABLET | Freq: Two times a day (BID) | ORAL | Status: DC
  Administered 2023-12-09 – 2023-12-10 (×2): 15 mg via ORAL
  Filled 2023-12-09 (×2): qty 1

## 2023-12-09 MED ORDER — IPRATROPIUM-ALBUTEROL 0.5-2.5 (3) MG/3ML IN SOLN: 3.0000 mL | Freq: Four times a day (QID) | RESPIRATORY_TRACT | Status: DC | PRN

## 2023-12-09 MED ORDER — GABAPENTIN 400 MG PO CAPS
400.0000 mg | ORAL_CAPSULE | Freq: Two times a day (BID) | ORAL | Status: DC
  Administered 2023-12-09 – 2023-12-10 (×4): 400 mg via ORAL
  Filled 2023-12-09: qty 4
  Filled 2023-12-09 (×3): qty 1

## 2023-12-09 MED ORDER — AZITHROMYCIN 500 MG PO TABS
500.0000 mg | ORAL_TABLET | Freq: Every day | ORAL | Status: DC
  Administered 2023-12-09 – 2023-12-10 (×2): 500 mg via ORAL
  Filled 2023-12-09 (×2): qty 1

## 2023-12-09 NOTE — Evaluation (Signed)
 Physical Therapy Evaluation Patient Details Name: [name] MRN: [id number] DOB: 1946-02-06 Today's Date: 12/09/2023  History of Present Illness  Pt is 78 year old presented to [facility] on  12/07/23 for AMS. Found to have acute hypoxic respiratory failure, severe sepsis due to PNA PMH - lung ca, dementia, CAD, htn, opioid pain dependence, depression, hyperlipidemia, COPD, lower GI bleed.  Clinical Impression  Pt admitted with above diagnosis and presents to PT with functional limitations due to deficits listed below (See PT problem list). Pt needs skilled PT to maximize independence and safety. Pt moving fairly well and expect as PNA improves he will be able to mobilize independently and return home from PT standpoint.          If plan is discharge home, recommend the following:     Can travel by private vehicle        Equipment Recommendations None recommended by PT  Recommendations for Other Services       Functional Status Assessment Patient has had a recent decline in their functional status and demonstrates the ability to make significant improvements in function in a reasonable and predictable amount of time.     Precautions / Restrictions Precautions Precautions: Fall Restrictions Weight Bearing Restrictions Per Provider Order: No      Mobility  Bed Mobility Overal bed mobility: Needs Assistance Bed Mobility: Supine to Sit, Sit to Supine     Supine to sit: Supervision Sit to supine: Supervision   General bed mobility comments: Incr time to perform    Transfers Overall transfer level: Needs assistance   Transfers: Sit to/from Stand Sit to Stand: Contact guard assist           General transfer comment: Assist for safety    Ambulation/Gait Ambulation/Gait assistance: Contact guard assist Gait Distance (Feet): 200 Feet Assistive device: None Gait Pattern/deviations: Step-through pattern, Decreased stride length, Drifts right/left Gait velocity:  decr Gait velocity interpretation: 1.31 - 2.62 ft/sec, indicative of limited community ambulator   General Gait Details: Assist for safety. Pt able to self correct minor loss of balance  Stairs            Wheelchair Mobility     Tilt Bed    Modified Rankin (Stroke Patients Only)       Balance Overall balance assessment: Needs assistance Sitting-balance support: No upper extremity supported, Feet supported Sitting balance-Leahy Scale: Normal     Standing balance support: No upper extremity supported, During functional activity Standing balance-Leahy Scale: Good                               Pertinent Vitals/Pain Pain Assessment Pain Assessment: No/denies pain    Home Living Family/patient expects to be discharged to:: Private residence Living Arrangements: Alone Available Help at Discharge: Family;Available PRN/intermittently;Neighbor (neighbor checks on him daily) Type of Home: House Home Access: Stairs to enter Entrance Stairs-Rails: None Entrance Stairs-Number of Steps: 2   Home Layout: Two level;Able to live on main level with bedroom/bathroom Home Equipment: Rolling Walker (2 wheels);Cane - single point      Prior Function Prior Level of Function : Independent/Modified Independent             Mobility Comments: Doesn't use DME       Extremity/Trunk Assessment   Upper Extremity Assessment Upper Extremity Assessment: Defer to OT evaluation    Lower Extremity Assessment Lower Extremity Assessment: Generalized weakness  Communication   Communication Communication: No apparent difficulties    Cognition Arousal: Alert Behavior During Therapy: WFL for tasks assessed/performed   PT - Cognitive impairments: Awareness, Memory                       PT - Cognition Comments: Not quite at his baseline Following commands: Intact       Cueing       General Comments General comments (skin integrity, edema, etc.):  Pt on 2L O2 at rest with SpO2 98%. Removed O2 with SpO2 96% at rest. Amb on RA with SpO2 90%.    Exercises     Assessment/Plan    PT Assessment Patient needs continued PT services  PT Problem List Decreased strength;Decreased activity tolerance;Decreased mobility;Decreased balance       PT Treatment Interventions DME instruction;Gait training;Stair training;Functional mobility training;Therapeutic activities;Balance training;Therapeutic exercise;Patient/family education    PT Goals (Current goals can be found in the Care Plan section)  Acute Rehab PT Goals Patient Stated Goal: return home PT Goal Formulation: With patient Time For Goal Achievement: 12/23/23 Potential to Achieve Goals: Good    Frequency Min 1X/week     Co-evaluation               AM-PAC PT "6 Clicks" Mobility  Outcome Measure Help needed turning from your back to your side while in a flat bed without using bedrails?: None Help needed moving from lying on your back to sitting on the side of a flat bed without using bedrails?: A Little Help needed moving to and from a bed to a chair (including a wheelchair)?: A Little Help needed standing up from a chair using your arms (e.g., wheelchair or bedside chair)?: A Little Help needed to walk in hospital room?: A Little Help needed climbing 3-5 steps with a railing? : A Little 6 Click Score: 19    End of Session   Activity Tolerance: Patient tolerated treatment well Patient left: in bed;with call bell/phone within reach Nurse Communication: Mobility status PT Visit Diagnosis: Muscle weakness (generalized) (M62.81);Other abnormalities of gait and mobility (R26.89)    Time: 0981-1914 PT Time Calculation (min) (ACUTE ONLY): 18 min   Charges:   PT Evaluation $PT Eval Moderate Complexity: 1 Mod   PT General Charges $$ ACUTE PT VISIT: 1 Visit         [facility] PT Acute Rehabilitation Services Office [phone]   Angelina Ok [facility] 12/09/2023,  9:10 AM

## 2023-12-09 NOTE — Progress Notes (Signed)
 PROGRESS NOTE    [name]  ZOX:[id number] DOB: 09/13/1946 DOA: 12/07/2023 PCP: [facility]    Brief Narrative:  78 year old gentleman with history of left lower lobe lung cancer,  COPD, ongoing smoker, chronic pain syndrome, anemia of chronic disease noted to be very confused at home related to the baseline mental status so EMS was called.  Patient also was reporting excess coughing and occasionally coughing up blood. In the emergency room afebrile.  Heart rate normal.  Blood pressures initially systolic 80.  83% on room air.  2 L nasal cannula oxygen with oxygen saturation improvement.  Potassium 2.6.  Creatinine 1.75.  Lactic acid was 0.7.  Urine normal.  CT scan abdomen pelvis and chest was done that shows a 5 cm left lower lobe bronchogenic carcinoma, patchy opacity right upper lobe concerning for pneumonia.  Patient was given IV fluid boluses, started on IV antibiotics and admitted to the hospital.  Subjective:  Patient seen and examined.  Fairly comfortable today.  Alert awake.  Afebrile overnight.  Not mobilized yet.  Talking with palliative care provider.  Assessment & Plan:   Acute hypoxemic respiratory failure, severe sepsis secondary to community-acquired pneumonia: Neutrophilic leukocytosis, tachycardia and tachypnea. Possible postobstructive pneumonia.  Some clinical improvement today.  Remains on Rocephin azithromycin today.  Will continue.  Change azithromycin to oral. Chest physiotherapy, incentive spirometry, deep breathing exercises, sputum induction, mucolytic's and bronchodilators. Sputum cultures, blood cultures, Legionella and streptococcal antigen.  Negative so far. Supplemental oxygen to keep saturations more than 90%. Start working with PT OT. Given significant history of COPD, started on high-dose steroids.   Reduce dose of the steroids, Solu-Medrol to 40 mg twice daily. Patient was resuscitated with isotonic fluid, currently improved.   Discontinue further IV fluids.  Acute metabolic encephalopathy: Likely secondary to pneumonia.  Suspect polypharmacy.  CT head was essentially normal.  UA was normal.  COVID, flu, RSV were negative.  No focal deficit. Continue delirium precautions.  Fall precautions.  ABG was essentially normal.  Ammonia was normal.  Drug screen positive for opiates.  Patient on MS Contin, Percocet and gabapentin at home. Improved.  AKI, clinical dehydration, hypokalemia: Probably secondary to poor oral intake.  Continue to monitor potassium.  Replace potassium today.  Renal functions improving.  Chronic pain syndrome: Patient on MS Contin, Percocet, gabapentin.  Hold MS Contin.  start as needed Percocet and resume gabapentin.  Also resume antidepressants.  Lung cancer with progressive disease: Followed up at [facility].  Looks like he has progressive disease now.  Also complains of intermittent hemoptysis which is likely related to this.  He will [facility].  Palliative care involved.  He would like to continue pursuing treatment and live independent as much as he can.  Smoker: Not ready to receive counseling.  Will provide nicotine patch.   DVT prophylaxis: SCDs Start: 12/08/23 0005   Code Status: Full code Family Communication: None today. Disposition Plan: Status is: Inpatient Remains inpatient appropriate because: Significant home lethargic, IV antibiotics,      Consultants:  Palliative care  Procedures:  None  Antimicrobials:  Rocephin azithromycin 2/23--     Objective: Vitals:   12/09/23 1000 12/09/23 1015 12/09/23 1030 12/09/23 1130  BP: (!) 121/53 (!) 122/58 (!) 123/51 128/66  Pulse: 73 73 70 77  Resp: 18 19 16 19   Temp:    98.2 F (36.8 C)  TempSrc:    Oral  SpO2: 100% 98% 99% 98%  Weight:  Intake/Output Summary (Last 24 hours) at 12/09/2023 1356 Last data filed at 12/09/2023 1013 Gross per 24 hour  Intake 350 ml  Output 600 ml  Net -250 ml   Filed  Weights   12/07/23 2004  Weight: 56 kg    Examination:  General exam: Appears frail and debilitated.  Alert awake and oriented. Respiratory system: Clear to auscultation.  mostly conducted upper airway sounds.  Poor inspiratory effort. Cardiovascular system: S1 & S2 heard, RRR.  Gastrointestinal system: Abdomen is nondistended, soft and nontender. No organomegaly or masses felt. Normal bowel sounds heard. Central nervous system: Alert awake and oriented.  Generalized weakness.  No focal deficits.     Data Reviewed: I have personally reviewed following labs and imaging studies  CBC: Recent Labs  Lab 12/07/23 2012 12/07/23 2117 12/08/23 0442 12/09/23 0451  WBC 13.1*  --  7.8 13.1*  NEUTROABS 11.2*  --  7.2 12.2*  HGB 10.0* 10.5* 8.3* 9.0*  HCT 29.5* 31.0* 25.5* 27.6*  MCV 92.5  --  95.1 94.8  PLT 259  --  201 240   Basic Metabolic Panel: Recent Labs  Lab 12/07/23 2012 12/07/23 2117 12/07/23 2315 12/08/23 0442 12/09/23 0451  NA 130* 131*  --  132* 137  K 2.6* 2.7*  --  3.6 3.0*  CL 94* 94*  --  104 105  CO2 23  --   --  21* 22  GLUCOSE 129* 110*  --  120* 128*  BUN 24* 25*  --  20 23  CREATININE 1.75* 1.80*  --  1.51* 1.16  CALCIUM 9.7  --   --  8.9 8.9  MG  --   --  1.8 1.7 1.8  PHOS  --   --   --  2.5 3.3   GFR: Estimated Creatinine Clearance: 40.9 mL/min (by C-G formula based on SCr of 1.16 mg/dL). Liver Function Tests: Recent Labs  Lab 12/07/23 2012 12/08/23 0442 12/09/23 0451  AST 55* 42* 36  ALT 44 33 32  ALKPHOS 114 85 86  BILITOT 0.5 0.6 0.4  PROT 7.4 5.5* 5.4*  ALBUMIN 2.6* 2.0* 1.9*   No results for input(s): "LIPASE", "AMYLASE" in the last 168 hours. Recent Labs  Lab 12/08/23 0442  AMMONIA 34   Coagulation Profile: Recent Labs  Lab 12/07/23 2012  INR 1.1   Cardiac Enzymes: Recent Labs  Lab 12/08/23 0442  CKTOTAL 414*   BNP (last 3 results) No results for input(s): "PROBNP" in the last 8760 hours. HbA1C: No results for  input(s): "HGBA1C" in the last 72 hours. CBG: Recent Labs  Lab 12/07/23 2000  GLUCAP 137*   Lipid Profile: No results for input(s): "CHOL", "HDL", "LDLCALC", "TRIG", "CHOLHDL", "LDLDIRECT" in the last 72 hours. Thyroid Function Tests: Recent Labs    12/08/23 0442  TSH 0.612   Anemia Panel: Recent Labs    12/08/23 0040  VITAMINB12 2,584*   Sepsis Labs: Recent Labs  Lab 12/07/23 2027 12/07/23 2315  PROCALCITON  --  0.52  LATICACIDVEN 0.7  --     Recent Results (from the past 240 hours)  Culture, blood (Routine x 2)     Status: None (Preliminary result)   Collection Time: 12/07/23  8:11 PM   Specimen: BLOOD  Result Value Ref Range Status   Specimen Description BLOOD RIGHT FOREARM  Final   Special Requests   Final    BOTTLES DRAWN AEROBIC AND ANAEROBIC Blood Culture adequate volume   Culture   Final  NO GROWTH 2 DAYS Performed at [facility], [address], [place], [place] [address]    Report Status PENDING  Incomplete  Culture, blood (Routine x 2)     Status: None (Preliminary result)   Collection Time: 12/07/23  9:10 PM   Specimen: BLOOD RIGHT ARM  Result Value Ref Range Status   Specimen Description BLOOD RIGHT ARM  Final   Special Requests   Final    BOTTLES DRAWN AEROBIC AND ANAEROBIC Blood Culture adequate volume   Culture   Final    NO GROWTH 2 DAYS Performed at [facility], [address], [place], [place] [address]    Report Status PENDING  Incomplete  Resp panel by RT-PCR (RSV, Flu A&B, Covid) Anterior Nasal Swab     Status: None   Collection Time: 12/07/23  9:10 PM   Specimen: Anterior Nasal Swab  Result Value Ref Range Status   SARS Coronavirus 2 by RT PCR NEGATIVE NEGATIVE Final   Influenza A by PCR NEGATIVE NEGATIVE Final   Influenza B by PCR NEGATIVE NEGATIVE Final    Comment: (NOTE) The Xpert Xpress SARS-CoV-2/FLU/RSV plus assay is intended as an aid in the diagnosis of influenza from Nasopharyngeal swab specimens  and should not be used as a sole basis for treatment. Nasal washings and aspirates are unacceptable for Xpert Xpress SARS-CoV-2/FLU/RSV testing.  Fact Sheet for Patients: BloggerCourse.com  Fact Sheet for Healthcare Providers: SeriousBroker.it  This test is not yet approved or cleared by the [place] FDA and has been authorized for detection and/or diagnosis of SARS-CoV-2 by FDA under an Emergency Use Authorization (EUA). This EUA will remain in effect (meaning this test can be used) for the duration of the COVID-19 declaration under Section 564(b)(1) of the Act, 21 U.S.C. section 360bbb-3(b)(1), unless the authorization is terminated or revoked.     Resp Syncytial Virus by PCR NEGATIVE NEGATIVE Final    Comment: (NOTE) Fact Sheet for Patients: BloggerCourse.com  Fact Sheet for Healthcare Providers: SeriousBroker.it  This test is not yet approved or cleared by the [place] FDA and has been authorized for detection and/or diagnosis of SARS-CoV-2 by FDA under an Emergency Use Authorization (EUA). This EUA will remain in effect (meaning this test can be used) for the duration of the COVID-19 declaration under Section 564(b)(1) of the Act, 21 U.S.C. section 360bbb-3(b)(1), unless the authorization is terminated or revoked.  Performed at [facility], [address] [place], [place] [address]          Radiology Studies: ECHOCARDIOGRAM COMPLETE Result Date: 12/08/2023    ECHOCARDIOGRAM REPORT   Patient Name:   [name] Date of Exam: 12/08/2023 Medical Rec #:  [id number]      Height:       68.0 in Accession #:    [id number]     Weight:       123.5 lb Date of Birth:  1946/05/12       BSA:          1.665 m Patient Age:    77 years       BP:           106/61 mmHg Patient Gender: M              HR:           60 bpm. Exam Location:  Inpatient Procedure: 2D Echo,  Color Doppler and Cardiac Doppler (Both Spectral and Color  Flow Doppler were utilized during procedure). Indications:    Elevated Troponin  History:        Patient has prior history of Echocardiogram examinations, most                 recent 06/09/2023. CAD, COPD; Risk Factors:Hypertension and                 Diabetes.  Sonographer:    Harriette Bouillon RDCS Referring Phys: [id number] JUSTIN B HOWERTER IMPRESSIONS  1. Left ventricular ejection fraction, by estimation, is 60 to 65%. The left ventricle has normal function. The left ventricle has no regional wall motion abnormalities. Left ventricular diastolic parameters are consistent with Grade I diastolic dysfunction (impaired relaxation).  2. Right ventricular systolic function is normal. The right ventricular size is normal. There is mildly elevated pulmonary artery systolic pressure.  3. The mitral valve is normal in structure. Trivial mitral valve regurgitation. No evidence of mitral stenosis.  4. Yhe non-coronary cusp is markedly calcied and appears immobile. The aortic valve is tricuspid. There is moderate calcification of the aortic valve. Aortic valve regurgitation is not visualized. No aortic stenosis is present.  5. The inferior vena cava is dilated in size with >50% respiratory variability, suggesting right atrial pressure of 8 mmHg. FINDINGS  Left Ventricle: Left ventricular ejection fraction, by estimation, is 60 to 65%. The left ventricle has normal function. The left ventricle has no regional wall motion abnormalities. Strain imaging was not performed. The left ventricular internal cavity  size was normal in size. There is no left ventricular hypertrophy. Left ventricular diastolic parameters are consistent with Grade I diastolic dysfunction (impaired relaxation). Right Ventricle: The right ventricular size is normal. No increase in right ventricular wall thickness. Right ventricular systolic function is normal. There is mildly elevated pulmonary  artery systolic pressure. The tricuspid regurgitant velocity is 2.70  m/s, and with an assumed right atrial pressure of 8 mmHg, the estimated right ventricular systolic pressure is 37.2 mmHg. Left Atrium: Left atrial size was normal in size. Right Atrium: Right atrial size was normal in size. Pericardium: There is no evidence of pericardial effusion. Mitral Valve: The mitral valve is normal in structure. Trivial mitral valve regurgitation. No evidence of mitral valve stenosis. Tricuspid Valve: The tricuspid valve is normal in structure. Tricuspid valve regurgitation is trivial. No evidence of tricuspid stenosis. Aortic Valve: Yhe non-coronary cusp is markedly calcied and appears immobile. The aortic valve is tricuspid. There is moderate calcification of the aortic valve. Aortic valve regurgitation is not visualized. No aortic stenosis is present. Pulmonic Valve: The pulmonic valve was normal in structure. Pulmonic valve regurgitation is not visualized. No evidence of pulmonic stenosis. Aorta: The aortic root is normal in size and structure. Venous: The inferior vena cava is dilated in size with greater than 50% respiratory variability, suggesting right atrial pressure of 8 mmHg. IAS/Shunts: No atrial level shunt detected by color flow Doppler. Additional Comments: 3D imaging was not performed.  LEFT VENTRICLE PLAX 2D LVIDd:         4.00 cm   Diastology LVIDs:         2.50 cm   LV e' medial:    9.14 cm/s LV PW:         1.00 cm   LV E/e' medial:  9.9 LV IVS:        1.00 cm   LV e' lateral:   10.00 cm/s LVOT diam:     2.00 cm   LV E/e'  lateral: 9.1 LV SV:         70 LV SV Index:   42 LVOT Area:     3.14 cm  RIGHT VENTRICLE             IVC RV S prime:     17.70 cm/s  IVC diam: 2.50 cm TAPSE (M-mode): 2.0 cm LEFT ATRIUM             Index        RIGHT ATRIUM           Index LA Vol (A2C):   47.7 ml 28.65 ml/m  RA Area:     14.50 cm LA Vol (A4C):   37.2 ml 22.34 ml/m  RA Volume:   31.60 ml  18.98 ml/m LA Biplane Vol:  42.4 ml 25.47 ml/m  AORTIC VALVE LVOT Vmax:   78.30 cm/s LVOT Vmean:  51.000 cm/s LVOT VTI:    0.222 m  AORTA Ao Root diam: 3.00 cm Ao Asc diam:  3.30 cm MITRAL VALVE                TRICUSPID VALVE MV Area (PHT): 3.85 cm     TR Peak grad:   29.2 mmHg MV Decel Time: 197 msec     TR Vmax:        270.00 cm/s MV E velocity: 90.80 cm/s MV A velocity: 102.00 cm/s  SHUNTS MV E/A ratio:  0.89         Systemic VTI:  0.22 m                             Systemic Diam: 2.00 cm Arvilla Meres MD Electronically signed by Arvilla Meres MD Signature Date/Time: 12/08/2023/3:45:56 PM    Final    CT CHEST ABDOMEN PELVIS WO CONTRAST Result Date: 12/07/2023 CLINICAL DATA:  Fall, sepsis, history of lung cancer EXAM: CT CHEST, ABDOMEN AND PELVIS WITHOUT CONTRAST TECHNIQUE: Multidetector CT imaging of the chest, abdomen and pelvis was performed following the standard protocol without IV contrast. RADIATION DOSE REDUCTION: This exam was performed according to the departmental dose-optimization program which includes automated exposure control, adjustment of the mA and/or kV according to patient size and/or use of iterative reconstruction technique. COMPARISON:  CTA chest dated 08/20/2023 FINDINGS: CT CHEST FINDINGS Cardiovascular: The heart is normal in size. No pericardial effusion. Stable mild leftward cardiomediastinal shift. No evidence of thoracic aortic aneurysm. Atherosclerotic calcifications of the aortic arch. Moderate coronary atherosclerosis of the LAD and right coronary artery. Mediastinum/Nodes: Progressive mediastinal lymphadenopathy, including a dominant 2.4 cm subcarinal nodal mass (series 5/image 35), previously 16 mm. Visualized thyroid is unremarkable. Lungs/Pleura: Biapical pleural-parenchymal scarring. Moderate centrilobular and paraseptal emphysematous changes, upper lung predominant. Dominant 5.3 x 4.1 cm left lower lobe mass (series 7/image 108), previously 4.4 x 3.0 cm, likely corresponding to the patient's  known primary bronchogenic carcinoma. Progressive patchy opacity extending inferiorly to the right lung base (series 7/image 128). Progressive patchy opacity with air bronchograms in the medial right upper lobe (series 7/image 49), favoring chronic scarring and/or post treatment changes, although infection/pneumonia is also possible. Numerous subpleural patchy/nodular opacities in the lungs bilaterally, new/progressive from the prior. This appearance is unusual for infection given the subpleural distribution. Regardless, this is of little clinical consequence in this patient. Trace bilateral pleural effusions, stable on the left and new on the right. No pneumothorax. Musculoskeletal: Mild degenerative changes of the lower thoracic spine. No fracture is seen.  CT ABDOMEN PELVIS FINDINGS Hepatobiliary: Unenhanced liver is unremarkable. Gallbladder is unremarkable. No intrahepatic or extrahepatic ductal dilatation. Pancreas: Within normal limits. Spleen: Within normal limits. Adrenals/Urinary Tract: Adrenal glands are within normal limits. Kidneys are within normal limits. No renal calculi or hydronephrosis. Mildly thick-walled bladder, although underdistended. Stomach/Bowel: Stomach is within normal limits. No evidence of bowel obstruction. Appendix is not discretely visualized Left colonic diverticulosis, without evidence of diverticulitis. Vascular/Lymphatic: No evidence of abdominal aortic aneurysm. Atherosclerotic calcifications of the abdominal aorta and branch vessels. No suspicious abdominopelvic lymphadenopathy. Reproductive: Prostate is unremarkable. Other: No abdominopelvic ascites. Musculoskeletal: Status post PLIF at L1-5. Mild degenerative changes. No fracture is seen. IMPRESSION: No evidence of traumatic injury in the chest, abdomen, or pelvis. Dominant 5.3 cm left lower lobe mass, likely corresponding to the patient's known primary bronchogenic carcinoma. Associated progressive thoracic nodal metastases.  Progressive patchy opacity with air bronchograms in the medial right upper lobe, favoring chronic scarring and/or post treatment changes, although infection/pneumonia is also possible. No evidence of metastatic disease in the abdomen/pelvis. Additional ancillary findings as above. Aortic Atherosclerosis (ICD10-I70.0) and Emphysema (ICD10-J43.9). Electronically Signed   By: Charline Bills M.D.   On: 12/07/2023 22:59   CT Head Wo Contrast Result Date: 12/07/2023 CLINICAL DATA:  Fall, sepsis EXAM: CT HEAD WITHOUT CONTRAST CT CERVICAL SPINE WITHOUT CONTRAST TECHNIQUE: Multidetector CT imaging of the head and cervical spine was performed following the standard protocol without intravenous contrast. Multiplanar CT image reconstructions of the cervical spine were also generated. RADIATION DOSE REDUCTION: This exam was performed according to the departmental dose-optimization program which includes automated exposure control, adjustment of the mA and/or kV according to patient size and/or use of iterative reconstruction technique. COMPARISON:  MRI brain dated 07/29/2023 FINDINGS: CT HEAD FINDINGS Brain: No evidence of acute infarction, hemorrhage, hydrocephalus, extra-axial collection or mass lesion/mass effect. Subcortical white matter and periventricular small vessel ischemic changes. Vascular: Intracranial atherosclerosis. Skull: Normal. Negative for fracture or focal lesion. Sinuses/Orbits: The visualized paranasal sinuses are essentially clear. The mastoid air cells are unopacified. Other: None. CT CERVICAL SPINE FINDINGS Alignment: Normal cervical lordosis. Skull base and vertebrae: No acute fracture. No primary bone lesion or focal pathologic process. Soft tissues and spinal canal: No prevertebral fluid or swelling. No visible canal hematoma. Disc levels: Mild degenerative changes at C5-6. Spinal canal is patent. Upper chest: Evaluated on dedicated CT chest. Other: None. IMPRESSION: No acute intracranial  abnormality. Small vessel ischemic changes. No traumatic injury to the cervical spine. Electronically Signed   By: Charline Bills M.D.   On: 12/07/2023 22:50   CT Cervical Spine Wo Contrast Result Date: 12/07/2023 CLINICAL DATA:  Fall, sepsis EXAM: CT HEAD WITHOUT CONTRAST CT CERVICAL SPINE WITHOUT CONTRAST TECHNIQUE: Multidetector CT imaging of the head and cervical spine was performed following the standard protocol without intravenous contrast. Multiplanar CT image reconstructions of the cervical spine were also generated. RADIATION DOSE REDUCTION: This exam was performed according to the departmental dose-optimization program which includes automated exposure control, adjustment of the mA and/or kV according to patient size and/or use of iterative reconstruction technique. COMPARISON:  MRI brain dated 07/29/2023 FINDINGS: CT HEAD FINDINGS Brain: No evidence of acute infarction, hemorrhage, hydrocephalus, extra-axial collection or mass lesion/mass effect. Subcortical white matter and periventricular small vessel ischemic changes. Vascular: Intracranial atherosclerosis. Skull: Normal. Negative for fracture or focal lesion. Sinuses/Orbits: The visualized paranasal sinuses are essentially clear. The mastoid air cells are unopacified. Other: None. CT CERVICAL SPINE FINDINGS Alignment: Normal cervical lordosis. Skull base and  vertebrae: No acute fracture. No primary bone lesion or focal pathologic process. Soft tissues and spinal canal: No prevertebral fluid or swelling. No visible canal hematoma. Disc levels: Mild degenerative changes at C5-6. Spinal canal is patent. Upper chest: Evaluated on dedicated CT chest. Other: None. IMPRESSION: No acute intracranial abnormality. Small vessel ischemic changes. No traumatic injury to the cervical spine. Electronically Signed   By: Charline Bills M.D.   On: 12/07/2023 22:50   DG Chest 2 View Result Date: 12/07/2023 CLINICAL DATA:  Fall, suspected sepsis EXAM: CHEST  - 2 VIEW COMPARISON:  08/20/2023 FINDINGS: Mild left basilar scarring/atelectasis with volume loss. Right lung is clear. Small bilateral pleural effusions. No pneumothorax. The heart is normal in size.  Thoracic aortic atherosclerosis. Exaggerated lower thoracic kyphosis. Lumbar spine fixation hardware, incompletely visualized. IMPRESSION: Small bilateral pleural effusions. Electronically Signed   By: Charline Bills M.D.   On: 12/07/2023 20:44   DG Hip Unilat W or Wo Pelvis 2-3 Views Left Result Date: 12/07/2023 CLINICAL DATA:  Fall EXAM: DG HIP (WITH OR WITHOUT PELVIS) 2-3V LEFT COMPARISON:  None Available. FINDINGS: No fracture or dislocation is seen. Bilateral hip joint spaces are preserved. Visualized bony pelvis appears intact. Postsurgical changes involving the lower lumbar spine, incompletely visualized. IMPRESSION: Negative. Electronically Signed   By: Charline Bills M.D.   On: 12/07/2023 20:43        Scheduled Meds:  aspirin EC  81 mg Oral Daily   azithromycin  500 mg Oral Daily   escitalopram  20 mg Oral q AM   gabapentin  400 mg Oral BID   ipratropium-albuterol  3 mL Nebulization Q6H   lidocaine  1 patch Transdermal Q24H   [START ON 12/10/2023] methylPREDNISolone (SOLU-MEDROL) injection  40 mg Intravenous Q12H   montelukast  10 mg Oral QHS   nicotine  14 mg Transdermal Daily   potassium chloride  40 mEq Oral BID   rosuvastatin  40 mg Oral Daily   Continuous Infusions:  cefTRIAXone (ROCEPHIN)  IV Stopped (12/09/23 1013)     LOS: 2 days    Time spent: 40 minutes    Dorcas Carrow, MD [facility]

## 2023-12-09 NOTE — Evaluation (Signed)
 Occupational Therapy Evaluation Patient Details Name: [name] MRN: [id number] DOB: 1946-04-01 Today's Date: 12/09/2023   History of Present Illness   Pt is 78 year old presented to [facility] on  12/07/23 for AMS. Found to have acute hypoxic respiratory failure, severe sepsis due to PNA PMH - lung ca, dementia, CAD, htn, opioid pain dependence, depression, hyperlipidemia, COPD, lower GI bleed.     Clinical Impressions Patient admitted for the diagnosis above.  At home he lives alone, but has a Network engineer and daughter that assist PRN.  Patient states despite chronic low back pain, he remains independent with ADL,iADL and mobility.  Currently he is a little unsteady with decreased activity tolerance, but is supervision to CGA for ADL completion and in room mobility.  OT will continue efforts in the acute setting to address deficits, and no post acute OT is anticipated.       If plan is discharge home, recommend the following:   Assist for transportation     Functional Status Assessment   Patient has had a recent decline in their functional status and demonstrates the ability to make significant improvements in function in a reasonable and predictable amount of time.     Equipment Recommendations   None recommended by OT     Recommendations for Other Services         Precautions/Restrictions   Precautions Precautions: Fall Recall of Precautions/Restrictions: Intact Restrictions Weight Bearing Restrictions Per Provider Order: No     Mobility Bed Mobility Overal bed mobility: Modified Independent               Patient Response: Cooperative  Transfers Overall transfer level: Needs assistance   Transfers: Sit to/from Stand Sit to Stand: Contact guard assist                  Balance Overall balance assessment: Mild deficits observed, not formally tested                                         ADL either performed or assessed with  clinical judgement   ADL       Grooming: Wash/dry hands;Wash/dry face;Supervision/safety;Standing               Lower Body Dressing: Contact guard assist;Sit to/from stand   Toilet Transfer: Contact guard Optician, dispensing                   Vision Patient Visual Report: No change from baseline       Perception Perception: Not tested       Praxis Praxis: Not tested       Pertinent Vitals/Pain Pain Assessment Pain Assessment: Faces Faces Pain Scale: Hurts even more Pain Location: Chronic low back pain Pain Descriptors / Indicators: Grimacing, Constant Pain Intervention(s): Monitored during session     Extremity/Trunk Assessment Upper Extremity Assessment Upper Extremity Assessment: Overall WFL for tasks assessed   Lower Extremity Assessment Lower Extremity Assessment: Defer to PT evaluation   Cervical / Trunk Assessment Cervical / Trunk Assessment: Kyphotic   Communication Communication Communication: No apparent difficulties   Cognition Arousal: Alert Behavior During Therapy: WFL for tasks assessed/performed Cognition: No apparent impairments                               Following commands: Intact  Cueing  General Comments   Cueing Techniques: Verbal cues   Desat's on RA   Exercises     Shoulder Instructions      Home Living Family/patient expects to be discharged to:: Private residence Living Arrangements: Alone Available Help at Discharge: Family;Available PRN/intermittently;Neighbor Type of Home: House Home Access: Stairs to enter [employer] of Steps: 2 Entrance Stairs-Rails: None Home Layout: Two level;Able to live on main level with bedroom/bathroom     Bathroom Shower/Tub: Chief Strategy Officer: Standard Bathroom Accessibility: Yes How Accessible: Accessible via walker Home Equipment: Rolling Walker (2 wheels);Cane - single point   Additional Comments: Pt  lives alone, has daughter nearby who can assist occasionally.      Prior Functioning/Environment Prior Level of Function : Independent/Modified Independent             Mobility Comments: Doesn't use DME ADLs Comments: independent    OT Problem List: Decreased activity tolerance;Impaired balance (sitting and/or standing)   OT Treatment/Interventions: Self-care/ADL training;Therapeutic activities;Balance training;DME and/or AE instruction;Patient/family education      OT Goals(Current goals can be found in the care plan section)   Acute Rehab OT Goals Patient Stated Goal: Return home OT Goal Formulation: With patient Time For Goal Achievement: 12/23/23 Potential to Achieve Goals: Good ADL Goals Pt Will Perform Grooming: Independently;standing Pt Will Perform Lower Body Dressing: Independently;sit to/from stand Pt Will Transfer to Toilet: Independently;ambulating;regular height toilet   OT Frequency:  Min 1X/week    Co-evaluation              AM-PAC OT "6 Clicks" Daily Activity     Outcome Measure Help from another person eating meals?: None Help from another person taking care of personal grooming?: A Little Help from another person toileting, which includes using toliet, bedpan, or urinal?: A Little Help from another person bathing (including washing, rinsing, drying)?: A Little Help from another person to put on and taking off regular upper body clothing?: None Help from another person to put on and taking off regular lower body clothing?: A Little 6 Click Score: 20   End of Session Equipment Utilized During Treatment: Oxygen Nurse Communication: Mobility status  Activity Tolerance: Patient tolerated treatment well Patient left: in chair;with call bell/phone within reach;with chair alarm set  OT Visit Diagnosis: Unsteadiness on feet (R26.81)                Time: 4098-1191 OT Time Calculation (min): 25 min Charges:  OT General Charges $OT Visit: 1  Visit OT Evaluation $OT Eval Moderate Complexity: 1 Mod OT Treatments $Self Care/Home Management : 8-22 mins  12/09/2023  RP, OTR/L  Acute Rehabilitation Services  Office:  [phone]   [name] 12/09/2023, 1:51 PM

## 2023-12-09 NOTE — Consult Note (Signed)
 Palliative Medicine Inpatient Consult Note  Consulting Provider: Dr. Jerral Ralph  Reason for consult:   Palliative Care Consult Services Palliative Medicine Consult  Reason for Consult? goal of care   12/09/2023  HPI:  Per intake H&P -->   78 year old gentleman with history of left lower lobe lung cancer persistent, COPD, ongoing smoker, chronic pain syndrome, anemia of chronic disease noted to be very confused at home related to the baseline mental status so EMS was called. Patient also was reporting excess coughing and occasionally coughing up blood to the neighbor.   The Palliative Care team has been asked to have goals of care conversations.   Clinical Assessment/Goals of Care:  *Please note that this is a verbal dictation therefore any spelling or grammatical errors are due to the "Dragon Medical One" system interpretation.  I have reviewed medical records including EPIC notes, labs and imaging, received report from bedside RN, assessed the patient who is lying in the gurney in NAD.    I met with Saverio Handler to further discuss diagnosis prognosis, GOC, EOL wishes, disposition and options.   I introduced Palliative Medicine as specialized medical care for people living with serious illness. It focuses on providing relief from the symptoms and stress of a serious illness. The goal is to improve quality of life for both the patient and the family.  Medical History Review and Understanding:  Randee has a history of COPD, chronic pain, anemia, & left lower lobe lung cancer.  Social History:  Fayrene Fearing shares that he lives in [place].  He has been married 3 times his most recent marriage lasted 25 years prior to divorce.  Shares he has 1 daughter from his first marriage and 2 daughters from his second marriage.  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  He is a man of the [place] faith.  Functional and  Nutritional State:  Preceding hospitalization Carney Bern was living in a single-family home with his daughters helping him with things such as meal preparation.  Advance Directives:  A detailed discussion was had today regarding advanced directives.  Patient shares he has not made advanced directives though would be interested in doing so while here.  Code Status:  Concepts specific to code status, artifical feeding and hydration, continued IV antibiotics and rehospitalization was had.  The difference between a aggressive medical intervention path  and a palliative comfort care path for this patient at this time was had.   Encouraged patient/family to consider DNR/DNI status understanding evidenced based poor outcomes in similar hospitalized patient, as the cause of arrest is likely associated with advanced chronic/terminal illness rather than an easily reversible acute cardio-pulmonary event. I explained that DNR/DNI does not change the medical plan and it only comes into effect after a person has arrested (died).  It is a protective measure to keep [place] from harming the patient in their last moments of life.   Provided  "Hard Choices for [employer]" booklet.   Discussion:  I spoke with Laiken about his reason for hospitalization inclusive of his delirium, sepsis, and identified community-acquired pneumonia. Javyon and I discussed his cancer and how he has received treatment at the [place] in [place].  We reviewed best case and worst-case scenarios moving forward should his cancer continue to worsen.  Chrisean states that his hope is to continue living as he has been living which has been for the most part fairly independent.  His daughter Marcelino Duster lives about 5 minutes away and he sees his daughter Alinda Money incrementally as she helps prepare his meals sometimes.  Elie states that goals moving forward are's 78th birthday whereby he and his family have planned a pretty significant celebration.  Emphasized the  importance of creating an advance directive and reviewing what patient would and would not like in the future.  Emphasized the importance of considering what quality of life is to Andrw and reflecting with his family on this.  Discussed the importance of continued conversation with family and their  medical providers regarding overall plan of care and treatment options, ensuring decisions are within the context of the patients values and GOCs. ________________ Addendum:  I spoke to Rodel's daughter Marcelino Duster over the phone.  She and I discussed the above.  Marcelino Duster has various concerns regarding patient's financial situation and debt.  She is most worried about his relationship with his daughter Alinda Money who apparently has a drug addiction and tends to use Yamin as she see if it is. Yedidya is still financially supporting her.  Marcelino Duster agrees that advance directives are very important for [place]ak.   We have agreed to talk tomorrow on speaker phone to review these together.  Decision Maker: Tomaso,Vicki Pasqual (Daughter): [phone] (Mobile)   SUMMARY OF RECOMMENDATIONS   Full Code - Kylin is considering this further given the burden that resuscitation would cause to his body and the lack of true benefit  Reviewed the importance of advanced care planning  Allowing time for outcomes  Patient's goals are to make it to his 78th birthday which is in April of this year  Discussed possible progression of lung cancer and what that would mean in the long-term  Ongoing palliative care support  Code Status/Advance Care Planning: FULL CODE  Palliative Prophylaxis:  Aspiration, Bowel Regimen, Delirium Protocol, Frequent Pain Assessment, Oral Care, Palliative Wound Care, and Turn Reposition  Additional Recommendations (Limitations, Scope, Preferences): Continue current support  Psycho-social/Spiritual:  Desire for further Chaplaincy support: Patient is a Engineering geologist Recommendations: Education on  chronic disease    Prognosis: Patient with chronic disease burden and recurrent readmissions placing him at a higher 36-month mortality risk  Discharge Planning: Discharge home once medically optimized.  Vitals:   12/09/23 0616 12/09/23 0800  BP:  136/65  Pulse: 70 80  Resp: 15 17  Temp:  97.7 F (36.5 C)  SpO2: 100% 100%    Intake/Output Summary (Last 24 hours) at 12/09/2023 1002 Last data filed at 12/09/2023 0244 Gross per 24 hour  Intake 350 ml  Output 600 ml  Net -250 ml   Last Weight  Most recent update: 12/07/2023  8:04 PM    Weight  56 kg (123 lb 7.3 oz)            Gen: Elderly Caucasian male in no acute distress HEENT: moist mucous membranes CV: Regular rate and rhythm  PULM: Breathing regularly on 2 L nasal cannula ABD: soft/nontender  EXT: No edema  Neuro: Alert and oriented x3   PPS: 60%   This conversation/these recommendations were discussed with patient primary care team, Dr. Jerral Ralph  Billing based on MDM: High  Problems Addressed: One or more chronic illnesses with severe exacerbation, progression, or side effects of treatment. and One acute or chronic illness or injury that poses a threat to life or bodily function  Amount and/or Complexity of Data: Category 2:Independent interpretation of a test performed by another physician/other qualified health care professional (not separately reported) and  Category 3:Discussion of management or test interpretation with external physician/other qualified health care professional/appropriate source (not separately reported)  Risks: Decision regarding hospitalization or escalation of hospital care and Decision not to resuscitate or to de-escalate care because of poor prognosis ______________________________________________________ Lamarr Lulas [facility] Palliative Medicine Team Team Cell Phone: [phone] Please utilize secure chat with additional questions, if there is no response within 30 minutes  please call the above phone number  Palliative Medicine Team providers are available by phone from 7am to 7pm daily and can be reached through the team cell phone.  Should this patient require assistance outside of these hours, please call the patient's attending physician.

## 2023-12-09 NOTE — Plan of Care (Signed)

## 2023-12-09 NOTE — ED Notes (Signed)
 RN x 1 assist to bedside commode. Pt able to follow commands and take short steps. Bed linen changed and warm blanket provided. Pt encouraged to eat breakfast.

## 2023-12-10 ENCOUNTER — Other Ambulatory Visit (HOSPITAL_COMMUNITY): Payer: Self-pay

## 2023-12-10 DIAGNOSIS — Z515 Encounter for palliative care: Secondary | ICD-10-CM | POA: Diagnosis not present

## 2023-12-10 DIAGNOSIS — Z7189 Other specified counseling: Secondary | ICD-10-CM | POA: Diagnosis not present

## 2023-12-10 DIAGNOSIS — G9341 Metabolic encephalopathy: Secondary | ICD-10-CM | POA: Diagnosis not present

## 2023-12-10 LAB — BASIC METABOLIC PANEL
Anion gap: 10 (ref 5–15)
BUN: 29 mg/dL — ABNORMAL HIGH (ref 8–23)
CO2: 24 mmol/L (ref 22–32)
Calcium: 9.8 mg/dL (ref 8.9–10.3)
Chloride: 104 mmol/L (ref 98–111)
Creatinine, Ser: 1.04 mg/dL (ref 0.61–1.24)
GFR, Estimated: >60 mL/min (ref >60)
Glucose, Bld: 154 mg/dL — ABNORMAL HIGH (ref 70–99)
Potassium: 3.7 mmol/L (ref 3.5–5.1)
Sodium: 138 mmol/L (ref 135–145)

## 2023-12-10 MED ORDER — AMLODIPINE BESYLATE 10 MG PO TABS
10.0000 mg | ORAL_TABLET | Freq: Every day | ORAL | 0 refills | Status: DC
  Filled 2023-12-10: qty 30, 30d supply, fill #0

## 2023-12-10 MED ORDER — CYANOCOBALAMIN 1000 MCG PO TABS
500.0000 ug | ORAL_TABLET | Freq: Two times a day (BID) | ORAL | 0 refills | Status: DC
  Filled 2023-12-10: qty 15, 15d supply, fill #0

## 2023-12-10 MED ORDER — AZITHROMYCIN 500 MG PO TABS
500.0000 mg | ORAL_TABLET | Freq: Every day | ORAL | 0 refills | Status: DC
  Filled 2023-12-10: qty 3, 3d supply, fill #0

## 2023-12-10 MED ORDER — AMLODIPINE BESYLATE 10 MG PO TABS
10.0000 mg | ORAL_TABLET | Freq: Every day | ORAL | Status: DC
Start: 2023-12-10 — End: 2023-12-10
  Administered 2023-12-10: 10 mg via ORAL
  Filled 2023-12-10: qty 1

## 2023-12-10 MED ORDER — ACETAMINOPHEN 500 MG PO TABS
500.0000 mg | ORAL_TABLET | Freq: Three times a day (TID) | ORAL | 0 refills | Status: DC | PRN
  Filled 2023-12-10: qty 10, 4d supply, fill #0

## 2023-12-10 MED ORDER — PREDNISONE 5 MG PO TABS
ORAL_TABLET | ORAL | 0 refills | Status: AC
  Filled 2023-12-10: qty 65, 18d supply, fill #0

## 2023-12-10 MED ORDER — ASPIRIN 81 MG PO TBEC
81.0000 mg | DELAYED_RELEASE_TABLET | Freq: Every day | ORAL | 12 refills | Status: DC
  Filled 2023-12-10: qty 30, 30d supply, fill #0

## 2023-12-10 NOTE — Discharge Summary (Addendum)
 [name] ZOX:[id number] DOB: 1946/01/30 DOA: 12/07/2023  PCP: [facility]  Admit date: 12/07/2023  Discharge date: 12/10/2023  Admitted From: Home   Disposition:  Home   Recommendations for Outpatient Follow-up:   Follow up with PCP in 1-2 weeks  PCP Please obtain BMP/CBC, 2 view CXR in 1week,  (see Discharge instructions)   PCP Please follow up on the following pending results:    Home Health: None   Equipment/Devices: None  Consultations: None  Discharge Condition: Stable    CODE STATUS: Full    Diet Recommendation: Heart Healthy    Chief Complaint  Patient presents with   Weakness     Brief history of present illness from the day of admission and additional interim summary    78 year old gentleman with history of left lower lobe lung cancer,  COPD, ongoing smoker, chronic pain syndrome, anemia of chronic disease noted to be very confused at home related to the baseline mental status so EMS was called.  Patient also was reporting excess coughing and occasionally coughing up blood. In the emergency room afebrile.  Heart rate normal.  Blood pressures initially systolic 80.  83% on room air.  2 L nasal cannula oxygen with oxygen saturation improvement.  Potassium 2.6.  Creatinine 1.75.  Lactic acid was 0.7.  Urine normal.  CT scan abdomen pelvis and chest was done that shows a 5 cm left lower lobe bronchogenic carcinoma, patchy opacity right upper lobe concerning for pneumonia.  Patient was given IV fluid boluses, started on IV antibiotics and admitted to the hospital.                                                                    Hospital Course   Acute hypoxemic respiratory failure, severe sepsis secondary to community-acquired pneumonia: Neutrophilic leukocytosis, tachycardia and  tachypnea.  Treated with IV antibiotics along with I-S and flutter valve for chest PT and pulmonary toiletry, he also received some Mucomyst for sputum induction and removal, initially required oxygen but currently on room air.  Now symptom-free and back to baseline.  All cultures thus far negative.  He is not requiring any oxygen and is symptom-free, eager to go home, will be placed on oral antibiotics for 3 more days and discharged home with outpatient follow-up with his PCP.  He does have underlying lung cancer and has been requested to follow-up with his pulmonary physician within a week of discharge.    History of COPD with ongoing smoking.  Counseled to quit.  Does not want NicoDerm patch, short course of steroid taper, currently no wheezing.   Acute metabolic encephalopathy: Likely secondary to pneumonia.  Suspect polypharmacy.  CT head was essentially normal.  UA was normal.  COVID, flu, RSV were negative.  No focal  deficit.  No headache.  Back to baseline this problem has resolved.  Unseld not to overuse his narcotics and sedating medications.     AKI.  Improved after hydration.    Hypokalemia replaced.   Chronic pain syndrome: Patient on MS Contin, Percocet, gabapentin.  Hold MS Contin.  start as needed Percocet and resume gabapentin.  Also resume antidepressants.  Requested not to overuse his narcotics and sedating medications.   Lung cancer with progressive disease: Followed up at [facility].  Looks like he has progressive disease now.  Also complains of intermittent hemoptysis which is likely related to this.  [facility].  Palliative care involved.  He would like to continue pursuing treatment and live independent as much as he can.  Requested to follow-up with PCP and his pulmonary physician within a week of discharge.   Smoker: He was counseled to quit not motivated, does not want NicoDerm patch.  Hypertension.  Placed on Norvasc.  Discharge diagnosis      Principal Problem:   Acute metabolic encephalopathy Active Problems:   CAP (community acquired pneumonia)   COPD with acute exacerbation (HCC)   Anemia of chronic disease   Acute hypoxic respiratory failure (HCC)   Acute hyponatremia   Severe sepsis (HCC)   AKI (acute kidney injury) (HCC)   Hypokalemia   Elevated troponin   Hypercalcemia   HLD (hyperlipidemia)   Depression   Pneumonia    Discharge instructions    Discharge Instructions     Diet - low sodium heart healthy   Complete by: As directed    Discharge instructions   Complete by: As directed    Follow with Primary MD [facility] in 7 days    Get CBC, CMP, 2 view Chest X ray -  checked next visit with your primary MD    Activity: As tolerated with Full fall precautions use walker/cane & assistance as needed  Disposition Home    Diet: Heart Healthy     Special Instructions: If you have smoked or chewed Tobacco  in the last 2 yrs please stop smoking, stop any regular Alcohol  and or any Recreational drug use.  On your next visit with your primary care physician please Get Medicines reviewed and adjusted.  Please request your Prim.MD to go over all Hospital Tests and Procedure/Radiological results at the follow up, please get all Hospital records sent to your Prim MD by signing hospital release before you go home.  If you experience worsening of your admission symptoms, develop shortness of breath, life threatening emergency, suicidal or homicidal thoughts you must seek medical attention immediately by calling 911 or calling your MD immediately  if symptoms less severe.  You Must read complete instructions/literature along with all the possible adverse reactions/side effects for all the Medicines you take and that have been prescribed to you. Take any new Medicines after you have completely understood and accpet all the possible adverse reactions/side effects.   Do not drive when taking Pain  medications.  Do not take more than prescribed Pain, Sleep and Anxiety Medications   Increase activity slowly   Complete by: As directed        Discharge Medications   Allergies as of 12/10/2023       Reactions   Levofloxacin Itching, Rash   Doxycycline Nausea And Vomiting        Medication List     STOP taking these medications    amoxicillin-clavulanate 500-125 MG  tablet Commonly known as: AUGMENTIN   feeding supplement Liqd       TAKE these medications    acetaminophen 500 MG tablet Commonly known as: TYLENOL Take 1 tablet (500 mg total) by mouth every 8 (eight) hours as needed for mild pain (pain score 1-3) or moderate pain (pain score 4-6). What changed:  how much to take when to take this   amLODipine 10 MG tablet Commonly known as: NORVASC Take 1 tablet (10 mg total) by mouth daily.   aspirin EC 81 MG tablet Take 1 tablet (81 mg total) by mouth daily. Swallow whole. Start taking on: December 11, 2023 What changed:  additional instructions Another medication with the same name was removed. Continue taking this medication, and follow the directions you see here.   azithromycin 500 MG tablet Commonly known as: ZITHROMAX Take 1 tablet (500 mg total) by mouth daily. Start taking on: December 11, 2023   chlorpheniramine-HYDROcodone 10-8 MG/5ML Commonly known as: TUSSIONEX Take 5 mLs by mouth at bedtime as needed for cough.   cyanocobalamin 1000 MCG tablet Take 0.5 tablets (500 mcg total) by mouth 2 (two) times daily. VITAMIN B-12 What changed: medication strength   donepezil 10 MG tablet Commonly known as: ARICEPT Take 5 mg by mouth daily.   escitalopram 20 MG tablet Commonly known as: LEXAPRO Take 1 tablet (20 mg total) by mouth daily. What changed: when to take this   esomeprazole 20 MG capsule Commonly known as: NEXIUM Take 20 mg by mouth daily at 12 noon.   folic acid 1 MG tablet Commonly known as: FOLVITE Take 1 mg by mouth daily.    gabapentin 400 MG capsule Commonly known as: NEURONTIN Take 400 mg by mouth 2 (two) times daily.   ibuprofen 800 MG tablet Commonly known as: ADVIL Take 800 mg by mouth every 6 (six) hours as needed for mild pain (pain score 1-3), moderate pain (pain score 4-6) or headache. Takes with Oxy if needed   ipratropium-albuterol 0.5-2.5 (3) MG/3ML Soln Commonly known as: DUONEB Take 3 mLs by nebulization 2 (two) times daily.   montelukast 10 MG tablet Commonly known as: SINGULAIR TAKE 1 TABLET AT BEDTIME   morphine 15 MG 12 hr tablet Commonly known as: MS CONTIN Take 15 mg by mouth 2 (two) times daily.   nitrofurantoin 100 MG capsule Commonly known as: MACRODANTIN Take 100 mg by mouth 4 (four) times daily.   oxyCODONE-acetaminophen 5-325 MG tablet Commonly known as: PERCOCET/ROXICET Take 2 tablets by mouth in the morning, at noon, and at bedtime. For pain   predniSONE 5 MG tablet Commonly known as: DELTASONE Label  & dispense according to the schedule below. take 8 Pills PO for 3 days, 6 Pills PO for 3 days, 4 Pills PO for 3 days, 2 Pills PO for 3 days, 1 Pills PO for 3 days, 1/2 Pill  PO for 3 days then STOP. Total 65 pills. What changed:  medication strength how much to take how to take this when to take this additional instructions   promethazine 25 MG tablet Commonly known as: PHENERGAN Take 25 mg by mouth every 12 (twelve) hours as needed for nausea.   rosuvastatin 40 MG tablet Commonly known as: CRESTOR Take 1 tablet (40 mg total) by mouth daily. What changed: when to take this   UNABLE TO FIND Take 1 tablet by mouth daily. Med Name: Valsartan/hydrochlorothiazide 320-25MG  TAB   Vitamin D (Ergocalciferol) 1.25 MG (50000 UNIT) Caps capsule Commonly known as: DRISDOL  Take 50,000 Units by mouth every Wednesday.         Follow-up Information     [facility]. Schedule an appointment as soon as possible for a visit in 1 week(s).   Specialty:  General Practice Contact information: [address] [place] [address] [phone]                 Major procedures and Radiology Reports - PLEASE review detailed and final reports thoroughly  -       ECHOCARDIOGRAM COMPLETE Result Date: 12/08/2023    ECHOCARDIOGRAM REPORT   Patient Name:   [name] Date of Exam: 12/08/2023 Medical Rec #:  [id number]      Height:       68.0 in Accession #:    [id number]     Weight:       123.5 lb Date of Birth:  June 07, 1946       BSA:          1.665 m Patient Age:    77 years       BP:           106/61 mmHg Patient Gender: M              HR:           60 bpm. Exam Location:  Inpatient Procedure: 2D Echo, Color Doppler and Cardiac Doppler (Both Spectral and Color            Flow Doppler were utilized during procedure). Indications:    Elevated Troponin  History:        Patient has prior history of Echocardiogram examinations, most                 recent 06/09/2023. CAD, COPD; Risk Factors:Hypertension and                 Diabetes.  Sonographer:    Harriette Bouillon RDCS Referring Phys: [id number] JUSTIN B HOWERTER IMPRESSIONS  1. Left ventricular ejection fraction, by estimation, is 60 to 65%. The left ventricle has normal function. The left ventricle has no regional wall motion abnormalities. Left ventricular diastolic parameters are consistent with Grade I diastolic dysfunction (impaired relaxation).  2. Right ventricular systolic function is normal. The right ventricular size is normal. There is mildly elevated pulmonary artery systolic pressure.  3. The mitral valve is normal in structure. Trivial mitral valve regurgitation. No evidence of mitral stenosis.  4. Yhe non-coronary cusp is markedly calcied and appears immobile. The aortic valve is tricuspid. There is moderate calcification of the aortic valve. Aortic valve regurgitation is not visualized. No aortic stenosis is present.  5. The inferior vena cava is dilated in size with >50% respiratory variability,  suggesting right atrial pressure of 8 mmHg. FINDINGS  Left Ventricle: Left ventricular ejection fraction, by estimation, is 60 to 65%. The left ventricle has normal function. The left ventricle has no regional wall motion abnormalities. Strain imaging was not performed. The left ventricular internal cavity  size was normal in size. There is no left ventricular hypertrophy. Left ventricular diastolic parameters are consistent with Grade I diastolic dysfunction (impaired relaxation). Right Ventricle: The right ventricular size is normal. No increase in right ventricular wall thickness. Right ventricular systolic function is normal. There is mildly elevated pulmonary artery systolic pressure. The tricuspid regurgitant velocity is 2.70  m/s, and with an assumed right atrial pressure of 8 mmHg, the estimated right ventricular systolic pressure is 37.2 mmHg. Left Atrium: Left  atrial size was normal in size. Right Atrium: Right atrial size was normal in size. Pericardium: There is no evidence of pericardial effusion. Mitral Valve: The mitral valve is normal in structure. Trivial mitral valve regurgitation. No evidence of mitral valve stenosis. Tricuspid Valve: The tricuspid valve is normal in structure. Tricuspid valve regurgitation is trivial. No evidence of tricuspid stenosis. Aortic Valve: Yhe non-coronary cusp is markedly calcied and appears immobile. The aortic valve is tricuspid. There is moderate calcification of the aortic valve. Aortic valve regurgitation is not visualized. No aortic stenosis is present. Pulmonic Valve: The pulmonic valve was normal in structure. Pulmonic valve regurgitation is not visualized. No evidence of pulmonic stenosis. Aorta: The aortic root is normal in size and structure. Venous: The inferior vena cava is dilated in size with greater than 50% respiratory variability, suggesting right atrial pressure of 8 mmHg. IAS/Shunts: No atrial level shunt detected by color flow Doppler. Additional  Comments: 3D imaging was not performed.  LEFT VENTRICLE PLAX 2D LVIDd:         4.00 cm   Diastology LVIDs:         2.50 cm   LV e' medial:    9.14 cm/s LV PW:         1.00 cm   LV E/e' medial:  9.9 LV IVS:        1.00 cm   LV e' lateral:   10.00 cm/s LVOT diam:     2.00 cm   LV E/e' lateral: 9.1 LV SV:         70 LV SV Index:   42 LVOT Area:     3.14 cm  RIGHT VENTRICLE             IVC RV S prime:     17.70 cm/s  IVC diam: 2.50 cm TAPSE (M-mode): 2.0 cm LEFT ATRIUM             Index        RIGHT ATRIUM           Index LA Vol (A2C):   47.7 ml 28.65 ml/m  RA Area:     14.50 cm LA Vol (A4C):   37.2 ml 22.34 ml/m  RA Volume:   31.60 ml  18.98 ml/m LA Biplane Vol: 42.4 ml 25.47 ml/m  AORTIC VALVE LVOT Vmax:   78.30 cm/s LVOT Vmean:  51.000 cm/s LVOT VTI:    0.222 m  AORTA Ao Root diam: 3.00 cm Ao Asc diam:  3.30 cm MITRAL VALVE                TRICUSPID VALVE MV Area (PHT): 3.85 cm     TR Peak grad:   29.2 mmHg MV Decel Time: 197 msec     TR Vmax:        270.00 cm/s MV E velocity: 90.80 cm/s MV A velocity: 102.00 cm/s  SHUNTS MV E/A ratio:  0.89         Systemic VTI:  0.22 m                             Systemic Diam: 2.00 cm Arvilla Meres MD Electronically signed by Arvilla Meres MD Signature Date/Time: 12/08/2023/3:45:56 PM    Final    CT CHEST ABDOMEN PELVIS WO CONTRAST Result Date: 12/07/2023 CLINICAL DATA:  Fall, sepsis, history of lung cancer EXAM: CT CHEST, ABDOMEN AND PELVIS WITHOUT CONTRAST TECHNIQUE: Multidetector CT imaging of the  chest, abdomen and pelvis was performed following the standard protocol without IV contrast. RADIATION DOSE REDUCTION: This exam was performed according to the departmental dose-optimization program which includes automated exposure control, adjustment of the mA and/or kV according to patient size and/or use of iterative reconstruction technique. COMPARISON:  CTA chest dated 08/20/2023 FINDINGS: CT CHEST FINDINGS Cardiovascular: The heart is normal in size. No  pericardial effusion. Stable mild leftward cardiomediastinal shift. No evidence of thoracic aortic aneurysm. Atherosclerotic calcifications of the aortic arch. Moderate coronary atherosclerosis of the LAD and right coronary artery. Mediastinum/Nodes: Progressive mediastinal lymphadenopathy, including a dominant 2.4 cm subcarinal nodal mass (series 5/image 35), previously 16 mm. Visualized thyroid is unremarkable. Lungs/Pleura: Biapical pleural-parenchymal scarring. Moderate centrilobular and paraseptal emphysematous changes, upper lung predominant. Dominant 5.3 x 4.1 cm left lower lobe mass (series 7/image 108), previously 4.4 x 3.0 cm, likely corresponding to the patient's known primary bronchogenic carcinoma. Progressive patchy opacity extending inferiorly to the right lung base (series 7/image 128). Progressive patchy opacity with air bronchograms in the medial right upper lobe (series 7/image 49), favoring chronic scarring and/or post treatment changes, although infection/pneumonia is also possible. Numerous subpleural patchy/nodular opacities in the lungs bilaterally, new/progressive from the prior. This appearance is unusual for infection given the subpleural distribution. Regardless, this is of little clinical consequence in this patient. Trace bilateral pleural effusions, stable on the left and new on the right. No pneumothorax. Musculoskeletal: Mild degenerative changes of the lower thoracic spine. No fracture is seen. CT ABDOMEN PELVIS FINDINGS Hepatobiliary: Unenhanced liver is unremarkable. Gallbladder is unremarkable. No intrahepatic or extrahepatic ductal dilatation. Pancreas: Within normal limits. Spleen: Within normal limits. Adrenals/Urinary Tract: Adrenal glands are within normal limits. Kidneys are within normal limits. No renal calculi or hydronephrosis. Mildly thick-walled bladder, although underdistended. Stomach/Bowel: Stomach is within normal limits. No evidence of bowel obstruction. Appendix  is not discretely visualized Left colonic diverticulosis, without evidence of diverticulitis. Vascular/Lymphatic: No evidence of abdominal aortic aneurysm. Atherosclerotic calcifications of the abdominal aorta and branch vessels. No suspicious abdominopelvic lymphadenopathy. Reproductive: Prostate is unremarkable. Other: No abdominopelvic ascites. Musculoskeletal: Status post PLIF at L1-5. Mild degenerative changes. No fracture is seen. IMPRESSION: No evidence of traumatic injury in the chest, abdomen, or pelvis. Dominant 5.3 cm left lower lobe mass, likely corresponding to the patient's known primary bronchogenic carcinoma. Associated progressive thoracic nodal metastases. Progressive patchy opacity with air bronchograms in the medial right upper lobe, favoring chronic scarring and/or post treatment changes, although infection/pneumonia is also possible. No evidence of metastatic disease in the abdomen/pelvis. Additional ancillary findings as above. Aortic Atherosclerosis (ICD10-I70.0) and Emphysema (ICD10-J43.9). Electronically Signed   By: Charline Bills M.D.   On: 12/07/2023 22:59   CT Head Wo Contrast Result Date: 12/07/2023 CLINICAL DATA:  Fall, sepsis EXAM: CT HEAD WITHOUT CONTRAST CT CERVICAL SPINE WITHOUT CONTRAST TECHNIQUE: Multidetector CT imaging of the head and cervical spine was performed following the standard protocol without intravenous contrast. Multiplanar CT image reconstructions of the cervical spine were also generated. RADIATION DOSE REDUCTION: This exam was performed according to the departmental dose-optimization program which includes automated exposure control, adjustment of the mA and/or kV according to patient size and/or use of iterative reconstruction technique. COMPARISON:  MRI brain dated 07/29/2023 FINDINGS: CT HEAD FINDINGS Brain: No evidence of acute infarction, hemorrhage, hydrocephalus, extra-axial collection or mass lesion/mass effect. Subcortical white matter and  periventricular small vessel ischemic changes. Vascular: Intracranial atherosclerosis. Skull: Normal. Negative for fracture or focal lesion. Sinuses/Orbits: The visualized paranasal sinuses are essentially  clear. The mastoid air cells are unopacified. Other: None. CT CERVICAL SPINE FINDINGS Alignment: Normal cervical lordosis. Skull base and vertebrae: No acute fracture. No primary bone lesion or focal pathologic process. Soft tissues and spinal canal: No prevertebral fluid or swelling. No visible canal hematoma. Disc levels: Mild degenerative changes at C5-6. Spinal canal is patent. Upper chest: Evaluated on dedicated CT chest. Other: None. IMPRESSION: No acute intracranial abnormality. Small vessel ischemic changes. No traumatic injury to the cervical spine. Electronically Signed   By: Charline Bills M.D.   On: 12/07/2023 22:50   CT Cervical Spine Wo Contrast Result Date: 12/07/2023 CLINICAL DATA:  Fall, sepsis EXAM: CT HEAD WITHOUT CONTRAST CT CERVICAL SPINE WITHOUT CONTRAST TECHNIQUE: Multidetector CT imaging of the head and cervical spine was performed following the standard protocol without intravenous contrast. Multiplanar CT image reconstructions of the cervical spine were also generated. RADIATION DOSE REDUCTION: This exam was performed according to the departmental dose-optimization program which includes automated exposure control, adjustment of the mA and/or kV according to patient size and/or use of iterative reconstruction technique. COMPARISON:  MRI brain dated 07/29/2023 FINDINGS: CT HEAD FINDINGS Brain: No evidence of acute infarction, hemorrhage, hydrocephalus, extra-axial collection or mass lesion/mass effect. Subcortical white matter and periventricular small vessel ischemic changes. Vascular: Intracranial atherosclerosis. Skull: Normal. Negative for fracture or focal lesion. Sinuses/Orbits: The visualized paranasal sinuses are essentially clear. The mastoid air cells are unopacified.  Other: None. CT CERVICAL SPINE FINDINGS Alignment: Normal cervical lordosis. Skull base and vertebrae: No acute fracture. No primary bone lesion or focal pathologic process. Soft tissues and spinal canal: No prevertebral fluid or swelling. No visible canal hematoma. Disc levels: Mild degenerative changes at C5-6. Spinal canal is patent. Upper chest: Evaluated on dedicated CT chest. Other: None. IMPRESSION: No acute intracranial abnormality. Small vessel ischemic changes. No traumatic injury to the cervical spine. Electronically Signed   By: Charline Bills M.D.   On: 12/07/2023 22:50   DG Chest 2 View Result Date: 12/07/2023 CLINICAL DATA:  Fall, suspected sepsis EXAM: CHEST - 2 VIEW COMPARISON:  08/20/2023 FINDINGS: Mild left basilar scarring/atelectasis with volume loss. Right lung is clear. Small bilateral pleural effusions. No pneumothorax. The heart is normal in size.  Thoracic aortic atherosclerosis. Exaggerated lower thoracic kyphosis. Lumbar spine fixation hardware, incompletely visualized. IMPRESSION: Small bilateral pleural effusions. Electronically Signed   By: Charline Bills M.D.   On: 12/07/2023 20:44   DG Hip Unilat W or Wo Pelvis 2-3 Views Left Result Date: 12/07/2023 CLINICAL DATA:  Fall EXAM: DG HIP (WITH OR WITHOUT PELVIS) 2-3V LEFT COMPARISON:  None Available. FINDINGS: No fracture or dislocation is seen. Bilateral hip joint spaces are preserved. Visualized bony pelvis appears intact. Postsurgical changes involving the lower lumbar spine, incompletely visualized. IMPRESSION: Negative. Electronically Signed   By: Charline Bills M.D.   On: 12/07/2023 20:43    Micro Results    Recent Results (from the past 240 hours)  Culture, blood (Routine x 2)     Status: None (Preliminary result)   Collection Time: 12/07/23  8:11 PM   Specimen: BLOOD  Result Value Ref Range Status   Specimen Description BLOOD RIGHT FOREARM  Final   Special Requests   Final    BOTTLES DRAWN AEROBIC AND  ANAEROBIC Blood Culture adequate volume   Culture   Final    NO GROWTH 3 DAYS Performed at [facility], [address], [place], [place] [address]    Report Status PENDING  Incomplete  Culture,  blood (Routine x 2)     Status: None (Preliminary result)   Collection Time: 12/07/23  9:10 PM   Specimen: BLOOD RIGHT ARM  Result Value Ref Range Status   Specimen Description BLOOD RIGHT ARM  Final   Special Requests   Final    BOTTLES DRAWN AEROBIC AND ANAEROBIC Blood Culture adequate volume   Culture   Final    NO GROWTH 3 DAYS Performed at [facility], [address] [place], [place] [address]    Report Status PENDING  Incomplete  Resp panel by RT-PCR (RSV, Flu A&B, Covid) Anterior Nasal Swab     Status: None   Collection Time: 12/07/23  9:10 PM   Specimen: Anterior Nasal Swab  Result Value Ref Range Status   SARS Coronavirus 2 by RT PCR NEGATIVE NEGATIVE Final   Influenza A by PCR NEGATIVE NEGATIVE Final   Influenza B by PCR NEGATIVE NEGATIVE Final    Comment: (NOTE) The Xpert Xpress SARS-CoV-2/FLU/RSV plus assay is intended as an aid in the diagnosis of influenza from Nasopharyngeal swab specimens and should not be used as a sole basis for treatment. Nasal washings and aspirates are unacceptable for Xpert Xpress SARS-CoV-2/FLU/RSV testing.  Fact Sheet for Patients: BloggerCourse.com  Fact Sheet for Healthcare Providers: SeriousBroker.it  This test is not yet approved or cleared by the [place] FDA and has been authorized for detection and/or diagnosis of SARS-CoV-2 by FDA under an Emergency Use Authorization (EUA). This EUA will remain in effect (meaning this test can be used) for the duration of the COVID-19 declaration under Section 564(b)(1) of the Act, 21 U.S.C. section 360bbb-3(b)(1), unless the authorization is terminated or revoked.     Resp Syncytial Virus by PCR NEGATIVE NEGATIVE Final     Comment: (NOTE) Fact Sheet for Patients: BloggerCourse.com  Fact Sheet for Healthcare Providers: SeriousBroker.it  This test is not yet approved or cleared by the [place] FDA and has been authorized for detection and/or diagnosis of SARS-CoV-2 by FDA under an Emergency Use Authorization (EUA). This EUA will remain in effect (meaning this test can be used) for the duration of the COVID-19 declaration under Section 564(b)(1) of the Act, 21 U.S.C. section 360bbb-3(b)(1), unless the authorization is terminated or revoked.  Performed at [facility], [address], [place], [place] [address]     Today   Subjective    [name] today has no headache,no chest abdominal pain,no new weakness tingling or numbness, feels much better wants to go home today.    Objective   Blood pressure 150/80, pulse 81, temperature 97.8 F (36.6 C), temperature source Oral, resp. rate (!) 24, weight 56.7 kg, SpO2 90%.   Intake/Output Summary (Last 24 hours) at 12/10/2023 1018 Last data filed at 12/10/2023 0535 Gross per 24 hour  Intake --  Output 500 ml  Net -500 ml    Exam  Awake Alert, No new F.N deficits,    [place].AT,PERRAL Supple Neck,   Symmetrical Chest wall movement, Good air movement bilaterally, CTAB RRR,No Gallops,   +ve B.Sounds, Abd Soft, Non tender,  No Cyanosis, Clubbing or edema    Data Review   Recent Labs  Lab 12/07/23 2012 12/07/23 2117 12/08/23 0442 12/09/23 0451  WBC 13.1*  --  7.8 13.1*  HGB 10.0* 10.5* 8.3* 9.0*  HCT 29.5* 31.0* 25.5* 27.6*  PLT 259  --  201 240  MCV 92.5  --  95.1 94.8  MCH 31.3  --  31.0 30.9  MCHC 33.9  --  32.5 32.6  RDW 13.5  --  13.8 13.8  LYMPHSABS 0.8  --  0.4* 0.4*  MONOABS 0.9  --  0.2 0.4  EOSABS 0.0  --  0.0 0.0  BASOSABS 0.0  --  0.0 0.0    Recent Labs  Lab 12/07/23 2012 12/07/23 2027 12/07/23 2110 12/07/23 2117 12/07/23 2315 12/08/23 0442 12/09/23 0451  12/10/23 0821  NA 130*  --   --  131*  --  132* 137 138  K 2.6*  --   --  2.7*  --  3.6 3.0* 3.7  CL 94*  --   --  94*  --  104 105 104  CO2 23  --   --   --   --  21* 22 24  ANIONGAP 13  --   --   --   --  7 10 10   GLUCOSE 129*  --   --  110*  --  120* 128* 154*  BUN 24*  --   --  25*  --  20 23 29*  CREATININE 1.75*  --   --  1.80*  --  1.51* 1.16 1.04  AST 55*  --   --   --   --  42* 36  --   ALT 44  --   --   --   --  33 32  --   ALKPHOS 114  --   --   --   --  85 86  --   BILITOT 0.5  --   --   --   --  0.6 0.4  --   ALBUMIN 2.6*  --   --   --   --  2.0* 1.9*  --   PROCALCITON  --   --   --   --  0.52  --   --   --   LATICACIDVEN  --  0.7  --   --   --   --   --   --   INR 1.1  --   --   --   --   --   --   --   TSH  --   --   --   --   --  0.612  --   --   AMMONIA  --   --   --   --   --  34  --   --   BNP  --   --  197.3*  --   --   --   --   --   MG  --   --   --   --  1.8 1.7 1.8  --   PHOS  --   --   --   --   --  2.5 3.3  --   CALCIUM 9.7  --   --   --   --  8.9 8.9 9.8    Total Time in preparing paper work, data evaluation and todays exam - 35 minutes  Signature  -    Susa Raring M.D on 12/10/2023 at 10:18 AM   -  To page go to [email or web address]

## 2023-12-10 NOTE — Progress Notes (Signed)
   Palliative Medicine Inpatient Follow Up Note HPI: 78 year old gentleman with history of left lower lobe lung cancer persistent, COPD, ongoing smoker, chronic pain syndrome, anemia of chronic disease noted to be very confused at home related to the baseline mental status so EMS was called. Patient also was reporting excess coughing and occasionally coughing up blood to the neighbor.    The Palliative Care team has been asked to have goals of care conversations.   Today's Discussion 12/10/2023  *Please note that this is a verbal dictation therefore any spelling or grammatical errors are due to the "Dragon Medical One" system interpretation.  Chart reviewed inclusive of vital signs, progress notes, laboratory results, and diagnostic images.   I met with Daymen at bedside this late morning. We discussed his present clinical state. We reviewed that overall he is feeling much improved.   Discussions held regarding resuscitation.  I completed a MOST form today. The patient and family outlined their wishes for the following treatment decisions:  Cardiopulmonary Resuscitation: Do Not Attempt Resuscitation (DNR/No CPR)  Medical Interventions: Full Scope of Treatment: Use intubation, advanced airway interventions, mechanical ventilation, cardioversion as indicated, medical treatment, IV fluids, etc, also provide comfort measures. Transfer to the hospital if indicated  Antibiotics: Antibiotics if indicated  IV Fluids: IV fluids if indicated  Feeding Tube: Feeding tube for a defined trial period   We reviewed the plan for Ryden to discharge today.   Questions and concerns addressed/Palliative Support Provided.   Objective Assessment: Vital Signs Vitals:   12/10/23 0400 12/10/23 0800  BP:  (!) 153/86  Pulse:  81  Resp:  (!) 24  Temp: (!) 97.5 F (36.4 C) 97.8 F (36.6 C)  SpO2: 94% 90%    Intake/Output Summary (Last 24 hours) at 12/10/2023 1241 Last data filed at 12/10/2023 0915 Gross per  24 hour  Intake 360 ml  Output 500 ml  Net -140 ml   Last Weight  Most recent update: 12/10/2023  6:35 AM    Weight  56.7 kg (125 lb)            Gen: Elderly Caucasian male in no acute distress HEENT: moist mucous membranes CV: Regular rate and rhythm  PULM: Breathing regularly on room air ABD: soft/nontender  EXT: No edema  Neuro: Alert and oriented x3   SUMMARY OF RECOMMENDATIONS   DNAR/Full scope of care  DNR Form Completed   Plan for transition home today  Billing based on MDM: High _______________________________________________________________________ Lamarr Lulas [facility] Palliative [facility] Team Team Cell Phone: [phone] Please utilize secure chat with additional questions, if there is no response within 30 minutes please call the above phone number  Palliative Medicine Team providers are available by phone from 7am to 7pm daily and can be reached through the team cell phone.  Should this patient require assistance outside of these hours, please call the patient's attending physician.

## 2023-12-10 NOTE — Plan of Care (Signed)
 Pt has rested quietly throughout the night with no distress noted. Alert and oriented to person, place and time. On O22LNC. SR on the monitor. Voids per urinal. Has had 2 BM's through the shift. Incontinent once and bedpan the other. Medicated with MS contin with relief noted. No other complaints voiced.     Problem: Education: Goal: Knowledge of General Education information will improve Description: Including pain rating scale, medication(s)/side effects and non-pharmacologic comfort measures Outcome: Progressing   Problem: Clinical Measurements: Goal: Respiratory complications will improve Outcome: Progressing Goal: Cardiovascular complication will be avoided Outcome: Progressing   Problem: Activity: Goal: Risk for activity intolerance will decrease Outcome: Progressing   Problem: Nutrition: Goal: Adequate nutrition will be maintained Outcome: Progressing   Problem: Elimination: Goal: Will not experience complications related to bowel motility Outcome: Progressing Goal: Will not experience complications related to urinary retention Outcome: Progressing   Problem: Pain Managment: Goal: General experience of comfort will improve and/or be controlled Outcome: Progressing

## 2023-12-10 NOTE — TOC Transition Note (Signed)
 Transition of Care [facility]) - Discharge Note   Patient Details  Name: [name] MRN: [id number] Date of Birth: October 27, 1945  Transition of Care [facility]) CM/SW Contact:  Gordy Clement, RN Phone Number: 12/10/2023, 10:00 AM   Clinical Narrative:    Patient to DC to home today. No recommendations for DME or HH. No TOC needs identified Family to transport           Patient Goals and CMS Choice            Discharge Placement                       Discharge Plan and Services Additional resources added to the After Visit Summary for                                       Social Drivers of Health (SDOH) Interventions SDOH Screenings   Food Insecurity: No Food Insecurity (12/09/2023)  Housing: Low Risk  (12/09/2023)  Transportation Needs: No Transportation Needs (12/09/2023)  Utilities: Not At Risk (12/09/2023)  Financial Resource Strain: Low Risk  (08/13/2023)   Received from [facility]  Social Connections: Socially Isolated (12/09/2023)  Tobacco Use: High Risk (12/08/2023)     Readmission Risk Interventions    07/29/2023    3:36 PM  Readmission Risk Prevention Plan  Transportation Screening Complete  Medication Review (RN Care Manager) Complete  PCP or Specialist appointment within 3-5 days of discharge Complete  SW Recovery Care/Counseling Consult Complete  Skilled Nursing Facility Complete

## 2023-12-10 NOTE — Progress Notes (Signed)
 Physical Therapy Treatment Patient Details Name: [name] MRN: [id number] DOB: [birth year] Today's Date: 12/10/2023   History of Present Illness Pt is 78 year old presented to [facility] on  12/07/23 for AMS. Found to have acute hypoxic respiratory failure, severe sepsis due to PNA PMH - lung ca, dementia, CAD, htn, opioid pain dependence, depression, hyperlipidemia, COPD, lower GI bleed.    PT Comments  Pt tolerated treatment well today. Pt was able to ambulate in hallway around unit with no AD CGA. No change in DC/DME recs at this time. Pt anticipates DC home today.   If plan is discharge home, recommend the following: Supervision due to cognitive status   Can travel by private vehicle        Equipment Recommendations  None recommended by PT    Recommendations for Other Services       Precautions / Restrictions Precautions Precautions: Fall Recall of Precautions/Restrictions: Intact Restrictions Weight Bearing Restrictions Per Provider Order: No     Mobility  Bed Mobility Overal bed mobility: Modified Independent Bed Mobility: Supine to Sit, Sit to Supine     Supine to sit: Modified independent (Device/Increase time) Sit to supine: Modified independent (Device/Increase time)        Transfers Overall transfer level: Independent Equipment used: None Transfers: Sit to/from Stand Sit to Stand: Independent                Ambulation/Gait Ambulation/Gait assistance: Contact guard assist Gait Distance (Feet): 150 Feet Assistive device: None Gait Pattern/deviations: Step-through pattern, Decreased stride length, Drifts right/left, Trunk flexed Gait velocity: decr     General Gait Details: Assist for safety. Pt able to self correct minor loss of balance   Stairs Stairs: Yes Stairs assistance: Supervision     General stair comments: Pt able to march in place in room to simulate steps.   Wheelchair Mobility     Tilt Bed    Modified Rankin (Stroke  Patients Only)       Balance Overall balance assessment: Mild deficits observed, not formally tested                                          Communication Communication Communication: No apparent difficulties  Cognition Arousal: Alert Behavior During Therapy: WFL for tasks assessed/performed   PT - Cognitive impairments: Awareness, Memory                       PT - Cognition Comments: Not quite at his baseline however appeared to be improved today. Following commands: Intact      Cueing Cueing Techniques: Verbal cues  Exercises      General Comments General comments (skin integrity, edema, etc.): VSS on RA      Pertinent Vitals/Pain Pain Assessment Pain Assessment: No/denies pain    Home Living                          Prior Function            PT Goals (current goals can now be found in the care plan section) Progress towards PT goals: Progressing toward goals    Frequency    Min 1X/week      PT Plan      Co-evaluation              AM-PAC PT "6  Clicks" Mobility   Outcome Measure  Help needed turning from your back to your side while in a flat bed without using bedrails?: None Help needed moving from lying on your back to sitting on the side of a flat bed without using bedrails?: A Little Help needed moving to and from a bed to a chair (including a wheelchair)?: A Little Help needed standing up from a chair using your arms (e.g., wheelchair or bedside chair)?: A Little Help needed to walk in hospital room?: A Little Help needed climbing 3-5 steps with a railing? : A Little 6 Click Score: 19    End of Session Equipment Utilized During Treatment: Gait belt Activity Tolerance: Patient tolerated treatment well Patient left: in bed;with call bell/phone within reach Nurse Communication: Mobility status PT Visit Diagnosis: Muscle weakness (generalized) (M62.81);Other abnormalities of gait and mobility  (R26.89)     Time: 2130-8657 PT Time Calculation (min) (ACUTE ONLY): 10 min  Charges:    $Gait Training: 8-22 mins PT General Charges $$ ACUTE PT VISIT: 1 Visit                     Shela Nevin, PT, DPT Acute Rehab Services [phone]    [name] 12/10/2023, 11:05 AM

## 2023-12-10 NOTE — Discharge Instructions (Signed)
 Follow with Primary MD Center, [facility] in 7 days    Get CBC, CMP, 2 view Chest X ray -  checked next visit with your primary MD    Activity: As tolerated with Full fall precautions use walker/cane & assistance as needed  Disposition Home    Diet: Heart Healthy     Special Instructions: If you have smoked or chewed Tobacco  in the last 2 yrs please stop smoking, stop any regular Alcohol  and or any Recreational drug use.  On your next visit with your primary care physician please Get Medicines reviewed and adjusted.  Please request your Prim.MD to go over all Hospital Tests and Procedure/Radiological results at the follow up, please get all Hospital records sent to your Prim MD by signing hospital release before you go home.  If you experience worsening of your admission symptoms, develop shortness of breath, life threatening emergency, suicidal or homicidal thoughts you must seek medical attention immediately by calling 911 or calling your MD immediately  if symptoms less severe.  You Must read complete instructions/literature along with all the possible adverse reactions/side effects for all the Medicines you take and that have been prescribed to you. Take any new Medicines after you have completely understood and accpet all the possible adverse reactions/side effects.   Do not drive when taking Pain medications.  Do not take more than prescribed Pain, Sleep and Anxiety Medications

## 2023-12-10 NOTE — TOC Initial Note (Signed)
 Transition of Care [facility]) - Initial/Assessment Note    Patient Details  Name: [name] MRN: [id number] Date of Birth: [birth year]  [facility] CM/SW Contact:    Lamonte Sakai, Student-Social Work Phone Number: 12/10/2023, 9:10 AM  Clinical Narrative:                  Pt admitted from home. No current TOC needs, please consult as needs arise.       Patient Goals and CMS Choice            Expected Discharge Plan and Services       Living arrangements for the past 2 months: Single Family Home                                      Prior Living Arrangements/Services Living arrangements for the past 2 months: Single Family Home                     Activities of Daily Living      Permission Sought/Granted                  Emotional Assessment       Orientation: : Oriented to Self, Oriented to Place, Oriented to  Time Alcohol / Substance Use: Tobacco Use    Admission diagnosis:  Pneumonia [J18.9] Altered mental status, unspecified altered mental status type [R41.82] Acute metabolic encephalopathy [G93.41] Sepsis with acute renal failure without septic shock, due to unspecified organism, unspecified acute renal failure type (HCC) [A41.9, R65.20, N17.9] Patient Active Problem List   Diagnosis Date Noted   Pneumonia 12/09/2023   Severe sepsis (HCC) 12/08/2023   AKI (acute kidney injury) (HCC) 12/08/2023   Hypokalemia 12/08/2023   Elevated troponin 12/08/2023   Hypercalcemia 12/08/2023   HLD (hyperlipidemia) 12/08/2023   Depression 12/08/2023   Acute metabolic encephalopathy 12/07/2023   Acute bronchitis 08/21/2023   Acute hyponatremia 07/29/2023   Protein-calorie malnutrition, severe 07/29/2023   Weakness 07/28/2023   Headache 07/28/2023   CAP (community acquired pneumonia) 07/28/2023   Hypophosphatemia 06/19/2023   Gastroenteritis due to COVID-19 virus 06/17/2023   Encephalopathy due to COVID-19 virus 06/17/2023    Lung cancer (HCC) 06/04/2023   Preoperative clearance 06/04/2023   Multiple fractures of ribs, left side, initial encounter for closed fracture 01/03/2023   Mediastinal adenopathy 01/03/2023   Aspiration pneumonia (HCC) 01/02/2023   Dementia without behavioral disturbance (HCC) 01/02/2023   GERD without esophagitis 01/02/2023   COPD with acute exacerbation (HCC) 01/02/2023   Alcohol withdrawal delirium (HCC) 01/11/2020   Lobar pneumonia, unspecified organism (HCC) 01/11/2020   Acute hypoxic respiratory failure (HCC) 01/11/2020   Acute pulmonary edema (HCC) 01/11/2020   Toxic metabolic encephalopathy 01/11/2020   Anemia of chronic disease 01/11/2020   Narcotic dependency, continuous (HCC) 01/11/2020   Centrilobular emphysema (HCC) 02/13/2017   Chronic bilateral low back pain with right-sided sciatica 02/13/2017   Gastroesophageal reflux disease 02/13/2017   Major depressive disorder, recurrent, in full remission (HCC) 02/13/2017   CAD (coronary artery disease) 03/24/2011   Tobacco user 03/24/2011   Pure hypercholesterolemia 03/24/2011   PCP:  [facility]:   [facility] DELIVERY - [place] - [address] [place] [address] Phone: [phone] Fax: [phone]  [facility] [phone] - [address] [place], [place] - [address] AT [facility] [address] [place] [address] Phone: [phone] Fax: [phone]  [facility] - [facility] [address] [place] [address] Phone: [phone] Fax: [phone]  [facility] [address] [place] [address] Phone: [phone] Fax: [phone]     Social Drivers of Health (SDOH) Social History: SDOH Screenings   Food Insecurity: No Food Insecurity (12/09/2023)  Housing: Low Risk  (12/09/2023)  Transportation Needs: No Transportation Needs (12/09/2023)  Utilities:  Not At Risk (12/09/2023)  Financial Resource Strain: Low Risk  (08/13/2023)   Received from [facility]  Social Connections: Socially Isolated (12/09/2023)  Tobacco Use: High Risk (12/08/2023)   SDOH Interventions:     Readmission Risk Interventions    07/29/2023    3:36 PM  Readmission Risk Prevention Plan  Transportation Screening Complete  Medication Review (RN Care Manager) Complete  PCP or Specialist appointment within 3-5 days of discharge Complete  SW Recovery Care/Counseling Consult Complete  Skilled Nursing Facility Complete

## 2023-12-10 NOTE — Progress Notes (Signed)
 This chaplain responded to PMT NP-Michelle consult for creating/updating the Pt. Advance Directive. The Pt. is sitting on the EOB preparing for discharge.  The chaplain provided AD education, which included how to complete an AD outside the hospital. The Pt. shares he is doing some "soul searching" about his decision for HCPOA among his two daughters. The chaplain understands the daughter will F/U with his PCP on his AD.  Chaplain Stephanie Acre [phone]

## 2023-12-12 LAB — CULTURE, BLOOD (ROUTINE X 2)
Culture: NO GROWTH
Culture: NO GROWTH
Special Requests: ADEQUATE
Special Requests: ADEQUATE

## 2024-01-16 ENCOUNTER — Telehealth: Payer: Self-pay | Admitting: Internal Medicine

## 2024-01-16 NOTE — Telephone Encounter (Signed)
 Lvm to return call for appointment to re-establish care-[name]

## 2024-01-27 ENCOUNTER — Ambulatory Visit (INDEPENDENT_AMBULATORY_CARE_PROVIDER_SITE_OTHER): Admitting: Internal Medicine

## 2024-01-27 ENCOUNTER — Encounter: Payer: Self-pay | Admitting: Internal Medicine

## 2024-01-27 VITALS — BP 125/70 | HR 79 | Temp 98.1°F | Resp 16 | Ht 68.0 in | Wt 121.6 lb

## 2024-01-27 DIAGNOSIS — F17200 Nicotine dependence, unspecified, uncomplicated: Secondary | ICD-10-CM

## 2024-01-27 DIAGNOSIS — G894 Chronic pain syndrome: Secondary | ICD-10-CM | POA: Diagnosis not present

## 2024-01-27 DIAGNOSIS — C3492 Malignant neoplasm of unspecified part of left bronchus or lung: Secondary | ICD-10-CM | POA: Diagnosis not present

## 2024-01-27 DIAGNOSIS — J4489 Other specified chronic obstructive pulmonary disease: Secondary | ICD-10-CM | POA: Diagnosis not present

## 2024-01-27 DIAGNOSIS — R0602 Shortness of breath: Secondary | ICD-10-CM | POA: Diagnosis not present

## 2024-01-27 NOTE — Progress Notes (Signed)
 [facility] [address] [place], [place] [address]  Pulmonary Sleep Medicine   Office Visit Note  Patient Name: [name] DOB: 1946/02/13 MRN [id number]  Date of Service: 01/27/2024  Complaints/HPI: He had lung cancer diagnosed recently and he states he has been going to the [facility] for his therapy. Recently diagnosed with pneumonia and was admitted to [facility]. Patient states he has been doing ok since then. Patient was diagnosed with Ca in 2021 and now has a recurrence. He states he is not on oxygen and he states he has been using his nebulizer and is not on it right now because he was not sure how to use it. Patient has no cough noted. He states he is still smoking. He has no desire to quit. Last CT chest in feb shows mass 5.3cm He is not receiving chemo at this time.   Office Spirometry Results: Peak Flow: (!) 3 L/min FEV1: 1.23 liters FVC: 2.36 liters FEV1/FVC: 52.1 % FVC  % Predicted: 64 % FEV % Predicted: 47 % FeF 25-75: 0.6 liters FeF 25-75 % Predicted: 34   ROS  General: (-) fever, (-) chills, (-) night sweats, (-) weakness Skin: (-) rashes, (-) itching,. Eyes: (-) visual changes, (-) redness, (-) itching. Nose and Sinuses: (-) nasal stuffiness or itchiness, (-) postnasal drip, (-) nosebleeds, (-) sinus trouble. Mouth and Throat: (-) sore throat, (-) hoarseness. Neck: (-) swollen glands, (-) enlarged thyroid, (-) neck pain. Respiratory: + cough, (-) bloody sputum, + shortness of breath, + wheezing. Cardiovascular: - ankle swelling, (-) chest pain. Lymphatic: (-) lymph node enlargement. Neurologic: (-) numbness, (-) tingling. Psychiatric: (-) anxiety, (-) depression   Current Medication: Outpatient Encounter Medications as of 01/27/2024  Medication Sig Note   acetaminophen  (TYLENOL ) 500 MG tablet Take 1 tablet (500 mg total) by mouth every 8 (eight) hours as needed for mild pain (pain score 1-3) or moderate pain (pain score 4-6).    amLODipine  (NORVASC )  10 MG tablet Take 1 tablet (10 mg total) by mouth daily.    aspirin  EC 81 MG tablet Take 1 tablet (81 mg total) by mouth daily. Swallow whole.    cyanocobalamin  1000 MCG tablet Take 0.5 tablets (500 mcg total) by mouth 2 (two) times daily. VITAMIN B-12    donepezil  (ARICEPT ) 10 MG tablet Take 5 mg by mouth daily. 12/08/2023: Unknown Last dose   escitalopram  (LEXAPRO ) 20 MG tablet Take 1 tablet (20 mg total) by mouth daily. (Patient taking differently: Take 20 mg by mouth in the morning.) 12/08/2023: Unknown Last dose   esomeprazole  (NEXIUM ) 20 MG capsule Take 20 mg by mouth daily at 12 noon. 12/08/2023: Unknown Last dose   folic acid  (FOLVITE ) 1 MG tablet Take 1 mg by mouth daily. 12/08/2023: Unknown Last dose   gabapentin  (NEURONTIN ) 400 MG capsule Take 400 mg by mouth 2 (two) times daily. 12/08/2023: Unknown Last dose   ibuprofen  (ADVIL ) 800 MG tablet Take 800 mg by mouth every 6 (six) hours as needed for mild pain (pain score 1-3), moderate pain (pain score 4-6) or headache. Takes with Oxy if needed 12/08/2023: Unknown Last dose   ipratropium-albuterol  (DUONEB) 0.5-2.5 (3) MG/3ML SOLN Take 3 mLs by nebulization 2 (two) times daily. 12/08/2023: Unknown Last dose   montelukast  (SINGULAIR ) 10 MG tablet TAKE 1 TABLET AT BEDTIME    morphine  (MS CONTIN ) 15 MG 12 hr tablet Take 15 mg by mouth 2 (two) times daily. 12/08/2023: Unknown Last dose   nitrofurantoin (MACRODANTIN) 100 MG capsule  Take 100 mg by mouth 4 (four) times daily. 12/08/2023: Unknown Last dose   oxyCODONE -acetaminophen  (PERCOCET) 5-325 MG per tablet Take 2 tablets by mouth in the morning, at noon, and at bedtime. For pain 12/08/2023: Unknown Last dose   promethazine  (PHENERGAN ) 25 MG tablet Take 25 mg by mouth every 12 (twelve) hours as needed for nausea. 12/08/2023: Unknown Last dose   rosuvastatin  (CRESTOR ) 40 MG tablet Take 1 tablet (40 mg total) by mouth daily. (Patient taking differently: Take 40 mg by mouth every evening.) 12/08/2023: Unknown  Last dose   UNABLE TO FIND Take 1 tablet by mouth daily. Med Name: Valsartan /hydrochlorothiazide 320-25MG  TAB 12/08/2023: Unknown Last dose   Vitamin D , Ergocalciferol , (DRISDOL ) 50000 UNITS CAPS capsule Take 50,000 Units by mouth every Wednesday.  12/08/2023: Unknown Last dose   [DISCONTINUED] azithromycin  (ZITHROMAX ) 500 MG tablet Take 1 tablet (500 mg total) by mouth daily.    [DISCONTINUED] chlorpheniramine-HYDROcodone (TUSSIONEX) 10-8 MG/5ML Take 5 mLs by mouth at bedtime as needed for cough. 12/08/2023: Unknown Last dose   No facility-administered encounter medications on file as of 01/27/2024.    Surgical History: Past Surgical History:  Procedure Laterality Date   APPENDECTOMY  1957   BACK SURGERY     CARDIAC CATHETERIZATION  1999   stent placement RCA   CARDIAC CATHETERIZATION  2010   mild left CA distal stenosis, mild irregularities in RCA and LAD. patent setn in RCA   COLONOSCOPY WITH PROPOFOL  N/A 06/16/2018   Procedure: COLONOSCOPY WITH PROPOFOL ;  Surgeon: Toledo, Alphonsus Jeans, MD;  Location: [facility] ENDOSCOPY;  Service: Gastroenterology;  Laterality: N/A;   CORONARY ANGIOPLASTY     CORONARY ARTERY BYPASS GRAFT     denies 09/30/19   ESOPHAGOGASTRODUODENOSCOPY (EGD) WITH PROPOFOL  N/A 06/16/2018   Procedure: ESOPHAGOGASTRODUODENOSCOPY (EGD) WITH PROPOFOL ;  Surgeon: Toledo, Alphonsus Jeans, MD;  Location: [facility] ENDOSCOPY;  Service: Gastroenterology;  Laterality: N/A;   FOOT SURGERY  1973   left   LEFT HEART CATH AND CORONARY ANGIOGRAPHY N/A 01/22/2018   Procedure: LEFT HEART CATH AND CORONARY ANGIOGRAPHY;  Surgeon: Odie Benne, MD;  [facility];  Service: Cardiovascular;  Laterality: N/A;   LEFT HEART CATHETERIZATION WITH CORONARY ANGIOGRAM N/A 01/11/2015   Procedure: LEFT HEART CATHETERIZATION WITH CORONARY ANGIOGRAM;  Surgeon: Odie Benne, MD;  Location: [facility] CATH LAB;  Service: Cardiovascular;  Laterality: N/A;   OLECRANON BURSECTOMY Right 10/04/2019    Procedure: OLECRANON BURSA;  Surgeon: Lorri Rota, MD;  Location: [facility] ORS;  Service: Orthopedics;  Laterality: Right;   TRICEPS TENDON REPAIR Right 10/04/2019   Procedure: TRICEPS TENDON REPAIR AND PLECRANON BURSA EXCISION, POSSIBLE ALLOGRAFT AUGMENTATION.;  Surgeon: Lorri Rota, MD;  Location: [facility] ORS;  Service: Orthopedics;  Laterality: Right;    Medical History: Past Medical History:  Diagnosis Date   Anemia    Arthritis    Back pain    scoliosis   CAD (coronary artery disease) 03/24/2011   - S/p stent to RCA in 1999  - Cath in 2019 with patent stent and mod nonobstructive disease elsewhere  - Myoview  06/05/23: EF 61, normal perfusion, low risk     COPD (chronic obstructive pulmonary disease) (HCC)    Depression    Diverticulosis    GERD (gastroesophageal reflux disease)    GI bleed    History of colon polyps    History of hiatal hernia    Hyperkalemia    Hyperlipidemia    Hypertension    Lung cancer (HCC) 06/04/2023   Radiation Rx;  [facility]    Scoliosis    Tobacco abuse     Family History: Family History  Problem Relation Age of Onset   Alzheimer's disease Father    Angina Father    Diabetes Cousin        mothers side   Heart attack Cousin        fathers side   Hyperlipidemia Cousin        fathers side   Hypertension Cousin        fathers side    Social History: Social History   Socioeconomic History   Marital status: Divorced    Spouse name: Not on file   Number of children: 3   Years of education: Not on file   Highest education level: Not on file  Occupational History    Employer: RETIRED  Tobacco Use   Smoking status: Every Day    Current packs/day: 0.50    Average packs/day: 0.5 packs/day for 35.0 years (17.5 ttl pk-yrs)    Types: Cigarettes   Smokeless tobacco: Never   Tobacco comments:    10 cigarettes daily.  Vaping Use   Vaping status: Never Used  Substance and Sexual Activity   Alcohol use: Yes    Alcohol/week: 6.0  standard drinks of alcohol    Types: 6 Cans of beer per week   Drug use: No   Sexual activity: Not Currently  Other Topics Concern   Not on file  Social History Narrative   Not on file   Social Drivers of Health   Financial Resource Strain: Low Risk  (08/13/2023)   Received [facility]   Overall Financial Resource Strain (CARDIA)    Difficulty of Paying Living Expenses: Not hard at all  Food Insecurity: No Food Insecurity (12/09/2023)   Hunger Vital Sign    Worried About Running Out of Food in the Last Year: Never true    Ran Out of Food in the Last Year: Never true  Transportation Needs: No Transportation Needs (12/09/2023)   PRAPARE - Administrator, Civil Service (Medical): No    Lack of Transportation (Non-Medical): No  Physical Activity: Not on file  Stress: Not on file  Social Connections: Socially Isolated (12/09/2023)   Social Connection and Isolation Panel [NHANES]    Frequency of Communication with Friends and Family: Three times a week    Frequency of Social Gatherings with Friends and Family: Twice a week    Attends Religious Services: Never    Database administrator or Organizations: No    Attends Banker Meetings: Never    Marital Status: Divorced  Catering manager Violence: Not At Risk (12/09/2023)   Humiliation, Afraid, Rape, and Kick questionnaire    Fear of Current or Ex-Partner: No    Emotionally Abused: No    Physically Abused: No    Sexually Abused: No    Vital Signs: Blood pressure 125/70, pulse 79, temperature 98.1 F (36.7 C), resp. rate 16, height 5\' 8"  (1.727 m), weight 121 lb 9.6 oz (55.2 kg), SpO2 94%, peak flow (!) 3 L/min.  Examination: General Appearance: The patient is well-developed, well-nourished, and in no distress. Skin: Gross inspection of skin unremarkable. Head: normocephalic, no gross deformities. Eyes: no gross deformities noted. ENT: ears appear grossly normal no exudates. Neck:  Supple. No thyromegaly. No LAD. Respiratory: no rhonchi noted at this time. Cardiovascular: Normal S1 and S2 without murmur or rub. Extremities: No cyanosis. pulses are  equal. Neurologic: Alert and oriented. No involuntary movements.  LABS: Recent Results (from the past 2160 hours)  CBG monitoring, ED     Status: Abnormal   Collection Time: 12/07/23  8:00 PM  Result Value Ref Range   Glucose-Capillary 137 (H) 70 - 99 mg/dL    Comment: Glucose reference range applies only to samples taken after fasting for at least 8 hours.  Culture, blood (Routine x 2)     Status: None   Collection Time: 12/07/23  8:11 PM   Specimen: BLOOD  Result Value Ref Range   Specimen Description BLOOD RIGHT FOREARM    Special Requests      BOTTLES DRAWN AEROBIC AND ANAEROBIC Blood Culture adequate volume   Culture      NO GROWTH 5 DAYS Performed at [facility], [address] [place], [place] [address]    Report Status 12/12/2023 FINAL   Comprehensive metabolic panel     Status: Abnormal   Collection Time: 12/07/23  8:12 PM  Result Value Ref Range   Sodium 130 (L) 135 - 145 mmol/L   Potassium 2.6 (LL) 3.5 - 5.1 mmol/L    Comment: CRITICAL RESULT CALLED TO, READ BACK BY AND VERIFIED WITH J. DEER RN @2126  12/07/2023 S. BYRD   Chloride 94 (L) 98 - 111 mmol/L   CO2 23 22 - 32 mmol/L   Glucose, Bld 129 (H) 70 - 99 mg/dL    Comment: Glucose reference range applies only to samples taken after fasting for at least 8 hours.   BUN 24 (H) 8 - 23 mg/dL   Creatinine, Ser 6.04 (H) 0.61 - 1.24 mg/dL   Calcium  9.7 8.9 - 10.3 mg/dL   Total Protein 7.4 6.5 - 8.1 g/dL   Albumin 2.6 (L) 3.5 - 5.0 g/dL   AST 55 (H) 15 - 41 U/L   ALT 44 0 - 44 U/L   Alkaline Phosphatase 114 38 - 126 U/L   Total Bilirubin 0.5 0.0 - 1.2 mg/dL   GFR, Estimated 39 (L) >60 mL/min    Comment: (NOTE) Calculated using the CKD-EPI Creatinine Equation (2021)    Anion gap 13 5 - 15    Comment: Performed at [facility], [address], [place], [place] [address]  CBC with Differential     Status: Abnormal   Collection Time: 12/07/23  8:12 PM  Result Value Ref Range   WBC 13.1 (H) 4.0 - 10.5 K/uL   RBC 3.19 (L) 4.22 - 5.81 MIL/uL   Hemoglobin 10.0 (L) 13.0 - 17.0 g/dL   HCT 11.9 (L) 14.7 - 82.9 %   MCV 92.5 80.0 - 100.0 fL   MCH 31.3 26.0 - 34.0 pg   MCHC 33.9 30.0 - 36.0 g/dL   RDW 56.2 13.0 - 86.5 %   Platelets 259 150 - 400 K/uL   nRBC 0.0 0.0 - 0.2 %   Neutrophils Relative % 86 %   Neutro Abs 11.2 (H) 1.7 - 7.7 K/uL   Lymphocytes Relative 6 %   Lymphs Abs 0.8 0.7 - 4.0 K/uL   Monocytes Relative 7 %   Monocytes Absolute 0.9 0.1 - 1.0 K/uL   Eosinophils Relative 0 %   Eosinophils Absolute 0.0 0.0 - 0.5 K/uL   Basophils Relative 0 %   Basophils Absolute 0.0 0.0 - 0.1 K/uL   Immature Granulocytes 1 %   Abs Immature Granulocytes 0.11 (H) 0.00 - 0.07 K/uL    Comment: Performed at [facility], [address] [place], [place] [address]  Protime-INR     Status: None   Collection Time: 12/07/23  8:12 PM  Result Value Ref Range   Prothrombin Time 14.6 11.4 - 15.2 seconds   INR 1.1 0.8 - 1.2    Comment: (NOTE) INR goal varies based on device and disease states. Performed at [facility], [address], [place], [place] [address]   I-Stat Lactic Acid, ED     Status: None   Collection Time: 12/07/23  8:27 PM  Result Value Ref Range   Lactic Acid, Venous 0.7 0.5 - 1.9 mmol/L  Culture, blood (Routine x 2)     Status: None   Collection Time: 12/07/23  9:10 PM   Specimen: BLOOD RIGHT ARM  Result Value Ref Range   Specimen Description BLOOD RIGHT ARM    Special Requests      BOTTLES DRAWN AEROBIC AND ANAEROBIC Blood Culture adequate volume   Culture      NO GROWTH 5 DAYS Performed at [facility], [address] [place], [place] [address]    Report Status 12/12/2023 FINAL   Urinalysis, w/ Reflex to Culture (Infection Suspected) -Urine, Clean Catch     Status: Abnormal   Collection  Time: 12/07/23  9:10 PM  Result Value Ref Range   Specimen Source URINE, CATHETERIZED    Color, Urine YELLOW YELLOW   APPearance HAZY (A) CLEAR   Specific Gravity, Urine 1.014 1.005 - 1.030   pH 6.0 5.0 - 8.0   Glucose, UA NEGATIVE NEGATIVE mg/dL   Hgb urine dipstick MODERATE (A) NEGATIVE   Bilirubin Urine NEGATIVE NEGATIVE   Ketones, ur NEGATIVE NEGATIVE mg/dL   Protein, ur >=914 (A) NEGATIVE mg/dL   Nitrite NEGATIVE NEGATIVE   Leukocytes,Ua NEGATIVE NEGATIVE   RBC / HPF 0-5 0 - 5 RBC/hpf   WBC, UA 6-10 0 - 5 WBC/hpf    Comment:        Reflex urine culture not performed if WBC <=10, OR if Squamous epithelial cells >5. If Squamous epithelial cells >5 suggest recollection.    Bacteria, UA RARE (A) NONE SEEN   Squamous Epithelial / HPF 0-5 0 - 5 /HPF   Hyaline Casts, UA PRESENT     Comment: Performed at [facility], [address] [place], [place] [address]  Resp panel by RT-PCR (RSV, Flu A&B, Covid) Anterior Nasal Swab     Status: None   Collection Time: 12/07/23  9:10 PM   Specimen: Anterior Nasal Swab  Result Value Ref Range   SARS Coronavirus 2 by RT PCR NEGATIVE NEGATIVE   Influenza A by PCR NEGATIVE NEGATIVE   Influenza B by PCR NEGATIVE NEGATIVE    Comment: (NOTE) The Xpert Xpress SARS-CoV-2/FLU/RSV plus assay is intended as an aid in the diagnosis of influenza from Nasopharyngeal swab specimens and should not be used as a sole basis for treatment. Nasal washings and aspirates are unacceptable for Xpert Xpress SARS-CoV-2/FLU/RSV testing.  Fact Sheet for Patients: BloggerCourse.com  Fact Sheet for Healthcare Providers: SeriousBroker.it  This test is not yet approved or cleared by the [place]  FDA and has been authorized for detection and/or diagnosis of SARS-CoV-2 by FDA under an Emergency Use Authorization (EUA). This EUA will remain in effect (meaning this test can be used) for the duration of  the COVID-19 declaration under Section 564(b)(1) of the Act, 21 U.S.C. section 360bbb-3(b)(1), unless the authorization is terminated or revoked.  Resp Syncytial Virus by PCR NEGATIVE NEGATIVE    Comment: (NOTE) Fact Sheet for Patients: BloggerCourse.com  Fact Sheet for Healthcare Providers: SeriousBroker.it  This test is not yet approved or cleared by the [place]  FDA and has been authorized for detection and/or diagnosis of SARS-CoV-2 by FDA under an Emergency Use Authorization (EUA). This EUA will remain in effect (meaning this test can be used) for the duration of the COVID-19 declaration under Section 564(b)(1) of the Act, 21 U.S.C. section 360bbb-3(b)(1), unless the authorization is terminated or revoked.  Performed at [facility], [address] [place], [place] [address]   Brain natriuretic peptide     Status: Abnormal   Collection Time: 12/07/23  9:10 PM  Result Value Ref Range   B Natriuretic Peptide 197.3 (H) 0.0 - 100.0 pg/mL    Comment: Performed at [facility], [address], [place], [place] [address]  Troponin I (High Sensitivity)     Status: Abnormal   Collection Time: 12/07/23  9:10 PM  Result Value Ref Range   Troponin I (High Sensitivity) 39 (H) <18 ng/L    Comment: (NOTE) Elevated high sensitivity troponin I (hsTnI) values and significant  changes across serial measurements may suggest ACS but many other  chronic and acute conditions are known to elevate hsTnI results.  Refer to the "Links" section for chest pain algorithms and additional  guidance. Performed at [facility], [address], [place], [place] [address]   I-stat chem 8, ED (not at [facility], [facility] or [facility])     Status: Abnormal   Collection Time: 12/07/23  9:17 PM  Result Value Ref Range   Sodium 131 (L) 135 - 145 mmol/L   Potassium 2.7 (LL) 3.5 - 5.1 mmol/L   Chloride 94 (L) 98 - 111 mmol/L   BUN 25 (H) 8 - 23  mg/dL   Creatinine, Ser 2.13 (H) 0.61 - 1.24 mg/dL   Glucose, Bld 086 (H) 70 - 99 mg/dL    Comment: Glucose reference range applies only to samples taken after fasting for at least 8 hours.   Calcium , Ion 1.24 1.15 - 1.40 mmol/L   TCO2 26 22 - 32 mmol/L   Hemoglobin 10.5 (L) 13.0 - 17.0 g/dL   HCT 57.8 (L) 46.9 - 62.9 %   Comment NOTIFIED PHYSICIAN   Troponin I (High Sensitivity)     Status: Abnormal   Collection Time: 12/07/23 11:15 PM  Result Value Ref Range   Troponin I (High Sensitivity) 35 (H) <18 ng/L    Comment: (NOTE) Elevated high sensitivity troponin I (hsTnI) values and significant  changes across serial measurements may suggest ACS but many other  chronic and acute conditions are known to elevate hsTnI results.  Refer to the "Links" section for chest pain algorithms and additional  guidance. Performed at [facility], [address], [place], [place] [address]   Magnesium      Status: None   Collection Time: 12/07/23 11:15 PM  Result Value Ref Range   Magnesium  1.8 1.7 - 2.4 mg/dL    Comment: Performed at [facility], [address] [place], [place] [address]  Procalcitonin     Status: None   Collection Time: 12/07/23 11:15 PM  Result Value Ref Range   Procalcitonin 0.52 ng/mL    Comment:        Interpretation: PCT > 0.5 ng/mL and <= 2 ng/mL: Systemic infection (sepsis) is possible, but other conditions are known  to elevate PCT as well. (NOTE)       Sepsis PCT Algorithm           Lower Respiratory Tract                                      Infection PCT Algorithm    ----------------------------     ----------------------------         PCT < 0.25 ng/mL                PCT < 0.10 ng/mL          Strongly encourage             Strongly discourage   discontinuation of antibiotics    initiation of antibiotics    ----------------------------     -----------------------------       PCT 0.25 - 0.50 ng/mL            PCT 0.10 - 0.25 ng/mL               OR        >80% decrease in PCT            Discourage initiation of                                            antibiotics      Encourage discontinuation           of antibiotics    ----------------------------     -----------------------------         PCT >= 0.50 ng/mL              PCT 0.26 - 0.50 ng/mL                AND       <80% decrease in PCT             Encourage initiation of                                             antibiotics       Encourage continuation           of antibiotics    ----------------------------     -----------------------------        PCT >= 0.50 ng/mL                  PCT > 0.50 ng/mL               AND         increase in PCT                  Strongly encourage                                      initiation of antibiotics    Strongly encourage escalation           of antibiotics                                     -----------------------------  PCT <= 0.25 ng/mL                                                 OR                                        > 80% decrease in PCT                                      Discontinue / Do not initiate                                             antibiotics  Performed at [facility], [address] [place], [place] [address]   Osmolality     Status: None   Collection Time: 12/07/23 11:15 PM  Result Value Ref Range   Osmolality 283 275 - 295 mOsm/kg    Comment: Performed at [facility], [address], [place], [place] [address]  Vitamin B12     Status: Abnormal   Collection Time: 12/08/23 12:40 AM  Result Value Ref Range   Vitamin B-12 2,584 (H) 180 - 914 pg/mL    Comment: (NOTE) This assay is not validated for testing neonatal or myeloproliferative syndrome specimens for Vitamin B12 levels. Performed at [facility], [address], [place], [place] [address]   Sodium, urine, random     Status: None   Collection Time: 12/08/23 12:43 AM  Result  Value Ref Range   Sodium, Ur <10 mmol/L    Comment: Performed at [facility], [address] [place], [place] [address]  Creatinine, urine, random     Status: None   Collection Time: 12/08/23 12:43 AM  Result Value Ref Range   Creatinine, Urine 21 mg/dL    Comment: Performed at [facility], [address], [place], [place] [address]  Rapid urine drug screen (hospital performed)     Status: Abnormal   Collection Time: 12/08/23 12:43 AM  Result Value Ref Range   Opiates POSITIVE (A) NONE DETECTED   Cocaine NONE DETECTED NONE DETECTED   Benzodiazepines NONE DETECTED NONE DETECTED   Amphetamines NONE DETECTED NONE DETECTED   Tetrahydrocannabinol NONE DETECTED NONE DETECTED   Barbiturates NONE DETECTED NONE DETECTED    Comment: (NOTE) DRUG SCREEN FOR MEDICAL PURPOSES ONLY.  IF CONFIRMATION IS NEEDED FOR ANY PURPOSE, NOTIFY LAB WITHIN 5 DAYS.  LOWEST DETECTABLE LIMITS FOR URINE DRUG SCREEN Drug Class                     Cutoff (ng/mL) Amphetamine and metabolites    1000 Barbiturate and metabolites    200 Benzodiazepine                 200 Opiates and metabolites        300 Cocaine and metabolites        300 THC  50 Performed at [facility], [address] [place], [place] [address]   Osmolality, urine     Status: Abnormal   Collection Time: 12/08/23 12:43 AM  Result Value Ref Range   Osmolality, Ur 110 (L) 300 - 900 mOsm/kg    Comment: Performed at [facility], [address], [place], [place] [address]  Strep pneumoniae urinary antigen     Status: None   Collection Time: 12/08/23 12:55 AM  Result Value Ref Range   Strep Pneumo Urinary Antigen NEGATIVE NEGATIVE    Comment:        Infection due to S. pneumoniae cannot be absolutely ruled out since the antigen present may be below the detection limit of the test. Performed at [facility], [address] [place], [place] [address]   CBC with  Differential/Platelet     Status: Abnormal   Collection Time: 12/08/23  4:42 AM  Result Value Ref Range   WBC 7.8 4.0 - 10.5 K/uL   RBC 2.68 (L) 4.22 - 5.81 MIL/uL   Hemoglobin 8.3 (L) 13.0 - 17.0 g/dL   HCT 91.4 (L) 78.2 - 95.6 %   MCV 95.1 80.0 - 100.0 fL   MCH 31.0 26.0 - 34.0 pg   MCHC 32.5 30.0 - 36.0 g/dL   RDW 21.3 08.6 - 57.8 %   Platelets 201 150 - 400 K/uL   nRBC 0.0 0.0 - 0.2 %   Neutrophils Relative % 92 %   Neutro Abs 7.2 1.7 - 7.7 K/uL   Lymphocytes Relative 5 %   Lymphs Abs 0.4 (L) 0.7 - 4.0 K/uL   Monocytes Relative 2 %   Monocytes Absolute 0.2 0.1 - 1.0 K/uL   Eosinophils Relative 0 %   Eosinophils Absolute 0.0 0.0 - 0.5 K/uL   Basophils Relative 0 %   Basophils Absolute 0.0 0.0 - 0.1 K/uL   Immature Granulocytes 1 %   Abs Immature Granulocytes 0.05 0.00 - 0.07 K/uL    Comment: Performed at [facility], [address], [place], [place] [address]  Comprehensive metabolic panel     Status: Abnormal   Collection Time: 12/08/23  4:42 AM  Result Value Ref Range   Sodium 132 (L) 135 - 145 mmol/L   Potassium 3.6 3.5 - 5.1 mmol/L   Chloride 104 98 - 111 mmol/L   CO2 21 (L) 22 - 32 mmol/L   Glucose, Bld 120 (H) 70 - 99 mg/dL    Comment: Glucose reference range applies only to samples taken after fasting for at least 8 hours.   BUN 20 8 - 23 mg/dL   Creatinine, Ser 9.52 (H) 0.61 - 1.24 mg/dL   Calcium  8.9 8.9 - 10.3 mg/dL   Total Protein 5.5 (L) 6.5 - 8.1 g/dL   Albumin 2.0 (L) 3.5 - 5.0 g/dL   AST 42 (H) 15 - 41 U/L   ALT 33 0 - 44 U/L   Alkaline Phosphatase 85 38 - 126 U/L   Total Bilirubin 0.6 0.0 - 1.2 mg/dL   GFR, Estimated 47 (L) >60 mL/min    Comment: (NOTE) Calculated using the CKD-EPI Creatinine Equation (2021)    Anion gap 7 5 - 15    Comment: Performed at [facility], [address], [place], [place] [address]  Magnesium      Status: None   Collection Time: 12/08/23  4:42 AM  Result Value Ref Range   Magnesium  1.7 1.7 - 2.4 mg/dL  Comment: Performed at [facility], [address], [place], [place] [address]  Ammonia     Status: None   Collection Time: 12/08/23  4:42 AM  Result Value Ref Range   Ammonia 34 9 - 35 umol/L    Comment: HEMOLYSIS AT THIS LEVEL MAY AFFECT RESULT Performed at [facility], [address], [place], [place] [address]   TSH     Status: None   Collection Time: 12/08/23  4:42 AM  Result Value Ref Range   TSH 0.612 0.350 - 4.500 uIU/mL    Comment: Performed by a 3rd Generation assay with a functional sensitivity of <=0.01 uIU/mL. Performed at [facility], [address], [place], [place] [address]   Blood gas, venous     Status: Abnormal   Collection Time: 12/08/23  4:42 AM  Result Value Ref Range   pH, Ven 7.33 7.25 - 7.43   pCO2, Ven 46 44 - 60 mmHg   pO2, Ven 95 (H) 32 - 45 mmHg   Bicarbonate 24.7 20.0 - 28.0 mmol/L   Acid-base deficit 1.8 0.0 - 2.0 mmol/L   O2 Saturation 99.5 %   Patient temperature 36.1    Collection site VENOUS    Drawn by DRAWN BY RN     Comment: Performed at [facility], [address] [place], [place] [address]  Phosphorus     Status: None   Collection Time: 12/08/23  4:42 AM  Result Value Ref Range   Phosphorus 2.5 2.5 - 4.6 mg/dL    Comment: Performed at [facility], [address], [place], [place] [address]  CK     Status: Abnormal   Collection Time: 12/08/23  4:42 AM  Result Value Ref Range   Total CK 414 (H) 49 - 397 U/L    Comment: Performed at [facility], [address], [place], [place] [address]  Protein / creatinine ratio, urine     Status: Abnormal   Collection Time: 12/08/23  5:50 AM  Result Value Ref Range   Creatinine, Urine 22 mg/dL   Total Protein, Urine 68 mg/dL    Comment: NO NORMAL RANGE ESTABLISHED FOR THIS TEST   Protein Creatinine Ratio 3.09 (H) 0.00 - 0.15 mg/mg[Cre]    Comment: Performed at [facility], [address], [place], [place] [address]  ECHOCARDIOGRAM COMPLETE      Status: None   Collection Time: 12/08/23  2:05 PM  Result Value Ref Range   Weight 1,975.32 oz   BP 106/61 mmHg   S' Lateral 2.50 cm   Area-P 1/2 3.85 cm2   Est EF 60 - 65%   CBC with Differential/Platelet     Status: Abnormal   Collection Time: 12/09/23  4:51 AM  Result Value Ref Range   WBC 13.1 (H) 4.0 - 10.5 K/uL   RBC 2.91 (L) 4.22 - 5.81 MIL/uL   Hemoglobin 9.0 (L) 13.0 - 17.0 g/dL   HCT 96.2 (L) 95.2 - 84.1 %   MCV 94.8 80.0 - 100.0 fL   MCH 30.9 26.0 - 34.0 pg   MCHC 32.6 30.0 - 36.0 g/dL   RDW 32.4 40.1 - 02.7 %   Platelets 240 150 - 400 K/uL   nRBC 0.0 0.0 - 0.2 %   Neutrophils Relative % 93 %   Neutro Abs 12.2 (H) 1.7 - 7.7 K/uL   Lymphocytes Relative 3 %   Lymphs Abs 0.4 (L) 0.7 -  4.0 K/uL   Monocytes Relative 3 %   Monocytes Absolute 0.4 0.1 - 1.0 K/uL   Eosinophils Relative 0 %   Eosinophils Absolute 0.0 0.0 - 0.5 K/uL   Basophils Relative 0 %   Basophils Absolute 0.0 0.0 - 0.1 K/uL   Immature Granulocytes 1 %   Abs Immature Granulocytes 0.10 (H) 0.00 - 0.07 K/uL    Comment: Performed at [facility], [address], [place], [place] [address]  Comprehensive metabolic panel     Status: Abnormal   Collection Time: 12/09/23  4:51 AM  Result Value Ref Range   Sodium 137 135 - 145 mmol/L   Potassium 3.0 (L) 3.5 - 5.1 mmol/L   Chloride 105 98 - 111 mmol/L   CO2 22 22 - 32 mmol/L   Glucose, Bld 128 (H) 70 - 99 mg/dL    Comment: Glucose reference range applies only to samples taken after fasting for at least 8 hours.   BUN 23 8 - 23 mg/dL   Creatinine, Ser 6.57 0.61 - 1.24 mg/dL   Calcium  8.9 8.9 - 10.3 mg/dL   Total Protein 5.4 (L) 6.5 - 8.1 g/dL   Albumin 1.9 (L) 3.5 - 5.0 g/dL   AST 36 15 - 41 U/L   ALT 32 0 - 44 U/L   Alkaline Phosphatase 86 38 - 126 U/L   Total Bilirubin 0.4 0.0 - 1.2 mg/dL   GFR, Estimated >84 >69 mL/min    Comment: (NOTE) Calculated using the CKD-EPI Creatinine Equation (2021)    Anion gap 10 5 - 15    Comment: Performed at  [facility], [address], [place], [place] [address]  Magnesium      Status: None   Collection Time: 12/09/23  4:51 AM  Result Value Ref Range   Magnesium  1.8 1.7 - 2.4 mg/dL    Comment: Performed at [facility], [address] [place], [place] [address]  Phosphorus     Status: None   Collection Time: 12/09/23  4:51 AM  Result Value Ref Range   Phosphorus 3.3 2.5 - 4.6 mg/dL    Comment: Performed at [facility], [address], [place], [place] [address]  Basic metabolic panel     Status: Abnormal   Collection Time: 12/10/23  8:21 AM  Result Value Ref Range   Sodium 138 135 - 145 mmol/L   Potassium 3.7 3.5 - 5.1 mmol/L   Chloride 104 98 - 111 mmol/L   CO2 24 22 - 32 mmol/L   Glucose, Bld 154 (H) 70 - 99 mg/dL    Comment: Glucose reference range applies only to samples taken after fasting for at least 8 hours.   BUN 29 (H) 8 - 23 mg/dL   Creatinine, Ser 2.72 0.61 - 1.24 mg/dL   Calcium  9.8 8.9 - 10.3 mg/dL   GFR, Estimated >53 >66 mL/min    Comment: (NOTE) Calculated using the CKD-EPI Creatinine Equation (2021)    Anion gap 10 5 - 15    Comment: Performed at [facility], [address], [place], [place] [address]    Radiology: ECHOCARDIOGRAM COMPLETE Result Date: 12/08/2023    ECHOCARDIOGRAM REPORT   Patient Name:   MICHAIAH HOLSOPPLE Date of Exam: 12/08/2023 Medical Rec #:  [id number]      Height:       68.0 in Accession #:    [id number]     Weight:  123.5 lb Date of Birth:  23-Nov-1945       BSA:          1.665 m Patient Age:    77 years       BP:           106/61 mmHg Patient Gender: M              HR:           60 bpm. Exam Location:  Inpatient Procedure: 2D Echo, Color Doppler and Cardiac Doppler (Both Spectral and Color            Flow Doppler were utilized during procedure). Indications:    Elevated Troponin  History:        Patient has prior history of Echocardiogram examinations, most                 recent 06/09/2023. CAD, COPD; Risk  Factors:Hypertension and                 Diabetes.  Sonographer:    Hersey Lorenzo RDCS Referring Phys: [id number] JUSTIN B HOWERTER IMPRESSIONS  1. Left ventricular ejection fraction, by estimation, is 60 to 65%. The left ventricle has normal function. The left ventricle has no regional wall motion abnormalities. Left ventricular diastolic parameters are consistent with Grade I diastolic dysfunction (impaired relaxation).  2. Right ventricular systolic function is normal. The right ventricular size is normal. There is mildly elevated pulmonary artery systolic pressure.  3. The mitral valve is normal in structure. Trivial mitral valve regurgitation. No evidence of mitral stenosis.  4. Yhe non-coronary cusp is markedly calcied and appears immobile. The aortic valve is tricuspid. There is moderate calcification of the aortic valve. Aortic valve regurgitation is not visualized. No aortic stenosis is present.  5. The inferior vena cava is dilated in size with >50% respiratory variability, suggesting right atrial pressure of 8 mmHg. FINDINGS  Left Ventricle: Left ventricular ejection fraction, by estimation, is 60 to 65%. The left ventricle has normal function. The left ventricle has no regional wall motion abnormalities. Strain imaging was not performed. The left ventricular internal cavity  size was normal in size. There is no left ventricular hypertrophy. Left ventricular diastolic parameters are consistent with Grade I diastolic dysfunction (impaired relaxation). Right Ventricle: The right ventricular size is normal. No increase in right ventricular wall thickness. Right ventricular systolic function is normal. There is mildly elevated pulmonary artery systolic pressure. The tricuspid regurgitant velocity is 2.70  m/s, and with an assumed right atrial pressure of 8 mmHg, the estimated right ventricular systolic pressure is 37.2 mmHg. Left Atrium: Left atrial size was normal in size. Right Atrium: Right atrial size was  normal in size. Pericardium: There is no evidence of pericardial effusion. Mitral Valve: The mitral valve is normal in structure. Trivial mitral valve regurgitation. No evidence of mitral valve stenosis. Tricuspid Valve: The tricuspid valve is normal in structure. Tricuspid valve regurgitation is trivial. No evidence of tricuspid stenosis. Aortic Valve: Yhe non-coronary cusp is markedly calcied and appears immobile. The aortic valve is tricuspid. There is moderate calcification of the aortic valve. Aortic valve regurgitation is not visualized. No aortic stenosis is present. Pulmonic Valve: The pulmonic valve was normal in structure. Pulmonic valve regurgitation is not visualized. No evidence of pulmonic stenosis. Aorta: The aortic root is normal in size and structure. Venous: The inferior vena cava is dilated in size with greater than 50% respiratory variability, suggesting right atrial pressure of 8 mmHg.  IAS/Shunts: No atrial level shunt detected by color flow Doppler. Additional Comments: 3D imaging was not performed.  LEFT VENTRICLE PLAX 2D LVIDd:         4.00 cm   Diastology LVIDs:         2.50 cm   LV e' medial:    9.14 cm/s LV PW:         1.00 cm   LV E/e' medial:  9.9 LV IVS:        1.00 cm   LV e' lateral:   10.00 cm/s LVOT diam:     2.00 cm   LV E/e' lateral: 9.1 LV SV:         70 LV SV Index:   42 LVOT Area:     3.14 cm  RIGHT VENTRICLE             IVC RV S prime:     17.70 cm/s  IVC diam: 2.50 cm TAPSE (M-mode): 2.0 cm LEFT ATRIUM             Index        RIGHT ATRIUM           Index LA Vol (A2C):   47.7 ml 28.65 ml/m  RA Area:     14.50 cm LA Vol (A4C):   37.2 ml 22.34 ml/m  RA Volume:   31.60 ml  18.98 ml/m LA Biplane Vol: 42.4 ml 25.47 ml/m  AORTIC VALVE LVOT Vmax:   78.30 cm/s LVOT Vmean:  51.000 cm/s LVOT VTI:    0.222 m  AORTA Ao Root diam: 3.00 cm Ao Asc diam:  3.30 cm MITRAL VALVE                TRICUSPID VALVE MV Area (PHT): 3.85 cm     TR Peak grad:   29.2 mmHg MV Decel Time: 197 msec      TR Vmax:        270.00 cm/s MV E velocity: 90.80 cm/s MV A velocity: 102.00 cm/s  SHUNTS MV E/A ratio:  0.89         Systemic VTI:  0.22 m                             Systemic Diam: 2.00 cm Jules Oar MD Electronically signed by Jules Oar MD Signature Date/Time: 12/08/2023/3:45:56 PM    Final    CT CHEST ABDOMEN PELVIS WO CONTRAST Result Date: 12/07/2023 CLINICAL DATA:  Fall, sepsis, history of lung cancer EXAM: CT CHEST, ABDOMEN AND PELVIS WITHOUT CONTRAST TECHNIQUE: Multidetector CT imaging of the chest, abdomen and pelvis was performed following the standard protocol without IV contrast. RADIATION DOSE REDUCTION: This exam was performed according to the departmental dose-optimization program which includes automated exposure control, adjustment of the mA and/or kV according to patient size and/or use of iterative reconstruction technique. COMPARISON:  CTA chest dated 08/20/2023 FINDINGS: CT CHEST FINDINGS Cardiovascular: The heart is normal in size. No pericardial effusion. Stable mild leftward cardiomediastinal shift. No evidence of thoracic aortic aneurysm. Atherosclerotic calcifications of the aortic arch. Moderate coronary atherosclerosis of the LAD and right coronary artery. Mediastinum/Nodes: Progressive mediastinal lymphadenopathy, including a dominant 2.4 cm subcarinal nodal mass (series 5/image 35), previously 16 mm. Visualized thyroid is unremarkable. Lungs/Pleura: Biapical pleural-parenchymal scarring. Moderate centrilobular and paraseptal emphysematous changes, upper lung predominant. Dominant 5.3 x 4.1 cm left lower lobe mass (series 7/image 108), previously 4.4 x 3.0 cm, likely corresponding  to the patient's known primary bronchogenic carcinoma. Progressive patchy opacity extending inferiorly to the right lung base (series 7/image 128). Progressive patchy opacity with air bronchograms in the medial right upper lobe (series 7/image 49), favoring chronic scarring and/or post treatment  changes, although infection/pneumonia is also possible. Numerous subpleural patchy/nodular opacities in the lungs bilaterally, new/progressive from the prior. This appearance is unusual for infection given the subpleural distribution. Regardless, this is of little clinical consequence in this patient. Trace bilateral pleural effusions, stable on the left and new on the right. No pneumothorax. Musculoskeletal: Mild degenerative changes of the lower thoracic spine. No fracture is seen. CT ABDOMEN PELVIS FINDINGS Hepatobiliary: Unenhanced liver is unremarkable. Gallbladder is unremarkable. No intrahepatic or extrahepatic ductal dilatation. Pancreas: Within normal limits. Spleen: Within normal limits. Adrenals/Urinary Tract: Adrenal glands are within normal limits. Kidneys are within normal limits. No renal calculi or hydronephrosis. Mildly thick-walled bladder, although underdistended. Stomach/Bowel: Stomach is within normal limits. No evidence of bowel obstruction. Appendix is not discretely visualized Left colonic diverticulosis, without evidence of diverticulitis. Vascular/Lymphatic: No evidence of abdominal aortic aneurysm. Atherosclerotic calcifications of the abdominal aorta and branch vessels. No suspicious abdominopelvic lymphadenopathy. Reproductive: Prostate is unremarkable. Other: No abdominopelvic ascites. Musculoskeletal: Status post PLIF at L1-5. Mild degenerative changes. No fracture is seen. IMPRESSION: No evidence of traumatic injury in the chest, abdomen, or pelvis. Dominant 5.3 cm left lower lobe mass, likely corresponding to the patient's known primary bronchogenic carcinoma. Associated progressive thoracic nodal metastases. Progressive patchy opacity with air bronchograms in the medial right upper lobe, favoring chronic scarring and/or post treatment changes, although infection/pneumonia is also possible. No evidence of metastatic disease in the abdomen/pelvis. Additional ancillary findings as  above. Aortic Atherosclerosis (ICD10-I70.0) and Emphysema (ICD10-J43.9). Electronically Signed   By: Zadie Herter M.D.   On: 12/07/2023 22:59   CT Head Wo Contrast Result Date: 12/07/2023 CLINICAL DATA:  Fall, sepsis EXAM: CT HEAD WITHOUT CONTRAST CT CERVICAL SPINE WITHOUT CONTRAST TECHNIQUE: Multidetector CT imaging of the head and cervical spine was performed following the standard protocol without intravenous contrast. Multiplanar CT image reconstructions of the cervical spine were also generated. RADIATION DOSE REDUCTION: This exam was performed according to the departmental dose-optimization program which includes automated exposure control, adjustment of the mA and/or kV according to patient size and/or use of iterative reconstruction technique. COMPARISON:  MRI brain dated 07/29/2023 FINDINGS: CT HEAD FINDINGS Brain: No evidence of acute infarction, hemorrhage, hydrocephalus, extra-axial collection or mass lesion/mass effect. Subcortical white matter and periventricular small vessel ischemic changes. Vascular: Intracranial atherosclerosis. Skull: Normal. Negative for fracture or focal lesion. Sinuses/Orbits: The visualized paranasal sinuses are essentially clear. The mastoid air cells are unopacified. Other: None. CT CERVICAL SPINE FINDINGS Alignment: Normal cervical lordosis. Skull base and vertebrae: No acute fracture. No primary bone lesion or focal pathologic process. Soft tissues and spinal canal: No prevertebral fluid or swelling. No visible canal hematoma. Disc levels: Mild degenerative changes at C5-6. Spinal canal is patent. Upper chest: Evaluated on dedicated CT chest. Other: None. IMPRESSION: No acute intracranial abnormality. Small vessel ischemic changes. No traumatic injury to the cervical spine. Electronically Signed   By: Zadie Herter M.D.   On: 12/07/2023 22:50   CT Cervical Spine Wo Contrast Result Date: 12/07/2023 CLINICAL DATA:  Fall, sepsis EXAM: CT HEAD WITHOUT CONTRAST  CT CERVICAL SPINE WITHOUT CONTRAST TECHNIQUE: Multidetector CT imaging of the head and cervical spine was performed following the standard protocol without intravenous contrast. Multiplanar CT image reconstructions of the cervical spine  were also generated. RADIATION DOSE REDUCTION: This exam was performed according to the departmental dose-optimization program which includes automated exposure control, adjustment of the mA and/or kV according to patient size and/or use of iterative reconstruction technique. COMPARISON:  MRI brain dated 07/29/2023 FINDINGS: CT HEAD FINDINGS Brain: No evidence of acute infarction, hemorrhage, hydrocephalus, extra-axial collection or mass lesion/mass effect. Subcortical white matter and periventricular small vessel ischemic changes. Vascular: Intracranial atherosclerosis. Skull: Normal. Negative for fracture or focal lesion. Sinuses/Orbits: The visualized paranasal sinuses are essentially clear. The mastoid air cells are unopacified. Other: None. CT CERVICAL SPINE FINDINGS Alignment: Normal cervical lordosis. Skull base and vertebrae: No acute fracture. No primary bone lesion or focal pathologic process. Soft tissues and spinal canal: No prevertebral fluid or swelling. No visible canal hematoma. Disc levels: Mild degenerative changes at C5-6. Spinal canal is patent. Upper chest: Evaluated on dedicated CT chest. Other: None. IMPRESSION: No acute intracranial abnormality. Small vessel ischemic changes. No traumatic injury to the cervical spine. Electronically Signed   By: Zadie Herter M.D.   On: 12/07/2023 22:50   DG Chest 2 View Result Date: 12/07/2023 CLINICAL DATA:  Fall, suspected sepsis EXAM: CHEST - 2 VIEW COMPARISON:  08/20/2023 FINDINGS: Mild left basilar scarring/atelectasis with volume loss. Right lung is clear. Small bilateral pleural effusions. No pneumothorax. The heart is normal in size.  Thoracic aortic atherosclerosis. Exaggerated lower thoracic kyphosis. Lumbar  spine fixation hardware, incompletely visualized. IMPRESSION: Small bilateral pleural effusions. Electronically Signed   By: Zadie Herter M.D.   On: 12/07/2023 20:44   DG Hip Unilat W or Wo Pelvis 2-3 Views Left Result Date: 12/07/2023 CLINICAL DATA:  Fall EXAM: DG HIP (WITH OR WITHOUT PELVIS) 2-3V LEFT COMPARISON:  None Available. FINDINGS: No fracture or dislocation is seen. Bilateral hip joint spaces are preserved. Visualized bony pelvis appears intact. Postsurgical changes involving the lower lumbar spine, incompletely visualized. IMPRESSION: Negative. Electronically Signed   By: Zadie Herter M.D.   On: 12/07/2023 20:43    No results found.  No results found.  Assessment and Plan: Patient Active Problem List   Diagnosis Date Noted   Pneumonia 12/09/2023   Severe sepsis (HCC) 12/08/2023   AKI (acute kidney injury) (HCC) 12/08/2023   Hypokalemia 12/08/2023   Elevated troponin 12/08/2023   Hypercalcemia 12/08/2023   HLD (hyperlipidemia) 12/08/2023   Depression 12/08/2023   Acute metabolic encephalopathy 12/07/2023   Acute bronchitis 08/21/2023   Acute hyponatremia 07/29/2023   Protein-calorie malnutrition, severe 07/29/2023   Weakness 07/28/2023   Headache 07/28/2023   CAP (community acquired pneumonia) 07/28/2023   Hypophosphatemia 06/19/2023   Gastroenteritis due to COVID-19 virus 06/17/2023   Encephalopathy due to COVID-19 virus 06/17/2023   Lung cancer (HCC) 06/04/2023   Preoperative clearance 06/04/2023   Multiple fractures of ribs, left side, initial encounter for closed fracture 01/03/2023   Mediastinal adenopathy 01/03/2023   Aspiration pneumonia (HCC) 01/02/2023   Dementia without behavioral disturbance (HCC) 01/02/2023   GERD without esophagitis 01/02/2023   COPD with acute exacerbation (HCC) 01/02/2023   Alcohol withdrawal delirium (HCC) 01/11/2020   Lobar pneumonia, unspecified organism (HCC) 01/11/2020   Acute hypoxic respiratory failure (HCC)  01/11/2020   Acute pulmonary edema (HCC) 01/11/2020   Toxic metabolic encephalopathy 01/11/2020   Anemia of chronic disease 01/11/2020   Narcotic dependency, continuous (HCC) 01/11/2020   Centrilobular emphysema (HCC) 02/13/2017   Chronic bilateral low back pain with right-sided sciatica 02/13/2017   Gastroesophageal reflux disease 02/13/2017   Major depressive disorder, recurrent, in full  remission (HCC) 02/13/2017   CAD (coronary artery disease) 03/24/2011   Tobacco user 03/24/2011   Pure hypercholesterolemia 03/24/2011    1. SOB (shortness of breath) (Primary)  Patient continues to have symptoms of shortness for breath however he does not really have a desire to quit smoking.  Spoke to at length about dialysis worsening his underlying pulmonary condition as well as on he has recurrence of this tumor Pred there was no desire to quit. - Spirometry with graph - Pulmonary function test; Future  2. Malignant neoplasm of left lung, unspecified part of lung (HCC)   He needs to follow with oncology for this.  Apparently has a recurrence now after 4 years  3. Obstructive chronic bronchitis without exacerbation (HCC)  Continues to smoke patient has severe disease Will continue with inhaler regimens as has been artery discussed.  4. Smoker   Smoking cessation strongly recommended   5. Chronic Pain Syndrome History of back problems on chronic narcotics   General Counseling: I have discussed the findings of the evaluation and examination with Johntae.  I have also discussed any further diagnostic evaluation thatmay be needed or ordered today. Lakeith verbalizes understanding of the findings of todays visit. We also reviewed his medications today and discussed drug interactions and side effects including but not limited excessive drowsiness and altered mental states. We also discussed that there is always a risk not just to him but also people around him. he has been encouraged to call the office with  any questions or concerns that should arise related to todays visit.  Orders Placed This Encounter  Procedures   Spirometry with graph    Where should this test be performed?:   [facility]     Time spent: 55  I have personally obtained a history, examined the patient, evaluated laboratory and imaging results, formulated the assessment and plan and placed orders.    Cordie Deters, MD [facility]

## 2024-01-27 NOTE — Patient Instructions (Signed)

## 2024-02-04 ENCOUNTER — Telehealth: Payer: Self-pay | Admitting: Internal Medicine

## 2024-02-04 NOTE — Telephone Encounter (Signed)
 Left vm and sent mychart message to confirm 02/11/24 appointment-Toni

## 2024-02-11 ENCOUNTER — Encounter: Admitting: Internal Medicine

## 2024-02-17 ENCOUNTER — Other Ambulatory Visit: Payer: Self-pay

## 2024-02-24 ENCOUNTER — Ambulatory Visit: Admitting: Internal Medicine

## 2024-02-25 ENCOUNTER — Encounter: Admitting: Internal Medicine

## 2024-03-09 ENCOUNTER — Ambulatory Visit: Admitting: Internal Medicine

## 2024-04-07 ENCOUNTER — Other Ambulatory Visit: Payer: Self-pay

## 2024-04-07 ENCOUNTER — Emergency Department
Admission: EM | Admit: 2024-04-07 | Discharge: 2024-04-08 | Disposition: A | Discharge status: Home / Self Care | Attending: Emergency Medicine | Admitting: Emergency Medicine

## 2024-04-07 DIAGNOSIS — R0602 Shortness of breath: Secondary | ICD-10-CM | POA: Diagnosis present

## 2024-04-07 DIAGNOSIS — J189 Pneumonia, unspecified organism: Secondary | ICD-10-CM | POA: Insufficient documentation

## 2024-04-07 DIAGNOSIS — J449 Chronic obstructive pulmonary disease, unspecified: Secondary | ICD-10-CM | POA: Diagnosis not present

## 2024-04-07 DIAGNOSIS — R918 Other nonspecific abnormal finding of lung field: Secondary | ICD-10-CM | POA: Insufficient documentation

## 2024-04-07 DIAGNOSIS — I2510 Atherosclerotic heart disease of native coronary artery without angina pectoris: Secondary | ICD-10-CM | POA: Diagnosis not present

## 2024-04-07 DIAGNOSIS — I10 Essential (primary) hypertension: Secondary | ICD-10-CM | POA: Insufficient documentation

## 2024-04-07 LAB — CBC WITH DIFFERENTIAL/PLATELET
Abs Immature Granulocytes: 0.03 10*3/uL (ref 0.00–0.07)
Basophils Absolute: 0 10*3/uL (ref 0.0–0.1)
Basophils Relative: 0 %
Eosinophils Absolute: 0 10*3/uL (ref 0.0–0.5)
Eosinophils Relative: 0 %
HCT: 39.3 % (ref 39.0–52.0)
Hemoglobin: 13.2 g/dL (ref 13.0–17.0)
Immature Granulocytes: 0 %
Lymphocytes Relative: 10 %
Lymphs Abs: 0.7 10*3/uL (ref 0.7–4.0)
MCH: 32.4 pg (ref 26.0–34.0)
MCHC: 33.6 g/dL (ref 30.0–36.0)
MCV: 96.3 fL (ref 80.0–100.0)
Monocytes Absolute: 0.1 10*3/uL (ref 0.1–1.0)
Monocytes Relative: 2 %
Neutro Abs: 5.9 10*3/uL (ref 1.7–7.7)
Neutrophils Relative %: 88 %
Platelets: 207 10*3/uL (ref 150–400)
RBC: 4.08 MIL/uL — ABNORMAL LOW (ref 4.22–5.81)
RDW: 14 % (ref 11.5–15.5)
WBC: 6.7 10*3/uL (ref 4.0–10.5)
nRBC: 0 % (ref 0.0–0.2)

## 2024-04-07 LAB — TROPONIN I (HIGH SENSITIVITY): Troponin I (High Sensitivity): 13 ng/L (ref <18)

## 2024-04-07 LAB — BASIC METABOLIC PANEL WITH GFR
Anion gap: 12 (ref 5–15)
BUN: 19 mg/dL (ref 8–23)
CO2: 24 mmol/L (ref 22–32)
Calcium: 10.5 mg/dL — ABNORMAL HIGH (ref 8.9–10.3)
Chloride: 100 mmol/L (ref 98–111)
Creatinine, Ser: 1.13 mg/dL (ref 0.61–1.24)
GFR, Estimated: >60 mL/min (ref >60)
Glucose, Bld: 106 mg/dL — ABNORMAL HIGH (ref 70–99)
Potassium: 4.3 mmol/L (ref 3.5–5.1)
Sodium: 136 mmol/L (ref 135–145)

## 2024-04-07 NOTE — ED Provider Notes (Signed)
 [facility] Provider Note    Event Date/Time   First MD Initiated Contact with Patient 04/07/24 2259     (approximate)   History   Palpitations   HPI [name] is a 78 y.o. male with history of malignant neoplasm of the lung, CAD, COPD, HTN, HLD presenting today for shortness of breath.  Patient notes over the past 2 to 3 days he has had intermittent shortness of breath, cough, and congestion.  He feels tightness in his chest intermittently.  Denies any other specific chest pain.  Currently states he feels asymptomatic from a chest pain standpoint.  Denies any nausea, vomiting, abdominal pain, leg pain, leg swelling.  Patient went to urgent care earlier today where he was diagnosed with pneumonia.  They thought they noticed an abnormality on his EKG and sent him to the ED for further evaluation.  Patient received ceftriaxone  and dexamethasone  at urgent care.  Patient is still being evaluated outpatient for the potential malignancy in his lung.  Not currently on any chemotherapy or radiation.     Physical Exam   Triage Vital Signs: ED Triage Vitals  Encounter Vitals Group     BP 04/07/24 2146 120/64     Girls Systolic BP Percentile --      Girls Diastolic BP Percentile --      Boys Systolic BP Percentile --      Boys Diastolic BP Percentile --      Pulse Rate 04/07/24 2146 75     Resp 04/07/24 2146 18     Temp 04/07/24 2146 97.9 F (36.6 C)     Temp Source 04/07/24 2146 Oral     SpO2 04/07/24 2146 97 %     Weight 04/07/24 2144 104 lb (47.2 kg)     Height 04/07/24 2144 5' 6 (1.676 m)     Head Circumference --      Peak Flow --      Pain Score 04/07/24 2144 4     Pain Loc --      Pain Education --      Exclude from Growth Chart --     Most recent vital signs: Vitals:   04/07/24 2146  BP: 120/64  Pulse: 75  Resp: 18  Temp: 97.9 F (36.6 C)  SpO2: 97%   Physical Exam: I have reviewed the vital signs and nursing notes. General:  Awake, alert, no acute distress.  Nontoxic appearing. Head:  Atraumatic, normocephalic.   ENT:  EOM intact, PERRL. Oral mucosa is pink and moist with no lesions. Neck: Neck is supple with full range of motion, No meningeal signs. Cardiovascular:  RRR, No murmurs. Peripheral pulses palpable and equal bilaterally. Respiratory:  Symmetrical chest wall expansion.  No rhonchi, rales, or wheezes.  Good air movement throughout.  No use of accessory muscles.   Musculoskeletal:  No cyanosis or edema. Moving extremities with full ROM Abdomen:  Soft, nontender, nondistended. Neuro:  GCS 15, moving all four extremities, interacting appropriately. Speech clear. Psych:  Calm, appropriate.   Skin:  Warm, dry, no rash.    ED Results / Procedures / Treatments   Labs (all labs ordered are listed, but only abnormal results are displayed) Labs Reviewed  CBC WITH DIFFERENTIAL/PLATELET - Abnormal; Notable for the following components:      Result Value   RBC 4.08 (*)    All other components within normal limits  BASIC METABOLIC PANEL WITH GFR - Abnormal; Notable for the following components:  Glucose, Bld 106 (*)    Calcium  10.5 (*)    All other components within normal limits  TROPONIN I (HIGH SENSITIVITY)  TROPONIN I (HIGH SENSITIVITY)     EKG My EKG interpretation: Rate of 73, normal sinus rhythm, normal axis, normal intervals.  No acute ST elevations or depressions   RADIOLOGY Independently interpreted CTA chest showing known lung mass but no other consolidations or blood clots   PROCEDURES:  Critical Care performed: No  Procedures   MEDICATIONS ORDERED IN ED: Medications  iohexol  (OMNIPAQUE ) 350 MG/ML injection 75 mL (75 mLs Intravenous Contrast Given 04/08/24 0033)     IMPRESSION / MDM / ASSESSMENT AND PLAN / ED COURSE  I reviewed the triage vital signs and the nursing notes.                              Differential diagnosis includes, but is not limited to, pneumonia, PE,  viral URI, COPD exacerbation, ACS  Patient's presentation is most consistent with acute complicated illness / injury requiring diagnostic workup.  Patient is an 78 year old male presenting today for cough, congestion, and shortness of breath.  He notes intermittent chest pain although it sounds more like chest tightness with his history of COPD.  Currently he does not have any chest pain at all.  Exam is largely unremarkable with no wheezing present, tachypnea, or hypoxia on room air.  Vital signs are stable.  Laboratory workup with CBC shows no leukocytosis.  BMP largely reassuring.  Initial troponin within normal limits at 13 but will get repeat.  EKG does not show any ischemic findings.  Given his reported lung malignancy and outpatient diagnosis of pneumonia, will get CTA chest to further evaluate for any concerns of PE or more widespread pneumonia or complications related to malignancy.  He is otherwise stable at this time and does not need any immediate interventions.  CTA shows known lung mass but no other complications or concerns such as PE.  Repeat troponin also negative.  Patient's vital signs have remained normal the entire time with no respiratory distress symptoms.  Feel he is comfortable for discharge at this time.  He was already sent Augmentin , azithromycin , and prednisone  for pneumonia and COPD diagnosis earlier today and recommend they pick those up tomorrow morning and take as prescribed.  Told to follow-up with his oncology team for continued evaluation of the lung mass and given strict return precautions.  The patient is on the cardiac monitor to evaluate for evidence of arrhythmia and/or significant heart rate changes. Clinical Course as of 04/08/24 0125  Thu Apr 08, 2024  0115 No new acute findings.  Will discharge with treatment for pneumonia [DW]  0123 Reassessed and still feeling fine at this time.  Otherwise asymptomatic and will discharge [DW]    Clinical Course User  Index [DW] Malvina Alm DASEN, MD     FINAL CLINICAL IMPRESSION(S) / ED DIAGNOSES   Final diagnoses:  Pneumonia due to infectious organism, unspecified laterality, unspecified part of lung  Lung mass     Rx / DC Orders   ED Discharge Orders     None        Note:  This document was prepared using Dragon voice recognition software and may include unintentional dictation errors.   Malvina Alm DASEN, MD 04/08/24 [phone]

## 2024-04-07 NOTE — ED Triage Notes (Signed)
 Pt seen at [facility] and was dx with Pneumonia. Pt reports he has had sob over the past 3 days, pt was told to come here because of an abnormal EKG. Pt states he is having some chest discomfort.

## 2024-04-08 ENCOUNTER — Emergency Department

## 2024-04-08 LAB — TROPONIN I (HIGH SENSITIVITY): Troponin I (High Sensitivity): 11 ng/L (ref <18)

## 2024-04-08 MED ORDER — IOHEXOL 350 MG/ML SOLN
75.0000 mL | Freq: Once | INTRAVENOUS | Status: AC | PRN
  Administered 2024-04-08: 75 mL via INTRAVENOUS

## 2024-04-08 NOTE — Discharge Instructions (Signed)
 Please pick up the medications from the pharmacy that were sent earlier and take as prescribed.  Please return for any severe worsening shortness of breath.

## 2024-04-22 ENCOUNTER — Telehealth: Payer: Self-pay | Admitting: Cardiovascular Disease

## 2024-04-22 NOTE — Telephone Encounter (Signed)
 Pt called in he is supposed to have a biopsy 7/25 and they want him to have echo. Last echo was 12/08/23. Please advise.

## 2024-04-22 NOTE — Telephone Encounter (Signed)
 Pt aware he just had an echo in February and should not need another.  He asked if we can send a copy to Dr. Cena at the [facility] in [place].  Aware I will get echo result faxed to their office.  He appreciates the help. Called Dr. Olan office and obtained fax number [phone]).  Faxed via epic

## 2024-08-16 ENCOUNTER — Telehealth: Payer: Self-pay | Admitting: Cardiovascular Disease

## 2024-08-16 DIAGNOSIS — E7800 Pure hypercholesterolemia, unspecified: Secondary | ICD-10-CM

## 2024-08-16 DIAGNOSIS — I10 Essential (primary) hypertension: Secondary | ICD-10-CM

## 2024-08-16 DIAGNOSIS — I2510 Atherosclerotic heart disease of native coronary artery without angina pectoris: Secondary | ICD-10-CM

## 2024-08-16 MED ORDER — ROSUVASTATIN CALCIUM 40 MG PO TABS: 40.0000 mg | ORAL_TABLET | Freq: Every evening | ORAL | 0 refills | Status: DC

## 2024-08-16 NOTE — Telephone Encounter (Signed)
*  STAT* If patient is at the pharmacy, call can be transferred to refill team.   1. Which medications need to be refilled? (please list name of each medication and dose if known) rosuvastatin  (CRESTOR ) 40 MG tablet    2. Would you like to learn more about the convenience, safety, & potential cost savings by using the [facility]?      3. Are you open to using the [facility] (Type [facility].  ).   4. Which pharmacy/location (including street and city if local pharmacy) is medication to be sent to? [facility] [place], [place] [phone]   5. Do they need a 30 day or 90 day supply? 90 day

## 2024-08-16 NOTE — Telephone Encounter (Signed)
 Pt scheduled to see Dr. Verlin 08/30/24.  [facility]atin  [facility]

## 2024-08-30 ENCOUNTER — Encounter: Payer: Self-pay | Admitting: Cardiovascular Disease

## 2024-08-30 ENCOUNTER — Ambulatory Visit: Attending: Cardiovascular Disease | Admitting: Cardiovascular Disease

## 2024-08-30 VITALS — BP 116/70 | HR 68 | Ht 66.0 in | Wt 110.4 lb

## 2024-08-30 DIAGNOSIS — I2510 Atherosclerotic heart disease of native coronary artery without angina pectoris: Secondary | ICD-10-CM | POA: Diagnosis present

## 2024-08-30 DIAGNOSIS — E7800 Pure hypercholesterolemia, unspecified: Secondary | ICD-10-CM | POA: Insufficient documentation

## 2024-08-30 DIAGNOSIS — I10 Essential (primary) hypertension: Secondary | ICD-10-CM | POA: Insufficient documentation

## 2024-08-30 NOTE — Patient Instructions (Signed)

## 2024-08-30 NOTE — Progress Notes (Signed)
 Chief Complaint  Patient presents with   Follow-up    CAD   History of Present Illness: 78 yo male with history of CAD, tobacco abuse, HTN, lung cancer and COPD here today for cardiac follow up. His cardiac history dates back to 1999 when he had a stent placed in the RCA. Cardiac cath in April 2019 with patent RCA stent, unchanged moderate stenosis in the small to moderate caliber Diagonal branch, mild plaque in the LAD and Circumflex. LVEF=55-65%. Echo August 2024 with LVEF=60-65%, trivial MR. Stress myoview  August 2024 with no ischemia. He has been diagnosed with lung cancer in 2021 and is s/p radiation therapy. This is followed in the [facility].   He is here today for follow up. The patient denies any chest pain, palpitations, lower extremity edema, orthopnea, PND, dizziness, near syncope or syncope.   He is a patent attorney.    Primary Care Physician: [facility]  Past Medical History:  Diagnosis Date   Anemia    Arthritis    Back pain    scoliosis   CAD (coronary artery disease) 03/24/2011   - S/p stent to RCA in 1999  - Cath in 2019 with patent stent and mod nonobstructive disease elsewhere  - Myoview  06/05/23: EF 61, normal perfusion, low risk     COPD (chronic obstructive pulmonary disease) (HCC)    Depression    Diverticulosis    GERD (gastroesophageal reflux disease)    GI bleed    History of colon polyps    History of hiatal hernia    Hyperkalemia    Hyperlipidemia    Hypertension    Lung cancer (HCC) 06/04/2023   Radiation Rx; [facility]    Scoliosis    Tobacco abuse     Past Surgical History:  Procedure Laterality Date   APPENDECTOMY  1957   BACK SURGERY     CARDIAC CATHETERIZATION  1999   stent placement RCA   CARDIAC CATHETERIZATION  2010   mild left CA distal stenosis, mild irregularities in RCA and LAD. patent setn in RCA   COLONOSCOPY WITH PROPOFOL  N/A 06/16/2018   Procedure: COLONOSCOPY WITH PROPOFOL ;  Surgeon: Toledo, Ladell POUR, MD;  Location: [facility] ENDOSCOPY;  Service: Gastroenterology;  Laterality: N/A;   CORONARY ANGIOPLASTY     CORONARY ARTERY BYPASS GRAFT     denies 09/30/19   ESOPHAGOGASTRODUODENOSCOPY (EGD) WITH PROPOFOL  N/A 06/16/2018   Procedure: ESOPHAGOGASTRODUODENOSCOPY (EGD) WITH PROPOFOL ;  Surgeon: Toledo, Ladell POUR, MD;  Location: [facility] ENDOSCOPY;  Service: Gastroenterology;  Laterality: N/A;   FOOT SURGERY  1973   left   LEFT HEART CATH AND CORONARY ANGIOGRAPHY N/A 01/22/2018   Procedure: LEFT HEART CATH AND CORONARY ANGIOGRAPHY;  Surgeon: Verlin Lonni BIRCH, MD;  [facility]: [facility] LAB;  Service: Cardiovascular;  Laterality: N/A;   LEFT HEART CATHETERIZATION WITH CORONARY ANGIOGRAM N/A 01/11/2015   Procedure: LEFT HEART CATHETERIZATION WITH CORONARY ANGIOGRAM;  Surgeon: Lonni BIRCH Verlin, MD;  Location: [facility] CATH LAB;  Service: Cardiovascular;  Laterality: N/A;   OLECRANON BURSECTOMY Right 10/04/2019   Procedure: OLECRANON BURSA;  Surgeon: Tobie Priest, MD;  Location: [facility] ORS;  Service: Orthopedics;  Laterality: Right;   TRICEPS TENDON REPAIR Right 10/04/2019   Procedure: TRICEPS TENDON REPAIR AND PLECRANON BURSA EXCISION, POSSIBLE ALLOGRAFT AUGMENTATION.;  Surgeon: Tobie Priest, MD;  Location: [facility] ORS;  Service: Orthopedics;  Laterality: Right;    Current Outpatient Medications  Medication Sig Dispense Refill   acetaminophen  (TYLENOL ) 500 MG tablet  Take 1 tablet (500 mg total) by mouth every 8 (eight) hours as needed for mild pain (pain score 1-3) or moderate pain (pain score 4-6). 10 tablet 0   amLODipine  (NORVASC ) 10 MG tablet Take 1 tablet (10 mg total) by mouth daily. 30 tablet 0   aspirin  EC 81 MG tablet Take 1 tablet (81 mg total) by mouth daily. Swallow whole. 30 tablet 12   cyanocobalamin  1000 MCG tablet Take 0.5 tablets (500 mcg total) by mouth 2 (two) times daily. VITAMIN B-12 30 tablet 0   donepezil  (ARICEPT ) 10 MG tablet Take 5 mg by mouth daily.     escitalopram  (LEXAPRO )  20 MG tablet Take 1 tablet (20 mg total) by mouth daily. (Patient taking differently: Take 20 mg by mouth in the morning.) 90 tablet 0   esomeprazole  (NEXIUM ) 20 MG capsule Take 20 mg by mouth daily at 12 noon.     folic acid  (FOLVITE ) 1 MG tablet Take 1 mg by mouth daily.     gabapentin  (NEURONTIN ) 400 MG capsule Take 400 mg by mouth 2 (two) times daily.     ibuprofen  (ADVIL ) 800 MG tablet Take 800 mg by mouth every 6 (six) hours as needed for mild pain (pain score 1-3), moderate pain (pain score 4-6) or headache. Takes with Oxy if needed     ipratropium-albuterol  (DUONEB) 0.5-2.5 (3) MG/3ML SOLN Take 3 mLs by nebulization 2 (two) times daily. 360 mL 1   montelukast  (SINGULAIR ) 10 MG tablet TAKE 1 TABLET AT BEDTIME 90 tablet 3   morphine  (MS CONTIN ) 15 MG 12 hr tablet Take 15 mg by mouth 2 (two) times daily.     nitrofurantoin (MACRODANTIN) 100 MG capsule Take 100 mg by mouth 4 (four) times daily.     oxyCODONE -acetaminophen  (PERCOCET) 5-325 MG per tablet Take 2 tablets by mouth in the morning, at noon, and at bedtime. For pain     promethazine  (PHENERGAN ) 25 MG tablet Take 25 mg by mouth every 12 (twelve) hours as needed for nausea.     rosuvastatin  (CRESTOR ) 40 MG tablet Take 1 tablet (40 mg total) by mouth every evening. 30 tablet 0   UNABLE TO FIND Take 1 tablet by mouth daily. Med Name: Valsartan /hydrochlorothiazide 320-25MG  TAB     Vitamin D , Ergocalciferol , (DRISDOL ) 50000 UNITS CAPS capsule Take 50,000 Units by mouth every Wednesday.      No current facility-administered medications for this visit.    Allergies  Allergen Reactions   Levofloxacin Itching and Rash   Doxycycline Nausea And Vomiting    Social History   Socioeconomic History   Marital status: Divorced    Spouse name: Not on file   Number of children: 3   Years of education: Not on file   Highest education level: Not on file  Occupational History    Employer: RETIRED  Tobacco Use   Smoking status: Every Day     Current packs/day: 0.50    Average packs/day: 0.5 packs/day for 35.0 years (17.5 ttl pk-yrs)    Types: Cigarettes   Smokeless tobacco: Never   Tobacco comments:    10 cigarettes daily.  Vaping Use   Vaping status: Never Used  Substance and Sexual Activity   Alcohol use: Yes    Alcohol/week: 6.0 standard drinks of alcohol    Types: 6 Cans of beer per week   Drug use: No   Sexual activity: Not Currently  Other Topics Concern   Not on file  Social History Narrative  Not on file   Social Drivers of Health   Financial Resource Strain: Low Risk  (08/13/2023)   Received from [facility]   Overall Financial Resource Strain (CARDIA)    Difficulty of Paying Living Expenses: Not hard at all  Food Insecurity: No Food Insecurity (12/09/2023)   Hunger Vital Sign    Worried About Running Out of Food in the Last Year: Never true    Ran Out of Food in the Last Year: Never true  Transportation Needs: No Transportation Needs (12/09/2023)   PRAPARE - Administrator, Civil Service (Medical): No    Lack of Transportation (Non-Medical): No  Physical Activity: Not on file  Stress: Not on file  Social Connections: Socially Isolated (12/09/2023)   Social Connection and Isolation Panel    Frequency of Communication with Friends and Family: Three times a week    Frequency of Social Gatherings with Friends and Family: Twice a week    Attends Religious Services: Never    Database Administrator or Organizations: No    Attends Banker Meetings: Never    Marital Status: Divorced  Catering Manager Violence: Not At Risk (12/09/2023)   Humiliation, Afraid, Rape, and Kick questionnaire    Fear of Current or Ex-Partner: No    Emotionally Abused: No    Physically Abused: No    Sexually Abused: No    Family History  Problem Relation Age of Onset   Alzheimer's disease Father    Angina Father    Diabetes Cousin        mothers side   Heart attack Cousin         fathers side   Hyperlipidemia Cousin        fathers side   Hypertension Cousin        fathers side    Review of Systems:  As stated in the HPI and otherwise negative.   BP 116/70   Pulse 68   Ht 5' 6 (1.676 m)   Wt 110 lb 6.4 oz (50.1 kg)   SpO2 91%   BMI 17.82 kg/m   Physical Examination:  General: Well developed, well nourished, NAD  HEENT: OP clear, mucus membranes moist  SKIN: warm, dry. No rashes. Neuro: No focal deficits  Musculoskeletal: Muscle strength 5/5 all ext  Psychiatric: Mood and affect normal  Neck: No JVD, no carotid bruits, no thyromegaly, no lymphadenopathy.  Lungs:Clear bilaterally, no wheezes, rhonci, crackles Cardiovascular: Regular rate and rhythm. No murmurs, gallops or rubs. Abdomen:Soft. Bowel sounds present. Non-tender.  Extremities: No lower extremity edema. Pulses are 2 + in the bilateral DP/PT.  EKG:  EKG is not ordered today. The ekg ordered today demonstrates   Recent Labs: 12/07/2023: B Natriuretic Peptide 197.3 12/08/2023: TSH 0.612 12/09/2023: ALT 32; Magnesium  1.8 04/07/2024: BUN 19; Creatinine, Ser 1.13; Hemoglobin 13.2; Platelets 207; Potassium 4.3; Sodium 136   Lipid Panel Followed in the [facility]   Wt Readings from Last 3 Encounters:  08/30/24 110 lb 6.4 oz (50.1 kg)  04/07/24 104 lb (47.2 kg)  01/27/24 121 lb 9.6 oz (55.2 kg)    Assessment and Plan:   1. CAD without angina:  No chest pain. CAD stable by cardiac cath in April 2019. Continue ASA and statin.    2. Tobacco abuse: Smoking cessation is advised. He does not wish to stop.   3. HTN: BP is well controlled. Continue current therapy  4. Hyperlipidemia: Lip[facility]. Continue [facility]in  Labs/ tests ordered today include:  No orders of the defined types were placed in this encounter.  Disposition:   F/U with me in 12 months  Signed, Lonni Cash, MD 08/30/2024 10:36 AM    [facility] [address] [place],  [place], [place]  [address] Phone: [phone]; Fax: [phone]

## 2024-09-09 ENCOUNTER — Emergency Department

## 2024-09-09 ENCOUNTER — Inpatient Hospital Stay
Admission: EM | Admit: 2024-09-09 | Discharge: 2024-09-14 | DRG: 070 | Disposition: A | Discharge status: Skilled Nursing Facility | Attending: Internal Medicine | Admitting: Internal Medicine

## 2024-09-09 ENCOUNTER — Other Ambulatory Visit: Payer: Self-pay

## 2024-09-09 DIAGNOSIS — W06XXXA Fall from bed, initial encounter: Secondary | ICD-10-CM | POA: Diagnosis present

## 2024-09-09 DIAGNOSIS — G8929 Other chronic pain: Secondary | ICD-10-CM | POA: Diagnosis present

## 2024-09-09 DIAGNOSIS — G9341 Metabolic encephalopathy: Principal | ICD-10-CM | POA: Diagnosis present

## 2024-09-09 DIAGNOSIS — I160 Hypertensive urgency: Secondary | ICD-10-CM | POA: Diagnosis present

## 2024-09-09 DIAGNOSIS — F32A Depression, unspecified: Secondary | ICD-10-CM | POA: Diagnosis present

## 2024-09-09 DIAGNOSIS — Z9861 Coronary angioplasty status: Secondary | ICD-10-CM

## 2024-09-09 DIAGNOSIS — Z7982 Long term (current) use of aspirin: Secondary | ICD-10-CM

## 2024-09-09 DIAGNOSIS — I672 Cerebral atherosclerosis: Secondary | ICD-10-CM | POA: Diagnosis present

## 2024-09-09 DIAGNOSIS — G9340 Encephalopathy, unspecified: Secondary | ICD-10-CM

## 2024-09-09 DIAGNOSIS — Z681 Body mass index (BMI) 19 or less, adult: Secondary | ICD-10-CM

## 2024-09-09 DIAGNOSIS — S92425B Nondisplaced fracture of distal phalanx of left great toe, initial encounter for open fracture: Secondary | ICD-10-CM

## 2024-09-09 DIAGNOSIS — Z833 Family history of diabetes mellitus: Secondary | ICD-10-CM

## 2024-09-09 DIAGNOSIS — J209 Acute bronchitis, unspecified: Secondary | ICD-10-CM | POA: Diagnosis present

## 2024-09-09 DIAGNOSIS — F17210 Nicotine dependence, cigarettes, uncomplicated: Secondary | ICD-10-CM | POA: Diagnosis present

## 2024-09-09 DIAGNOSIS — J22 Unspecified acute lower respiratory infection: Secondary | ICD-10-CM

## 2024-09-09 DIAGNOSIS — T502X5A Adverse effect of carbonic-anhydrase inhibitors, benzothiadiazides and other diuretics, initial encounter: Secondary | ICD-10-CM | POA: Diagnosis present

## 2024-09-09 DIAGNOSIS — I2510 Atherosclerotic heart disease of native coronary artery without angina pectoris: Secondary | ICD-10-CM | POA: Diagnosis present

## 2024-09-09 DIAGNOSIS — M25512 Pain in left shoulder: Secondary | ICD-10-CM | POA: Diagnosis present

## 2024-09-09 DIAGNOSIS — F1120 Opioid dependence, uncomplicated: Secondary | ICD-10-CM | POA: Diagnosis present

## 2024-09-09 DIAGNOSIS — Z881 Allergy status to other antibiotic agents status: Secondary | ICD-10-CM

## 2024-09-09 DIAGNOSIS — Y92003 Bedroom of unspecified non-institutional (private) residence as the place of occurrence of the external cause: Secondary | ICD-10-CM

## 2024-09-09 DIAGNOSIS — M419 Scoliosis, unspecified: Secondary | ICD-10-CM | POA: Diagnosis present

## 2024-09-09 DIAGNOSIS — R54 Age-related physical debility: Secondary | ICD-10-CM | POA: Diagnosis present

## 2024-09-09 DIAGNOSIS — J439 Emphysema, unspecified: Secondary | ICD-10-CM | POA: Diagnosis present

## 2024-09-09 DIAGNOSIS — Z85118 Personal history of other malignant neoplasm of bronchus and lung: Secondary | ICD-10-CM

## 2024-09-09 DIAGNOSIS — R718 Other abnormality of red blood cells: Secondary | ICD-10-CM | POA: Diagnosis present

## 2024-09-09 DIAGNOSIS — F0153 Vascular dementia, unspecified severity, with mood disturbance: Secondary | ICD-10-CM | POA: Diagnosis present

## 2024-09-09 DIAGNOSIS — E785 Hyperlipidemia, unspecified: Secondary | ICD-10-CM | POA: Diagnosis present

## 2024-09-09 DIAGNOSIS — Z951 Presence of aortocoronary bypass graft: Secondary | ICD-10-CM

## 2024-09-09 DIAGNOSIS — M75102 Unspecified rotator cuff tear or rupture of left shoulder, not specified as traumatic: Secondary | ICD-10-CM | POA: Diagnosis present

## 2024-09-09 DIAGNOSIS — Z716 Tobacco abuse counseling: Secondary | ICD-10-CM

## 2024-09-09 DIAGNOSIS — M1990 Unspecified osteoarthritis, unspecified site: Secondary | ICD-10-CM | POA: Diagnosis present

## 2024-09-09 DIAGNOSIS — R4182 Altered mental status, unspecified: Principal | ICD-10-CM

## 2024-09-09 DIAGNOSIS — J410 Simple chronic bronchitis: Secondary | ICD-10-CM | POA: Diagnosis present

## 2024-09-09 DIAGNOSIS — K219 Gastro-esophageal reflux disease without esophagitis: Secondary | ICD-10-CM | POA: Diagnosis present

## 2024-09-09 DIAGNOSIS — S99922A Unspecified injury of left foot, initial encounter: Secondary | ICD-10-CM

## 2024-09-09 DIAGNOSIS — Z79899 Other long term (current) drug therapy: Secondary | ICD-10-CM

## 2024-09-09 DIAGNOSIS — I10 Essential (primary) hypertension: Secondary | ICD-10-CM | POA: Diagnosis present

## 2024-09-09 DIAGNOSIS — J470 Bronchiectasis with acute lower respiratory infection: Secondary | ICD-10-CM | POA: Diagnosis present

## 2024-09-09 DIAGNOSIS — J90 Pleural effusion, not elsewhere classified: Secondary | ICD-10-CM | POA: Diagnosis present

## 2024-09-09 DIAGNOSIS — E876 Hypokalemia: Secondary | ICD-10-CM | POA: Diagnosis present

## 2024-09-09 DIAGNOSIS — J449 Chronic obstructive pulmonary disease, unspecified: Secondary | ICD-10-CM | POA: Insufficient documentation

## 2024-09-09 DIAGNOSIS — Z860100 Personal history of colon polyps, unspecified: Secondary | ICD-10-CM

## 2024-09-09 DIAGNOSIS — J440 Chronic obstructive pulmonary disease with acute lower respiratory infection: Secondary | ICD-10-CM | POA: Diagnosis present

## 2024-09-09 DIAGNOSIS — E43 Unspecified severe protein-calorie malnutrition: Secondary | ICD-10-CM | POA: Diagnosis present

## 2024-09-09 DIAGNOSIS — S91202A Unspecified open wound of left great toe with damage to nail, initial encounter: Secondary | ICD-10-CM | POA: Diagnosis present

## 2024-09-09 DIAGNOSIS — Z8249 Family history of ischemic heart disease and other diseases of the circulatory system: Secondary | ICD-10-CM

## 2024-09-09 LAB — CBC WITH DIFFERENTIAL/PLATELET
Abs Immature Granulocytes: 0.1 K/uL — ABNORMAL HIGH (ref 0.00–0.07)
Basophils Absolute: 0 K/uL (ref 0.0–0.1)
Basophils Relative: 0 %
Eosinophils Absolute: 0 K/uL (ref 0.0–0.5)
Eosinophils Relative: 0 %
HCT: 29.9 % — ABNORMAL LOW (ref 39.0–52.0)
Hemoglobin: 9.7 g/dL — ABNORMAL LOW (ref 13.0–17.0)
Immature Granulocytes: 2 %
Lymphocytes Relative: 7 %
Lymphs Abs: 0.4 K/uL — ABNORMAL LOW (ref 0.7–4.0)
MCH: 34.2 pg — ABNORMAL HIGH (ref 26.0–34.0)
MCHC: 32.4 g/dL (ref 30.0–36.0)
MCV: 105.3 fL — ABNORMAL HIGH (ref 80.0–100.0)
Monocytes Absolute: 0.5 K/uL (ref 0.1–1.0)
Monocytes Relative: 9 %
Neutro Abs: 4.7 K/uL (ref 1.7–7.7)
Neutrophils Relative %: 82 %
Platelets: 175 K/uL (ref 150–400)
RBC: 2.84 MIL/uL — ABNORMAL LOW (ref 4.22–5.81)
RDW: 13.7 % (ref 11.5–15.5)
WBC: 5.7 K/uL (ref 4.0–10.5)
nRBC: 0 % (ref 0.0–0.2)

## 2024-09-09 LAB — URINE DRUG SCREEN
Amphetamines: NEGATIVE
Barbiturates: NEGATIVE
Benzodiazepines: NEGATIVE
Cocaine: NEGATIVE
Fentanyl: NEGATIVE
Methadone Scn, Ur: NEGATIVE
Opiates: POSITIVE — AB
Tetrahydrocannabinol: NEGATIVE

## 2024-09-09 LAB — BLOOD GAS, VENOUS
Acid-base deficit: 1.3 mmol/L (ref 0.0–2.0)
Bicarbonate: 23.7 mmol/L (ref 20.0–28.0)
O2 Saturation: 89.8 %
Patient temperature: 37
pCO2, Ven: 40 mmHg — ABNORMAL LOW (ref 44–60)
pH, Ven: 7.38 (ref 7.25–7.43)
pO2, Ven: 57 mmHg — ABNORMAL HIGH (ref 32–45)

## 2024-09-09 LAB — URINALYSIS, W/ REFLEX TO CULTURE (INFECTION SUSPECTED)
Bacteria, UA: NONE SEEN
Bilirubin Urine: NEGATIVE
Glucose, UA: NEGATIVE mg/dL
Ketones, ur: NEGATIVE mg/dL
Leukocytes,Ua: NEGATIVE
Nitrite: NEGATIVE
Protein, ur: 100 mg/dL — AB
Specific Gravity, Urine: 1.016 (ref 1.005–1.030)
Squamous Epithelial / HPF: 0 /HPF (ref 0–5)
pH: 7 (ref 5.0–8.0)

## 2024-09-09 LAB — COMPREHENSIVE METABOLIC PANEL WITH GFR
ALT: 9 U/L (ref 0–44)
AST: 26 U/L (ref 15–41)
Albumin: 3.2 g/dL — ABNORMAL LOW (ref 3.5–5.0)
Alkaline Phosphatase: 102 U/L (ref 38–126)
Anion gap: 11 (ref 5–15)
BUN: 15 mg/dL (ref 8–23)
CO2: 25 mmol/L (ref 22–32)
Calcium: 9.2 mg/dL (ref 8.9–10.3)
Chloride: 100 mmol/L (ref 98–111)
Creatinine, Ser: 1.04 mg/dL (ref 0.61–1.24)
GFR, Estimated: >60 mL/min (ref >60)
Glucose, Bld: 88 mg/dL (ref 70–99)
Potassium: 3.5 mmol/L (ref 3.5–5.1)
Sodium: 135 mmol/L (ref 135–145)
Total Bilirubin: 0.5 mg/dL (ref 0.0–1.2)
Total Protein: 7.2 g/dL (ref 6.5–8.1)

## 2024-09-09 LAB — LACTIC ACID, PLASMA: Lactic Acid, Venous: 1.5 mmol/L (ref 0.5–1.9)

## 2024-09-09 MED ORDER — LACTATED RINGERS IV BOLUS
1000.0000 mL | Freq: Once | INTRAVENOUS | Status: AC
  Administered 2024-09-09: 1000 mL via INTRAVENOUS

## 2024-09-09 NOTE — ED Triage Notes (Signed)
 Pt BIB [facility] from home for roll out of bed, EMS reports this was unwitnessed and occurred 3H ago. Unknown LOC, head strike or thinners. Hx Lung Ca. Pt is alerted to self only. Pt arrives from home covered in feces and urine. EMS reports finding a stack of paper towels in groin area presumably to catch urine. EMS reports family at the home states pt's daughter is the caregiver, was at a Thanksgiving dinner and didn't want to be bothered with this.

## 2024-09-09 NOTE — ED Notes (Signed)
 Pt's soiled clothing removed and kept at bedside. Pt bathed and cleaned of stool and urine. Gown and slip proof socks applied. Fall bundle in place.

## 2024-09-09 NOTE — ED Provider Notes (Signed)
 [facility] Provider Note    Event Date/Time   First MD Initiated Contact with Patient 09/09/24 1926     (approximate)   History   Fall   HPI  Owens R Dorris is a 78 y.o. male who presents to the emergency department today because concerns for fall and altered mental status.  Patient himself is unable to give any significant history.  Per EMS report the patient rolled out of bed.  There is also concern for possible care issues.  Patient's daughter did arrive to the emergency department.  I was able to get some further history at her bedside.  This daughter had been out of town for the past few days.  Had last seen her father few days ago when he was in his normal state of health.  She states normally he is active and alert.  Today's presentation is a significant change from patient's baseline.     Physical Exam   Triage Vital Signs: ED Triage Vitals  Encounter Vitals Group     BP 09/09/24 2011 (!) 170/81     Girls Systolic BP Percentile --      Girls Diastolic BP Percentile --      Boys Systolic BP Percentile --      Boys Diastolic BP Percentile --      Pulse Rate 09/09/24 2011 (!) 57     Resp 09/09/24 2011 (!) 21     Temp 09/09/24 2011 97.6 F (36.4 C)     Temp Source 09/09/24 2011 Rectal     SpO2 09/09/24 2011 98 %     Weight 09/09/24 2013 110 lb 14.3 oz (50.3 kg)     Height 09/09/24 2013 5' 6 (1.676 m)     Head Circumference --      Peak Flow --      Pain Score 09/09/24 2012 0     Pain Loc --      Pain Education --      Exclude from Growth Chart --     Most recent vital signs: Vitals:   09/09/24 2011  BP: (!) 170/81  Pulse: (!) 57  Resp: (!) 21  Temp: 97.6 F (36.4 C)  SpO2: 98%   General: Somnolent, awakens to loud verbal stimuli. CV:  Good peripheral perfusion. Bradycardia. Resp:  Slightly increased work of breathing. Abd:  No distention.    ED Results / Procedures / Treatments   Labs (all labs ordered are listed, but  only abnormal results are displayed) Labs Reviewed  COMPREHENSIVE METABOLIC PANEL WITH GFR - Abnormal; Notable for the following components:      Result Value   Albumin 3.2 (*)    All other components within normal limits  CBC WITH DIFFERENTIAL/PLATELET - Abnormal; Notable for the following components:   RBC 2.84 (*)    Hemoglobin 9.7 (*)    HCT 29.9 (*)    MCV 105.3 (*)    MCH 34.2 (*)    Lymphs Abs 0.4 (*)    Abs Immature Granulocytes 0.10 (*)    All other components within normal limits  URINE DRUG SCREEN - Abnormal; Notable for the following components:   Opiates POSITIVE (*)    All other components within normal limits  CULTURE, BLOOD (ROUTINE X 2)  CULTURE, BLOOD (ROUTINE X 2)  LACTIC ACID, PLASMA  URINALYSIS, W/ REFLEX TO CULTURE (INFECTION SUSPECTED)  BLOOD GAS, VENOUS     EKG  None   RADIOLOGY I independently interpreted and  visualized the CT head. My interpretation: No ICH Radiology interpretation:  IMPRESSION:  1. Limited study due to patient motion.  2. No acute intracranial process.    I independently interpreted and visualized the CT cervical spine. My interpretation: No fracture Radiology interpretation:  IMPRESSION:  1. Limited study due to patient motion.  2. No acute cervical spine fracture.    I independently interpreted and visualized the CXR. My interpretation: No fracture Radiology interpretation:  IMPRESSION:  1. New small left pleural effusion.  2. Cavitary mass in the left lung base behind the heart, seen on prior CT, is  obscured on today's radiograph.  3. Diffuse interstitial pattern with bronchiectasis, interstitial  reticulonodular changes, and coarse infiltrates, unchanged since prior study,  consistent with emphysema and chronic interstitial lung disease.     PROCEDURES:  Critical Care performed: Yes  CRITICAL CARE Performed by: Guadalupe Eagles   Total critical care time: 35 minutes  Critical care time was exclusive of  separately billable procedures and treating other patients.  Critical care was necessary to treat or prevent imminent or life-threatening deterioration.  Critical care was time spent personally by me on the following activities: development of treatment plan with patient and/or surrogate as well as nursing, discussions with consultants, evaluation of patient's response to treatment, examination of patient, obtaining history from patient or surrogate, ordering and performing treatments and interventions, ordering and review of laboratory studies, ordering and review of radiographic studies, pulse oximetry and re-evaluation of patient's condition.   Procedures    MEDICATIONS ORDERED IN ED: Medications - No data to display   IMPRESSION / MDM / ASSESSMENT AND PLAN / ED COURSE  I reviewed the triage vital signs and the nursing notes.                              Differential diagnosis includes, but is not limited to, UTI, pneumonia, ICH, medication misadventure, electrolyte abnormality, anemia  Patient's presentation is most consistent with acute presentation with potential threat to life or bodily function.   The patient is on the cardiac monitor to evaluate for evidence of arrhythmia and/or significant heart rate changes.  Patient presented to the emergency department today brought in by EMS because of concerns for altered mental status and fall.  Patient is somnolent upon arrival although does awaken to loud verbal stimuli.  Primary history obtained from family who presented to bedside shortly after patient's arrival.  Broad workup for altered mental status was sent.  Head CT without any concerning abnormalities.  Chest x-ray without pneumonia.  Blood work without any significant leukocytosis or lactic acid elevation. Will check VBG given concern for possible hypercapnia. On reassessment patient does appear more awake, opening eyes spontaneously.   VBG without concerning hypercapnia.  No  acidosis.  At this time somewhat unclear etiology of the patient's altered mental sounds.  However his mentation does appear to be improving here in the emergency department.  I do wonder if medication misadventure is a possibility.  However given continued altered mental status I do think he would benefit from further workup and evaluation.  Will discuss with the hospitalist service for admission.   FINAL CLINICAL IMPRESSION(S) / ED DIAGNOSES   Final diagnoses:  Altered mental status, unspecified altered mental status type      Note:  This document was prepared using Dragon voice recognition software and may include unintentional dictation errors.    Eagles Guadalupe, MD 09/09/24 2352

## 2024-09-10 ENCOUNTER — Observation Stay

## 2024-09-10 ENCOUNTER — Encounter: Payer: Self-pay | Admitting: Family Medicine

## 2024-09-10 DIAGNOSIS — S99922A Unspecified injury of left foot, initial encounter: Secondary | ICD-10-CM | POA: Diagnosis not present

## 2024-09-10 DIAGNOSIS — E785 Hyperlipidemia, unspecified: Secondary | ICD-10-CM | POA: Diagnosis not present

## 2024-09-10 DIAGNOSIS — I160 Hypertensive urgency: Secondary | ICD-10-CM

## 2024-09-10 DIAGNOSIS — G9340 Encephalopathy, unspecified: Secondary | ICD-10-CM | POA: Diagnosis not present

## 2024-09-10 DIAGNOSIS — S92425B Nondisplaced fracture of distal phalanx of left great toe, initial encounter for open fracture: Secondary | ICD-10-CM

## 2024-09-10 DIAGNOSIS — J449 Chronic obstructive pulmonary disease, unspecified: Secondary | ICD-10-CM | POA: Insufficient documentation

## 2024-09-10 DIAGNOSIS — J441 Chronic obstructive pulmonary disease with (acute) exacerbation: Secondary | ICD-10-CM | POA: Diagnosis not present

## 2024-09-10 DIAGNOSIS — J22 Unspecified acute lower respiratory infection: Secondary | ICD-10-CM

## 2024-09-10 DIAGNOSIS — G8929 Other chronic pain: Secondary | ICD-10-CM | POA: Insufficient documentation

## 2024-09-10 DIAGNOSIS — F0153 Vascular dementia, unspecified severity, with mood disturbance: Secondary | ICD-10-CM | POA: Insufficient documentation

## 2024-09-10 LAB — CBC
HCT: 29.6 % — ABNORMAL LOW (ref 39.0–52.0)
Hemoglobin: 10.1 g/dL — ABNORMAL LOW (ref 13.0–17.0)
MCH: 34.2 pg — ABNORMAL HIGH (ref 26.0–34.0)
MCHC: 34.1 g/dL (ref 30.0–36.0)
MCV: 100.3 fL — ABNORMAL HIGH (ref 80.0–100.0)
Platelets: 167 K/uL (ref 150–400)
RBC: 2.95 MIL/uL — ABNORMAL LOW (ref 4.22–5.81)
RDW: 13.5 % (ref 11.5–15.5)
WBC: 5.2 K/uL (ref 4.0–10.5)
nRBC: 0 % (ref 0.0–0.2)

## 2024-09-10 LAB — BASIC METABOLIC PANEL WITH GFR
Anion gap: 13 (ref 5–15)
BUN: 14 mg/dL (ref 8–23)
CO2: 23 mmol/L (ref 22–32)
Calcium: 8.9 mg/dL (ref 8.9–10.3)
Chloride: 102 mmol/L (ref 98–111)
Creatinine, Ser: 0.91 mg/dL (ref 0.61–1.24)
GFR, Estimated: >60 mL/min (ref >60)
Glucose, Bld: 88 mg/dL (ref 70–99)
Potassium: 3.2 mmol/L — ABNORMAL LOW (ref 3.5–5.1)
Sodium: 137 mmol/L (ref 135–145)

## 2024-09-10 LAB — PROCALCITONIN: Procalcitonin: 0.33 ng/mL

## 2024-09-10 MED ORDER — DONEPEZIL HCL 5 MG PO TABS
5.0000 mg | ORAL_TABLET | Freq: Every day | ORAL | Status: DC
  Administered 2024-09-10 – 2024-09-14 (×5): 5 mg via ORAL
  Filled 2024-09-10 (×5): qty 1

## 2024-09-10 MED ORDER — ENSURE PLUS HIGH PROTEIN PO LIQD
237.0000 mL | Freq: Three times a day (TID) | ORAL | Status: DC
  Administered 2024-09-11 – 2024-09-14 (×8): 237 mL via ORAL

## 2024-09-10 MED ORDER — HYDROCOD POLI-CHLORPHE POLI ER 10-8 MG/5ML PO SUER: 5.0000 mL | Freq: Two times a day (BID) | ORAL | Status: DC | PRN

## 2024-09-10 MED ORDER — ONDANSETRON HCL 4 MG PO TABS: 4.0000 mg | ORAL_TABLET | Freq: Four times a day (QID) | ORAL | Status: DC | PRN

## 2024-09-10 MED ORDER — TRIPLE ANTIBIOTIC 3.5-400-5000 EX OINT
1.0000 | TOPICAL_OINTMENT | Freq: Every day | CUTANEOUS | Status: DC
  Administered 2024-09-11 – 2024-09-14 (×4): 1 via CUTANEOUS
  Filled 2024-09-10 (×5): qty 1

## 2024-09-10 MED ORDER — MIRTAZAPINE 15 MG PO TABS: 7.5000 mg | ORAL_TABLET | Freq: Every day | ORAL | Status: DC

## 2024-09-10 MED ORDER — SODIUM CHLORIDE 0.9 % IV SOLN: INTRAVENOUS | Status: DC

## 2024-09-10 MED ORDER — ACETAMINOPHEN 325 MG PO TABS: 650.0000 mg | ORAL_TABLET | Freq: Four times a day (QID) | ORAL | Status: DC | PRN

## 2024-09-10 MED ORDER — GUAIFENESIN ER 600 MG PO TB12
600.0000 mg | ORAL_TABLET | Freq: Two times a day (BID) | ORAL | Status: DC
  Administered 2024-09-10 – 2024-09-14 (×10): 600 mg via ORAL
  Filled 2024-09-10 (×10): qty 1

## 2024-09-10 MED ORDER — SULFAMETHOXAZOLE-TRIMETHOPRIM 800-160 MG PO TABS
1.0000 | ORAL_TABLET | Freq: Two times a day (BID) | ORAL | Status: DC
  Administered 2024-09-10 – 2024-09-11 (×2): 1 via ORAL
  Filled 2024-09-10 (×2): qty 1

## 2024-09-10 MED ORDER — LIDOCAINE HCL 1 % IJ SOLN
10.0000 mL | Freq: Once | INTRAMUSCULAR | Status: DC
  Filled 2024-09-10: qty 10

## 2024-09-10 MED ORDER — ONDANSETRON HCL 4 MG/2ML IJ SOLN
4.0000 mg | Freq: Four times a day (QID) | INTRAMUSCULAR | Status: DC | PRN
  Administered 2024-09-10 – 2024-09-12 (×2): 4 mg via INTRAVENOUS
  Filled 2024-09-10 (×2): qty 2

## 2024-09-10 MED ORDER — PANTOPRAZOLE SODIUM 40 MG PO TBEC
40.0000 mg | DELAYED_RELEASE_TABLET | Freq: Every day | ORAL | Status: DC
  Administered 2024-09-10 – 2024-09-14 (×5): 40 mg via ORAL
  Filled 2024-09-10 (×5): qty 1

## 2024-09-10 MED ORDER — ASPIRIN 81 MG PO TBEC
81.0000 mg | DELAYED_RELEASE_TABLET | Freq: Every day | ORAL | Status: DC
  Administered 2024-09-10 – 2024-09-14 (×5): 81 mg via ORAL
  Filled 2024-09-10 (×5): qty 1

## 2024-09-10 MED ORDER — TRAZODONE HCL 50 MG PO TABS
25.0000 mg | ORAL_TABLET | Freq: Every evening | ORAL | Status: DC | PRN
  Administered 2024-09-12: 25 mg via ORAL
  Filled 2024-09-10: qty 1

## 2024-09-10 MED ORDER — ENOXAPARIN SODIUM 40 MG/0.4ML IJ SOSY
40.0000 mg | PREFILLED_SYRINGE | INTRAMUSCULAR | Status: DC
  Administered 2024-09-10 – 2024-09-13 (×4): 40 mg via SUBCUTANEOUS
  Filled 2024-09-10 (×4): qty 0.4

## 2024-09-10 MED ORDER — MONTELUKAST SODIUM 10 MG PO TABS
10.0000 mg | ORAL_TABLET | Freq: Every day | ORAL | Status: DC
  Administered 2024-09-10 – 2024-09-13 (×5): 10 mg via ORAL
  Filled 2024-09-10 (×5): qty 1

## 2024-09-10 MED ORDER — SODIUM CHLORIDE 0.9 % IV SOLN
500.0000 mg | INTRAVENOUS | Status: DC
  Administered 2024-09-10: 500 mg via INTRAVENOUS
  Filled 2024-09-10: qty 5

## 2024-09-10 MED ORDER — LABETALOL HCL 5 MG/ML IV SOLN: 20.0000 mg | INTRAVENOUS | Status: DC | PRN

## 2024-09-10 MED ORDER — POTASSIUM CHLORIDE 20 MEQ PO PACK
40.0000 meq | PACK | Freq: Once | ORAL | Status: AC
  Administered 2024-09-10: 40 meq via ORAL
  Filled 2024-09-10: qty 2

## 2024-09-10 MED ORDER — FOLIC ACID 1 MG PO TABS
1.0000 mg | ORAL_TABLET | Freq: Every day | ORAL | Status: DC
  Administered 2024-09-10 – 2024-09-14 (×5): 1 mg via ORAL
  Filled 2024-09-10 (×5): qty 1

## 2024-09-10 MED ORDER — ROSUVASTATIN CALCIUM 10 MG PO TABS
40.0000 mg | ORAL_TABLET | Freq: Every evening | ORAL | Status: DC
  Administered 2024-09-10 – 2024-09-13 (×4): 40 mg via ORAL
  Filled 2024-09-10: qty 2
  Filled 2024-09-10: qty 4
  Filled 2024-09-10: qty 2
  Filled 2024-09-10 (×3): qty 4

## 2024-09-10 MED ORDER — IRBESARTAN 150 MG PO TABS
300.0000 mg | ORAL_TABLET | Freq: Every day | ORAL | Status: DC
  Administered 2024-09-10 – 2024-09-14 (×4): 300 mg via ORAL
  Filled 2024-09-10 (×4): qty 2

## 2024-09-10 MED ORDER — AZITHROMYCIN 250 MG PO TABS
500.0000 mg | ORAL_TABLET | Freq: Every day | ORAL | Status: DC
  Administered 2024-09-11: 500 mg via ORAL
  Filled 2024-09-10: qty 2

## 2024-09-10 MED ORDER — ACETAMINOPHEN 650 MG RE SUPP: 650.0000 mg | Freq: Four times a day (QID) | RECTAL | Status: DC | PRN

## 2024-09-10 MED ORDER — ESCITALOPRAM OXALATE 10 MG PO TABS
20.0000 mg | ORAL_TABLET | Freq: Every morning | ORAL | Status: DC
  Administered 2024-09-10 – 2024-09-14 (×5): 20 mg via ORAL
  Filled 2024-09-10 (×5): qty 2

## 2024-09-10 MED ORDER — IPRATROPIUM-ALBUTEROL 0.5-2.5 (3) MG/3ML IN SOLN
3.0000 mL | Freq: Two times a day (BID) | RESPIRATORY_TRACT | Status: DC
  Administered 2024-09-10 – 2024-09-11 (×4): 3 mL via RESPIRATORY_TRACT
  Filled 2024-09-10 (×4): qty 3

## 2024-09-10 MED ORDER — ADULT MULTIVITAMIN W/MINERALS CH
1.0000 | ORAL_TABLET | Freq: Every day | ORAL | Status: DC
  Administered 2024-09-10 – 2024-09-14 (×5): 1 via ORAL
  Filled 2024-09-10 (×5): qty 1

## 2024-09-10 MED ORDER — MAGNESIUM HYDROXIDE 400 MG/5ML PO SUSP: 30.0000 mL | Freq: Every day | ORAL | Status: DC | PRN

## 2024-09-10 MED ORDER — VALSARTAN-HYDROCHLOROTHIAZIDE 320-25 MG PO TABS: 1.0000 | ORAL_TABLET | Freq: Every day | ORAL | Status: DC

## 2024-09-10 MED ORDER — AMLODIPINE BESYLATE 10 MG PO TABS
10.0000 mg | ORAL_TABLET | Freq: Every day | ORAL | Status: DC
  Administered 2024-09-10 – 2024-09-12 (×3): 10 mg via ORAL
  Filled 2024-09-10 (×2): qty 2
  Filled 2024-09-10: qty 1

## 2024-09-10 MED ORDER — HYDROCHLOROTHIAZIDE 25 MG PO TABS
25.0000 mg | ORAL_TABLET | Freq: Every day | ORAL | Status: DC
  Administered 2024-09-10 – 2024-09-14 (×4): 25 mg via ORAL
  Filled 2024-09-10 (×4): qty 1

## 2024-09-10 MED ORDER — VITAMIN B-12 1000 MCG PO TABS
500.0000 ug | ORAL_TABLET | Freq: Two times a day (BID) | ORAL | Status: DC
  Administered 2024-09-10 – 2024-09-14 (×9): 500 ug via ORAL
  Filled 2024-09-10 (×9): qty 1

## 2024-09-10 MED ORDER — VITAMIN D (ERGOCALCIFEROL) 1.25 MG (50000 UNIT) PO CAPS: 50000.0000 [IU] | ORAL_CAPSULE | ORAL | Status: DC

## 2024-09-10 MED ORDER — HYDRALAZINE HCL 20 MG/ML IJ SOLN
10.0000 mg | Freq: Four times a day (QID) | INTRAMUSCULAR | Status: DC | PRN
  Administered 2024-09-10: 10 mg via INTRAVENOUS
  Filled 2024-09-10: qty 1

## 2024-09-10 MED ORDER — SODIUM CHLORIDE 0.9 % IV SOLN
2.0000 g | INTRAVENOUS | Status: DC
  Administered 2024-09-10: 2 g via INTRAVENOUS
  Filled 2024-09-10: qty 20

## 2024-09-10 MED ORDER — GABAPENTIN 400 MG PO CAPS
400.0000 mg | ORAL_CAPSULE | Freq: Two times a day (BID) | ORAL | Status: DC
  Administered 2024-09-10: 400 mg via ORAL
  Filled 2024-09-10: qty 1

## 2024-09-10 NOTE — H&P (Addendum)
 [place]   PATIENT NAME: [name]    MR#:  [id number]  DATE OF BIRTH:  1946-04-24  DATE OF ADMISSION:  09/09/2024  PRIMARY CARE PHYSICIAN: [facility]   Patient is coming from: Home  REQUESTING/REFERRING PHYSICIAN: Floy Roberts, MD  CHIEF COMPLAINT:   Chief Complaint  Patient presents with   Fall    HISTORY OF PRESENT ILLNESS:  [name] is a 78 y.o. male with medical history significant for coronary artery disease, depression, hypertension tension, dyslipidemia, GERD, and lung cancer and osteoarthritis, who presented to the emergency room with acute onset of altered mental status and suspected fall.  Per EMS the patient apparently rolled out with his bed.  He could not give much history.  The daughter who is with him in the ER has been out of town for the past few days and had lost last seen her father a few days ago when he was in his normal state of health.  He is usually active and today was significantly different.  He has been noted to have congestive cough with mild wheezing.  No reported chest pain or palpitations.  No reported dysuria, oliguria or hematuria or flank pain.  No reported nausea or vomiting or abdominal pain.  No bleeding diathesis.  ED Course: When the patient came to the ER, BP was 161/76 with heart rate of 57 and otherwise normal vital signs.  Respiratory rate later on was 21.  CMP revealed a potassium of 3.5 and albumin 3.2.  VBG showed pH 7.38 and HCO3 of 23.7.  Lactic acid was 1.5.  CBC showed hemoglobin of 9.7 and hematocrit 29.9 with microcytosis.  Urinalysis revealed 100 protein, but otherwise unremarkable.  Urine drug screen came back positive for opiates.  Blood cultures were drawn. EKG as reviewed by me : None Imaging: Noncontrasted head CT scan was limited due to patient's motion and showed no acute intracranial process.  C-spine CT showed no acute cervical spine fracture but was limited due to patient's  motion.  Portable chest x-ray showed the following: 1. New small left pleural effusion. 2. Cavitary mass in the left lung base behind the heart, seen on prior CT, is obscured on today's radiograph. 3. Diffuse interstitial pattern with bronchiectasis, interstitial reticulonodular changes, and coarse infiltrates, unchanged since prior study, consistent with emphysema and chronic interstitial lung disease.  The patient was given 1 L bolus of IV lactated Ringer .  The patient was not back to his baseline in the ER.  He will be admitted to an observation telemetry bed for further evaluation and management. PAST MEDICAL HISTORY:   Past Medical History:  Diagnosis Date   Anemia    Arthritis    Back pain    scoliosis   CAD (coronary artery disease) 03/24/2011   - S/p stent to RCA in 1999  - Cath in 2019 with patent stent and mod nonobstructive disease elsewhere  - Myoview  06/05/23: EF 61, normal perfusion, low risk     COPD (chronic obstructive pulmonary disease) (HCC)    Depression    Diverticulosis    GERD (gastroesophageal reflux disease)    GI bleed    History of colon polyps    History of hiatal hernia    Hyperkalemia    Hyperlipidemia    Hypertension    Lung cancer (HCC) 06/04/2023   Radiation Rx; [facility]    Scoliosis    Tobacco abuse     PAST  SURGICAL HISTORY:   Past Surgical History:  Procedure Laterality Date   APPENDECTOMY  31   BACK SURGERY     CARDIAC CATHETERIZATION  1999   stent placement RCA   CARDIAC CATHETERIZATION  2010   mild left CA distal stenosis, mild irregularities in RCA and LAD. patent setn in RCA   COLONOSCOPY WITH PROPOFOL  N/A 06/16/2018   Procedure: COLONOSCOPY WITH PROPOFOL ;  Surgeon: Toledo, Ladell POUR, MD;  Location: [facility] ENDOSCOPY;  Service: Gastroenterology;  Laterality: N/A;   CORONARY ANGIOPLASTY     CORONARY ARTERY BYPASS GRAFT     denies 09/30/19   ESOPHAGOGASTRODUODENOSCOPY (EGD) WITH PROPOFOL  N/A 06/16/2018   Procedure:  ESOPHAGOGASTRODUODENOSCOPY (EGD) WITH PROPOFOL ;  Surgeon: Toledo, Ladell POUR, MD;  Location: [facility] ENDOSCOPY;  Service: Gastroenterology;  Laterality: N/A;   FOOT SURGERY  1973   left   LEFT HEART CATH AND CORONARY ANGIOGRAPHY N/A 01/22/2018   Procedure: LEFT HEART CATH AND CORONARY ANGIOGRAPHY;  Surgeon: Verlin Lonni BIRCH, MD;  Location: [facility];  Service: Cardiovascular;  Laterality: N/A;   LEFT HEART CATHETERIZATION WITH CORONARY ANGIOGRAM N/A 01/11/2015   Procedure: LEFT HEART CATHETERIZATION WITH CORONARY ANGIOGRAM;  Surgeon: Lonni BIRCH Verlin, MD;  Location: [facility] C[facility];  Service: Cardiovascular;  Laterality: N/A;   OLECRANON BURSECTOMY Right 10/04/2019   Procedure: OLECRANON BURSA;  Surgeon: Tobie Priest, MD;  Location: [facility] ORS;  Service: Orthopedics;  Laterality: Right;   TRICEPS TENDON REPAIR Right 10/04/2019   Procedure: TRICEPS TENDON REPAIR AND PLECRANON BURSA EXCISION, POSSIBLE ALLOGRAFT AUGMENTATION.;  Surgeon: Tobie Priest, MD;  Location: [facility] ORS;  Service: Orthopedics;  Laterality: Right;    SOCIAL HISTORY:   Social History   Tobacco Use   Smoking status: Every Day    Current packs/day: 0.50    Average packs/day: 0.5 packs/day for 35.0 years (17.5 ttl pk-yrs)    Types: Cigarettes   Smokeless tobacco: Never   Tobacco comments:    10 cigarettes daily.  Substance Use Topics   Alcohol use: Yes    Alcohol/week: 6.0 standard drinks of alcohol    Types: 6 Cans of beer per week    FAMILY HISTORY:   Family History  Problem Relation Age of Onset   Alzheimer's disease Father    Angina Father    Diabetes Cousin        mothers side   Heart attack Cousin        fathers side   Hyperlipidemia Cousin        fathers side   Hypertension Cousin        fathers side    DRUG ALLERGIES:   Allergies  Allergen Reactions   Levofloxacin Itching and Rash   Doxycycline Nausea And Vomiting    REVIEW OF SYSTEMS:   ROS As per history of present illness.  All pertinent systems were reviewed above. Constitutional, HEENT, cardiovascular, respiratory, GI, GU, musculoskeletal, neuro, psychiatric, endocrine, integumentary and hematologic systems were reviewed and are otherwise negative/unremarkable except for positive findings mentioned above in the HPI.   MEDICATIONS AT HOME:   Prior to Admission medications   Medication Sig Start Date End Date Taking? Authorizing Provider  acetaminophen  (TYLENOL ) 500 MG tablet Take 1 tablet (500 mg total) by mouth every 8 (eight) hours as needed for mild pain (pain score 1-3) or moderate pain (pain score 4-6). 12/10/23   Singh, Prashant K, MD  amLODipine  (NORVASC ) 10 MG tablet Take 1 tablet (10 mg total) by mouth daily. 12/10/23   Singh, Prashant K, MD  aspirin  EC 81 MG tablet Take 1 tablet (81 mg total) by mouth daily. Swallow whole. 12/11/23   Singh, Prashant K, MD  cyanocobalamin  1000 MCG tablet Take 0.5 tablets (500 mcg total) by mouth 2 (two) times daily. VITAMIN B-12 12/10/23   Singh, Prashant K, MD  donepezil  (ARICEPT ) 10 MG tablet Take 5 mg by mouth daily. 08/31/21   [provider]  escitalopram  (LEXAPRO ) 20 MG tablet Take 1 tablet (20 mg total) by mouth daily. Patient taking differently: Take 20 mg by mouth in the morning. 04/12/14   Verlin Lonni BIRCH, MD  esomeprazole  (NEXIUM ) 20 MG capsule Take 20 mg by mouth daily at 12 noon.    [provider]  folic acid  (FOLVITE ) 1 MG tablet Take 1 mg by mouth daily. 10/01/21   [provider]  gabapentin  (NEURONTIN ) 400 MG capsule Take 400 mg by mouth 2 (two) times daily.    [provider]  ibuprofen  (ADVIL ) 800 MG tablet Take 800 mg by mouth every 6 (six) hours as needed for mild pain (pain score 1-3), moderate pain (pain score 4-6) or headache. Takes with Oxy if needed    [provider]  ipratropium-albuterol  (DUONEB) 0.5-2.5 (3) MG/3ML SOLN Take 3 mLs by nebulization 2 (two) times daily. 07/30/23   Patel, Sona, MD   montelukast  (SINGULAIR ) 10 MG tablet TAKE 1 TABLET AT BEDTIME 04/28/20   Scarboro, Adam J, NP  morphine  (MS CONTIN ) 15 MG 12 hr tablet Take 15 mg by mouth 2 (two) times daily.    [provider]  nitrofurantoin (MACRODANTIN) 100 MG capsule Take 100 mg by mouth 4 (four) times daily.    [provider]  oxyCODONE -acetaminophen  (PERCOCET) 5-325 MG per tablet Take 2 tablets by mouth in the morning, at noon, and at bedtime. For pain    [provider]  promethazine  (PHENERGAN ) 25 MG tablet Take 25 mg by mouth every 12 (twelve) hours as needed for nausea.    [provider]  rosuvastatin  (CRESTOR ) 40 MG tablet Take 1 tablet (40 mg total) by mouth every evening. 08/16/24   Verlin Lonni BIRCH, MD  UNABLE TO FIND Take 1 tablet by mouth daily. Med Name: Valsartan /hydrochlorothiazide 320-25MG  TAB    [provider]  Vitamin D , Ergocalciferol , (DRISDOL ) 50000 UNITS CAPS capsule Take 50,000 Units by mouth every Wednesday.  12/15/14   [provider]      VITAL SIGNS:  Blood pressure (!) 190/76, pulse (!) 57, temperature (!) 97.5 F (36.4 C), temperature source Oral, resp. rate 16, height 5' 6 (1.676 m), weight 50.3 kg, SpO2 93%.  PHYSICAL EXAMINATION:  Physical Exam  GENERAL:  78 y.o.-year-old male patient lying in the bed with no acute distress.  EYES: Pupils equal, round, reactive to light and accommodation. No scleral icterus. Extraocular muscles intact.  HEENT: Head atraumatic, normocephalic. Oropharynx and nasopharynx clear.  NECK:  Supple, no jugular venous distention. No thyroid enlargement, no tenderness.  LUNGS: Normal breath sounds bilaterally, no wheezing, rales,rhonchi or crepitation. No use of accessory muscles of respiration.  CARDIOVASCULAR: Regular rate and rhythm, S1, S2 normal. No murmurs, rubs, or gallops.  ABDOMEN: Soft, nondistended, nontender. Bowel sounds present. No organomegaly or mass.  EXTREMITIES: No pedal edema,  cyanosis, or clubbing.  NEUROLOGIC: Cranial nerves II through XII are intact. Muscle strength 5/5 in all extremities. Sensation intact. Gait not checked.  PSYCHIATRIC: The patient is alert and oriented x 3.  Normal affect and good eye contact. SKIN: No obvious rash, lesion, or  ulcer.   LABORATORY PANEL:   CBC Recent Labs  Lab 09/09/24 2004  WBC 5.7  HGB 9.7*  HCT 29.9*  PLT 175   ------------------------------------------------------------------------------------------------------------------  Chemistries  Recent Labs  Lab 09/09/24 2004  NA 135  K 3.5  CL 100  CO2 25  GLUCOSE 88  BUN 15  CREATININE 1.04  CALCIUM  9.2  AST 26  ALT 9  ALKPHOS 102  BILITOT 0.5   ------------------------------------------------------------------------------------------------------------------  Cardiac Enzymes No results for input(s): TROPONINI in the last 168 hours. ------------------------------------------------------------------------------------------------------------------  RADIOLOGY:  DG Chest Port 1 View Result Date: 09/09/2024 EXAM: 1 VIEW(S) XRAY OF THE CHEST 09/09/2024 08:44:00 PM COMPARISON: CT chest 04/08/2024 and chest radiograph 12/07/2023. CLINICAL HISTORY: Questionable sepsis - evaluate for abnormality. FINDINGS: LUNGS AND PLEURA: Diffuse emphysematous changes throughout the lungs. Diffuse interstitial pattern to the lungs with bronchiectasis and interstitial reticulonodular changes as well as coarse infiltrates. This is unchanged since the prior study and consistent with chronic interstitial lung disease. There is a new small left pleural effusion. The cavitary mass seen on prior CT the left lung base behind the heart is obscured on today's radiograph. No pneumothorax. HEART AND MEDIASTINUM: Heart size is normal for the technique. Calcification of the aorta. BONES AND SOFT TISSUES: Postoperative changes in the lumbar spine. No acute osseous abnormality. IMPRESSION: 1. New  small left pleural effusion. 2. Cavitary mass in the left lung base behind the heart, seen on prior CT, is obscured on today's radiograph. 3. Diffuse interstitial pattern with bronchiectasis, interstitial reticulonodular changes, and coarse infiltrates, unchanged since prior study, consistent with emphysema and chronic interstitial lung disease. Electronically signed by: Elsie Gravely MD 09/09/2024 08:50 PM EST RP Workstation: [id number]   CT Cervical Spine Wo Contrast Result Date: 09/09/2024 CLINICAL DATA:  Clemens out of bed EXAM: CT CERVICAL SPINE WITHOUT CONTRAST TECHNIQUE: Multidetector CT imaging of the cervical spine was performed without intravenous contrast. Multiplanar CT image reconstructions were also generated. RADIATION DOSE REDUCTION: This exam was performed according to the departmental dose-optimization program which includes automated exposure control, adjustment of the mA and/or kV according to patient size and/or use of iterative reconstruction technique. COMPARISON:  12/07/2023 FINDINGS: Study is limited by patient motion throughout the exam. Alignment: Mild degenerative anterolisthesis of C6 on C7. Otherwise alignment is anatomic. Skull base and vertebrae: No acute fracture. No primary bone lesion or focal pathologic process. Soft tissues and spinal canal: No prevertebral fluid or swelling. No visible canal hematoma. Disc levels: Mild diffuse facet hypertrophy. Mild multilevel spondylosis greatest at C4-5 and C5-6. Upper chest: Airway is patent.  Bilateral upper lobe emphysema. Other: Reconstructed images demonstrate no additional findings. IMPRESSION: 1. Limited study due to patient motion. 2. No acute cervical spine fracture. Electronically Signed   By: Ozell Daring M.D.   On: 09/09/2024 20:46   CT Head Wo Contrast Result Date: 09/09/2024 CLINICAL DATA:  Clemens out of bed, history of lung cancer EXAM: CT HEAD WITHOUT CONTRAST TECHNIQUE: Contiguous axial images were obtained from the  base of the skull through the vertex without intravenous contrast. RADIATION DOSE REDUCTION: This exam was performed according to the departmental dose-optimization program which includes automated exposure control, adjustment of the mA and/or kV according to patient size and/or use of iterative reconstruction technique. COMPARISON:  12/07/2023 FINDINGS: Brain: Assessment of the brain parenchyma is limited by patient motion throughout the study. No evidence of acute infarct or hemorrhage. The lateral ventricles and midline structures are unremarkable. No acute extra-axial fluid collections. No mass effect.  Vascular: No hyperdense vessel or unexpected calcification. Skull: Normal. Negative for fracture or focal lesion. Sinuses/Orbits: No acute finding. Other: None. IMPRESSION: 1. Limited study due to patient motion. 2. No acute intracranial process. Electronically Signed   By: Ozell Daring M.D.   On: 09/09/2024 20:39      IMPRESSION AND PLAN:  Assessment and Plan: * Acute encephalopathy - The patient will be admitted to a medical telemetry observation bed. - Will follow neurochecks every 4 hours for 24 hours. - This could be related to his hypertensive urgency and possibly early lower respiratory infection. It could be medication related as well. - Will continue to monitor his mental status. - Will hold off narcotics and sedatives. - Management otherwise as below.  Hypertensive urgency - Will continue antihypertensive therapy. - The patient will be placed on as needed IV hydralazine  and labetalol.  Lower respiratory infection - Will place the patient on IV Rocephin  and Zithromax  as well as mucolytic therapy and bronchodilator therapy. - This could be contributing to his altered mental status.  Dyslipidemia - Will continue statin therapy.  GERD without esophagitis - Will continue PPI therapy.  Chronic pain - We are holding off opiates given his altered mental status. - Will place him  on as needed IV Toradol .  Chronic obstructive pulmonary disease (COPD) (HCC) - We will continue DuoNebs.  Arteriosclerotic dementia with depression (HCC) --Will continue Aricept  and Lexapro .   DVT prophylaxis: Lovenox .  Advanced Care Planning:  Code Status: full code.  Family Communication:  The plan of care was discussed in details with the patient (and family). I answered all questions. The patient agreed to proceed with the above mentioned plan. Further management will depend upon hospital course. Disposition Plan: Back to previous home environment Consults called: none.  All the records are reviewed and case discussed with ED provider.  Status is: Observation  I certify that at the time of admission, it is my clinical judgment that the patient will require hospital care extending less than 2 midnights.                            Dispo: The patient is from: Home              Anticipated d/c is to: Home              Patient currently is not medically stable to d/c.              Difficult to place patient: No  [place] DELENA Peaches M.D on 09/10/2024 at 1:45 AM  [facility]   From 7 PM-7 AM, contact night-coverage [email or web address]  CC: Primary care physician; [facility]

## 2024-09-10 NOTE — Assessment & Plan Note (Signed)
-   We are holding off opiates given his altered mental status. - Will place him on as needed IV Toradol .

## 2024-09-10 NOTE — Assessment & Plan Note (Signed)
--  Will continue Aricept  and Lexapro .

## 2024-09-10 NOTE — Assessment & Plan Note (Signed)
-   We will continue DuoNebs
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
## 2024-09-10 NOTE — Assessment & Plan Note (Signed)
-   The patient will be admitted to an observation medical telemetry bed. - Will follow neurochecks every 4 hours for 24 hours. - This could be partly related to hypertensive encephalopathy. - Management as below.

## 2024-09-10 NOTE — Progress Notes (Signed)
 PT Cancellation Note  Patient Details Name: [name] MRN: [id number] DOB: [birth year]   Cancelled Treatment:    Reason Eval/Treat Not Completed: Fatigue/lethargy limiting ability to participate (Consult received and chart reviewed.  Patient sleeping soundly upon arrival to room.  Awakens briefly to max cuing/stimulation, but unable to maintain alertness, eyes open >1-2 seconds before returning to sleep.  Of note, noted with sats 86% on RA; per RN, applied 2L O2 via [place] for recovery >90%.    Will hold evaluation at this time and re-attempt as alertness improves and patient able to actively participate.)   Neno Hohensee H. Delores, PT, DPT, NCS 09/10/24, 12:30 PM [phone]

## 2024-09-10 NOTE — Progress Notes (Signed)
 Initial Nutrition Assessment  DOCUMENTATION CODES:   Underweight, Severe malnutrition in context of chronic illness  INTERVENTION:   -Downgrade diet to dysphagia 3 diet for ease of intake -Ensure Plus High Protein po BID, each supplement provides 350 kcal and 20 grams of protein  -Magic cup TID with meals, each supplement provides 290 kcal and 9 grams of protein  -MVI with minerals daily  NUTRITION DIAGNOSIS:   Severe Malnutrition related to chronic illness (lung cancer) as evidenced by moderate fat depletion, severe fat depletion, moderate muscle depletion, severe muscle depletion.  GOAL:   Patient will meet greater than or equal to 90% of their needs  MONITOR:   PO intake, Supplement acceptance  REASON FOR ASSESSMENT:   Consult Assessment of nutrition requirement/status, Diet education  ASSESSMENT:   78 y.o. male with medical history significant for coronary artery disease, depression, hypertension tension, dyslipidemia, GERD, and lung cancer and osteoarthritis, who presented with acute onset of altered mental status and suspected fall  Patient admitted with acute encephalopathy.   Reviewed I/O's: +1.8 L x 24 hours  Per MD notes, patient is typically very active at baseline. Daughter reports she was out of town for a few days and patient was significantly different when she returned.   Patient lying in bed at time of visit. He did arouse his eyes to touch and kept his eyes closed during conversation. No family at bedside. He reports he is tired from being moved around all day and has not been eating much due to this. He reports at home he eats very well, but unable to provide further details. Patient has no teeth, but denies any difficulty chewing or swallowing issues.   Patient endorses weight loss, but unsure how much or when the weight loss started. He reports his UBW is between 40 and 70. Reviewed weight history; patient has experienced a 8.9% weight loss in the  past 7 months. While this is not significant for time frame, it is concerning in the setting of malnutrition and weakness. RD will downgrade diet due to energy conservation.   Discussed importance of good meal and supplement intake to promote healing. Patient is amenable to Ensure, stating he has drank it in the past and liked it.   Medications reviewed and include vitamin B-12, and protonix .   Labs reviewed: K: 3.2 (received PO supplementation).   NUTRITION - FOCUSED PHYSICAL EXAM:  Flowsheet Row Most Recent Value  Orbital Region Moderate depletion  Upper Arm Region Severe depletion  Thoracic and Lumbar Region Severe depletion  Buccal Region Moderate depletion  Temple Region Moderate depletion  Clavicle Bone Region Severe depletion  Clavicle and Acromion Bone Region Severe depletion  Scapular Bone Region Severe depletion  Dorsal Hand Moderate depletion  Patellar Region Severe depletion  Anterior Thigh Region Severe depletion  Posterior Calf Region Severe depletion  Edema (RD Assessment) None  Hair Reviewed  Eyes Reviewed  Mouth Reviewed  Skin Reviewed  Nails Reviewed    Diet Order:   Diet Order             DIET DYS 3 Fluid consistency: Thin  Diet effective now                   EDUCATION NEEDS:   Education needs have been addressed  Skin:  Skin Assessment: Skin Integrity Issues: Skin Integrity Issues:: Other (Comment) Other: traumatic wounds to right knee and right upper arm  Last BM:  09/10/24 (type 7)  Height:   Ht Readings  from Last 1 Encounters:  09/09/24 5' 6 (1.676 m)    Weight:   Wt Readings from Last 1 Encounters:  09/09/24 50.3 kg    Ideal Body Weight:  64.5 kg  BMI:  Body mass index is 17.9 kg/m.  Estimated Nutritional Needs:   Kcal:  1500-1700  Protein:  75-90 grams  Fluid:  1.5-1.7 L    Margery ORN, RD, LDN, CDCES Registered Dietitian III Certified Diabetes Care and Education Specialist If unable to reach this RD, please  use RD Inpatient group chat on secure chat between hours of 8am-4 pm daily

## 2024-09-10 NOTE — Assessment & Plan Note (Signed)
-   Will continue antihypertensive therapy. - The patient will be placed on as needed IV hydralazine  and labetalol .

## 2024-09-10 NOTE — Assessment & Plan Note (Addendum)
-   The patient will be admitted to a medical telemetry observation bed. - Will follow neurochecks every 4 hours for 24 hours. - This could be related to his hypertensive urgency and possibly early lower respiratory infection. It could be medication related as well. - Will continue to monitor his mental status. - Will hold off narcotics and sedatives. - Management otherwise as below.

## 2024-09-10 NOTE — Plan of Care (Signed)
  Problem: Health Behavior/Discharge Planning: Goal: Ability to manage health-related needs will improve Outcome: Progressing   Problem: Clinical Measurements: Goal: Will remain free from infection Outcome: Progressing   Problem: Coping: Goal: Level of anxiety will decrease Outcome: Progressing   Problem: Elimination: Goal: Will not experience complications related to bowel motility Outcome: Progressing   Problem: Pain Managment: Goal: General experience of comfort will improve and/or be controlled Outcome: Progressing   Problem: Safety: Goal: Ability to remain free from injury will improve Outcome: Progressing   Problem: Skin Integrity: Goal: Risk for impaired skin integrity will decrease Outcome: Progressing

## 2024-09-10 NOTE — Care Management Obs Status (Signed)
 MEDICARE OBSERVATION STATUS NOTIFICATION   Patient Details  Name: [name] MRN: [id number] Date of Birth: [birth year]   Medicare Observation Status Notification Given:  Yes Spoke with daughter, Filiberto Wamble [phone], patient was sleeping couldn't wake him   Rojelio SHAUNNA Rattler 09/10/2024, 5:03 PM

## 2024-09-10 NOTE — Assessment & Plan Note (Addendum)
-   Will place the patient on IV Rocephin  and Zithromax  as well as mucolytic therapy and bronchodilator therapy. - This could be contributing to his altered mental status.

## 2024-09-10 NOTE — Assessment & Plan Note (Signed)
-   Will continue PPI therapy.

## 2024-09-10 NOTE — Evaluation (Signed)
 Occupational Therapy Evaluation Patient Details Name: [name] MRN: [id number] DOB: 12-28-1945 Today's Date: 09/10/2024   History of Present Illness   78 y.o. male with medical history significant for coronary artery disease, depression, hypertension tension, dyslipidemia, GERD, and lung cancer and osteoarthritis, who presented to the emergency room with acute onset of altered mental status and suspected fall.  Per EMS the patient apparently rolled out with his bed.  He could not give much history.  The daughter who is with him in the ER has been out of town for the past few days and had lost last seen her father a few days ago when he was in his normal state of health.  He is usually active and today was significantly different.     Clinical Impressions Patient presenting with decreased Ind in self care,balance, functional mobility, transfers, endurance, and safety awareness. Pt has several neighbors present and visiting when OT enters the room. They assisted him with ordering his dinner meal.  Patient is very fatigued and having some difficulty remaining awake this session. Pt reports living at home alone and being Ind without use of AD. Pt endorses recent fall at home and short distance ambulation only. Pt performing bed mobility with mod- max A. Pt very fatigued once getting to EOB and unable to attempt standing safely with therapist this session. Patient will benefit from acute OT to increase overall independence in the areas of ADLs, functional mobility, and safety awareness in order to safely discharge.     If plan is discharge home, recommend the following:   A lot of help with walking and/or transfers;Two people to help with walking and/or transfers;Assistance with cooking/housework;Assistance with feeding;Direct supervision/assist for medications management;Direct supervision/assist for financial management;Assist for transportation;Help with stairs or ramp for entrance;Supervision  due to cognitive status     Functional Status Assessment   Patient has had a recent decline in their functional status and demonstrates the ability to make significant improvements in function in a reasonable and predictable amount of time.     Equipment Recommendations   BSC/3in1;Other (comment) (2WW)      Precautions/Restrictions   Precautions Precautions: Fall     Mobility Bed Mobility Overal bed mobility: Needs Assistance Bed Mobility: Sit to Supine, Supine to Sit     Supine to sit: Mod assist Sit to supine: Mod assist        Transfers                   General transfer comment: unable to attempt secondary to fatigue      Balance Overall balance assessment: Needs assistance Sitting-balance support: Feet supported Sitting balance-Leahy Scale: Fair                                     ADL either performed or assessed with clinical judgement   ADL Overall ADL's : Needs assistance/impaired                         Toilet Transfer: Maximal assistance Toilet Transfer Details (indicate cue type and reason): simulated                 Vision Patient Visual Report: No change from baseline              Pertinent Vitals/Pain Pain Assessment Pain Assessment: No/denies pain     Extremity/Trunk Assessment Upper Extremity  Assessment Upper Extremity Assessment: Generalized weakness;Right hand dominant;LUE deficits/detail LUE Deficits / Details: decreased shoulder elevation   Lower Extremity Assessment Lower Extremity Assessment: Generalized weakness       Communication Communication Communication: No apparent difficulties   Cognition Arousal: Lethargic, Alert Behavior During Therapy: WFL for tasks assessed/performed Cognition: No apparent impairments                               Following commands: Impaired Following commands impaired: Follows one step commands with increased time      Cueing  General Comments   Cueing Techniques: Verbal cues              Home Living Family/patient expects to be discharged to:: Private residence Living Arrangements: Alone Available Help at Discharge: Family;Available PRN/intermittently;Neighbor Type of Home: House Home Access: Stairs to enter [employer] of Steps: 2 Entrance Stairs-Rails: None Home Layout: One level     Bathroom Shower/Tub: Chief Strategy Officer: Standard Bathroom Accessibility: Yes How Accessible: Accessible via walker Home Equipment: Rolling Walker (2 wheels);Cane - single point;Electric scooter          Prior Functioning/Environment Prior Level of Function : Independent/Modified Independent             Mobility Comments: Pt reports he does not walk very much and just walks enough to get to truck or electric scooter ADLs Comments: independent    OT Problem List: Decreased strength;Impaired balance (sitting and/or standing);Decreased cognition;Pain;Decreased range of motion;Impaired vision/perception;Decreased safety awareness;Cardiopulmonary status limiting activity;Decreased activity tolerance   OT Treatment/Interventions: Self-care/ADL training;Therapeutic exercise;Patient/family education;Balance training;Energy conservation;Therapeutic activities;DME and/or AE instruction      OT Goals(Current goals can be found in the care plan section)   Acute Rehab OT Goals Patient Stated Goal: to get stronger and rest OT Goal Formulation: With patient Time For Goal Achievement: 09/24/24 Potential to Achieve Goals: Fair ADL Goals Pt Will Perform Grooming: with supervision;standing Pt Will Perform Lower Body Dressing: with contact guard assist;sit to/from stand Pt Will Transfer to Toilet: with contact guard assist;ambulating Pt Will Perform Toileting - Clothing Manipulation and hygiene: with contact guard assist;sit to/from stand   OT Frequency:  Min 2X/week        AM-PAC OT 6 Clicks Daily Activity     Outcome Measure Help from another person eating meals?: None Help from another person taking care of personal grooming?: A Little Help from another person toileting, which includes using toliet, bedpan, or urinal?: A Lot Help from another person bathing (including washing, rinsing, drying)?: A Lot Help from another person to put on and taking off regular upper body clothing?: A Little Help from another person to put on and taking off regular lower body clothing?: A Lot 6 Click Score: 16   End of Session Equipment Utilized During Treatment: Oxygen (2Ls) Nurse Communication: Mobility status  Activity Tolerance: Patient limited by fatigue Patient left: in bed;with call bell/phone within reach;with bed alarm set  OT Visit Diagnosis: Unsteadiness on feet (R26.81);Repeated falls (R29.6);Muscle weakness (generalized) (M62.81);History of falling (Z91.81)                Time: 1510-1530 OT Time Calculation (min): 20 min Charges:  OT General Charges $OT Visit: 1 Visit OT Evaluation $OT Eval Moderate Complexity: 1 Mod OT Treatments $Therapeutic Activity: 8-22 mins  Izetta Claude, MS, OTR/L , CBIS ascom [phone]  09/10/24, 4:19 PM

## 2024-09-10 NOTE — Assessment & Plan Note (Signed)
 Will continue statin therapy

## 2024-09-10 NOTE — Plan of Care (Signed)

## 2024-09-11 DIAGNOSIS — G9340 Encephalopathy, unspecified: Secondary | ICD-10-CM | POA: Diagnosis not present

## 2024-09-11 LAB — BASIC METABOLIC PANEL WITH GFR
Anion gap: 10 (ref 5–15)
BUN: 15 mg/dL (ref 8–23)
CO2: 25 mmol/L (ref 22–32)
Calcium: 9 mg/dL (ref 8.9–10.3)
Chloride: 99 mmol/L (ref 98–111)
Creatinine, Ser: 0.94 mg/dL (ref 0.61–1.24)
GFR, Estimated: >60 mL/min (ref >60)
Glucose, Bld: 99 mg/dL (ref 70–99)
Potassium: 2.3 mmol/L — CL (ref 3.5–5.1)
Sodium: 135 mmol/L (ref 135–145)

## 2024-09-11 LAB — MAGNESIUM: Magnesium: 2.3 mg/dL (ref 1.7–2.4)

## 2024-09-11 MED ORDER — MORPHINE SULFATE (PF) 2 MG/ML IV SOLN
2.0000 mg | INTRAVENOUS | Status: DC | PRN
  Administered 2024-09-11 – 2024-09-13 (×10): 2 mg via INTRAVENOUS
  Filled 2024-09-11 (×10): qty 1

## 2024-09-11 MED ORDER — SODIUM CHLORIDE 0.9 % IV SOLN: INTRAVENOUS | Status: DC

## 2024-09-11 MED ORDER — DOXYCYCLINE HYCLATE 100 MG PO TABS
100.0000 mg | ORAL_TABLET | Freq: Two times a day (BID) | ORAL | Status: DC
  Administered 2024-09-11 – 2024-09-14 (×7): 100 mg via ORAL
  Filled 2024-09-11 (×7): qty 1

## 2024-09-11 MED ORDER — IPRATROPIUM-ALBUTEROL 0.5-2.5 (3) MG/3ML IN SOLN: 3.0000 mL | Freq: Four times a day (QID) | RESPIRATORY_TRACT | Status: DC | PRN

## 2024-09-11 MED ORDER — POTASSIUM CHLORIDE CRYS ER 20 MEQ PO TBCR
40.0000 meq | EXTENDED_RELEASE_TABLET | ORAL | Status: AC
Start: 2024-09-11 — End: 2024-09-11
  Administered 2024-09-11 (×3): 40 meq via ORAL
  Filled 2024-09-11 (×3): qty 2

## 2024-09-11 NOTE — Evaluation (Signed)
 Physical Therapy Evaluation Patient Details Name: [name] MRN: [id number] DOB: 1946/07/24 Today's Date: 09/11/2024  History of Present Illness  78 y.o. male with medical history significant for coronary artery disease, depression, hypertension tension, dyslipidemia, GERD, and lung cancer and osteoarthritis, who presented to the emergency room with acute onset of altered mental status and suspected fall.  Per EMS the patient apparently rolled out with his bed.  He could not give much history.  The daughter who is with him in the ER has been out of town for the past few days and had lost last seen her father a few days ago when he was in his normal state of health.  He is usually active and today was significantly different.   Clinical Impression  Patient received in bed, he is alert. States he wants to get into bathroom. BSC brought out to patient. He is mod I with bed mobility, required min A for sit to stand and min A to pivot to [facility]. Patient is limited by fatigue. He was unable to ambulate this session due to needing to lie back down after using BSC. Patient will continue to benefit from skilled PT to improve strength, endurance and safety with mobility.            If plan is discharge home, recommend the following: A lot of help with walking and/or transfers;A lot of help with bathing/dressing/bathroom;Help with stairs or ramp for entrance   Can travel by private vehicle   Yes    Equipment Recommendations Other (comment) (TBD)  Recommendations for Other Services       Functional Status Assessment Patient has had a recent decline in their functional status and demonstrates the ability to make significant improvements in function in a reasonable and predictable amount of time.     Precautions / Restrictions Precautions Precautions: Fall Recall of Precautions/Restrictions: Impaired Restrictions Weight Bearing Restrictions Per Provider Order: Yes LLE Weight Bearing Per Provider  Order: Weight bearing as tolerated      Mobility  Bed Mobility Overal bed mobility: Modified Independent Bed Mobility: Supine to Sit, Sit to Supine     Supine to sit: Modified independent (Device/Increase time) Sit to supine: Modified independent (Device/Increase time)   General bed mobility comments: patient needed to use BSC, performed bed mobility without assistance    Transfers Overall transfer level: Needs assistance Equipment used: 1 person hand held assist Transfers: Sit to/from Stand, Bed to chair/wheelchair/BSC Sit to Stand: Min assist   Step pivot transfers: Min assist       General transfer comment: patient leaning forward on bed while on BSC due to fatigue.    Ambulation/Gait               General Gait Details: unable due to fatigue  Stairs            Wheelchair Mobility     Tilt Bed    Modified Rankin (Stroke Patients Only)       Balance Overall balance assessment: Needs assistance Sitting-balance support: Feet supported Sitting balance-Leahy Scale: Fair     Standing balance support: No upper extremity supported, During functional activity Standing balance-Leahy Scale: Fair Standing balance comment: able to stand without UE support and turn to sit back down on bed with cga.                             Pertinent Vitals/Pain Pain Assessment Pain Assessment: No/denies pain  Home Living Family/patient expects to be discharged to:: Private residence Living Arrangements: Alone Available Help at Discharge: Family;Available PRN/intermittently Type of Home: House Home Access: Stairs to enter Entrance Stairs-Rails: None Entrance Stairs-Number of Steps: 2   Home Layout: One level Home Equipment: [employer]ant (2 wheels);Cane - single point;Electric scooter Additional Comments: Pt lives alone, has daughter nearby who can assist occasionally.    Prior Function Prior Level of Function : Independent/Modified  Independent             Mobility Comments: Pt reports he does not walk very much and just walks enough to get to truck or electric scooter, endorses falls ADLs Comments: independent     Extremity/Trunk Assessment   Upper Extremity Assessment Upper Extremity Assessment: Defer to OT evaluation    Lower Extremity Assessment Lower Extremity Assessment: Generalized weakness    Cervical / Trunk Assessment Cervical / Trunk Assessment: Kyphotic  Communication   Communication Communication: No apparent difficulties    Cognition Arousal: Alert, Lethargic Behavior During Therapy: WFL for tasks assessed/performed   PT - Cognitive impairments: Problem solving, Safety/Judgement                         Following commands: Impaired Following commands impaired: Follows one step commands with increased time     Cueing Cueing Techniques: Verbal cues     General Comments      Exercises     Assessment/Plan    PT Assessment Patient needs continued PT services  PT Problem List Decreased strength;Decreased activity tolerance;Decreased balance;Decreased mobility;Decreased knowledge of use of DME;Decreased safety awareness       PT Treatment Interventions DME instruction;Gait training;Stair training;Functional mobility training;Therapeutic activities;Therapeutic exercise;Balance training;Neuromuscular re-education;Cognitive remediation;Patient/family education    PT Goals (Current goals can be found in the Care Plan section)  Acute Rehab PT Goals Patient Stated Goal: to feel better PT Goal Formulation: With patient Time For Goal Achievement: 09/24/24 Potential to Achieve Goals: Fair    Frequency Min 2X/week     Co-evaluation               AM-PAC PT 6 Clicks Mobility  Outcome Measure Help needed turning from your back to your side while in a flat bed without using bedrails?: None Help needed moving from lying on your back to sitting on the side of a flat  bed without using bedrails?: None Help needed moving to and from a bed to a chair (including a wheelchair)?: A Little Help needed standing up from a chair using your arms (e.g., wheelchair or bedside chair)?: A Little Help needed to walk in hospital room?: Total Help needed climbing 3-5 steps with a railing? : Total 6 Click Score: 16    End of Session Equipment Utilized During Treatment: Oxygen Activity Tolerance: Patient limited by fatigue;Patient limited by lethargy Patient left: in bed;with call bell/phone within reach;with bed alarm set Nurse Communication: Mobility status PT Visit Diagnosis: Other abnormalities of gait and mobility (R26.89);Difficulty in walking, not elsewhere classified (R26.2);Muscle weakness (generalized) (M62.81);Unsteadiness on feet (R26.81);History of falling (Z91.81);Adult, failure to thrive  (R62.7)    Time: 0950-1005 PT Time Calculation (min) (ACUTE ONLY): 15 min   Charges:   PT Evaluation $PT Eval Low Complexity: 1 Low   PT General Charges $$ ACUTE PT VISIT: 1 Visit         Kara Mierzejewski, PT, GCS 09/11/24,10:17 AM

## 2024-09-11 NOTE — Progress Notes (Signed)
 Progress Note   Patient: [name] FMW:[id number] DOB: [birth year] DOA: 09/09/2024     0 DOS: the patient was seen and examined on 09/11/2024   Brief hospital course:  [name] is a 78 y.o. male with medical history significant for coronary artery disease, depression, hypertension tension, dyslipidemia, GERD, and lung cancer and osteoarthritis, who presented to the emergency room with acute onset of altered mental status and suspected fall.  Per EMS the patient apparently rolled out with his bed.  He could not give much history.  The daughter who is with him in the ER has been out of town for the past few days and had lost last seen her father a few days ago when he was in his normal state of health.  He is usually active and today was significantly different.    Noncontrasted head CT scan was limited due to patient's motion and showed no acute intracranial process.  C-spine CT showed no acute cervical spine fracture but was limited due to patient's motion.     chest x-ray showed the following: 1. New small left pleural effusion. 2. Cavitary mass in the left lung base behind the heart, seen on prior CT, is obscured on today's radiograph. 3. Diffuse interstitial pattern with bronchiectasis, interstitial reticulonodular changes, and coarse infiltrates, unchanged since prior study, consistent with emphysema and chronic interstitial lung disease.   Assessment and Plan:  Acute metabolic encephalopathy poor appetite ongoing history of smoking with severe emphysema and chronic interstitial lung disease history of lung cancer s/p RT -- sats stable on room air use oxygen as needed-- pt will need to assessed for home oxygen use -- PRN nebs inhalers -- smoking cessation advised   Left great toe injury with fracture and nailbed injury -- Patient noted to have full toenail avulsion of the left hallux s/p removal - Continue empiric antibiotic therapy with doxycycline  to complete a 10-day  course of therapy - Follow-up with podiatry as an outpatient    GERD without esophagitis - continue PPI therapy.   Left shoulder pain--chronic --f/u shoulder xray--likley has Rotator cuff tear. --pt has not followed up with ortho or pcp for this--has been having pain for couple years --will need out pt  ortho f/u   Chronic obstructive pulmonary disease (COPD) (HCC)/severe emphysema -  will continue DuoNebs. --assess for home oxygen use   Arteriosclerotic dementia with depression (HCC) -- continue Aricept  and Lexapro .    Hypertension - Continue amlodipine , hydrochlorothiazide  and Avapro    Severe malnutrition BMI 17.90 kg/m2 In the context of chronic illness, lung cancer, end-stage COPD as evidenced by moderate to severe fat depletion and moderate to severe muscle depletion Continue dysphagia 3 diet Continue Ensure Plus high-protein p.o. twice daily and Magic cup 3 times daily Continue MVI    Hypokalemia Secondary to diuretic use Supplement potassium Check magnesium  level     Subjective: No new complaint  Physical Exam: Vitals:   09/11/24 0509 09/11/24 0727 09/11/24 0758 09/11/24 1009  BP: (!) 145/71  (!) 154/76   Pulse: 80  71   Resp: 20  18   Temp: 98.5 F (36.9 C)  98.5 F (36.9 C)   TempSrc:      SpO2: 94% 95% 99% 96%  Weight:      Height:       GENERAL:  78 y.o.-year-old patient with no acute distress. Frail malnourished LUNGS: distant l breath sounds bilaterally, no wheezing CARDIOVASCULAR: S1, S2 normal. No murmur   ABDOMEN: Soft, nontender,  nondistended. Bowel sounds present.  EXTREMITIEs    NEUROLOGIC: nonfocal  patient is alert and awake SKIN: as above.     Data Reviewed: Labs reviewed.  Potassium 2.3, magnesium  2.3, Labs reviewed  Family Communication: None  Disposition: Status is: Observation The patient remains OBS appropriate and will d/c before 2 midnights.  Planned Discharge Destination: Skilled nursing facility    Time  spent: 50  minutes  Author: Aimee Somerset, MD 09/11/2024 12:39 PM  For on call review [email or web address].

## 2024-09-12 DIAGNOSIS — G9340 Encephalopathy, unspecified: Secondary | ICD-10-CM | POA: Diagnosis not present

## 2024-09-12 LAB — MAGNESIUM: Magnesium: 2.3 mg/dL (ref 1.7–2.4)

## 2024-09-12 LAB — BASIC METABOLIC PANEL WITH GFR
Anion gap: 9 (ref 5–15)
BUN: 14 mg/dL (ref 8–23)
CO2: 28 mmol/L (ref 22–32)
Calcium: 8.9 mg/dL (ref 8.9–10.3)
Chloride: 97 mmol/L — ABNORMAL LOW (ref 98–111)
Creatinine, Ser: 0.91 mg/dL (ref 0.61–1.24)
GFR, Estimated: >60 mL/min (ref >60)
Glucose, Bld: 84 mg/dL (ref 70–99)
Potassium: 3.3 mmol/L — ABNORMAL LOW (ref 3.5–5.1)
Sodium: 134 mmol/L — ABNORMAL LOW (ref 135–145)

## 2024-09-12 MED ORDER — POTASSIUM CHLORIDE CRYS ER 20 MEQ PO TBCR
40.0000 meq | EXTENDED_RELEASE_TABLET | Freq: Every day | ORAL | Status: DC
  Administered 2024-09-13 – 2024-09-14 (×2): 40 meq via ORAL
  Filled 2024-09-12 (×2): qty 2

## 2024-09-12 MED ORDER — POTASSIUM CHLORIDE CRYS ER 20 MEQ PO TBCR
40.0000 meq | EXTENDED_RELEASE_TABLET | ORAL | Status: AC
  Administered 2024-09-12 (×2): 40 meq via ORAL
  Filled 2024-09-12 (×2): qty 2

## 2024-09-12 MED ORDER — FLUTICASONE FUROATE-VILANTEROL 100-25 MCG/ACT IN AEPB
1.0000 | INHALATION_SPRAY | Freq: Every day | RESPIRATORY_TRACT | Status: DC
  Administered 2024-09-12 – 2024-09-14 (×3): 1 via RESPIRATORY_TRACT
  Filled 2024-09-12: qty 28

## 2024-09-12 NOTE — Plan of Care (Signed)
  Problem: Nutrition: Goal: Adequate nutrition will be maintained Outcome: Progressing   Problem: Coping: Goal: Level of anxiety will decrease Outcome: Progressing   Problem: Pain Managment: Goal: General experience of comfort will improve and/or be controlled Outcome: Progressing   Problem: Safety: Goal: Ability to remain free from injury will improve Outcome: Progressing

## 2024-09-12 NOTE — Progress Notes (Signed)
 Progress Note   Patient: [name] FMW:[id number] DOB: Sep 22, 1946 DOA: 09/09/2024     0 DOS: the patient was seen and examined on 09/12/2024   Brief hospital course:  [name] is a 78 y.o. male with medical history significant for coronary artery disease, depression, hypertension tension, dyslipidemia, GERD, and lung cancer and osteoarthritis, who presented to the emergency room with acute onset of altered mental status and suspected fall.  Per EMS the patient apparently rolled out with his bed.  He could not give much history.  The daughter who is with him in the ER has been out of town for the past few days and had lost last seen her father a few days ago when he was in his normal state of health.  He is usually active and today was significantly different.    Noncontrasted head CT scan was limited due to patient's motion and showed no acute intracranial process.  C-spine CT showed no acute cervical spine fracture but was limited due to patient's motion.     chest x-ray showed the following: 1. New small left pleural effusion. 2. Cavitary mass in the left lung base behind the heart, seen on prior CT, is obscured on today's radiograph. 3. Diffuse interstitial pattern with bronchiectasis, interstitial reticulonodular changes, and coarse infiltrates, unchanged since prior study, consistent with emphysema and chronic interstitial lung disease.      Assessment and Plan:  Acute metabolic encephalopathy poor appetite ongoing history of smoking with severe emphysema and chronic interstitial lung disease history of lung cancer s/p RT Mental status appears to be back to baseline -- sats stable on room air use oxygen as needed-- pt will need to assessed for home oxygen use -- PRN nebs inhalers -- smoking cessation advised    Chronic obstructive pulmonary disease (COPD) (HCC)/severe emphysema Acute bronchitis rule out community-acquired pneumonia Patient has a wet sounding  productive cough but is afebrile Continue empiric antibiotic therapy with doxycycline -  will continue as needed DuoNebs as well as inhaled steroids --assess for home oxygen use   Left great toe injury with fracture and nailbed injury -- Patient noted to have full toenail avulsion of the left hallux s/p removal - Continue empiric antibiotic therapy with doxycycline to complete a 10-day course of therapy, Day 2 - Follow-up with podiatry as an outpatient     GERD without esophagitis - continue PPI therapy.   Left shoulder pain--chronic --f/u shoulder xray--likley has Rotator cuff tear. --pt has not followed up with ortho or pcp for this--has been having pain for couple years --will need out pt  ortho f/u      Arteriosclerotic dementia with depression (HCC) -- continue Aricept  and Lexapro .     Hypertension - Continue amlodipine , hydrochlorothiazide and Avapro      Severe malnutrition BMI 17.90 kg/m2 In the context of chronic illness, lung cancer, end-stage COPD as evidenced by moderate to severe fat depletion and moderate to severe muscle depletion Continue dysphagia 3 diet Continue Ensure Plus high-protein p.o. twice daily and Magic cup 3 times daily Continue MVI       Hypokalemia Secondary to diuretic use Supplement potassium Magnesium  level within normal limits      Physical deconditioning Appreciate PT input Recommend SNF on discharge Discussed with patient in detail and he agrees with the recommendation TOC consult     Subjective: No new complaints.  Has a wet sounding cough  Physical Exam: Vitals:   09/11/24 1447 09/11/24 1956 09/12/24 0516 09/12/24 9263  BP: (!) 122/54 (!) 149/75 (!) 150/75 (!) 147/68  Pulse: 72 68 72 77  Resp: 18 14 16 20   Temp: 98 F (36.7 C) 97.8 F (36.6 C) (!) 97.4 F (36.3 C) 97.8 F (36.6 C)  TempSrc:      SpO2: 97% 95% 97% 100%  Weight:      Height:       GENERAL:  78 y.o.-year-old patient with no acute distress.  Frail malnourished LUNGS: distant l breath sounds bilaterally, scattered wheezing CARDIOVASCULAR: S1, S2 normal. No murmur   ABDOMEN: Soft, nontender, nondistended. Bowel sounds present.  EXTREMITIEs    NEUROLOGIC: nonfocal  patient is alert and awake SKIN: as above.    Data Reviewed: Potassium 3.3, magnesium  2.3 Labs reviewed  Family Communication: Plan of care was discussed with patient's daughter, [name] over the phone.  All questions and concerns have been addressed.  She verbalizes understanding and agrees with the plan  Disposition: Status is: Observation The patient remains OBS appropriate and will d/c before 2 midnights.  Planned Discharge Destination: Skilled nursing facility    Time spent: 50 minutes  Author: Aimee Somerset, MD 09/12/2024 12:17 PM  For on call review [email or web address].

## 2024-09-13 ENCOUNTER — Telehealth (HOSPITAL_COMMUNITY): Payer: Self-pay | Admitting: Pharmacy Technician

## 2024-09-13 ENCOUNTER — Other Ambulatory Visit: Payer: Self-pay | Admitting: Cardiovascular Disease

## 2024-09-13 ENCOUNTER — Other Ambulatory Visit (HOSPITAL_COMMUNITY): Payer: Self-pay

## 2024-09-13 DIAGNOSIS — E7800 Pure hypercholesterolemia, unspecified: Secondary | ICD-10-CM

## 2024-09-13 DIAGNOSIS — I2510 Atherosclerotic heart disease of native coronary artery without angina pectoris: Secondary | ICD-10-CM

## 2024-09-13 DIAGNOSIS — G9341 Metabolic encephalopathy: Secondary | ICD-10-CM | POA: Diagnosis present

## 2024-09-13 DIAGNOSIS — I10 Essential (primary) hypertension: Secondary | ICD-10-CM

## 2024-09-13 LAB — BASIC METABOLIC PANEL WITH GFR
Anion gap: 8 (ref 5–15)
BUN: 17 mg/dL (ref 8–23)
CO2: 29 mmol/L (ref 22–32)
Calcium: 9.3 mg/dL (ref 8.9–10.3)
Chloride: 96 mmol/L — ABNORMAL LOW (ref 98–111)
Creatinine, Ser: 0.9 mg/dL (ref 0.61–1.24)
GFR, Estimated: >60 mL/min (ref >60)
Glucose, Bld: 88 mg/dL (ref 70–99)
Potassium: 3.7 mmol/L (ref 3.5–5.1)
Sodium: 133 mmol/L — ABNORMAL LOW (ref 135–145)

## 2024-09-13 MED ORDER — MORPHINE SULFATE ER 15 MG PO TBCR: 15.0000 mg | EXTENDED_RELEASE_TABLET | Freq: Two times a day (BID) | ORAL | Status: DC

## 2024-09-13 MED ORDER — GABAPENTIN 400 MG PO CAPS
400.0000 mg | ORAL_CAPSULE | Freq: Two times a day (BID) | ORAL | Status: DC
  Administered 2024-09-13 – 2024-09-14 (×3): 400 mg via ORAL
  Filled 2024-09-13 (×3): qty 1

## 2024-09-13 MED ORDER — OXYCODONE-ACETAMINOPHEN 5-325 MG PO TABS
1.0000 | ORAL_TABLET | Freq: Three times a day (TID) | ORAL | Status: DC | PRN
  Administered 2024-09-13 – 2024-09-14 (×2): 1 via ORAL
  Filled 2024-09-13 (×3): qty 1

## 2024-09-13 MED ORDER — MORPHINE SULFATE ER 15 MG PO TBCR
15.0000 mg | EXTENDED_RELEASE_TABLET | Freq: Two times a day (BID) | ORAL | Status: DC
  Administered 2024-09-13 – 2024-09-14 (×3): 15 mg via ORAL
  Filled 2024-09-13 (×3): qty 1

## 2024-09-13 NOTE — Plan of Care (Signed)
  Problem: Health Behavior/Discharge Planning: Goal: Ability to manage health-related needs will improve Outcome: Progressing   Problem: Clinical Measurements: Goal: Ability to maintain clinical measurements within normal limits will improve Outcome: Progressing   Problem: Coping: Goal: Level of anxiety will decrease Outcome: Progressing   Problem: Elimination: Goal: Will not experience complications related to bowel motility Outcome: Progressing   Problem: Elimination: Goal: Will not experience complications related to urinary retention Outcome: Progressing

## 2024-09-13 NOTE — Progress Notes (Addendum)
 Physical Therapy Treatment Patient Details Name: [name] MRN: [id number] DOB: 1946/09/09 Today's Date: 09/13/2024   History of Present Illness 78 y.o. male with medical history significant for coronary artery disease, depression, hypertension tension, dyslipidemia, GERD, and lung cancer and osteoarthritis, who presented to the emergency room with acute onset of altered mental status and suspected fall.  Per EMS the patient apparently rolled out with his bed.  He could not give much history.  The daughter who is with him in the ER has been out of town for the past few days and had lost last seen her father a few days ago when he was in his normal state of health.  He is usually active and today was significantly different.    PT Comments  Pt received in supine asleep. Upon waking pt was pleasant and agreeable to PT. He was able to complete bed mobility with mod I and STS to RW with min A for steadiness. Pt required edu for safe hand placement to RW and frequent cuing to slow down as he is impulsive with gait. Pt able to ambulate for 15 ft to door and back to bed with min A/CGA and RW. Pt reported a baseline pain of 8/10 in his back that increased to 8.5/10 at the end of session. Pulse ox remained >91% on RA but increased to 94-96% consistently with upright activities. Pt left in bed with all needs met and on RA. He will continue to benefit from skilled therapy services to improve activity tolerance and functional strength and mobility.   If plan is discharge home, recommend the following: A little help with walking and/or transfers;A little help with bathing/dressing/bathroom;Direct supervision/assist for medications management   Can travel by private vehicle     Yes  Equipment Recommendations  Other (comment) (TBD at next venue)    Recommendations for Other Services       Precautions / Restrictions Precautions Precautions: Fall Recall of Precautions/Restrictions:  Impaired Restrictions Weight Bearing Restrictions Per Provider Order: No LLE Weight Bearing Per Provider Order: Weight bearing as tolerated     Mobility  Bed Mobility Overal bed mobility: Modified Independent Bed Mobility: Supine to Sit, Sit to Supine     Supine to sit: Modified independent (Device/Increase time) Sit to supine: Modified independent (Device/Increase time)   General bed mobility comments: able to balance EOB without UE support    Transfers Overall transfer level: Needs assistance Equipment used: Rolling walker (2 wheels) Transfers: Sit to/from Stand Sit to Stand: Min assist           General transfer comment: requires reinforcement of cues for hand placement to RW    Ambulation/Gait Ambulation/Gait assistance: Contact guard assist Gait Distance (Feet): 15 Feet Assistive device: Rolling walker (2 wheels) Gait Pattern/deviations: Decreased stride length, Trunk flexed, Narrow base of support       General Gait Details: frequent cuing to slow down steps; pt impulsive with gait   Stairs             Wheelchair Mobility     Tilt Bed    Modified Rankin (Stroke Patients Only)       Balance Overall balance assessment: Needs assistance Sitting-balance support: Feet supported, No upper extremity supported Sitting balance-Leahy Scale: Fair Sitting balance - Comments: able to balance EOB without UE support; no sway noted   Standing balance support: During functional activity, Bilateral upper extremity supported Standing balance-Leahy Scale: Fair Standing balance comment: reliant on RW for support  Communication Communication Communication: No apparent difficulties  Cognition Arousal: Alert Behavior During Therapy: WFL for tasks assessed/performed   PT - Cognitive impairments: Problem solving, Safety/Judgement                       PT - Cognition Comments: requires frequqnt cuing to slow  down Following commands: Impaired Following commands impaired: Follows one step commands with increased time    Cueing Cueing Techniques: Verbal cues  Exercises      General Comments        Pertinent Vitals/Pain Pain Assessment Pain Assessment: 0-10 Pain Score: 8  Pain Location: back Pain Descriptors / Indicators: Grimacing Pain Intervention(s): Limited activity within patient's tolerance, Monitored during session    Home Living                          Prior Function            PT Goals (current goals can now be found in the care plan section) Acute Rehab PT Goals Patient Stated Goal: to feel better PT Goal Formulation: With patient Time For Goal Achievement: 09/24/24 Potential to Achieve Goals: Fair Progress towards PT goals: Progressing toward goals    Frequency    Min 2X/week      PT Plan      Co-evaluation              AM-PAC PT 6 Clicks Mobility   Outcome Measure  Help needed turning from your back to your side while in a flat bed without using bedrails?: None Help needed moving from lying on your back to sitting on the side of a flat bed without using bedrails?: None Help needed moving to and from a bed to a chair (including a wheelchair)?: A Little Help needed standing up from a chair using your arms (e.g., wheelchair or bedside chair)?: A Little Help needed to walk in hospital room?: A Little Help needed climbing 3-5 steps with a railing? : A Lot 6 Click Score: 19    End of Session Equipment Utilized During Treatment: Oxygen Activity Tolerance: Patient limited by fatigue;Patient limited by lethargy Patient left: in bed;with call bell/phone within reach;with bed alarm set Nurse Communication: Mobility status PT Visit Diagnosis: Other abnormalities of gait and mobility (R26.89);Difficulty in walking, not elsewhere classified (R26.2);Muscle weakness (generalized) (M62.81);Unsteadiness on feet (R26.81);History of falling  (Z91.81);Adult, failure to thrive  (R62.7)     Time: 8652-8592 PT Time Calculation (min) (ACUTE ONLY): 20 min  Charges:                            Allena Bulls, SPT  Maryanne Finder, PT, DPT Physical Therapist - [facility]  [facility] 09/13/2024, 3:53 PM

## 2024-09-13 NOTE — Telephone Encounter (Signed)
 Patient Product/process Development Scientist completed.    The patient is insured through [employer].     Ran test claim for Breo Ellipta 100-25 mcg and the current 30 day co-pay is $76.00.   This test claim was processed through [facility]- copay amounts may vary at other pharmacies due to pharmacy/plan contracts, or as the patient moves through the different stages of their insurance plan.     Reyes Sharps, CPHT Pharmacy Technician Patient Advocate Specialist Lead [facility] Patient Advocate Team Direct Number: [phone]  Fax: [phone]

## 2024-09-13 NOTE — NC FL2 (Signed)
 [place]  MEDICAID FL2 LEVEL OF CARE FORM     IDENTIFICATION  Patient Name: [name] Birthdate: 01/03/1946 Sex: male Admission Date (Current Location): 09/09/2024  [place] and [place] Number:  Chiropodist and Address:  [facility], [address], [place], [place] [address]      Provider Number: [id number]  Attending Physician Name and Address:  Tobie Calix, MD  Relative Name and Phone Number:  Marte Celani [phone]    Current Level of Care: Hospital Recommended Level of Care: Skilled Nursing Facility Prior Approval Number:    Date Approved/Denied:   PASRR Number: [id number]  Discharge Plan: SNF    Current Diagnoses: Patient Active Problem List   Diagnosis Date Noted   Hypertensive urgency 09/10/2024   Arteriosclerotic dementia with depression (HCC) 09/10/2024   Dyslipidemia 09/10/2024   Chronic obstructive pulmonary disease (COPD) (HCC) 09/10/2024   Chronic pain 09/10/2024   Acute encephalopathy 09/10/2024   Lower respiratory infection 09/10/2024   Injury of left toe 09/10/2024   Pneumonia 12/09/2023   Severe sepsis (HCC) 12/08/2023   AKI (acute kidney injury) 12/08/2023   Hypokalemia 12/08/2023   Elevated troponin 12/08/2023   Hypercalcemia 12/08/2023   HLD (hyperlipidemia) 12/08/2023   Depression 12/08/2023   Acute bronchitis 08/21/2023   Acute hyponatremia 07/29/2023   Protein-calorie malnutrition, severe 07/29/2023   Weakness 07/28/2023   Headache 07/28/2023   CAP (community acquired pneumonia) 07/28/2023   Hypophosphatemia 06/19/2023   Gastroenteritis due to COVID-19 virus 06/17/2023   Encephalopathy due to COVID-19 virus 06/17/2023   Lung cancer (HCC) 06/04/2023   Preoperative clearance 06/04/2023   Multiple fractures of ribs, left side, initial encounter for closed fracture 01/03/2023   Mediastinal adenopathy 01/03/2023   Aspiration pneumonia (HCC) 01/02/2023   Dementia without behavioral disturbance  (HCC) 01/02/2023   GERD without esophagitis 01/02/2023   COPD with acute exacerbation (HCC) 01/02/2023   Alcohol withdrawal delirium (HCC) 01/11/2020   Lobar pneumonia, unspecified organism 01/11/2020   Acute hypoxic respiratory failure (HCC) 01/11/2020   Acute pulmonary edema (HCC) 01/11/2020   Toxic metabolic encephalopathy 01/11/2020   Anemia of chronic disease 01/11/2020   Narcotic dependency, continuous (HCC) 01/11/2020   Centrilobular emphysema (HCC) 02/13/2017   Chronic bilateral low back pain with right-sided sciatica 02/13/2017   Gastroesophageal reflux disease 02/13/2017   Major depressive disorder, recurrent, in full remission 02/13/2017   CAD (coronary artery disease) 03/24/2011   Tobacco user 03/24/2011   Pure hypercholesterolemia 03/24/2011    Orientation RESPIRATION BLADDER Height & Weight     Self, Time, Situation, Place  O2 (2 Liters Nasal Cannula) Incontinent Weight: 110 lb 14.3 oz (50.3 kg) Height:  5' 6 (167.6 cm)  BEHAVIORAL SYMPTOMS/MOOD NEUROLOGICAL BOWEL NUTRITION STATUS      Continent Diet (DIET DYS 3 Fluid consistency: Thin: Dysphagia starting at 11/28 1535)  AMBULATORY STATUS COMMUNICATION OF NEEDS Skin   Limited Assist   Normal                       Personal Care Assistance Level of Assistance  Bathing, Feeding, Dressing Bathing Assistance: Maximum assistance Feeding assistance: Maximum assistance Dressing Assistance: Maximum assistance     Functional Limitations Info  Sight, Hearing, Speech Sight Info: Adequate Hearing Info: Adequate Speech Info: Adequate    SPECIAL CARE FACTORS FREQUENCY  PT (By licensed PT), OT (By licensed OT)     PT Frequency: 5x OT Frequency: 5x  Contractures Contractures Info: Not present    Additional Factors Info  Code Status Code Status Info: DNR Intervene             Current Medications (09/13/2024):  This is the current hospital active medication list Current Facility-Administered  Medications  Medication Dose Route Frequency Provider Last Rate Last Admin   acetaminophen  (TYLENOL ) tablet 650 mg  650 mg Oral Q6H PRN Mansy, Jan A, MD       Or   acetaminophen  (TYLENOL ) suppository 650 mg  650 mg Rectal Q6H PRN Mansy, Madison LABOR, MD       aspirin  EC tablet 81 mg  81 mg Oral Daily Mansy, Jan A, MD   81 mg at 09/13/24 0957   chlorpheniramine-HYDROcodone (TUSSIONEX) 10-8 MG/5ML suspension 5 mL  5 mL Oral Q12H PRN Mansy, Jan A, MD       cyanocobalamin  (VITAMIN B12) tablet 500 mcg  500 mcg Oral BID Mansy, Jan A, MD   500 mcg at 09/13/24 0957   donepezil  (ARICEPT ) tablet 5 mg  5 mg Oral Daily Mansy, Jan A, MD   5 mg at 09/13/24 0957   doxycycline (VIBRA-TABS) tablet 100 mg  100 mg Oral Q12H Agbata, Tochukwu, MD   100 mg at 09/13/24 0956   enoxaparin  (LOVENOX ) injection 40 mg  40 mg Subcutaneous Q24H Mansy, Jan A, MD   40 mg at 09/12/24 2217   escitalopram  (LEXAPRO ) tablet 20 mg  20 mg Oral q AM Mansy, Jan A, MD   20 mg at 09/13/24 9378   feeding supplement (ENSURE PLUS HIGH PROTEIN) liquid 237 mL  237 mL Oral TID BM Patel, Sona, MD   237 mL at 09/13/24 1300   fluticasone furoate-vilanterol (BREO ELLIPTA) 100-25 MCG/ACT 1 puff  1 puff Inhalation Daily Agbata, Tochukwu, MD   1 puff at 09/13/24 0737   folic acid  (FOLVITE ) tablet 1 mg  1 mg Oral Daily Mansy, Jan A, MD   1 mg at 09/13/24 0957   gabapentin  (NEURONTIN ) capsule 400 mg  400 mg Oral BID Patel, Sona, MD   400 mg at 09/13/24 1132   guaiFENesin  (MUCINEX ) 12 hr tablet 600 mg  600 mg Oral BID Mansy, Jan A, MD   600 mg at 09/13/24 9043   irbesartan  (AVAPRO ) tablet 300 mg  300 mg Oral Daily Niels Kayla FALCON, RPH   300 mg at 09/12/24 1145   And   hydrochlorothiazide (HYDRODIURIL) tablet 25 mg  25 mg Oral Daily Greenwood, Howard F, RPH   25 mg at 09/12/24 1027   ipratropium-albuterol  (DUONEB) 0.5-2.5 (3) MG/3ML nebulizer solution 3 mL  3 mL Nebulization Q6H PRN Agbata, Tochukwu, MD       magnesium  hydroxide (MILK OF MAGNESIA) suspension  30 mL  30 mL Oral Daily PRN Mansy, Jan A, MD       montelukast  (SINGULAIR ) tablet 10 mg  10 mg Oral QHS Mansy, Jan A, MD   10 mg at 09/12/24 2216   morphine  (MS CONTIN ) 12 hr tablet 15 mg  15 mg Oral Q12H Patel, Sona, MD   15 mg at 09/13/24 9043   multivitamin with minerals tablet 1 tablet  1 tablet Oral Daily Patel, Sona, MD   1 tablet at 09/13/24 0957   neomycin -bacitracin-polymyxin [phone] OINT 1 Application  1 Application Apply externally Daily Regal, Andrew J, DPM   1 Application at 09/13/24 9043   ondansetron  (ZOFRAN ) tablet 4 mg  4 mg Oral Q6H PRN Mansy, Madison LABOR, MD  Or   ondansetron  (ZOFRAN ) injection 4 mg  4 mg Intravenous Q6H PRN Mansy, Jan A, MD   4 mg at 09/12/24 0218   oxyCODONE -acetaminophen  (PERCOCET/ROXICET) 5-325 MG per tablet 1 tablet  1 tablet Oral Q8H PRN Patel, Sona, MD       pantoprazole  (PROTONIX ) EC tablet 40 mg  40 mg Oral Daily Patel, Sona, MD   40 mg at 09/13/24 0957   potassium chloride  SA (KLOR-CON  M) CR tablet 40 mEq  40 mEq Oral Daily Agbata, Tochukwu, MD   40 mEq at 09/13/24 0957   rosuvastatin  (CRESTOR ) tablet 40 mg  40 mg Oral QPM Mansy, Jan A, MD   40 mg at 09/12/24 1705   traZODone  (DESYREL ) tablet 25 mg  25 mg Oral QHS PRN Mansy, Jan A, MD   25 mg at 09/12/24 2216   [START ON 09/15/2024] Vitamin D  (Ergocalciferol ) (DRISDOL ) 1.25 MG (50000 UNIT) capsule 50,000 Units  50,000 Units Oral Q Wed Mansy, Madison LABOR, MD         Discharge Medications: Please see discharge summary for a list of discharge medications.  Relevant Imaging Results:  Relevant Lab Results:   Additional Information [id number]  Alfonso Rummer, LCSW

## 2024-09-13 NOTE — Progress Notes (Signed)
 [facility]  - [place] at [facility]   PATIENT NAME: [name]    MR#:  [id number]  DATE OF BIRTH:  Mar 01, 1946  SUBJECTIVE:  spoke with patient's daughter [name] at bedside  patient more awake. Tolerating PO diet quite well. Working with physical therapy recommends rehab. Discussed options for discharge planning with patient and daughter. Patient is considering rehab. TOC informed.    VITALS:  Blood pressure (!) 111/57, pulse 75, temperature 97.8 F (36.6 C), resp. rate 20, height 5' 6 (1.676 m), weight 50.3 kg, SpO2 98%.  PHYSICAL EXAMINATION:   GENERAL:  78 y.o.-year-old patient with no acute distress. Frail malnourished LUNGS: distant l breath sounds bilaterally, no wheezing CARDIOVASCULAR: S1, S2 normal. No murmur   ABDOMEN: Soft, nontender, nondistended. Bowel sounds present.  EXTREMITIEs    NEUROLOGIC: nonfocal  patient is alert and awake SKIN: as above.   LABORATORY PANEL:  CBC Recent Labs  Lab 09/10/24 0411  WBC 5.2  HGB 10.1*  HCT 29.6*  PLT 167    Chemistries  Recent Labs  Lab 09/09/24 2004 09/10/24 0411 09/12/24 0320 09/13/24 0452  NA 135   < > 134* 133*  K 3.5   < > 3.3* 3.7  CL 100   < > 97* 96*  CO2 25   < > 28 29  GLUCOSE 88   < > 84 88  BUN 15   < > 14 17  CREATININE 1.04   < > 0.91 0.90  CALCIUM  9.2   < > 8.9 9.3  MG  --    < > 2.3  --   AST 26  --   --   --   ALT 9  --   --   --   ALKPHOS 102  --   --   --   BILITOT 0.5  --   --   --    < > = values in this interval not displayed.   Cardiac Enzymes No results for input(s): TROPONINI in the last 168 hours. RADIOLOGY:  No results found.   Assessment and Plan  [name] is a 78 y.o. male with medical history significant for coronary artery disease, depression, hypertension tension, dyslipidemia, GERD, and lung cancer and osteoarthritis, who presented to the emergency room with acute onset of altered mental status and suspected fall.  Per EMS the patient  apparently rolled out with his bed.  He could not give much history.  The daughter who is with him in the ER has been out of town for the past few days and had lost last seen her father a few days ago when he was in his normal state of health.  He is usually active and today was significantly different.   Noncontrasted head CT scan was limited due to patient's motion and showed no acute intracranial process.  C-spine CT showed no acute cervical spine fracture but was limited due to patient's motion.    chest x-ray showed the following: 1. New small left pleural effusion. 2. Cavitary mass in the left lung base behind the heart, seen on prior CT, is obscured on today's radiograph. 3. Diffuse interstitial pattern with bronchiectasis, interstitial reticulonodular changes, and coarse infiltrates, unchanged since prior study, consistent with emphysema and chronic interstitial lung disease.   Acute metabolic encephalopathy poor appetite ongoing history of smoking with severe emphysema and chronic interstitial lung disease history of lung cancer s/p RT -- PO Zithromax  empirically for presume lung infection -- sats stable  on room air use oxygen as needed-- pt will need to assessed for home oxygen use -- PRN nebs inhalers -- smoking cessation advised  Left great toe injury with fracture and nailbed injury --Seen by Dr Magdalen Podiatry--will remove nail bed, empiric bactrim  for 10 days, PT to see and WBAT on left foot --overall improving  Dyslipidemia -  continue statin therapy.   GERD without esophagitis - continue PPI therapy.   Left shoulder pain--chronic --f/u shoulder xray--likley has Rotator cuff tear --pt has not followed up with ortho or pcp for this--has been having pain for couple years --will need out pt  ortho f/u   Chronic obstructive pulmonary disease (COPD) (HCC)/severe emphysema -  will continue DuoNebs. --assess for home oxygen use   Arteriosclerotic dementia with  depression (HCC) -- continue Aricept  and Lexapro .  Nutrition Status: Nutrition Problem: Severe Malnutrition Etiology: chronic illness (lung cancer) Signs/Symptoms: moderate fat depletion, severe fat depletion, moderate muscle depletion, severe muscle depletion Interventions: Ensure Enlive (each supplement provides 350kcal and 20 grams of protein), Magic cup, MVI, Liberalize Diet   Procedures: Family communication :Music Therapist dter Consults :Podiatry CODE STATUS: DNR DVT Prophylaxis :lovenox  Level of care: Telemetry Status is: Observation The patient remains OBS appropriate and will d/c before 2 midnights.    TOTAL TIME TAKING CARE OF THIS PATIENT: 40 minutes.  >50% time spent on counselling and coordination of care  Note: This dictation was prepared with Dragon dictation along with smaller phrase technology. Any transcriptional errors that result from this process are unintentional.  Leita Blanch M.D    [facility]   CC: Primary care physician; Center, [facility]

## 2024-09-13 NOTE — TOC Initial Note (Signed)
 Transition of Care [facility]) - Initial/Assessment Note    Patient Details  Name: [name] MRN: [id number] Date of Birth: [birth year]  Transition of Care [facility]) CM/SW Contact:    Alfonso Rummer, LCSW Phone Number: 09/13/2024, 1:39 PM  Clinical Narrative:                 Completed toc visit with pt at bedside. [facility] [place]. Pt is agreeable to transiting to SNF for short term rehab. LCSW A Naturi Alarid spk with pt daughter she is in agreement also with skilled nursing facility. Pt is requesting to transition to [facility] [place] or [facility].         Patient Goals and CMS Choice            Expected Discharge Plan and Services                                              Prior Living Arrangements/Services                       Activities of Daily Living      Permission Sought/Granted                  Emotional Assessment              Admission diagnosis:  Altered mental status, unspecified altered mental status type [R41.82] Acute metabolic encephalopathy [G93.41] Patient Active Problem List   Diagnosis Date Noted   Hypertensive urgency 09/10/2024   Arteriosclerotic dementia with depression (HCC) 09/10/2024   Dyslipidemia 09/10/2024   Chronic obstructive pulmonary disease (COPD) (HCC) 09/10/2024   Chronic pain 09/10/2024   Acute encephalopathy 09/10/2024   Lower respiratory infection 09/10/2024   Injury of left toe 09/10/2024   Pneumonia 12/09/2023   Severe sepsis (HCC) 12/08/2023   AKI (acute kidney injury) 12/08/2023   Hypokalemia 12/08/2023   Elevated troponin 12/08/2023   Hypercalcemia 12/08/2023   HLD (hyperlipidemia) 12/08/2023   Depression 12/08/2023   Acute bronchitis 08/21/2023   Acute hyponatremia 07/29/2023   Protein-calorie malnutrition, severe 07/29/2023   Weakness 07/28/2023   Headache 07/28/2023   CAP (community acquired pneumonia) 07/28/2023   Hypophosphatemia 06/19/2023    Gastroenteritis due to COVID-19 virus 06/17/2023   Encephalopathy due to COVID-19 virus 06/17/2023   Lung cancer (HCC) 06/04/2023   Preoperative clearance 06/04/2023   Multiple fractures of ribs, left side, initial encounter for closed fracture 01/03/2023   Mediastinal adenopathy 01/03/2023   Aspiration pneumonia (HCC) 01/02/2023   Dementia without behavioral disturbance (HCC) 01/02/2023   GERD without esophagitis 01/02/2023   COPD with acute exacerbation (HCC) 01/02/2023   Alcohol withdrawal delirium (HCC) 01/11/2020   Lobar pneumonia, unspecified organism 01/11/2020   Acute hypoxic respiratory failure (HCC) 01/11/2020   Acute pulmonary edema (HCC) 01/11/2020   Toxic metabolic encephalopathy 01/11/2020   Anemia of chronic disease 01/11/2020   Narcotic dependency, continuous (HCC) 01/11/2020   Centrilobular emphysema (HCC) 02/13/2017   Chronic bilateral low back pain with right-sided sciatica 02/13/2017   Gastroesophageal reflux disease 02/13/2017   Major depressive disorder, recurrent, in full remission 02/13/2017   CAD (coronary artery disease) 03/24/2011   Tobacco user 03/24/2011   Pure hypercholesterolemia 03/24/2011   PCP:  [facility], [facility]:   [facility] - [place], [place] - [address] [place] [address] Phone: [phone] Fax: [phone]  [facility] [address] [place], [place] - [address] AT [facility] [address] [place] [address] Phone: [phone] Fax: [phone]  [facility] [address] [place] [address] Phone: [phone] Fax: [phone]  [facility] [address] [place] [address] Phone: [phone] Fax: [phone]  [id number] [place], [place] - [address] AT [facility] [address] [place] [address] Phone:  [phone] Fax: [phone]     Social Drivers of Health (SDOH) Social History: SDOH Screenings   Food Insecurity: Patient Unable To Answer (09/10/2024)  Housing: Patient Unable To Answer (09/10/2024)  Transportation Needs: Patient Unable To Answer (09/10/2024)  Utilities: Patient Unable To Answer (09/10/2024)  Financial Resource Strain: Low Risk  (08/13/2023)   Received from [facility]  Social Connections: Patient Unable To Answer (09/10/2024)  Tobacco Use: High Risk (08/30/2024)   SDOH Interventions:     Readmission Risk Interventions    07/29/2023    3:36 PM  Readmission Risk Prevention Plan  Transportation Screening Complete  Medication Review (RN Care Manager) Complete  PCP or Specialist appointment within 3-5 days of discharge Complete  SW Recovery Care/Counseling Consult Complete  Skilled Nursing Facility Complete

## 2024-09-14 DIAGNOSIS — S92425B Nondisplaced fracture of distal phalanx of left great toe, initial encounter for open fracture: Secondary | ICD-10-CM

## 2024-09-14 LAB — CULTURE, BLOOD (ROUTINE X 2)
Culture: NO GROWTH
Culture: NO GROWTH

## 2024-09-14 MED ORDER — OXYCODONE-ACETAMINOPHEN 5-325 MG PO TABS: 2.0000 | ORAL_TABLET | Freq: Three times a day (TID) | ORAL | 0 refills | Status: DC

## 2024-09-14 MED ORDER — DOXYCYCLINE HYCLATE 100 MG PO TABS: 100.0000 mg | ORAL_TABLET | Freq: Two times a day (BID) | ORAL | 0 refills | Status: AC

## 2024-09-14 MED ORDER — ADULT MULTIVITAMIN W/MINERALS CH: 1.0000 | ORAL_TABLET | Freq: Every day | ORAL | 0 refills | Status: DC

## 2024-09-14 MED ORDER — TRIPLE ANTIBIOTIC 3.5-400-5000 EX OINT: 1.0000 | TOPICAL_OINTMENT | Freq: Every day | CUTANEOUS | 0 refills | Status: DC

## 2024-09-14 MED ORDER — FLUTICASONE-SALMETEROL 100-50 MCG/ACT IN AEPB: 1.0000 | INHALATION_SPRAY | Freq: Two times a day (BID) | RESPIRATORY_TRACT | 1 refills | Status: DC

## 2024-09-14 MED ORDER — MORPHINE SULFATE ER 15 MG PO TBCR: 15.0000 mg | EXTENDED_RELEASE_TABLET | Freq: Two times a day (BID) | ORAL | 0 refills | Status: DC

## 2024-09-14 MED ORDER — ENSURE PLUS HIGH PROTEIN PO LIQD: 237.0000 mL | Freq: Three times a day (TID) | ORAL | 0 refills | Status: DC

## 2024-09-14 NOTE — Progress Notes (Signed)
 [facility]  -  at [facility]   PATIENT NAME: [name]    MR#:  [id number]  DATE OF BIRTH:  13-Mar-1946  SUBJECTIVE:  No family today during my visit patient more awake. Tolerating PO diet quite well.  Overall eating better   VITALS:  Blood pressure (!) 139/57, pulse 84, temperature 99 F (37.2 C), temperature source Oral, resp. rate 18, height 5' 6 (1.676 m), weight 50.3 kg, SpO2 91%.  PHYSICAL EXAMINATION:   GENERAL:  78 y.o.-year-old patient with no acute distress. Frail malnourished LUNGS: distant l breath sounds bilaterally CARDIOVASCULAR: S1, S2 normal. No murmur   ABDOMEN: Soft, nontender, nondistended.  EXTREMITIEs  left great toe dressing+  NEUROLOGIC: nonfocal  patient is alert and awake SKIN: as above.   LABORATORY PANEL:  CBC Recent Labs  Lab 09/10/24 0411  WBC 5.2  HGB 10.1*  HCT 29.6*  PLT 167    Chemistries  Recent Labs  Lab 09/09/24 2004 09/10/24 0411 09/12/24 0320 09/13/24 0452  NA 135   < > 134* 133*  K 3.5   < > 3.3* 3.7  CL 100   < > 97* 96*  CO2 25   < > 28 29  GLUCOSE 88   < > 84 88  BUN 15   < > 14 17  CREATININE 1.04   < > 0.91 0.90  CALCIUM  9.2   < > 8.9 9.3  MG  --    < > 2.3  --   AST 26  --   --   --   ALT 9  --   --   --   ALKPHOS 102  --   --   --   BILITOT 0.5  --   --   --    < > = values in this interval not displayed.   Cardiac Enzymes No results for input(s): TROPONINI in the last 168 hours. RADIOLOGY:  No results found.   Assessment and Plan  [name] is a 78 y.o. male with medical history significant for coronary artery disease, depression, hypertension tension, dyslipidemia, GERD, and lung cancer and osteoarthritis, who presented to the emergency room with acute onset of altered mental status and suspected fall.  Per EMS the patient apparently rolled out with his bed.  He could not give much history.  The daughter who is with him in the ER has been out of town for the past few  days and had lost last seen her father a few days ago when he was in his normal state of health.  He is usually active and today was significantly different.   Noncontrasted head CT scan was limited due to patient's motion and showed no acute intracranial process.  C-spine CT showed no acute cervical spine fracture but was limited due to patient's motion.    chest x-ray showed the following: 1. New small left pleural effusion. 2. Cavitary mass in the left lung base behind the heart, seen on prior CT, is obscured on today's radiograph. 3. Diffuse interstitial pattern with bronchiectasis, interstitial reticulonodular changes, and coarse infiltrates, unchanged since prior study, consistent with emphysema and chronic interstitial lung disease.   Acute metabolic encephalopathy poor appetite ongoing history of smoking with severe emphysema and chronic interstitial lung disease history of lung cancer s/p RT -- PO Zithromax  empirically for presume lung infection--completed -- sats stable on room air use oxygen as needed-- pt will need to assessed for home oxygen use -- PRN nebs  inhalers -- smoking cessation advised  Left great toe injury with fracture and nailbed injury --Seen by Dr Magdalen Podiatry--will remove nail bed, empiric doxycycline for 10 days, PT to see and WBAT on left foot --overall improving--cont dressing changes per Podiatry recs --Left hallux- please replace dressing with triple abx ointment, 4x4 gauze, and coban or kling once daily. Once discharged he can place abx ointment and a bandaid on it once daily   Dyslipidemia -  continue statin therapy.   GERD without esophagitis - continue PPI therapy.   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 --assess for home oxygen use   Arteriosclerotic dementia with depression (HCC) -- continue Aricept  and Lexapro .  Nutrition Status: Nutrition Problem: Severe Malnutrition Etiology: chronic illness (lung cancer) Signs/Symptoms: moderate fat depletion, severe fat depletion, moderate muscle depletion, severe muscle depletion Interventions: Ensure Enlive (each supplement provides 350kcal and 20 grams of protein), Magic cup, MVI, Liberalize Diet   Procedures: Family communication :Music Therapist dter Consults :Podiatry CODE STATUS: DNR DVT Prophylaxis :lovenox  Level of care: Telemetry Status is: Observation The patient remains OBS appropriate and will d/c before 2 midnights.  Pt is best at baseline for d/c    TOTAL TIME TAKING CARE OF THIS PATIENT:35 minutes.  >50% time spent on counselling and coordination of care  Note: This dictation was prepared with Dragon dictation along with smaller phrase technology. Any transcriptional errors that result from this process are unintentional.  Leita Blanch M.D    [facility]   CC: Primary care physician; Center, [facility]

## 2024-09-14 NOTE — Progress Notes (Signed)
 PT Cancellation Note  Patient Details Name: KIP CROPP MRN: [id number] DOB: [birth year]   Cancelled Treatment:    Reason Eval/Treat Not Completed: Pain limiting ability to participate  Pt lightly sleeping but awakens to voice.  Daughter in room.  Pt pleasantly declined interventions at this time.  Will return at a later time/date.    Lauraine Gills 09/14/2024, 10:44 AM

## 2024-09-14 NOTE — Discharge Summary (Signed)
 Physician Discharge Summary   Patient: [name] MRN: [id number] DOB: [birth year]  Admit date:     09/09/2024  Discharge date: 09/14/24  Discharge Physician: Leita Blanch   PCP: [facility], [facility]   Recommendations at discharge:    F/u PCP at [facility] in 1-2 weeks F/u Dr Magdalen Podiatry in 1-2 weeks left great toe fracture USe oxygen to keeps stas >92%  Discharge Diagnoses: Principal Problem:   Acute encephalopathy Active Problems:   Hypertensive urgency   Dyslipidemia   Lower respiratory infection   GERD without esophagitis   Arteriosclerotic dementia with depression (HCC)   Chronic obstructive pulmonary disease (COPD) (HCC)   Chronic pain   Injury of left toe   Acute metabolic encephalopathy  [name] is a 78 y.o. male with medical history significant for coronary artery disease, depression, hypertension tension, dyslipidemia, GERD, and lung cancer and osteoarthritis, who presented to the emergency room with acute onset of altered mental status and suspected fall.  Per EMS the patient apparently rolled out with his bed.  He could not give much history.  The daughter who is with him in the ER has been out of town for the past few days and had lost last seen her father a few days ago when he was in his normal state of health.  He is usually active and today was significantly different.    Noncontrasted head CT scan was limited due to patient's motion and showed no acute intracranial process.  C-spine CT showed no acute cervical spine fracture but was limited due to patient's motion.     chest x-ray showed the following: 1. New small left pleural effusion. 2. Cavitary mass in the left lung base behind the heart, seen on prior CT, is obscured on today's radiograph. 3. Diffuse interstitial pattern with bronchiectasis, interstitial reticulonodular changes, and coarse infiltrates, unchanged since prior study, consistent with emphysema and chronic interstitial lung  disease.   Acute metabolic encephalopathy poor appetite ongoing history of smoking with severe emphysema and chronic interstitial lung disease history of lung cancer s/p RT -- PO Zithromax  empirically for presume lung infection--completed -- sats stable on room air use oxygen as needed -- PRN nebs inhalers -- smoking cessation advised   Left great toe injury with fracture and nailbed injury --Seen by Dr Magdalen Podiatry--will remove nail bed, empiric doxycycline for 10 days, PT to see and WBAT on left foot --overall improving--cont dressing changes per Podiatry recs --Left hallux- please replace dressing with triple abx ointment, 4x4 gauze, and coban or kling once daily. Once discharged he can place abx ointment and a bandaid on it once daily    Dyslipidemia -  continue statin therapy.   GERD without esophagitis - continue PPI therapy.   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 --use oxygen to keeps sats >92%   Arteriosclerotic dementia with depression (HCC) -- continue home meds   Nutrition Status: Nutrition Problem: Severe Malnutrition Etiology: chronic illness (lung cancer) Signs/Symptoms: moderate fat depletion, severe fat depletion, moderate muscle depletion, severe muscle depletion Interventions: Ensure Enlive (each supplement provides 350kcal and 20 grams of protein), Magic cup, MVI, Liberalize Diet  Chronic pain/chronic opioid dependence --pt on MS  contin and oxycodone   D/c to Rehab today     Procedures: Family communication :Rosaline dter 12/1 Consults :Podiatry CODE STATUS: DNR DVT Prophylaxis :lovenox     Pain control - [place]  Controlled  Substance Reporting System database was reviewed. and patient was instructed, not to drive, operate heavy machinery, perform activities at heights, swimming or participation in water activities or provide baby-sitting services while on Pain, Sleep and Anxiety Medications; until their outpatient Physician has advised to do so again. Also recommended to not to take more than prescribed Pain, Sleep and Anxiety Medications.  Disposition: Rehabilitation facility Diet recommendation:  Discharge Diet Orders (From admission, onward)     Start     Ordered   09/14/24 0000  Diet - low sodium heart healthy        09/14/24 1240           Cardiac diet DISCHARGE MEDICATION: Allergies as of 09/14/2024       Reactions   Levofloxacin Itching, Rash   Doxycycline Nausea And Vomiting        Medication List     STOP taking these medications    amLODipine  10 MG tablet Commonly known as: NORVASC    promethazine  25 MG tablet Commonly known as: PHENERGAN    UNABLE TO FIND       TAKE these medications    acetaminophen  500 MG tablet Commonly known as: TYLENOL  Take 1 tablet (500 mg total) by mouth every 8 (eight) hours as needed for mild pain (pain score 1-3) or moderate pain (pain score 4-6).   aspirin  EC 81 MG tablet Take 1 tablet (81 mg total) by mouth daily. Swallow whole.   cyanocobalamin  1000 MCG tablet Take 0.5 tablets (500 mcg total) by mouth 2 (two) times daily. VITAMIN B-12   donepezil  10 MG tablet Commonly known as: ARICEPT  Take 5 mg by mouth daily.   doxycycline 100 MG tablet Commonly known as: VIBRA-TABS Take 1 tablet (100 mg total) by mouth every 12 (twelve) hours for 6 days.   escitalopram  20 MG tablet Commonly known as: LEXAPRO  Take 1 tablet (20 mg total) by mouth daily. What changed: when to take this   esomeprazole  20 MG capsule Commonly known as: NEXIUM  Take 20 mg by mouth daily at 12 noon.   feeding supplement Liqd Take 237 mLs by mouth 3 (three) times daily  between meals.   fluticasone-salmeterol 100-50 MCG/ACT Aepb Commonly known as: ADVAIR Inhale 1 puff into the lungs 2 (two) times daily.   folic acid  1 MG tablet Commonly known as: FOLVITE  Take 1 mg by mouth daily.   gabapentin  400 MG capsule Commonly known as: NEURONTIN  Take 400 mg by mouth 2 (two) times daily.   guaiFENesin  600 MG 12 hr tablet Commonly known as: MUCINEX  Take 600 mg by mouth 2 (two) times daily as needed for cough.   ibuprofen  800 MG tablet Commonly known as: ADVIL  Take 800 mg by mouth every 6 (six) hours as needed for mild pain (pain score 1-3), moderate pain (pain score 4-6) or headache. Takes with Oxy if needed   ipratropium-albuterol  0.5-2.5 (3) MG/3ML Soln Commonly known as: DUONEB Take 3 mLs by nebulization 2 (two) times daily. What changed:  when to take this reasons to take this   montelukast  10 MG tablet Commonly known as: SINGULAIR  TAKE 1 TABLET AT BEDTIME   morphine  15 MG 12 hr tablet Commonly known as: MS CONTIN  Take 1 tablet (15 mg total) by mouth 2 (two) times daily.   multivitamin with  minerals Tabs tablet Take 1 tablet by mouth daily. Start taking on: September 15, 2024   neomycin -bacitracin-polymyxin [phone] Oint Apply 1 Application topically daily.   oxyCODONE -acetaminophen  5-325 MG tablet Commonly known as: PERCOCET/ROXICET Take 2 tablets by mouth in the morning, at noon, and at bedtime. For pain   rosuvastatin  40 MG tablet Commonly known as: CRESTOR  Take 1 tablet (40 mg total) by mouth every evening.   valsartan -hydrochlorothiazide 320-25 MG tablet Commonly known as: DIOVAN -HCT Take 1 tablet by mouth daily.   Vitamin D  (Ergocalciferol ) 1.25 MG (50000 UNIT) Caps capsule Commonly known as: DRISDOL  Take 50,000 Units by mouth every Wednesday.               Discharge Care Instructions  (From admission, onward)           Start     Ordered   09/14/24 0000  Discharge wound care:       Comments: 09/11/24 1000     Wound care  Daily      Comments: Left hallux- please replace dressing with triple abx ointment, 4x4 gauze, and coban or kling once daily. Once discharged he can place abx ointment and a bandaid on it once daily  09/10/24 2356   09/14/24 1240            Contact information for follow-up providers     Center, [facility]. Schedule an appointment as soon as possible for a visit in 1 week(s).   Specialty: General Practice Contact information: [address] [place] [address] [phone]         Magdalen Pasco RAMAN, DPM. Schedule an appointment as soon as possible for a visit in 2 week(s).   Specialty: Podiatry Why: left great toe fracture Contact information: [address] [place] [address] [phone]              Contact information for after-discharge care     Destination     [facility]   Service: Skilled Nursing Contact information: [address] [place]  [phone]                     Filed Weights   09/09/24 2013  Weight: 50.3 kg     Condition at discharge: fair  The results of significant diagnostics from this hospitalization (including imaging, microbiology, ancillary and laboratory) are listed below for reference.   Imaging Studies: DG Foot Complete Left Result Date: 09/10/2024 CLINICAL DATA:  Fall from bed with left foot and toe pain EXAM: LEFT FOOT - COMPLETE 3 VIEW COMPARISON:  None Available. FINDINGS: Impacted fracture of the great toe distal phalangeal base. No acute dislocation. Soft tissues are unremarkable. IMPRESSION: Impacted fracture of the great toe distal phalangeal base. Electronically Signed   By: Limin  Xu M.D.   On: 09/10/2024 17:57   DG Shoulder Left Result Date: 09/10/2024 CLINICAL DATA:  Fall from bed with left shoulder pain EXAM: LEFT SHOULDER - 3 VIEW COMPARISON:  None Available. FINDINGS: There is no evidence of fracture or dislocation. Severe  glenohumeral joint space narrowing. Soft tissues are unremarkable. IMPRESSION: 1. No acute fracture or dislocation. 2. Severe glenohumeral joint space narrowing. Electronically Signed   By: Limin  Xu M.D.   On: 09/10/2024 17:53   DG Chest Port 1 View Result Date: 09/09/2024 EXAM: 1 VIEW(S) XRAY OF THE CHEST 09/09/2024 08:44:00 PM COMPARISON: CT chest 04/08/2024 and chest radiograph 12/07/2023. CLINICAL HISTORY: Questionable sepsis - evaluate for abnormality. FINDINGS: LUNGS AND  PLEURA: Diffuse emphysematous changes throughout the lungs. Diffuse interstitial pattern to the lungs with bronchiectasis and interstitial reticulonodular changes as well as coarse infiltrates. This is unchanged since the prior study and consistent with chronic interstitial lung disease. There is a new small left pleural effusion. The cavitary mass seen on prior CT the left lung base behind the heart is obscured on today's radiograph. No pneumothorax. HEART AND MEDIASTINUM: Heart size is normal for the technique. Calcification of the aorta. BONES AND SOFT TISSUES: Postoperative changes in the lumbar spine. No acute osseous abnormality. IMPRESSION: 1. New small left pleural effusion. 2. Cavitary mass in the left lung base behind the heart, seen on prior CT, is obscured on today's radiograph. 3. Diffuse interstitial pattern with bronchiectasis, interstitial reticulonodular changes, and coarse infiltrates, unchanged since prior study, consistent with emphysema and chronic interstitial lung disease. Electronically signed by: Elsie Gravely MD 09/09/2024 08:50 PM EST RP Workstation: [id number]   CT Cervical Spine Wo Contrast Result Date: 09/09/2024 CLINICAL DATA:  Clemens out of bed EXAM: CT CERVICAL SPINE WITHOUT CONTRAST TECHNIQUE: Multidetector CT imaging of the cervical spine was performed without intravenous contrast. Multiplanar CT image reconstructions were also generated. RADIATION DOSE REDUCTION: This exam was performed according to  the departmental dose-optimization program which includes automated exposure control, adjustment of the mA and/or kV according to patient size and/or use of iterative reconstruction technique. COMPARISON:  12/07/2023 FINDINGS: Study is limited by patient motion throughout the exam. Alignment: Mild degenerative anterolisthesis of C6 on C7. Otherwise alignment is anatomic. Skull base and vertebrae: No acute fracture. No primary bone lesion or focal pathologic process. Soft tissues and spinal canal: No prevertebral fluid or swelling. No visible canal hematoma. Disc levels: Mild diffuse facet hypertrophy. Mild multilevel spondylosis greatest at C4-5 and C5-6. Upper chest: Airway is patent.  Bilateral upper lobe emphysema. Other: Reconstructed images demonstrate no additional findings. IMPRESSION: 1. Limited study due to patient motion. 2. No acute cervical spine fracture. Electronically Signed   By: Ozell Daring M.D.   On: 09/09/2024 20:46   CT Head Wo Contrast Result Date: 09/09/2024 CLINICAL DATA:  Clemens out of bed, history of lung cancer EXAM: CT HEAD WITHOUT CONTRAST TECHNIQUE: Contiguous axial images were obtained from the base of the skull through the vertex without intravenous contrast. RADIATION DOSE REDUCTION: This exam was performed according to the departmental dose-optimization program which includes automated exposure control, adjustment of the mA and/or kV according to patient size and/or use of iterative reconstruction technique. COMPARISON:  12/07/2023 FINDINGS: Brain: Assessment of the brain parenchyma is limited by patient motion throughout the study. No evidence of acute infarct or hemorrhage. The lateral ventricles and midline structures are unremarkable. No acute extra-axial fluid collections. No mass effect. Vascular: No hyperdense vessel or unexpected calcification. Skull: Normal. Negative for fracture or focal lesion. Sinuses/Orbits: No acute finding. Other: None. IMPRESSION: 1. Limited  study due to patient motion. 2. No acute intracranial process. Electronically Signed   By: Ozell Daring M.D.   On: 09/09/2024 20:39    Microbiology: Results for orders placed or performed during the hospital encounter of 09/09/24  Blood Culture (routine x 2)     Status: None   Collection Time: 09/09/24  8:50 PM   Specimen: Right Antecubital; Blood  Result Value Ref Range Status   Specimen Description RIGHT ANTECUBITAL  Final   Special Requests   Final    BOTTLES DRAWN AEROBIC AND ANAEROBIC Blood Culture results may not be optimal due to an inadequate volume  of blood received in culture bottles   Culture   Final    NO GROWTH 5 DAYS Performed at [facility], [address] [place], [place] [address]    Report Status 09/14/2024 FINAL  Final  Blood Culture (routine x 2)     Status: None   Collection Time: 09/09/24  8:50 PM   Specimen: Left Antecubital; Blood  Result Value Ref Range Status   Specimen Description LEFT ANTECUBITAL  Final   Special Requests   Final    BOTTLES DRAWN AEROBIC AND ANAEROBIC Blood Culture results may not be optimal due to an inadequate volume of blood received in culture bottles   Culture   Final    NO GROWTH 5 DAYS Performed at [facility], [address] [place], [place] [address]    Report Status 09/14/2024 FINAL  Final    Labs: CBC: Recent Labs  Lab 09/09/24 2004 09/10/24 0411  WBC 5.7 5.2  NEUTROABS 4.7  --   HGB 9.7* 10.1*  HCT 29.9* 29.6*  MCV 105.3* 100.3*  PLT 175 167   Basic Metabolic Panel: Recent Labs  Lab 09/09/24 2004 09/10/24 0411 09/11/24 1102 09/12/24 0320 09/13/24 0452  NA 135 137 135 134* 133*  K 3.5 3.2* 2.3* 3.3* 3.7  CL 100 102 99 97* 96*  CO2 25 23 25 28 29   GLUCOSE 88 88 99 84 88  BUN 15 14 15 14 17   CREATININE 1.04 0.91 0.94 0.91 0.90  CALCIUM  9.2 8.9 9.0 8.9 9.3  MG  --   --  2.3 2.3  --    Liver Function Tests: Recent Labs  Lab 09/09/24 2004  AST 26  ALT 9  ALKPHOS 102   BILITOT 0.5  PROT 7.2  ALBUMIN 3.2*    Discharge time spent: greater than 30 minutes.  Signed: Leita Blanch, MD [facility] 09/14/2024

## 2024-09-14 NOTE — TOC Transition Note (Signed)
 Transition of Care [facility]) - Discharge Note   Patient Details  Name: [name] MRN: [id number] Date of Birth: [birth year]  [facility] CM/SW Contact:  Alfonso Rummer, LCSW Phone Number: 09/14/2024, 1:06 PM   Clinical Narrative:    Pt will transition to [facility] center in [place]. Request for service form was completed to faxed to [facility]. Marrika with [facility] advised pt is not service connected therefore pt medicare a and b is his sports coach. Pt will transition to [employer] via [facility] to room 105.     Final next level of care: Skilled Nursing Facility Barriers to Discharge: Barriers Resolved   Patient Goals and CMS Choice     Choice offered to / list presented to : Patient, Adult Children      Discharge Placement PASRR number recieved: 09/14/24            Patient chooses bed at: Other - please specify in the comment section below: ([facility] [phone]  [address] [place]) Patient to be transferred to facility by: [facility] Name of family member notified: Rosaline and Devion Chriscoe Patient and family notified of of transfer: 09/14/24  Discharge Plan and Services Additional resources added to the After Visit Summary for                                       Social Drivers of Health (SDOH) Interventions SDOH Screenings   Food Insecurity: Patient Unable To Answer (09/10/2024)  Housing: Patient Unable To Answer (09/10/2024)  Transportation Needs: Patient Unable To Answer (09/10/2024)  Utilities: Patient Unable To Answer (09/10/2024)  Financial Resource Strain: Low Risk  (08/13/2023)   Received from [facility]  Social Connections: Patient Unable To Answer (09/10/2024)  Tobacco Use: High Risk (08/30/2024)     Readmission Risk Interventions    07/29/2023    3:36 PM  Readmission Risk Prevention Plan  Transportation Screening Complete  Medication Review (RN Care Manager)  Complete  PCP or Specialist appointment within 3-5 days of discharge Complete  SW Recovery Care/Counseling Consult Complete  Skilled Nursing Facility Complete

## 2024-09-14 NOTE — Plan of Care (Signed)

## 2024-10-06 ENCOUNTER — Ambulatory Visit: Admitting: Podiatry

## 2024-10-06 ENCOUNTER — Ambulatory Visit (INDEPENDENT_AMBULATORY_CARE_PROVIDER_SITE_OTHER)

## 2024-10-06 ENCOUNTER — Encounter: Admitting: Podiatry

## 2024-10-06 DIAGNOSIS — S92405D Nondisplaced unspecified fracture of left great toe, subsequent encounter for fracture with routine healing: Secondary | ICD-10-CM

## 2024-10-06 NOTE — Progress Notes (Signed)
 Subjective:   Patient ID: [name], male   DOB: 78 y.o.   MRN: [id number]   HPI Patient presents stating that the left big toe may have been fractured.  He is in very poor health and is in a nursing home and has caregiver with him today.  Patient smokes half a pack per day very thin and does not get out of wheelchair   Review of Systems  All other systems reviewed and are negative.       Objective:  Physical Exam Vitals and nursing note reviewed.  Constitutional:      Appearance: He is well-developed.  Pulmonary:     Effort: Pulmonary effort is normal.  Musculoskeletal:        General: Normal range of motion.  Skin:    General: Skin is warm.  Neurological:     Mental Status: He is alert.     Neurovascular status is diminished both DP PT pulses with a traumatized left big toe that is possibly fractured with nail damage.  Good digital perfusion not well oriented and has a lot of pain in general     Assessment:  Inflammatory condition keratotic tissue formation with possibility for fracture of the left hallux     Plan:  H&P x-rays reviewed and I discussed fracture of the big toe and I do not think there is any pathology here that we can do anything about and he is wearing an open toed shoe.  Continue open toed shoe will be seen back as needed

## 2024-11-14 DEATH — deceased
